# Patient Record
Sex: Female | Born: 1940 | Race: White | Hispanic: No | Marital: Married
Health system: Midwestern US, Community
[De-identification: ages and names within clinical notes are randomized; demographics above are authoritative.]

## PROBLEM LIST (undated history)

## (undated) DIAGNOSIS — Z981 Arthrodesis status: Secondary | ICD-10-CM

## (undated) DIAGNOSIS — M48061 Spinal stenosis, lumbar region without neurogenic claudication: Secondary | ICD-10-CM

## (undated) DIAGNOSIS — K219 Gastro-esophageal reflux disease without esophagitis: Secondary | ICD-10-CM

## (undated) DIAGNOSIS — K589 Irritable bowel syndrome without diarrhea: Secondary | ICD-10-CM

## (undated) DIAGNOSIS — D649 Anemia, unspecified: Secondary | ICD-10-CM

## (undated) DIAGNOSIS — M109 Gout, unspecified: Secondary | ICD-10-CM

## (undated) DIAGNOSIS — L509 Urticaria, unspecified: Secondary | ICD-10-CM

## (undated) DIAGNOSIS — H409 Unspecified glaucoma: Secondary | ICD-10-CM

## (undated) DIAGNOSIS — G4730 Sleep apnea, unspecified: Secondary | ICD-10-CM

## (undated) DIAGNOSIS — M1990 Unspecified osteoarthritis, unspecified site: Secondary | ICD-10-CM

## (undated) DIAGNOSIS — G894 Chronic pain syndrome: Secondary | ICD-10-CM

## (undated) DIAGNOSIS — G4700 Insomnia, unspecified: Secondary | ICD-10-CM

## (undated) DIAGNOSIS — I10 Essential (primary) hypertension: Secondary | ICD-10-CM

## (undated) DIAGNOSIS — R109 Unspecified abdominal pain: Secondary | ICD-10-CM

## (undated) DIAGNOSIS — C801 Malignant (primary) neoplasm, unspecified: Secondary | ICD-10-CM

## (undated) DIAGNOSIS — E569 Vitamin deficiency, unspecified: Secondary | ICD-10-CM

## (undated) DIAGNOSIS — K8020 Calculus of gallbladder without cholecystitis without obstruction: Secondary | ICD-10-CM

## (undated) DIAGNOSIS — E7800 Pure hypercholesterolemia, unspecified: Secondary | ICD-10-CM

## (undated) DIAGNOSIS — F209 Schizophrenia, unspecified: Secondary | ICD-10-CM

## (undated) DIAGNOSIS — J309 Allergic rhinitis, unspecified: Secondary | ICD-10-CM

## (undated) DIAGNOSIS — E669 Obesity, unspecified: Secondary | ICD-10-CM

## (undated) DIAGNOSIS — K5909 Other constipation: Secondary | ICD-10-CM

## (undated) DIAGNOSIS — K5900 Constipation, unspecified: Secondary | ICD-10-CM

## (undated) DIAGNOSIS — R110 Nausea: Secondary | ICD-10-CM

## (undated) DIAGNOSIS — F319 Bipolar disorder, unspecified: Secondary | ICD-10-CM

## (undated) DIAGNOSIS — H04123 Dry eye syndrome of bilateral lacrimal glands: Secondary | ICD-10-CM

## (undated) DIAGNOSIS — M4626 Osteomyelitis of vertebra, lumbar region: Secondary | ICD-10-CM

## (undated) DIAGNOSIS — R300 Dysuria: Secondary | ICD-10-CM

## (undated) HISTORY — DX: Nausea: R11.0

## (undated) HISTORY — PX: BACK SURGERY: SHX140

## (undated) HISTORY — DX: Pure hypercholesterolemia, unspecified: E78.00

## (undated) HISTORY — DX: Obesity, unspecified: E66.9

## (undated) HISTORY — DX: Constipation, unspecified: K59.00

## (undated) HISTORY — DX: Osteomyelitis of vertebra, lumbar region: M46.26

## (undated) HISTORY — PX: CHOLECYSTECTOMY: SHX55

## (undated) HISTORY — DX: Unspecified abdominal pain: R10.9

## (undated) HISTORY — DX: Irritable bowel syndrome, unspecified: K58.9

## (undated) HISTORY — PX: LEEP: SHX91

## (undated) HISTORY — DX: Unspecified glaucoma: H40.9

## (undated) HISTORY — PX: CARPAL TUNNEL RELEASE: SHX101

## (undated) HISTORY — PX: SKIN CANCER EXCISION: SHX779

## (undated) HISTORY — PX: TUBAL LIGATION: SHX77

## (undated) HISTORY — PX: ERCP: SHX60

## (undated) HISTORY — PX: BREAST SURGERY: SHX581

## (undated) HISTORY — DX: Calculus of gallbladder without cholecystitis without obstruction: K80.20

## (undated) HISTORY — PX: COLONOSCOPY W/ BIOPSIES: SHX1374

## (undated) NOTE — *Deleted (*Deleted)
[facility]  [address] [place],  [place]  [address] [phone]  Clinic Day:  01/26/2020  Referring physician: Paulina Fusi, MD   This document serves as a record of services personally performed by Gery Pray, MD. It was created on their behalf by [facility] E, a trained medical scribe. The creation of this record is based on the scribe's personal observations and the provider's statements to them.   CHIEF COMPLAINT:  CC: History of stage IA hormone receptor positive left breast cancer  Current Treatment:  Surveillance   HISTORY OF PRESENT ILLNESS:  Karen Castillo is a 25 y.o. 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  Biopsy revealed an infiltrating ductal carcinoma, tubular type.  Estrogen and progesterone receptors were positive and her 2 Neu negative.  She underwent excisional biopsy, but not a true lumpectomy in February 2013, and never had re-excision of the margins.  Pathology revealed a 5 mm, grade 1, lesion with 1 mm margin.  Re-excision and sentinel node biopsy was recommended, however she declined further surgery.  She was placed on letrozole 2.5 mg daily, however, was unable to tolerate this.  She is intolerant to many medications.  Bilateral diagnostic mammogram in December 2015 revealed postoperative scarring the left breast, as well as a new 3 mm benign appearing nodule in the right upper outer quadrant.  She had a repeat diagnostic right mammogram in July 2016, which was stable.  Bilateral diagnostic mammogram in November did not reveal any evidence of malignancy and follow-up in 1 year was recommended.  She had osteopenia on her last DEXA in in July 2016, which revealed a T-score of -1.8 in the femur, for which she is taking calcium and vitamin-D.  She had back surgery in February 2016, which did help her back pain.  She is here for routine  follow-up and states she has been doing fairly well.  She denies any changes in her breasts. She has persistent pain in her back and legs which she rates 7/10 today.  She states she also has labs done at Dr. Maurine Minister office, and we will request those.  She had her last mammogram on January 14, 2016.  She saw Dr. Charm Barges for Gastroenterology as she was having a lot of nausea and vomiting and inability to eat.  Upper endoscopy was okay and her last colonoscopy was in 2000. 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  She is doing better with that now, and did also have an ultrasound of the abdomen, which was negative.  She had told me that she had a chest mass 6-7 years ago which was evaluated at [facility] in [facility].  She later had a PET scan which was negative but has not had a repeat CT of the chest in years.  I ordered that to be done in June to follow up on this chest mass.  The CT scan confirmed a cyst along the right cardiophrenic angle measuring 3.6 cm, which is the same measurement as the previous report.  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    She is here for routine follow up and states that she is well and denies complaints.  She denies changes in either breast.  She rates her chronic back pain  as a 7/10, but denies pain at this time.  She follows up with Dr. Charm Barges for her GI issues.  Her appetite is good, but she has lost 7 pounds since her last visit.  She denies fever, or chills.  She denies nausea, vomiting, bowel issues, or abdominal pain.  She denies sore throat, cough, dyspnea, or chest pain.   INTERVAL HISTORY:  Illona is here for routine follow up ***.   Her  appetite is good, and she has gained/lost _ pounds since her last visit.  She denies fever, chills or other signs of infection.  She denies nausea, vomiting, bowel issues, or abdominal pain.   She denies sore throat, cough, dyspnea, or chest pain.   REVIEW OF SYSTEMS:  Review of Systems - Oncology   VITALS:  There were no vitals taken for this visit.  Wt Readings from Last 3 Encounters:  09/07/19 204 lb (92.5 kg)  08/23/19 204 lb 6 oz (92.7 kg)  07/21/19 204 lb (92.5 kg)    There is no height or weight on file to calculate BMI.  Performance status (ECOG): {CHL ONC [id number]  PHYSICAL EXAM:  Physical Exam  LABS:   CBC Latest Ref Rng & Units 07/21/2019 01/17/2019 01/16/2019  WBC 4.0 - 10.5 K/uL 6.6 8.1 8.2  Hemoglobin 12.0 - 15.0 g/dL 09.6 11.9(L) 11.6(L)  Hematocrit 36 - 46 % 37.1 36.2 35.9(L)  Platelets 150 - 400 K/uL 274 245 265   CMP Latest Ref Rng & Units 07/21/2019 01/17/2019 01/16/2019  Glucose 70 - 99 mg/dL 045(W) 97 098(J)  BUN 8 - 23 mg/dL 10 9 12   Creatinine 0.44 - 1.00 mg/dL 1.91 4.78 2.95  Sodium 135 - 145 mmol/L 133(L) 137 138  Potassium 3.5 - 5.1 mmol/L 3.7 3.9 3.7  Chloride 98 - 111 mmol/L 95(L) 103 105  CO2 22 - 32 mmol/L 25 24 22   Calcium 8.9 - 10.3 mg/dL 9.6 6.2(Z) 9.0  Total Protein 6.5 - 8.1 g/dL 7.0 6.5 -  Total Bilirubin 0.3 - 1.2 mg/dL 0.8 0.6 -  Alkaline Phos 38 - 126 U/L 66 82 -  AST 15 - 41 U/L 18 32 -  ALT 0 - 44 U/L 15 44 -     No results found for: CEA1 / No results found for: CEA1 No results found for: PSA1 No results found for: HYQ657 No results found for: QIO962  No results found for: TOTALPROTELP, ALBUMINELP, A1GS, A2GS, BETS, BETA2SER, GAMS, MSPIKE, SPEI No results found for: TIBC, FERRITIN, IRONPCTSAT No results found for: LDH   STUDIES:  No results found.   Allergies:  Allergies  Allergen Reactions  . Ciprofloxacin Swelling    Tongue swells  . Cyclobenzaprine Swelling    Feet swell   . Pregabalin Anaphylaxis, Shortness Of Breath, Swelling and Other (See Comments)    Dizziness also  . Fluvastatin Other (See Comments)    Leg pain   . Meloxicam Swelling    Feet became swollen  . Metaxalone Rash and Other  (See Comments)    Flushing of the skin and sore throat, too  . Pramipexole Nausea And Vomiting  . Pravastatin Other (See Comments)    Leg pain  . Rosuvastatin Other (See Comments)    Leg pain  . Sertraline Other (See Comments)    Sensitive teeth  . Statins Other (See Comments)    Leg pain     . Other     Darvocet  . Pravachol [Pravastatin Sodium]     Upper leg  pain  . Amitriptyline Other (See Comments)    Weird feeling all over  . Arthrotec [Diclofenac-Misoprostol] Diarrhea  . Augmentin [Amoxicillin-Pot Clavulanate] Diarrhea    Has patient had a PCN reaction causing immediate rash, facial/tongue/throat swelling, SOB or lightheadedness with hypotension: No Has patient had a PCN reaction causing severe rash involving mucus membranes or skin necrosis: No Has patient had a PCN reaction that required hospitalization: No Has patient had a PCN reaction occurring within the last 10 years: No If all of the above answers are "NO", then may proceed with Cephalosporin use.   . Bromfenac Diarrhea and Nausea Only  . Cephalexin Diarrhea and Nausea Only  . Conj Estrog-Medroxyprogest Ace Other (See Comments)    Unsure of reaction type Couldn't tolerate  . Diclofenac-Misoprostol Nausea Only and Diarrhea  . Erythromycin Diarrhea and Nausea Only  . Esomeprazole Diarrhea  . Esomeprazole Magnesium Nausea Only  . Levofloxacin Other (See Comments) and Nausea Only    Stomach upset  . Methadone Diarrhea  . Metoclopramide Diarrhea and Nausea Only  . Oxycodone Hives, Swelling and Rash  . Propoxyphene Diarrhea and Nausea Only  . Rofecoxib Diarrhea and Nausea Only  . Sulfur Diarrhea and Nausea Only  . Tramadol Diarrhea  . Zoloft [Sertraline Hcl] Other (See Comments)    Sensitivity with teeth    Current Medications: Current Outpatient Medications  Medication Sig Dispense Refill  . alendronate (FOSAMAX) 70 MG tablet Take 70 mg by mouth every Thursday. Take with a full glass of water on an empty  stomach.    Marland Kitchen allopurinol (ZYLOPRIM) 100 MG tablet Take 100 mg by mouth daily.    Marland Kitchen amLODipine (NORVASC) 10 MG tablet Take 10 mg by mouth daily.    Marland Kitchen ascorbic acid (VITAMIN C) 500 MG tablet Take 1,000 mg by mouth daily.    Marland Kitchen aspirin EC 81 MG tablet Take 81 mg by mouth daily.    . benzonatate (TESSALON) 100 MG capsule Take 100-200 mg by mouth every 8 (eight) hours as needed.    . Biotin 10 MG TABS Take 10 mg by mouth daily.    . calcium carbonate (OSCAL) 1500 (600 Ca) MG TABS tablet Take 600 mg of elemental calcium by mouth 3 (three) times daily with meals.    . Cholecalciferol (VITAMIN D3) 2000 units TABS Take 1 tablet by mouth daily.    . cloNIDine (CATAPRES) 0.1 MG tablet Take 0.1 mg by mouth at bedtime.    . cycloSPORINE (RESTASIS) 0.05 % ophthalmic emulsion Place 1 drop into both eyes 2 (two) times daily.     . fluconazole (DIFLUCAN) 100 MG tablet Take Fluconazole 200 mg for 1 day. Then followed by Fluconazole 100 mg daily for 10 days 12 tablet 0  . hydrochlorothiazide (HYDRODIURIL) 25 MG tablet Take 25 mg by mouth daily.    Marland Kitchen HYDROmorphone (DILAUDID) 2 MG tablet Take 2 mg by mouth in the morning, at noon, and at bedtime.     Marland Kitchen HYDROmorphone HCl (EXALGO) 8 MG TB24 Take 8 mg by mouth at bedtime.    . Lactobacillus (PROBIOTIC ACIDOPHILUS PO) Take 1 tablet by mouth daily.    . lansoprazole (PREVACID) 30 MG capsule Take 1 capsule (30 mg total) by mouth daily before breakfast. 30 minutes before breakfast 30 capsule 0  . latanoprost (XALATAN) 0.005 % ophthalmic solution Place 1 drop into both eyes at bedtime.    Marland Kitchen linaclotide (LINZESS) 290 MCG CAPS capsule Take 1 capsule (290 mcg total) by mouth daily before breakfast.  30 capsule 11  . LIVALO 2 MG TABS Take 1 tablet by mouth at bedtime.    . Magnesium (V-R MAGNESIUM) 250 MG TABS Take 250 mg by mouth daily.    . methocarbamol (ROBAXIN) 750 MG tablet Take 750 mg by mouth 3 (three) times daily.    . nadolol (CORGARD) 40 MG tablet Take 60 mg by mouth  daily.    Marland Kitchen NARCAN 4 MG/0.1ML LIQD nasal spray kit Place 4 mg into the nose. I spray in both nares PRN (Patient not taking: Reported on 08/23/2019)    . nitroGLYCERIN (NITROSTAT) 0.4 MG SL tablet Place 1 tablet (0.4 mg total) under the tongue every 5 (five) minutes as needed for chest pain. (Patient not taking: Reported on 09/07/2019) 30 tablet 0  . Omega-3 Fatty Acids (FISH OIL) 600 MG CAPS Take 600 mg by mouth 2 (two) times daily.    Marland Kitchen oxybutynin (DITROPAN) 5 MG tablet Take 5 mg by mouth 2 (two) times daily.    . polyethylene glycol (MIRALAX / GLYCOLAX) 17 g packet Take 17 g by mouth daily as needed for mild constipation.    Marland Kitchen spironolactone (ALDACTONE) 25 MG tablet Take 25 mg by mouth daily.    . sucralfate (CARAFATE) 1 GM/10ML suspension Take 10 mLs (1 g total) by mouth 4 (four) times daily -  with meals and at bedtime. 420 mL 0  . timolol (BETIMOL) 0.5 % ophthalmic solution Place 1 drop into both eyes daily. In the morning    . trazodone (DESYREL) 300 MG tablet Take 300 mg by mouth at bedtime.    Marland Kitchen trimethoprim (TRIMPEX) 100 MG tablet Take 100 mg by mouth daily.    . valsartan (DIOVAN) 80 MG tablet Take 80 mg by mouth daily.    . Wheat Dextrin (BENEFIBER) POWD Take 1 packet by mouth daily. Clear and Sugar FREE. 1 tablespoon daily     No current facility-administered medications for this visit.     ASSESSMENT & PLAN:   Assessment:   1. Stage IA left breast cancer,  over 8 years postop with no evidence of disease despite the fact that she had declined any further treatment after her excisional biopsy.  2. Osteopenia.  She will continue calcium 1200 mg daily and vitamin D3 2000 units daily.  I will recommend a repeat bone density scan in 1 year.  3. Chronic degenerative disc disease and chronic back pain.  She has had multiple(6) surgeries.  Her last 2 surgeries were May 2nd and June 20th 2019.  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  Plan: I can see her back  in 1 year with bilateral mammogram and bone density scan.  She understands and agrees with this plan of care.   I provided *** minutes (2:35 PM - 2:35 PM) of face-to-face time during this this encounter and > 50% was spent counseling as documented under my assessment and plan.    Dellia Beckwith, MD [facility] [address] [place] [address] Dept: [phone] Dept Fax: [phone]   I, Foye Deer, am acting as scribe for Dellia Beckwith, MD  I have reviewed this report as typed by the medical scribe, and it is complete and accurate.

---

## 2008-10-18 LAB — URINALYSIS W/ REFLEX CULTURE
Bacteria: NEGATIVE /HPF
Bilirubin: NEGATIVE
Blood: NEGATIVE
Glucose: NEGATIVE MG/DL
Ketone: NEGATIVE MG/DL
Leukocyte Esterase: NEGATIVE
Nitrites: NEGATIVE
Protein: NEGATIVE MG/DL
Specific gravity: 1.008 (ref 1.003–1.030)
Urobilinogen: 0.2 EU/DL (ref 0.2–1.0)
pH (UA): 6 (ref 5.0–8.0)

## 2008-10-18 LAB — METABOLIC PANEL, COMPREHENSIVE
A-G Ratio: 1.1 (ref 1.1–2.2)
ALT (SGPT): 28 U/L (ref 12–78)
AST (SGOT): 23 U/L (ref 15–37)
Albumin: 3.7 g/dL (ref 3.5–5.0)
Alk. phosphatase: 97 U/L (ref 50–136)
Anion gap: 10 mmol/L (ref 5–15)
BUN/Creatinine ratio: 10 — ABNORMAL LOW (ref 12–20)
BUN: 10 MG/DL (ref 6–20)
Bilirubin, total: 0.2 MG/DL (ref 0.2–1.0)
CO2: 30 MMOL/L (ref 21–32)
Calcium: 9.6 MG/DL (ref 8.5–10.1)
Chloride: 95 MMOL/L — ABNORMAL LOW (ref 97–108)
Creatinine: 1 MG/DL (ref 0.6–1.3)
GFR est AA: >60 mL/min/{1.73_m2} (ref >60)
GFR est non-AA: 59 mL/min/{1.73_m2} — ABNORMAL LOW (ref >60)
Globulin: 3.4 g/dL (ref 2.0–4.0)
Glucose: 110 MG/DL — ABNORMAL HIGH (ref 65–100)
Potassium: 4.4 MMOL/L (ref 3.5–5.1)
Protein, total: 7.1 g/dL (ref 6.4–8.2)
Sodium: 135 MMOL/L — ABNORMAL LOW (ref 136–145)

## 2008-10-18 LAB — CBC WITH AUTOMATED DIFF
ABS. BASOPHILS: 0 10*3/uL (ref 0.0–0.1)
ABS. EOSINOPHILS: 0.1 10*3/uL (ref 0.0–0.4)
ABS. LYMPHOCYTES: 2 10*3/uL (ref 0.8–3.5)
ABS. MONOCYTES: 0.6 10*3/uL (ref 0.0–1.0)
ABS. NEUTROPHILS: 5 10*3/uL (ref 1.8–8.0)
BASOPHILS: 0 % (ref 0–1)
EOSINOPHILS: 2 % (ref 0–7)
HCT: 38.8 % (ref 35.0–47.0)
HGB: 12.6 g/dL (ref 11.5–16.0)
LYMPHOCYTES: 26 % (ref 12–49)
MCH: 28.9 PG (ref 26.0–34.0)
MCHC: 32.5 g/dL (ref 30.0–36.5)
MCV: 89 FL (ref 80.0–99.0)
MONOCYTES: 8 % (ref 5–13)
NEUTROPHILS: 64 % (ref 32–75)
PLATELET: 350 10*3/uL (ref 150–400)
RBC: 4.36 M/uL (ref 3.80–5.20)
RDW: 13.6 % (ref 11.5–14.5)
WBC: 7.8 10*3/uL (ref 3.6–11.0)

## 2008-10-18 LAB — PTT: aPTT: 26.3 s (ref 24.0–33.0)

## 2008-10-18 LAB — PROTHROMBIN TIME + INR
INR: 1 (ref 0.9–1.1)
Prothrombin time: 10.1 SECS (ref 9.0–11.0)

## 2008-11-06 LAB — TYPE & SCREEN
ABO/Rh(D): O POS
Antibody screen: NEGATIVE

## 2008-11-06 LAB — TYPE AND SCREEN
ABO/Rh: O POS
Antibody Screen: NEGATIVE

## 2008-11-07 LAB — METABOLIC PANEL, BASIC
Anion gap: 11 mmol/L (ref 5–15)
BUN/Creatinine ratio: 8 — ABNORMAL LOW (ref 12–20)
BUN: 7 MG/DL (ref 6–20)
CO2: 27 MMOL/L (ref 21–32)
Calcium: 8.2 MG/DL — ABNORMAL LOW (ref 8.5–10.1)
Chloride: 96 MMOL/L — ABNORMAL LOW (ref 97–108)
Creatinine: 0.9 MG/DL (ref 0.6–1.3)
GFR est AA: >60 mL/min/{1.73_m2} (ref >60)
GFR est non-AA: >60 mL/min/{1.73_m2} (ref >60)
Glucose: 135 MG/DL — ABNORMAL HIGH (ref 65–100)
Potassium: 3.9 MMOL/L (ref 3.5–5.1)
Sodium: 134 MMOL/L — ABNORMAL LOW (ref 136–145)

## 2008-11-07 LAB — HGB & HCT
HCT: 30.4 % — ABNORMAL LOW (ref 35.0–47.0)
HGB: 10.1 g/dL — ABNORMAL LOW (ref 11.5–16.0)

## 2008-11-07 NOTE — Op Note (Addendum)
Name: Deanna Cortez, Deanna Cortez  MR #: [id number] Surgeon: Kem Parkinson, MD  Account #: [id number] Surgery Date: 11/06/2008  DOB: May 15, 1940  Age: 68 Location: 5W3 540 A     OPERATIVE REPORT      PREOPERATIVE DIAGNOSIS  1. Lumbar spinal fusion, L3-4.  2. Lumbar spondylosis, L4-5.  3. Lumbar stenosis at L2-3, L3-4, L4-5.    POSTOPERATIVE DIAGNOSIS  1. Lumbar spinal fusion, L3-4.  2. Lumbar spondylosis, L4-5.  3. Lumbar stenosis at L2-3, L3-4, L4-5.    OPERATIVE PROCEDURE  1. Lumbar laminectomy, L2-3, L3-4, L4-5.  2. Posterior lumbar fusion, L3-4, L4-5, posterior segmental  instrumentation, L3-4, L4-5.  3. Local autogenous bone graft, as well as bone morphogenic protein  allograft posterolaterally at L3-4 and L4-5.    SURGEON: Mindi Akerson S. Raylene Miyamoto, MD    ASSISTANT: Kerby Moors, PA-C    COMPLICATIONS: None.    DRAINS: One medium Hemovac.    ESTIMATED BLOOD LOSS: 300 mL.    SPECIMENS: None.    INDICATIONS: This is a pleasant 68 year old female with progressive back  pain, as well as neurogenic claudication. The patient has known spinal  stenosis from L2 down to L5. She has this for several years, treated  successfully with epidural steroid injections. This has become recently  less successful. Because of decreasing walking tolerance and increasing  left leg pain, she agreed to proceed with surgical intervention. The risks  and benefits of the procedure were discussed with her and her husband and  she has elected to proceed.    PROCEDURE: The patient was identified, brought to the operating suite,  underwent general anesthesia without difficulty. A Foley catheter was  placed. Ancef 2 gm were given to the patient preoperatively. Preoperative  neuromonitoring was placed, with the baselines obtained. The patient was  placed in the prone position on the Wilson frame with all bony prominences  well padded. The back was prepped and draped sterilely.    The back was entered through a midline incision at approximately the L2   level down to the S1 level. Dissection was carried down to the  thoracodorsal fascia. Hemostasis with bipolar cautery. The fascia was  released bilaterally, exposing the posterior lamina of L5, L4, L3 and  inferior portion of L2. X-ray was taken for localization of the appropriate  level. At that time, the facets were examined and posterolateral exposure  performed, with exposure of the L3, L4 and L5 transverse processes  bilaterally. Soft tissue was cleaned out of the lateral gutters in  preparation for the posterolateral fusion. Soft tissue was also removed off  the facets and these were found to be hypertrophic, as well as  significantly spondylitic and unstable, particularly at L4-5, with  significant synovial cyst fluid formation noted. At that time central  laminectomy was started, with removal of L5, L4 and L3 centrally at the  spinous processes. These were morselized, to be mixed with allograft.  Central laminectomy was started at the L5 level with #3 and #4 Kerrisons.  This was carried down through the L2-3 interval, with removal of the  intervening ligamentum flavum. Moderate central stenosis was noted. Severe  lateral recess stenosis was noted at L3-4 and L4-5 intervals bilaterally.  Careful dissection was carried out bilaterally, with undercutting of the  partial medial facets, as well as the superior articular process.  Pedicle-to-pedicle decompression was performed from the L3 all the way down  to the L5-S1 segment. This was done bilaterally. After satisfactory  decompression the wound was  irrigated with pulsed lavage with bacitracin  3000 mL.    Under direct visualization, using standard anatomical landmarks, the  enlarged pedicles of L3, L4 and L5 were all cannulated and were entered.  These were tested up to 20 mA with no breaches noted. This was also  palpated on all four walls, with no breaches noted with the pedicle probe.  X-rays were taken for localization of the pedicle, placed in the   appropriate alignment. Lateral gutters were decorticated. The mixture of  BMP, as well as the allograft/autograft mixture was placed into the lateral  gutters. Then, 6.5 x 45 mm screws were placed at L3, tested with no breach  at 20 mA and 6.5 x 40 mm screws were placed at L4 and L5, again, with no  breaches up to 12 mA. The 50-mm rods were bent to the appropriate lordosis,  attached to the pedicle screws and facet screws. Final tightening was  performed. One cross connector between the L3 and L4 level. Neuromonitoring  was stable. Final x-rays demonstrated stable fixation. Two medium Hemovacs  were placed.    Interrupted 1 Vicryl in the fascial layer, 2-0 Vicryl subcutaneous layer  and 3-0 Vicryl in the skin. The patient was extubated, was stable and  returned to PACU in stable condition.        E-Signed By  Kem Parkinson, MD 11/26/2008 20:32  Kem Parkinson, MD    cc: Kem Parkinson, MD        JV/WMX; D: 11/07/2008 7:58 A; T: 11/07/2008 10:31 A; Doc# [id number]; Job#  [id number]

## 2008-11-07 NOTE — Op Note (Signed)
Op Notes filed by Kem Parkinson, MD at 11/26/08 2034                Author: Kem Parkinson, MD  Service: --  Author Type: Physician       Filed: 11/26/08 2034  Date of Service: 11/07/08 1031  Status: Addendum          Editor: Kem Parkinson, MD (Physician)          <!--EPICS--> Name:      Deanna Cortez, Deanna Cortez<BR> MR #:      [id number]                    Surgeon:        Kem Parkinson, MD<BR> Account #: [id number]                  Surgery Date:   11/06/2008<BR> DOB:       January 30, 1941<BR> Age:       68                           Location:       5W3 540 A<BR> <BR>                              OPERATIVE REPORT<BR> <BR> <BR> PREOPERATIVE DIAGNOSIS<BR> 1. Lumbar spinal  fusion, L3-4.<BR> 2. Lumbar spondylosis, L4-5.<BR> 3. Lumbar stenosis at L2-3, L3-4, L4-5.<BR> <BR> POSTOPERATIVE DIAGNOSIS<BR> 1. Lumbar spinal fusion, L3-4.<BR> 2. Lumbar spondylosis, L4-5.<BR> 3. Lumbar stenosis at L2-3, L3-4, L4-5.<BR> <BR> OPERATIVE  PROCEDURE<BR> 1. Lumbar laminectomy, L2-3, L3-4, L4-5.<BR> 2. Posterior lumbar fusion, L3-4, L4-5, posterior segmental<BR> instrumentation, L3-4, L4-5.<BR> 3. Local autogenous bone graft, as well as bone morphogenic protein<BR> allograft posterolaterally  at L3-4 and L4-5.<BR> <BR> SURGEON:  Arthur Aydelotte S. Ashante Snelling, MD<BR> <BR> ASSISTANT: Kerby Moors, PA-C<BR> <BR> COMPLICATIONS:  None.<BR> <BR> DRAINS:  One medium Hemovac.<BR> <BR> ESTIMATED BLOOD LOSS:  300 mL.<BR> <BR> SPECIMENS:  None.<BR> <BR> INDICATIONS:   This is a pleasant 68 year old female with progressive back<BR> pain, as well as neurogenic claudication. The patient has known spinal<BR> stenosis from L2 down to L5. She has this for several years, treated<BR> successfully with epidural steroid injections.  This has become recently<BR> less successful. Because of decreasing walking tolerance and increasing<BR> left leg pain, she agreed to proceed with surgical intervention. The risks<BR> and benefits of the procedure were discussed  with her and her husband  and<BR> she has elected to proceed.<BR> <BR> PROCEDURE:  The patient was identified, brought to the operating suite,<BR> underwent general anesthesia without difficulty. A Foley catheter was<BR> placed. Ancef 2 gm were given to the patient preoperatively.  Preoperative<BR> neuromonitoring was placed, with the baselines obtained. The patient was<BR> placed in the prone position on the Wilson frame with all bony prominences<BR> well padded. The back was prepped and draped sterilely.<BR> <BR> The back was  entered through a midline incision at approximately the L2<BR> level down to the S1 level. Dissection was carried down to the<BR> thoracodorsal fascia. Hemostasis with bipolar cautery. The fascia was<BR> released bilaterally, exposing the posterior lamina  of L5, L4, L3 and<BR> inferior portion of L2. X-ray was taken for localization of the appropriate<BR> level. At that time, the facets were examined and posterolateral exposure<BR> performed, with exposure of the L3, L4 and L5 transverse processes<BR>  bilaterally. Soft tissue was cleaned out of the lateral gutters in<BR> preparation for the posterolateral fusion. Soft tissue was  also removed off<BR> the facets and these were found to be hypertrophic, as well as<BR> significantly spondylitic and unstable,  particularly at L4-5, with<BR> significant synovial cyst fluid formation noted. At that time central<BR> laminectomy was started, with removal of L5, L4 and L3 centrally at the<BR> spinous processes. These were morselized, to be mixed with allograft.<BR>  Central laminectomy was started at the L5 level with #3 and #4 Kerrisons.<BR> This was carried down through the L2-3 interval, with removal of the<BR> intervening ligamentum flavum. Moderate central stenosis was noted. Severe<BR> lateral recess stenosis  was noted at L3-4 and L4-5 intervals bilaterally.<BR> Careful dissection was carried out bilaterally, with undercutting of the<BR>  partial medial facets, as well as the superior articular process.<BR> Pedicle-to-pedicle decompression was performed from  the L3 all the way down<BR> to the L5-S1 segment. This was done bilaterally. After satisfactory<BR> decompression the wound was irrigated with pulsed lavage with bacitracin<BR> 3000 mL.<BR> <BR> Under direct visualization, using standard anatomical landmarks,  the<BR> enlarged pedicles of L3, L4 and L5 were all cannulated and were entered.<BR> These were tested up to 20 mA with no breaches noted. This was also<BR> palpated on all four walls, with no breaches noted with the pedicle probe.<BR> X-rays were taken  for localization of the pedicle, placed in the<BR> appropriate alignment. Lateral gutters were decorticated. The mixture of<BR> BMP, as well as the allograft/autograft mixture was placed into the lateral<BR> gutters. Then, 6.5 x 45 mm screws were placed  at L3, tested with no breach<BR> at 20 mA and 6.5 x 40 mm screws were placed at L4 and L5, again, with no<BR> breaches up to 12 mA. The 50-mm rods were bent to the appropriate lordosis,<BR> attached to the pedicle screws and facet screws. Final tightening  was<BR> performed. One cross connector between the L3 and L4 level. Neuromonitoring<BR> was stable. Final x-rays demonstrated stable fixation. Two medium Hemovacs<BR> were placed.<BR> <BR> Interrupted 1 Vicryl in the fascial layer, 2-0 Vicryl subcutaneous  layer<BR> and 3-0 Vicryl in the skin. The patient was extubated, was stable and<BR> returned to PACU in stable condition.<BR> <BR> <BR> <BR> E-Signed By<BR> Kem Parkinson, MD 11/26/2008 20:32<BR> Marcey Persad, MD<BR> <BR> cc:   Kem Parkinson,  MD<BR> <BR> <BR> <BR> JV/WMX; D: 11/07/2008  67:58 A; T: 11/07/2008 10:31 A; Doc# [id number]; Job#<BR> [id number]<KG> <!--EPICE-->

## 2008-11-08 LAB — METABOLIC PANEL, BASIC
Anion gap: 8 mmol/L (ref 5–15)
BUN/Creatinine ratio: 10 — ABNORMAL LOW (ref 12–20)
BUN: 6 MG/DL (ref 6–20)
CO2: 31 MMOL/L (ref 21–32)
Calcium: 8.3 MG/DL — ABNORMAL LOW (ref 8.5–10.1)
Chloride: 95 MMOL/L — ABNORMAL LOW (ref 97–108)
Creatinine: 0.6 MG/DL (ref 0.6–1.3)
GFR est AA: >60 mL/min/{1.73_m2} (ref >60)
GFR est non-AA: >60 mL/min/{1.73_m2} (ref >60)
Glucose: 112 MG/DL — ABNORMAL HIGH (ref 65–100)
Potassium: 3.5 MMOL/L (ref 3.5–5.1)
Sodium: 134 MMOL/L — ABNORMAL LOW (ref 136–145)

## 2008-11-08 LAB — HGB & HCT
HCT: 26.6 % — ABNORMAL LOW (ref 35.0–47.0)
HGB: 8.7 g/dL — ABNORMAL LOW (ref 11.5–16.0)

## 2008-11-10 NOTE — Consults (Addendum)
Name: Deanna Cortez, Deanna Cortez Admitted: 11/06/2008  MR #: [id number] DOB: September 20, 1940  Account #: [id number] Age: 68  Consultant: Felix Ahmadi, MD Location: [phone] [id number]     CONSULTATION REPORT    DATE OF CONSULTATION: 11/10/2008      ATTENDING PHYSICIAN: Jed S. Vanichkachorn, MD    REASON FOR CONSULTATION: We are being asked to render an opinion regarding  the patient's rehab needs.    CHIEF COMPLAINT: Back pain, leg cramps.    HISTORY OF PRESENT ILLNESS: The patient is a 68 year old white female with  chronic back pain for several years. She has had the gamut of treatments,  including physical therapy, oral medications, and steroid injections. She  has also received multiple steroid injections of the greater trochanter, as  this was initially thought to be the cause of her leg pains.The patient  reports that pain progressively worsened since January 2010, radiating to  both lower extremities, more on the left side. She denies any numbness or  weakness. No bowel or bladder changes. No associated trauma. Imaging  studies showed multilevel lumbosacral stenosis.  She was admitted to [facility] and on 11/06/2008, underwent L2 to  L5 laminectomy and instrumented posterior spinal fusion. Postoperative  course was significant for a severe headache, which has since resolved with  Percocet. She also had increased bilateral lower extremity cramps which are  still present, but improved this morning. She continues to have back pain  radiating to the lower extremities, but with less frequency and intensity.  Back pain is currently 4/10, described as deep and aching, and decreased  with medication. She does have decreased hemoglobin and hematocrit, but is  asymptomatic, and blood pressure has been stable.    PAST MEDICAL HISTORY: In addition to HPI, chronic low back pain,  headaches, hypertension, dyslipidemia, obstructive sleep apnea, on CPAP.  Depression, anxiety disorder, glaucoma, GERD, irritable bowel syndrome,   history of cervical cancer, history of skin cancer, carpal tunnel syndrome,  cholecystectomy.    FAMILY HISTORY: Positive for hypertension. Father had prostate cancer.    SOCIAL HISTORY: The patient lives with her husband in a 1-story home with  2 to 3 entry steps. She denies smoking, occasional intake of alcohol,  denies illicit drug use.    FUNCTIONAL HISTORY: Prior to admission, the patient independent in ADLs  and ambulated without an assistive device. As of 11/09/2008, lower body  ADLs with adaptive equipment. Minimal assistance, bed mobility, and  sit-to-stand transfers. Contact guard to minimal assistance, ambulated 35  feet x2 with a rolling walker, contact guard assistance.    ALLERGIES: MULTIPLE, INCLUDING AMITRIPTYLINE, CELEBREX, MOBIC, VIOXX,  DARVOCET, PRAVACHOL, SIMVASTATIN, LESCOL, CRESTOR, KEFLEX, AND AUGMENTIN,  ZOLOFT, ERYTHROMYCIN, METAXALONE, NEXIUM, PREMPRO.    HOME MEDICATIONS  1. Lisinopril 40 mg once daily.  2. Atenolol 100 mg once daily.  3. Triamterene/hydrochlorothiazide 37.5/25 mg once daily.  4. Omeprazole 20 mg b.i.d.  5. Ambien 10 mg daily.  6. Citalopram 40 mg daily.  7. Alprazolam 0.5 mg daily.  8. Probiotic 4 mg daily.  9. Oxycodone-APAP 5/325 mg 1 to 2 tablets daily as needed.  10. Estradiol 1 mg p.o. daily.    CURRENT MEDICATIONS  1. Bimatoprost 0.03% to both eyes at bedtime.  2. Timolol 0.5% to both eyes daily.  3. Estradiol 1 mg p.o. daily.  4. Flora-Q p.o. daily.  5. Alprazolam 0.5 mg p.o. daily.  6. Citalopram 40 mg p.o. daily.  7. Zolpidem 10 mg p.o. at bedtime.  8.  Pantoprazole 40 mg p.o. daily.  9. Triamterene/hydrochlorothiazide 37.5/25 mg p.o. daily.  10. Atenolol 100 mg p.o. at bedtime.  11. Lisinopril 40 mg p.o. daily.  12. Methocarbamol 1000 mg p.o. q.6 hours p.r.n.  13. Diazepam 5 mg p.o. q.6 hours p.r.n.  14. Enalapril 0.4 mg IV p.r.n.  15. Acetaminophen 325 to 650 mg p.o. q.4 hours p.r.n.  16. Temazepam 15 mg p.o. at bedtime p.r.n.   17. Bisacodyl suppository daily p.r.n.  18. Milk of magnesia p.o. p.r.n.  19. Metoclopramide 10 mg IV q.6 hours p.r.n.  20. Ondansetron 4 mg IV q.6 hours p.r.n.  21. Multivitamins with minerals daily p.r.n.  22. Senokot-S daily p.r.n.  23. Hydrocodone-APAP 5-10 mg p.o. q.3h. p.r.n.  24. Oxycodone 5 to 10 mg p.o. q.3 hours p.r.n.    REVIEW OF SYSTEMS: No fever or chills. No appetite the last few days, but  feels hungry this morning. Slept well for the first time last night after  zolpidem. No changes in vision. Denies difficulty swallowing. No chest pain  or shortness of breath. No abdominal pain, nausea, or vomiting, is passing  gas, and feels like having a bowel movement this morning. Denies dysuria or  hematuria. No history of bleeding recently. Depression and anxiety stable.  No skin rash. No significant weight loss, weight gain. Rest of review of  systems per HPI.    PHYSICAL EXAMINATION  GENERAL: The patient is alert, not in acute distress.  VITAL SIGNS: Blood pressure 151/69, pulse 74, respiratory 16, temperature  98 degrees Fahrenheit, on room air.  SKIN: Lumbar incision covered by dry, clean, dressing, with no drainage on  the dressing.  HEENT: Pupils equal, reactive to light. Anicteric sclerae. Oropharynx is  clear.  NECK: Supple.  LUNGS: Clear to auscultation bilaterally. No wheezing.  HEART: Regular rate and rhythm. No murmurs.  ABDOMEN: Soft, nontender, with normoactive bowel sounds.  PSYCHIATRIC: Times 3, mood and affect are appropriate. Cognition and  memory intact. Follows all verbal directives appropriately.  MUSCULOSKELETAL: No cervical or thoracic tenderness. Calves are soft. Very  mildly tender, but no rope-like mass or erythema. No lower extremity edema.  Range of motion within functional limits for all extremities.  NEUROLOGIC: Speech clear and fluent. Cranial nerves 2-12 intact. Sensory  intact to light touch in all extremities. Motor strength 5/5 bilateral,   upper and lower extremities, except hip flexors, 4+/5 with some pain  inhibition. Negative Babinski's, ankle clonus, or Hoffmann reflex. Tone is  normal.    DIAGNOSTICS: On 11/08/2008, hemoglobin 8.7, hematocrit 26.6, sodium 134,  potassium 3.5, chloride 95, carbon dioxide 31, glucose 112, BUN 6,  creatinine 0.6, calcium 8.3.    IMPRESSION  1. Nerve root dysfunction, secondary to multi-level lumbosacral stenosis,  status post L2 to L5 laminectomy, and posterior spinal fusion, 11/06/2008.  2. Acute blood-loss anemia.  3. Bilateral lower extremity cramps. May be related to postoperative  phenomenon, or ongoing, but improved, radiculitis.  4. Chronic low back pain.  5. Headaches. ? etiology.  6. Hypertension.  7. Dyslipidemia.  8. Obstructive sleep apnea, on CPAP.  9. Depression and anxiety disorder.  10. Glaucoma.  11. Gastroesophageal reflux disease.  12. Irritable bowel syndrome.    RECOMMENDATIONS  1. The patient is appropriate for acute inpatient rehabilitation to  maximize function, and manage medical comorbidities. The goal is for her to  obtain functional level of supervision to modify the independence in ADLs,  transfers, and ambulation. Expected length of stay is about 5 to 7 days.  Various rehab options and programs discussed with the patient and her  husband.  2. Discontinue temazepam as zolpidem has been effective for sleep  regulation.    Thank you for this consultation.    Discussed with case management, therapy and nursing staff .            Reviewed on 11/10/2008 1:21 PM        E-Signed By  Felix Ahmadi, MD 11/10/2008  13:41    Felix Ahmadi, MD    cc: Felix Ahmadi, MD        MCL/WMX; D: 11/10/2008 9:35 A; T: 11/10/2008 11:25 A; DOC# [id number]; Job#  [id number]

## 2008-11-11 LAB — METABOLIC PANEL, BASIC
Anion gap: 10 mmol/L (ref 5–15)
BUN/Creatinine ratio: 9 — ABNORMAL LOW (ref 12–20)
BUN: 8 MG/DL (ref 6–20)
CO2: 30 MMOL/L (ref 21–32)
Calcium: 8.5 MG/DL (ref 8.5–10.1)
Chloride: 92 MMOL/L — ABNORMAL LOW (ref 97–108)
Creatinine: 0.9 MG/DL (ref 0.6–1.3)
GFR est AA: >60 mL/min/{1.73_m2} (ref >60)
GFR est non-AA: >60 mL/min/{1.73_m2} (ref >60)
Glucose: 96 MG/DL (ref 65–100)
Potassium: 3.7 MMOL/L (ref 3.5–5.1)
Sodium: 132 MMOL/L — ABNORMAL LOW (ref 136–145)

## 2008-11-11 LAB — CBC W/O DIFF
HCT: 30.4 % — ABNORMAL LOW (ref 35.0–47.0)
HGB: 9.9 g/dL — ABNORMAL LOW (ref 11.5–16.0)
MCH: 28.4 PG (ref 26.0–34.0)
MCHC: 32.6 g/dL (ref 30.0–36.5)
MCV: 87.1 FL (ref 80.0–99.0)
PLATELET: 393 10*3/uL (ref 150–400)
RBC: 3.49 M/uL — ABNORMAL LOW (ref 3.80–5.20)
RDW: 13.4 % (ref 11.5–14.5)
WBC: 9.7 10*3/uL (ref 3.6–11.0)

## 2008-11-12 LAB — URINALYSIS W/ RFLX MICROSCOPIC
Bilirubin: NEGATIVE
Blood: NEGATIVE
Glucose: NEGATIVE MG/DL
Ketone: NEGATIVE MG/DL
Nitrites: POSITIVE — AB
Protein: NEGATIVE MG/DL
Specific gravity: 1.008 (ref 1.003–1.030)
Urobilinogen: 0.2 EU/DL (ref 0.2–1.0)
pH (UA): 6.5 (ref 5.0–8.0)

## 2008-11-13 LAB — METABOLIC PANEL, BASIC
Anion gap: 7 mmol/L (ref 5–15)
BUN/Creatinine ratio: 9 — ABNORMAL LOW (ref 12–20)
BUN: 7 MG/DL (ref 6–20)
CO2: 31 MMOL/L (ref 21–32)
Calcium: 8.2 MG/DL — ABNORMAL LOW (ref 8.5–10.1)
Chloride: 95 MMOL/L — ABNORMAL LOW (ref 97–108)
Creatinine: 0.8 MG/DL (ref 0.6–1.3)
GFR est AA: >60 mL/min/{1.73_m2} (ref >60)
GFR est non-AA: >60 mL/min/{1.73_m2} (ref >60)
Glucose: 86 MG/DL (ref 65–100)
Potassium: 4.2 MMOL/L (ref 3.5–5.1)
Sodium: 133 MMOL/L — ABNORMAL LOW (ref 136–145)

## 2008-11-14 LAB — CULTURE, URINE
Colonies Counted: >100000
Colony Count: >100000

## 2008-11-15 LAB — CULTURE, WOUND W GRAM STAIN: Culture result:: NO GROWTH

## 2008-11-17 LAB — METABOLIC PANEL, BASIC
Anion gap: 7 mmol/L (ref 5–15)
BUN/Creatinine ratio: 10 — ABNORMAL LOW (ref 12–20)
BUN: 9 MG/DL (ref 6–20)
CO2: 31 MMOL/L (ref 21–32)
Calcium: 8.8 MG/DL (ref 8.5–10.1)
Chloride: 97 MMOL/L (ref 97–108)
Creatinine: 0.9 MG/DL (ref 0.6–1.3)
GFR est AA: >60 mL/min/{1.73_m2} (ref >60)
GFR est non-AA: >60 mL/min/{1.73_m2} (ref >60)
Glucose: 92 MG/DL (ref 65–100)
Potassium: 4.5 MMOL/L (ref 3.5–5.1)
Sodium: 135 MMOL/L — ABNORMAL LOW (ref 136–145)

## 2008-11-17 LAB — MAGNESIUM: Magnesium: 2.1 MG/DL (ref 1.6–2.4)

## 2008-11-17 LAB — CBC W/O DIFF
HCT: 28.9 % — ABNORMAL LOW (ref 35.0–47.0)
HGB: 9.2 g/dL — ABNORMAL LOW (ref 11.5–16.0)
MCH: 28 PG (ref 26.0–34.0)
MCHC: 31.8 g/dL (ref 30.0–36.5)
MCV: 87.8 FL (ref 80.0–99.0)
PLATELET: 566 10*3/uL — ABNORMAL HIGH (ref 150–400)
RBC: 3.29 M/uL — ABNORMAL LOW (ref 3.80–5.20)
RDW: 14 % (ref 11.5–14.5)
WBC: 8.9 10*3/uL (ref 3.6–11.0)

## 2009-01-04 NOTE — Discharge Summary (Addendum)
Name: Deanna Cortez, Deanna Cortez Admitted: 11/06/2008  MR #: [id number] Discharged: 11/10/2008  Account #: [id number] DOB: [birth year]  Physician: Kem Parkinson, MD Age 68     DISCHARGE SUMMARY    ADMISSION DIAGNOSES  1. 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  2. Lumbar spondylosis L4-5.    DISCHARGE DIAGNOSES  1. 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  2. Lumbar spondylosis L4-5.    OPERATIVE PROCEDURES  1. Lumbar laminectomy L2-3, L3-4, L4-5.  2. Posterior lumbar fusion at L3-4, L4-5.  3. Posterior segmental instrumentation L3 to L5.  4. Local autogenous bone graft, as well as BNP.    SURGEON: Kem Parkinson, MD.    HISTORY/HOSPITAL COURSE: Patient is a pleasant 68 year old female who has  complaints of progressive lower back pain, as well as neurogenic  claudication. Having failed conservative treatment, the patient has elected  to undergo operative intervention. She underwent the above mentioned  procedures and tolerated them well. She was admitted postoperatively for  antibiotic treatment and pain control. The patient did well overnight into  the first postoperative day. She had no complaints on postop day 1. She was  afebrile and her vital signs were stable. Hemovac output was about 170 mL  and so it was kept in place. IV antibiotics were continued. The patient's  hemoglobin was stable at 10.1. Her incision was clean, dry and intact. She  was allowed to start dangling on the edge of the bed with physical therapy.  On postoperative day 2 the patient continued to be fairly well. Hemovac  output was about 50 mL. Hemoglobin had dropped to 8.7, but otherwise was  stable and the patient was asymptomatic. She had no active pain and was  doing fairly well. She was allowed to start getting out of bed with  physical therapy. On postop day 2 patient was complaining of some spasm,  but otherwise had no complaints. She continued working with the physical  therapist. Her diet was advanced as tolerated. Her morphine PCA was taken   away once she was tolerating a regular diet. On postoperative day 4 she was  deemed ready for discharge. It was decided that she would benefit from  inpatient rehab. She was accepted at [facility]. She was  discharged there with prescriptions for Percocet for pain and Phenergan for  nausea. She will wear her brace at all times when out of bed. She will  follow up in 10 to 14 days for repeat x-rays and wound check.          E-Signed By  Kem Parkinson, MD 01/04/2009 22:24    Kem Parkinson, MD    cc: Kem Parkinson, MD    JV/WMX; D: 01/03/2009 11:20 P; T: 01/04/2009 2:34 P; DOC# [id number]; Job#  [id number]

## 2010-03-26 DIAGNOSIS — Q248 Other specified congenital malformations of heart: Secondary | ICD-10-CM | POA: Insufficient documentation

## 2010-03-26 HISTORY — DX: Other specified congenital malformations of heart: Q24.8

## 2011-03-11 HISTORY — PX: BREAST BIOPSY: SHX20

## 2011-04-24 DIAGNOSIS — C50912 Malignant neoplasm of unspecified site of left female breast: Secondary | ICD-10-CM | POA: Insufficient documentation

## 2011-04-24 HISTORY — DX: Malignant neoplasm of unspecified site of left female breast: C50.912

## 2013-04-20 DIAGNOSIS — M5430 Sciatica, unspecified side: Secondary | ICD-10-CM

## 2013-04-20 LAB — CBC W/O DIFF
HCT: 37.2 % (ref 35.0–47.0)
HGB: 11.9 g/dL (ref 11.5–16.0)
MCH: 28.3 PG (ref 26.0–34.0)
MCHC: 32 g/dL (ref 30.0–36.5)
MCV: 88.4 FL (ref 80.0–99.0)
PLATELET: 299 10*3/uL (ref 150–400)
RBC: 4.21 M/uL (ref 3.80–5.20)
RDW: 14.5 % (ref 11.5–14.5)
WBC: 7.7 10*3/uL (ref 3.6–11.0)

## 2013-04-20 LAB — METABOLIC PANEL, BASIC
Anion gap: 7 mmol/L (ref 5–15)
BUN/Creatinine ratio: 29 — ABNORMAL HIGH (ref 12–20)
BUN: 17 MG/DL (ref 6–20)
CO2: 32 mmol/L (ref 21–32)
Calcium: 9 MG/DL (ref 8.5–10.1)
Chloride: 97 mmol/L (ref 97–108)
Creatinine: 0.59 MG/DL (ref 0.45–1.15)
GFR est AA: >60 mL/min/{1.73_m2} (ref >60)
GFR est non-AA: >60 mL/min/{1.73_m2} (ref >60)
Glucose: 114 mg/dL — ABNORMAL HIGH (ref 65–100)
Potassium: 3.6 mmol/L (ref 3.5–5.1)
Sodium: 136 mmol/L (ref 136–145)

## 2013-04-20 LAB — URINALYSIS W/ REFLEX CULTURE
Bilirubin: NEGATIVE
Blood: NEGATIVE
Glucose: NEGATIVE mg/dL
Ketone: NEGATIVE mg/dL
Nitrites: NEGATIVE
Protein: NEGATIVE mg/dL
Specific gravity: 1.012 (ref 1.003–1.030)
Urobilinogen: 0.2 EU/dL (ref 0.2–1.0)
pH (UA): 6.5 (ref 5.0–8.0)

## 2013-04-20 LAB — TYPE & SCREEN
ABO/Rh(D): O POS
Antibody screen: NEGATIVE

## 2013-04-20 LAB — EKG, 12 LEAD, INITIAL
Atrial Rate: 68 {beats}/min
Calculated P Axis: 6 degrees
Calculated R Axis: 54 degrees
Calculated T Axis: 17 degrees
P-R Interval: 178 ms
Q-T Interval: 434 ms
QRS Duration: 100 ms
QTC Calculation (Bezet): 461 ms
Ventricular Rate: 68 {beats}/min

## 2013-04-20 LAB — PROTHROMBIN TIME + INR
INR: 1 (ref 0.9–1.1)
Prothrombin time: 10 s (ref 9.0–11.1)

## 2013-04-20 LAB — HEMOGLOBIN A1C WITH EAG
Est. average glucose: 160 mg/dL
Hemoglobin A1c: 7.2 % — ABNORMAL HIGH (ref 4.2–6.3)

## 2013-04-20 LAB — TYPE AND SCREEN
ABO/Rh: O POS
Antibody Screen: NEGATIVE

## 2013-04-20 NOTE — Other (Signed)
PATIENT DENIES TRAVEL TO [place] OR ANY AREA WHERE EVD TRANSMISSION HAS BEEN REPORTED BY WHO AND DENIES CLOSE CONTACT WITH ANYONE TRAVELING TO THOSE AREAS IN THE PAST 21 DAYS.PATIENT GIVEN SURGICAL SITE INFECTION FAQ HANDOUT AND HAND WASHING TIP SHEET. PREOP INSTRUCTIONS REVIEWED AND PATIENT VERBALIZES UNDERSTANDING OF INSTRUCTIONS. PATIENT HAS BEEN GIVEN THE OPPORTUNITY TO ASK ADDITIONAL QUESTIONS.PREOPERATIVE INSTRUCTIONS REVIEWED WITH PATIENT. PATIENT GIVEN SIX PACK OF CHG WIPES. INSTRUCTIONS (REVIEWED IN CLASS) ON USE OF CHG WIPES. PATIENT GIVEN SSI INFECTION FAQ SHEET, INFORMATION SHEET ON DIABETIC TREATMENT CENTER AS WELL AS HAND WASHING TIPS SHEETS. MRSA/MSSA TREATMENT INSTRUCTION SHEET GIVEN WITH EXPLANATION TO PATIENT THAT THEY WILL BE NOTIFIED IF TREATMENT INSTRUCTIONS NEED TO BE INITIATED. PATIENT WAS GIVEN THE OPPORTUNITY TO ASK QUESTIONS ON THE INFORMATION PROVIDED.

## 2013-04-20 NOTE — Other (Signed)
CLASS PT; LABS DRAWN PRIOR TO CLASS.

## 2013-04-21 LAB — CULTURE, MRSA

## 2013-04-21 NOTE — Other (Signed)
LABS CALLED AND FAXED TO [facility], DR. VANICHKACHORN'S NURSE. [facility]TC REFERRAL PLACED FOR A1C 7.2.

## 2013-04-22 LAB — CULTURE, URINE
Colonies Counted: 40000
Colony Count: 40000

## 2013-04-25 NOTE — Other (Signed)
FAXED PRE-ADMISSION TESTING REPORTS (AND FAX CONFIRMATION RECEIVED TO DR. VANICHKACHORN CALLED ON 04/25/13  AT [phone]  AND LEFT MESSAGE ON [facility]'S VOICEMAIL, RE: ABNORMAL URINE, RESULT KLEBSIELLA AND MIXED FLORA

## 2013-04-27 NOTE — H&P (Addendum)
Deanna Cortez  Location: [facility]  Patient #: [id number]  DOB: Mar 05, 1941  Married / Language: English / Race: Black or African American, White  Female      ??  History of Present Illness??  ??????????????????The patient is a 73 year old female who presents for a recheck of Acute lumbar back pain. The last clinic visit was 3 week(s) ago. Symptoms include pain. Symptoms are located symmetrically. The pain radiates to the left lower leg and right lower leg. The patient describes the pain as sharp and aching. Onset was gradual 6 month(s) ago. The symptoms occur constantly. The patient describes this pain as severe and worsening. The patient was previously evaluated in this clinic 3 week(s) ago. Presenting symptoms included back pain. Past treatment has included back surgery. Note for "Acute lumbar back pain": Ms. Deanna Cortez returns today with continued complaints of back and bilateral leg pain. ??She states that this is primarily from the posterior hip around into her thighs. ??She states that it does not extend below her knee much. ??She has some difficulty walking and moving around due to this. ??He states this is been ongoing for the past several months. ??She has had a lumbar fusion for years ago and was doing fairly well before this. ??She denies any accidents or injuries. ??She has undergone MRI today is here for results. ??She has been taking I gabapentin, as well as some hydrocodone for her symptoms. ??She is requesting hydrocodone refill.      ??  ??  Allergies  Duract *ANALGESICS - ANTI-INFLAMMATORY*  Augmentin *PENICILLINS*  Cephalexin *CEPHALOSPORINS*  NexIUM *ULCER DRUGS*  Erythromycins  TraMADol HCl *ANALGESICS - OPIOID*  Arthrotec *ANALGESICS - ANTI-INFLAMMATORY*  Reglan *GASTROINTESTINAL AGENTS - MISC.*  Sulfa Drugs  Darvocet-N 100 *ANALGESICS - OPIOID*  Levaquin *Fluoroquinolones**  Vioxx *ANALGESICS - ANTI-INFLAMMATORY*  Cipro *FLUOROQUINOLONES*  Meloxicam *ANALGESICS - ANTI-INFLAMMATORY*  Lescol  *ANTIHYPERLIPIDEMICS*  Pravachol *ANTIHYPERLIPIDEMICS*  Prempro *ESTROGENS*  Crestor *ANTIHYPERLIPIDEMICS*  Simvastatin *ANTIHYPERLIPIDEMICS*  Zoloft *ANTIDEPRESSANTS*  Amitriptyline HCl *ANTIDEPRESSANTS*  Metaxalone *MUSCULOSKELETAL THERAPY AGENTS*  Flexeril *MUSCULOSKELETAL THERAPY AGENTS*  Lyrica *ANTICONVULSANTS*    ??  ??  Medication History  Medications Reconciled.    ??  ??  Review of Systems??  Musculoskeletal:??Present- Back Pain. Not Present- Joint Pain, Joint Stiffness and Joint Swelling.    ??  ??  Physical Exam  The physical exam findings are as follows:    ??  Neurologic??  Sensory:??  Light Touch:??Intact??- Globally.  Overall Assessment of Muscle Strength and Tone reveals:  Lower Extremities:??Right Iliopsoas??- 5/5. Left Iliopsoas??- 5/5. Right Tibialis Anterior??- 5/5. Left Tibialis Anterior??- 5/5. Right Gastroc-Soleus??- 5/5. Left Gastroc-Soleus??- 5/5. Right EHL??- 5/5. Left EHL??- 5/5.  General Assessment of Reflexes: Right Ankle??- Clonus is not present. Left Ankle??- Clonus is not present.  Reflexes (Dermatomes):??2/2 Normal??- Left Achilles (L5-S2), Left Knee (L2-4), Right Achilles (L5-S2) and Right Knee (L2-4).      Musculoskeletal  Global Assessment??  Examination of related systems reveals - well-developed, well-nourished, in no acute distress, alert and oriented x 3. Gait and Station??- normal gait and station and normal posture. Right Lower Extremity??- normal strength and tone, normal range of motion without pain and no instability, subluxation or laxity. Left Lower Extremity??- normal strength and tone, normal range of motion without pain and no instability, subluxation or laxity.  Spine/Ribs/Pelvis??  Cervical Spine:??Examination of the cervical spine reveals - no tenderness to palpation, no pain, no swelling, edema or erythema, normal cervical spine movements and normal sensation.  Thoracic (  Dorsal) Spine:??Examination of the thoracic spine reveals - no tenderness over thoracic vertebrae, no pain, normal sensation  and normal thoracic spine movements.  Lumbosacral Spine:??Examination of the lumbosacral spine reveals - no known fractures or deformities.  Inspection and Palpation:??Tenderness??- moderate.  Assessment of pain reveals the following findings: The pain is characterized as - moderate. Location??- pain refers to lower back bilaterally.  ROJM:??Trunk Extension??- 15 degrees. Lumbar Spine Flexion??- 35 ??.  Functional Testing??- Babinski Test negative, Prone Knee Bending Test negative, Slump Test negative and Straight Leg Raising Test negative.      ??  Assessment & Plan??  SCIATICA (724.3)  Impression: The patient will continue with conservative care. She did have about 3 weeks of improvement with the last set of injections. We will restart those. If she does not get improvement I do think she might be a good candidate for a indirect decompression with lateral interbody fusion at L2-3. She'll let us know how the injections well.  Current Plans  ????l????  Referred to Pain Mgmt.    Risks and benefits of surgery were discussed with the patient and she would like to proceed with surgery.  ??  LOW BACK PAIN (724.2)  ??  DDD (722.52)  ??  REVIEW OF SYSTEMS: Systems were reviewed by the provider. (V49.9)  ??  ??  ????Date of Surgery Update:  Deanna Cortez was seen and examined.  History and physical has been reviewed. There have been no significant clinical changes since the completion of the originally dated History and Physical.    Signed By: Lorenz Coaster, PA-C     May 04, 2013 7:48 AM

## 2013-05-04 ENCOUNTER — Inpatient Hospital Stay
Admit: 2013-05-04 | Discharge: 2013-05-05 | Disposition: A | Discharge status: Home / Self Care | Payer: MEDICARE | Attending: Specialist | Admitting: Specialist

## 2013-05-04 LAB — GLUCOSE, POC: Glucose (POC): 133 mg/dL — ABNORMAL HIGH (ref 65–105)

## 2013-05-04 MED ADMIN — rocuronium (ZEMURON) injection: INTRAVENOUS | @ 15:00:00 | NDC 10139023505

## 2013-05-04 MED ADMIN — lidocaine (PF) (XYLOCAINE) 20 mg/mL (2 %) injection: INTRAVENOUS | @ 14:00:00 | NDC 63323049507

## 2013-05-04 MED ADMIN — lactated ringers infusion: INTRAVENOUS | @ 16:00:00 | NDC 00338011704

## 2013-05-04 MED ADMIN — 0.9% sodium chloride infusion: INTRAVENOUS | @ 17:00:00 | NDC 00409798309

## 2013-05-04 MED ADMIN — morphine (PF) PCA 150 mg/30 ml: INTRAVENOUS | @ 18:00:00 | NDC 00409602804

## 2013-05-04 MED ADMIN — gabapentin (NEURONTIN) capsule 300 mg: ORAL | @ 22:00:00 | NDC 68084076211

## 2013-05-04 MED ADMIN — morphine injection: INTRAVENOUS | @ 15:00:00 | NDC 00409126130

## 2013-05-04 MED ADMIN — fentaNYL citrate (PF) injection: INTRAVENOUS | @ 14:00:00 | NDC 00409909332

## 2013-05-04 MED ADMIN — midazolam (VERSED) injection: INTRAVENOUS | @ 14:00:00 | NDC 25021065502

## 2013-05-04 MED ADMIN — bacitracin 50,000 Units in sodium chloride irrigation 0.9 % 1,000 mL Irrigation: @ 15:00:00 | NDC 00409713809

## 2013-05-04 MED ADMIN — rocuronium (ZEMURON) injection: INTRAVENOUS | @ 14:00:00 | NDC 10139023505

## 2013-05-04 MED ADMIN — acetaminophen (TYLENOL) tablet 650 mg: ORAL | @ 19:00:00 | NDC 50580050110

## 2013-05-04 MED ADMIN — propofol (DIPRIVAN) 10 mg/mL injection: INTRAVENOUS | @ 16:00:00 | NDC 63323026920

## 2013-05-04 MED ADMIN — dexamethasone (DECADRON) 4 mg/mL injection: INTRAVENOUS | @ 14:00:00 | NDC 63323016501

## 2013-05-04 MED ADMIN — succinylcholine (ANECTINE) injection: INTRAVENOUS | @ 14:00:00 | NDC 00409662902

## 2013-05-04 MED ADMIN — ondansetron (ZOFRAN) injection: INTRAVENOUS | @ 15:00:00 | NDC 23155037831

## 2013-05-04 MED ADMIN — lactated ringers infusion: INTRAVENOUS | @ 14:00:00 | NDC 00338011704

## 2013-05-04 MED ADMIN — propofol (DIPRIVAN) 10 mg/mL injection: INTRAVENOUS | @ 14:00:00 | NDC 63323026920

## 2013-05-04 MED ADMIN — morphine (PF) PCA 150 mg/30 ml: INTRAVENOUS | @ 17:00:00 | NDC 00409602804

## 2013-05-04 MED ADMIN — prazosin (MINIPRESS) capsule 2 mg: ORAL | @ 22:00:00 | NDC 51079063001

## 2013-05-04 MED ADMIN — morphine injection: INTRAVENOUS | @ 16:00:00 | NDC 00409126130

## 2013-05-04 MED ADMIN — glycopyrrolate (ROBINUL) injection: INTRAVENOUS | @ 16:00:00 | NDC 00143968201

## 2013-05-04 MED ADMIN — pantoprazole (PROTONIX) tablet 40 mg: ORAL | @ 22:00:00 | NDC 68084081311

## 2013-05-04 MED ADMIN — metoprolol (LOPRESSOR) injection: INTRAVENOUS | @ 14:00:00 | NDC 00143987310

## 2013-05-04 MED ADMIN — thrombin (THROMBOGEN) topical solution: TOPICAL | @ 15:00:00 | NDC 60793021720

## 2013-05-04 MED ADMIN — gelatin absorbable (GELFOAM) 100 sponge: TOPICAL | @ 15:00:00 | NDC 00009034201

## 2013-05-04 MED ADMIN — fentaNYL citrate (PF) injection 25 mcg: INTRAVENOUS | @ 18:00:00 | NDC 00641602701

## 2013-05-04 MED ADMIN — ceFAZolin in 0.9% NS (ANCEF) IVPB soln 2 g: INTRAVENOUS | @ 14:00:00 | NDC 09999966850

## 2013-05-04 MED ADMIN — ceFAZolin (ANCEF) 3 g in 0.9% NS 100 ml IVPB: INTRAVENOUS | @ 22:00:00 | NDC 09999051801

## 2013-05-04 MED ADMIN — neostigmine (PROSTIGMINE) injection: INTRAVENOUS | @ 16:00:00 | NDC 00517003325

## 2013-05-04 MED FILL — CEFAZOLIN 3G IN 100 ML 0.9% NS PREMIX: 3 gram/100ml | INTRAVENOUS | Qty: 100

## 2013-05-04 MED FILL — PRAZOSIN 1 MG CAP: 1 mg | ORAL | Qty: 2

## 2013-05-04 MED FILL — DIPRIVAN 10 MG/ML INTRAVENOUS EMULSION: 10 mg/mL | INTRAVENOUS | Qty: 1105

## 2013-05-04 MED FILL — ONDANSETRON (PF) 4 MG/2 ML INJECTION: 4 mg/2 mL | INTRAMUSCULAR | Qty: 2

## 2013-05-04 MED FILL — PANTOPRAZOLE 40 MG TAB, DELAYED RELEASE: 40 mg | ORAL | Qty: 1

## 2013-05-04 MED FILL — METOPROLOL TARTRATE 5 MG/5 ML IV SOLN: 5 mg/ mL | INTRAVENOUS | Qty: 5

## 2013-05-04 MED FILL — FENTANYL CITRATE (PF) 50 MCG/ML IJ SOLN: 50 mcg/mL | INTRAMUSCULAR | Qty: 5

## 2013-05-04 MED FILL — GABAPENTIN 300 MG CAP: 300 mg | ORAL | Qty: 1

## 2013-05-04 MED FILL — MIDAZOLAM 1 MG/ML IJ SOLN: 1 mg/mL | INTRAMUSCULAR | Qty: 2

## 2013-05-04 MED FILL — SODIUM CHLORIDE 0.9 % IV: INTRAVENOUS | Qty: 1000

## 2013-05-04 MED FILL — NEOSTIGMINE METHYLSULFATE 1 MG/ML INJECTION: 1 mg/mL | INTRAMUSCULAR | Qty: 2.5

## 2013-05-04 MED FILL — LACTATED RINGERS IV: INTRAVENOUS | Qty: 500

## 2013-05-04 MED FILL — LATANOPROST 0.005 % EYE DROPS: 0.005 % | OPHTHALMIC | Qty: 2.5

## 2013-05-04 MED FILL — LACTATED RINGERS IV: INTRAVENOUS | Qty: 1000

## 2013-05-04 MED FILL — QUELICIN 20 MG/ML INJECTION SOLUTION: 20 mg/mL | INTRAMUSCULAR | Qty: 13

## 2013-05-04 MED FILL — XYLOCAINE-MPF 20 MG/ML (2 %) INJECTION SOLUTION: 20 mg/mL (2 %) | INTRAMUSCULAR | Qty: 5

## 2013-05-04 MED FILL — ROCURONIUM 10 MG/ML IV: 10 mg/mL | INTRAVENOUS | Qty: 45

## 2013-05-04 MED FILL — MORPHINE (PF) 150 MG/30 ML CONCENTRATED INFUSION: 150 mg/30 mL | INTRAVENOUS | Qty: 30

## 2013-05-04 MED FILL — LACTATED RINGERS IV: INTRAVENOUS | Qty: 200

## 2013-05-04 MED FILL — ACETAMINOPHEN 325 MG TABLET: 325 mg | ORAL | Qty: 2

## 2013-05-04 MED FILL — TIMOLOL MALEATE 0.5 % EYE DROPS: 0.5 % | OPHTHALMIC | Qty: 5

## 2013-05-04 MED FILL — GLYCOPYRROLATE 0.2 MG/ML IJ SOLN: 0.2 mg/mL | INTRAMUSCULAR | Qty: 2

## 2013-05-04 MED FILL — FENTANYL CITRATE (PF) 50 MCG/ML IJ SOLN: 50 mcg/mL | INTRAMUSCULAR | Qty: 2

## 2013-05-04 MED FILL — CEFAZOLIN 2 GRAM/50 ML NS IVPB: INTRAVENOUS | Qty: 50

## 2013-05-04 MED FILL — PROPOFOL 10 MG/ML IV EMUL: 10 mg/mL | INTRAVENOUS | Qty: 50

## 2013-05-04 MED FILL — DEXAMETHASONE SODIUM PHOSPHATE 4 MG/ML IJ SOLN: 4 mg/mL | INTRAMUSCULAR | Qty: 2

## 2013-05-04 MED FILL — METOPROLOL TARTRATE 5 MG/5 ML IV SOLN: 5 mg/ mL | INTRAVENOUS | Qty: 2

## 2013-05-04 NOTE — Op Note (Signed)
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    PROCEDURES PERFORMED:  1. Lateral diskectomy L2-L3.  2. Lateral lumbar interbody fusion L2-L3.  3. PEEK interbody graft L2-L3.  4. Anterior plate and screw fixation and instrumentation L2.    SURGEON: Rozlynn Lippold S. Raylene MiyamotoVanichkachorn, MD.    ASSISTANT: Kerby MoorsAnna Dunn, PA-C.    ESTIMATED BLOOD LOSS: Minimal.    COMPLICATIONS: None.    SPECIMENS REMOVED: None.    ANESTHESIA: General.    INDICATIONS: This is a 73 year old female with a history of previous fusion  from L3 to L5 many years ago, now with a retrolisthesis and  spondylolisthesis of L2-3 with moderate to severe facet hypertrophy and  stenosis. The patient failed conservative treatment, understood the risks  and benefits and elected to proceed.    DESCRIPTION OF PROCEDURE: The patient was identified as the correct  patient. She was brought to the operative suite and underwent general  anesthesia without difficulty. Ancef 2 grams was given to the patient. The  patient had a Foley catheter placed as well as preop neuromonitoring.  Baselines obtained and these remained stable throughout the surgical  procedure. At that time, the patient was placed in the lateral position.  Prepping and taping was performed. We got good AP and lateral fluoroscopy  images of the L2-L3 disk space at which time the back was prepped and  draped sterilely. Time-out performed. At that time, a 2-incision technique  was then performed for access to the retroperitoneal space. The dilators  were then placed directly on the psoas at the  L2-L3 disk space and  carefully dissected through the musculature. This was tested with  neuromonitoring. This was followed by sequential dilators with docking on  the L2-L3 disk space, followed by annulotomy.    Complete diskectomy was performed at L2-L3 disk space using a combination  of pituitary rongeurs, curved curettes and we also released the  contralateral annulus with a Cobb. The endplates were lightly curetted.  Trial spacers were placed up to 11 mm with a lordotic graft providing good  anterior column reconstruction as well as reduction of the retrolisthesis  and disk space collapse. The endplates were rasped. Osteoamp was mixed with  the DBX putty and placed into the graft. The 11 mm PEEK interbody graft was  then impacted in place at the L2-L3 space. Anterior plate had been  placed prior  to that and screw fixation was placed into the L2 and L3 body  with 5.5 x 40 mm HA cutting screws. Neuromonitoring was stable. This was  Globus instrumentation. Wounds were thoroughly irrigated. Final locking  mechanism was engaged. Deep retractors were removed. Soft tissues were  examined. AP and lateral demonstrated stable fixation at L2-L3. The closure  was with 2-0 Vicryl and 3-0 Vicryl. The patient was returned to the PACU in  stable condition.          Tomie China, MD    cc:   Tomie China, MD        JSV/wmx; D: 05/04/2013 10:58 A; T: 05/04/2013 11:26 A; Doc# [id number]; Job#  [id number]

## 2013-05-04 NOTE — Progress Notes (Signed)
Transfer Medication Reconciliation:    Information obtained from:patient and her home medication list    Significant PMH/Disease States:   Past Medical History   Diagnosis Date   ??? Hypertension    ??? Unspecified sleep apnea      USES CPAP   ??? GERD (gastroesophageal reflux disease)    ??? Cancer      SQUAMOUS CELL CARCINOMA X 2   ??? Psychiatric disorder      DEPRESSION   ??? Chronic pain      BACK AND LEGS       Chief Complaint for this Admission:  Lumbago    Allergies:  Celebrex; Amitriptyline; Arthrotec 50; Augmentin; Cephalexin; Ciprofloxacin; Crestor; Flexeril; Lescol; Levaquin; Lyrica; Meloxicam; Metaxalone; Mirapex; Other medication; Pravachol; Reglan; Simvastatin; Sulfur; Tramadol; Vioxx; Darvocet a500; Erythromycin; Nexium; and Zoloft    Prior to Admission Medications:   Prior to Admission Medications   Prescriptions Last Dose Informant Patient Reported? Taking?   Bifidobacterium Infantis (ALIGN) 4 mg cap 05/04/2013 at 0500  Yes Yes   Sig: Take 4 mg by mouth daily.   Desvenlafaxine (PRISTIQ) 100 mg Tb24 05/03/2013 at Unknown time  Yes Yes   Sig: Take 100 mg by mouth daily.   HYDROcodone-acetaminophen (NORCO) 5-325 mg per tablet 05/03/2013 at 2130  Yes Yes   Sig: Take 1 Tab by mouth every four (4) hours as needed for Pain.   amLODIPine (NORVASC) 5 mg tablet 05/04/2013 at 0500  Yes Yes   Sig: Take 5 mg by mouth daily.   atenolol (TENORMIN) 100 mg tablet 05/02/2013  Yes No   Sig: Take 100 mg by mouth nightly.   buPROPion XL (WELLBUTRIN XL) 150 mg tablet 05/04/2013 at 0500  Yes Yes   Sig: Take 150 mg by mouth every morning.   chlorthalidone (HYGROTEN) 25 mg tablet 05/03/2013 at Unknown time  Yes Yes   Sig: Take 25 mg by mouth daily.   clonazePAM (KLONOPIN) 0.5 mg tablet 05/02/2013  Yes No   Sig: Take 0.5 mg by mouth nightly as needed.   desipramine (NORPRAMIN) 50 mg tablet 05/02/2013  Yes Yes   Sig: Take 50 mg by mouth daily.   diclofenac EC (VOLTAREN) 75 mg EC tablet 04/28/2013  Yes No   Sig: Take 75 mg by mouth two (2) times a  day.   gabapentin (NEURONTIN) 300 mg capsule 05/03/2013 at 2100  Yes Yes   Sig: Take 300 mg by mouth three (3) times daily.   lansoprazole (PREVACID) 30 mg capsule 05/03/2013 at Unknown time  Yes Yes   Sig: Take  by mouth two (2) times a day.   latanoprost (XALATAN) 0.005 % ophthalmic solution 05/03/2013 at 2100  Yes Yes   Sig: Administer 1 Drop to both eyes nightly.   prazosin (MINIPRESS) 2 mg capsule 05/04/2013 at 0500  Yes Yes   Sig: Take 2 mg by mouth three (3) times daily.   timolol maleate 0.5 % drpd ophthalmic solution 05/04/2013 at 0500  Yes Yes   Sig: Administer 1 Drop to both eyes daily.   zolpidem (AMBIEN) 10 mg tablet 05/03/2013 at Unknown time  Yes Yes   Sig: Take 10 mg by mouth nightly as needed for Sleep.      Facility-Administered Medications: None       Comments/Recommendations: removed omeprazole as patient states she takes lansoprazole 30 mg BID

## 2013-05-04 NOTE — Other (Signed)
TRANSFER - OUT REPORT:    Verbal report given to Deanna Cortez on Deanna Cortez  being transferred to 543 for routine post - op       Report consisted of patient???s Situation, Background, Assessment and   Recommendations(SBAR).     Time Pre op antibiotic [id number]  Anesthesia Stop time: 1128    Information from the following report(s) SBAR was reviewed with the receiving nurse.    Opportunity for questions and clarification was provided.     Is the patient on 02? YES       L/Min 3       Pt states her CPAP is in the car.    Is the patient on a monitor? NO    Is the nurse transporting with the patient? NO    Surgical Waiting Area notified of patient's transfer from PACU? YES      The following personal items collected during your admission accompanied patient upon transfer:   Dental Appliance: Dental Appliances: None  Vision:    Hearing Aid:    Jewelry: Jewelry: None  Clothing: Clothing:  (bag of clothing signed in)  Other Valuables: Other Valuables: None  Valuables sent to safe:

## 2013-05-04 NOTE — Brief Op Note (Signed)
BRIEF OPERATIVE NOTE    Date of Procedure: 05/04/2013   Preoperative Diagnosis: LUMBAR STENOSIS, SPONDYLOLITHESIS  Postoperative Diagnosis: LUMBAR STENOSIS, SPONDYLOLITHESIS    Procedure(s):  L2-3 LATERAL INTERBODY FUSION   Surgeon(s) and Role:     * Kem ParkinsonJed Dezra Mandella, MD - Primary  Anesthesia: General   Estimated Blood Loss: Minimal  Specimens: * No specimens in log *   Findings:Stenosis   Complications: None  Implants:   Implant Name Type Inv. Item Serial No. Manufacturer Lot No. LRB No. Used Action   GRAFT BNE GRAN 2-4MM 10CC -- OSTEOAMP - [id number]  [id number]  [id number] [employer]  N/A 1 Implanted   GRAFT BNE DBM PUTTY W/CHIP 5ML -- BIOREADY - [id number]  [id number]  [id number] [employer] [id number] N/A 1 Implanted   SCR SPNE HA COAT VA 5.5X40MM --  - [id number]  [id number]  [employer] [id number] N/A 1 Implanted

## 2013-05-04 NOTE — Anesthesia Post-Procedure Evaluation (Signed)
Post-Anesthesia Evaluation and Assessment    Patient: Deanna BostonDiana Cortez MRN: [id number]  SSN: [id number]    Date of Birth: 1941/01/11  Age: 73 y.o.  Sex: female       Cardiovascular Function/Vital Signs  Visit Vitals   Item Reading   ??? BP 157/60   ??? Pulse 81   ??? Temp 36.6 ??C (97.9 ??F)   ??? Resp 11   ??? Ht 5\' 6"  (1.676 m)   ??? Wt 121.564 kg (268 lb)   ??? BMI 43.28 kg/m2   ??? SpO2 98%       Patient is status post general anesthesia for Procedure(s):  L2-3 LATERAL INTERBODY FUSION .    Nausea/Vomiting: None    Postoperative hydration reviewed and adequate.    Pain:  Pain Scale 1: Numeric (0 - 10) (05/04/13 1124)  Pain Intensity 1: 0 (05/04/13 1124)   Managed    Neurological Status:   Neuro (WDL): Exceptions to WDL (05/04/13 1124)  Neuro  Neurologic State: Drowsy (05/04/13 1124)  LUE Motor Response: Purposeful (05/04/13 1124)  LLE Motor Response: Purposeful (05/04/13 1124)  RUE Motor Response: Purposeful (05/04/13 1124)  RLE Motor Response: Purposeful (05/04/13 1124)   At baseline    Mental Status and Level of Consciousness: Alert and oriented     Pulmonary Status:   O2 Device: CO2 nasal cannula (05/04/13 1125)   Adequate oxygenation and airway patent    Complications related to anesthesia: None    Post-anesthesia assessment completed. No concerns    Signed By: Vickki MuffAnthony P Amen Dargis, MD     May 04, 2013

## 2013-05-04 NOTE — Brief Op Note (Signed)
BRIEF OPERATIVE NOTE    Date of Procedure: 05/04/2013   Preoperative Diagnosis: LUMBAR STENOSIS, SPONDYLOLITHESIS  Postoperative Diagnosis: LUMBAR STENOSIS, SPONDYLOLITHESIS    Procedure(s):  L2-3 LATERAL INTERBODY FUSION   Surgeon(s) and Role:     * Kem ParkinsonJed Vanichkachorn, MD - Primary  Assistant: Kerby MoorsAnna Tanette Chauca, [facility]  Anesthesia: General   Estimated Blood Loss: 50cc  Specimens: * No specimens in log *   Findings: stenosis   Complications: none  Implants:   Implant Name Type Inv. Item Serial No. Manufacturer Lot No. LRB No. Used Action   GRAFT BNE GRAN 2-4MM 10CC -- OSTEOAMP - [id number]  [id number]  [id number] [employer]  N/A 1 Implanted   GRAFT BNE DBM PUTTY W/CHIP 5ML -- BIOREADY - [id number]  [id number]  [id number] [employer] [id number] N/A 1 Implanted   SCR SPNE HA COAT VA 5.5X40MM --  - [id number]  [id number]  [employer] [id number] N/A 1 Implanted   SCR SPNE HA COAT VA 5.5X40MM --  - [id number]  [id number]  [employer] [id number] N/A 1 Implanted   PLT SPNE 6 DEG 20X11MM -- INTERCONTINENTAL - [id number]  [id number]  [employer]  N/A 1 Implanted   INTERCONTINENTAL CAGE Interbody     [employer]   N/A 1 Implanted

## 2013-05-04 NOTE — Anesthesia Pre-Procedure Evaluation (Signed)
Anesthetic History   No history of anesthetic complications           Review of Systems / Medical History  Patient summary reviewed, nursing notes reviewed and pertinent labs reviewed    Pulmonary        Sleep apnea: CPAP         Neuro/Psych         Psychiatric history     Cardiovascular    Hypertension                 GI/Hepatic/Renal     GERD             Endo/Other        Morbid obesity     Other Findings            Physical Exam    Airway  Mallampati: II  TM Distance: > 6 cm  Neck ROM: normal range of motion   Mouth opening: Normal     Cardiovascular  Regular rate and rhythm,  S1 and S2 normal,  no murmur, click, rub, or gallop             Dental  No notable dental hx       Pulmonary  Breath sounds clear to auscultation               Abdominal  GI exam deferred       Other Findings            Anesthetic Plan    ASA: 3  Anesthesia type: general          Induction: Intravenous  Anesthetic plan and risks discussed with: Patient

## 2013-05-04 NOTE — Other (Signed)
SBAR GIVEN TO Sharion SettlerB. WOLF, RN IN PACU.

## 2013-05-04 NOTE — Other (Signed)
CONTACTED PATIENT'S DAUGHTER, JENNIFER, IN SURGICAL WAITING AREA VIA PHONE AT 1000 TO UPDATE SURGICAL START TIME.

## 2013-05-04 NOTE — Op Note (Signed)
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    PROCEDURES PERFORMED:  1. Lateral diskectomy L2-L3.  2. Lateral lumbar interbody fusion L2-L3.  3. PEEK interbody graft L2-L3.  4. Anterior plate and screw fixation and instrumentation L2.    SURGEON: Mitzi Lilja S. Makai Dumond, MD.    ASSISTANT: Anna Dunn, PA-C.    ESTIMATED BLOOD LOSS: Minimal.    COMPLICATIONS: None.    SPECIMENS REMOVED: None.    ANESTHESIA: General.    INDICATIONS: This is a 72-year-old female with a history of previous fusion  from L3 to L5 many years ago, now with a retrolisthesis and  spondylolisthesis of L2-3 with moderate to severe facet hypertrophy and  stenosis. The patient failed conservative treatment, understood the risks  and benefits and elected to proceed.    DESCRIPTION OF PROCEDURE: The patient was identified as the correct  patient. She was brought to the operative suite and underwent general  anesthesia without difficulty. Ancef 2 grams was given to the patient. The  patient had a Foley catheter placed as well as preop neuromonitoring.  Baselines obtained and these remained stable throughout the surgical  procedure. At that time, the patient was placed in the lateral position.  Prepping and taping was performed. We got good AP and lateral fluoroscopy  images of the L2-L3 disk space at which time the back was prepped and  draped sterilely. Time-out performed. At that time, a 2-incision technique  was then performed for access to the retroperitoneal space. The dilators  were then placed directly on the psoas at the  L2-L3 disk space and  carefully dissected through the musculature. This was tested with  neuromonitoring. This was followed by sequential dilators with docking on  the L2-L3 disk space, followed by annulotomy.    Complete diskectomy was performed at L2-L3 disk space using a combination  of pituitary rongeurs, curved curettes and we also released the  contralateral annulus with a Cobb. The endplates were lightly curetted.  Trial spacers were placed up to 11 mm with a lordotic graft providing good  anterior column reconstruction as well as reduction of the retrolisthesis  and disk space collapse. The endplates were rasped. Osteoamp was mixed with  the DBX putty and placed into the graft. The 11 mm PEEK interbody graft was  then impacted in place at the L2-L3 space. Anterior plate had been  placed prior   to that and screw fixation was placed into the L2 and L3 body  with 5.5 x 40 mm HA cutting screws. Neuromonitoring was stable. This was  Globus instrumentation. Wounds were thoroughly irrigated. Final locking  mechanism was engaged. Deep retractors were removed. Soft tissues were  examined. AP and lateral demonstrated stable fixation at L2-L3. The closure  was with 2-0 Vicryl and 3-0 Vicryl. The patient was returned to the PACU in  stable condition.          Canaan Holzer S Page Pucciarelli, MD    cc:   Marcos Ruelas S Dahiana Kulak, MD        JSV/wmx; D: 05/04/2013 10:58 A; T: 05/04/2013 11:26 A; Doc# [id number]; Job#  [id number]

## 2013-05-05 LAB — METABOLIC PANEL, BASIC
Anion gap: 7 mmol/L (ref 5–15)
BUN/Creatinine ratio: 12 (ref 12–20)
BUN: 9 MG/DL (ref 6–20)
CO2: 31 mmol/L (ref 21–32)
Calcium: 7.9 MG/DL — ABNORMAL LOW (ref 8.5–10.1)
Chloride: 100 mmol/L (ref 97–108)
Creatinine: 0.78 MG/DL (ref 0.45–1.15)
GFR est AA: >60 mL/min/{1.73_m2} (ref >60)
GFR est non-AA: >60 mL/min/{1.73_m2} (ref >60)
Glucose: 126 mg/dL — ABNORMAL HIGH (ref 65–100)
Potassium: 3.1 mmol/L — ABNORMAL LOW (ref 3.5–5.1)
Sodium: 138 mmol/L (ref 136–145)

## 2013-05-05 LAB — HEMOGLOBIN: HGB: 10.2 g/dL — ABNORMAL LOW (ref 11.5–16.0)

## 2013-05-05 MED ORDER — POTASSIUM CHLORIDE SR 10 MEQ TAB, PARTICLES/CRYSTALS
10 mEq | ORAL_TABLET | Freq: Every day | ORAL | Status: DC
Start: 2013-05-05 — End: 2013-06-16

## 2013-05-05 MED ORDER — POTASSIUM CHLORIDE 10 % ORAL LIQUID
20 mEq/15 mL | Freq: Three times a day (TID) | ORAL | Status: DC
Start: 2013-05-05 — End: 2013-05-05
  Administered 2013-05-05: 17:00:00 via ORAL

## 2013-05-05 MED ORDER — OXYCODONE 5 MG TAB
5 mg | ORAL_TABLET | ORAL | Status: DC | PRN
Start: 2013-05-05 — End: 2013-06-24

## 2013-05-05 MED ADMIN — morphine (PF) PCA 150 mg/30 ml: INTRAVENOUS | @ 10:00:00 | NDC 00409602804

## 2013-05-05 MED ADMIN — ceFAZolin (ANCEF) 3 g in 0.9% NS 100 ml IVPB: INTRAVENOUS | @ 06:00:00 | NDC 09999051801

## 2013-05-05 MED ADMIN — morphine (PF) PCA 150 mg/30 ml: INTRAVENOUS | @ 01:00:00 | NDC 00409602804

## 2013-05-05 MED ADMIN — latanoprost (XALATAN) 0.005 % ophthalmic solution 1 Drop: OPHTHALMIC | @ 03:00:00 | NDC 59762033302

## 2013-05-05 MED ADMIN — sodium chloride (NS) flush 5-10 mL: INTRAVENOUS | @ 19:00:00 | NDC 87701099893

## 2013-05-05 MED ADMIN — sodium chloride (NS) flush 5-10 mL: INTRAVENOUS | @ 12:00:00 | NDC 87701099893

## 2013-05-05 MED ADMIN — desvenlafaxine (PRISTIQ) ER 100 mg (PATIENT SUPPLIED MEDICATION) 1 tab: ORAL | @ 14:00:00 | NDC 72888014490

## 2013-05-05 MED ADMIN — oxyCODONE IR (ROXICODONE) tablet 10 mg: ORAL | @ 20:00:00 | NDC 00406055223

## 2013-05-05 MED ADMIN — oxyCODONE IR (ROXICODONE) tablet 10 mg: ORAL | @ 16:00:00 | NDC 00406055223

## 2013-05-05 MED ADMIN — amLODIPine (NORVASC) tablet 5 mg: ORAL | @ 15:00:00 | NDC 59762153002

## 2013-05-05 MED ADMIN — 0.9% sodium chloride infusion: INTRAVENOUS | @ 10:00:00 | NDC 00409798348

## 2013-05-05 MED ADMIN — acetaminophen (TYLENOL) tablet 650 mg: ORAL | @ 01:00:00 | NDC 51645070310

## 2013-05-05 MED ADMIN — acetaminophen (TYLENOL) tablet 650 mg: ORAL | @ 07:00:00 | NDC 51645070310

## 2013-05-05 MED ADMIN — prazosin (MINIPRESS) capsule 2 mg: ORAL | @ 16:00:00 | NDC 51079063001

## 2013-05-05 MED ADMIN — gabapentin (NEURONTIN) capsule 300 mg: ORAL | @ 15:00:00 | NDC 68084076211

## 2013-05-05 MED ADMIN — acetaminophen (TYLENOL) tablet 650 mg: ORAL | @ 12:00:00 | NDC 50580050110

## 2013-05-05 MED ADMIN — timolol (TIMOPTIC) 0.5 % ophthalmic solution 1 Drop: OPHTHALMIC | @ 14:00:00 | NDC 61314022705

## 2013-05-05 MED ADMIN — atenolol (TENORMIN) tablet 100 mg: ORAL | @ 03:00:00 | NDC 62584046711

## 2013-05-05 MED ADMIN — prazosin (MINIPRESS) capsule 2 mg: ORAL | @ 03:00:00 | NDC 51079063001

## 2013-05-05 MED ADMIN — chlorthalidone (HYGROTEN) tablet 25 mg: ORAL | @ 15:00:00 | NDC 51079005801

## 2013-05-05 MED ADMIN — morphine (PF) PCA 150 mg/30 ml: INTRAVENOUS | @ 05:00:00 | NDC 76329191101

## 2013-05-05 MED ADMIN — pantoprazole (PROTONIX) tablet 40 mg: ORAL | @ 12:00:00 | NDC 68084081311

## 2013-05-05 MED ADMIN — ondansetron (ZOFRAN) injection 4 mg: INTRAVENOUS | @ 16:00:00 | NDC 23155037831

## 2013-05-05 MED ADMIN — 0.9% sodium chloride infusion: INTRAVENOUS | @ 03:00:00 | NDC 00409798309

## 2013-05-05 MED ADMIN — gabapentin (NEURONTIN) capsule 300 mg: ORAL | @ 03:00:00 | NDC 53746010205

## 2013-05-05 MED ADMIN — senna-docusate (PERICOLACE) 8.6-50 mg per tablet 1 Tab: ORAL | @ 15:00:00 | NDC 00039000210

## 2013-05-05 MED ADMIN — buPROPion SR (WELLBUTRIN SR) tablet 150 mg: ORAL | @ 12:00:00 | NDC 51079039201

## 2013-05-05 MED FILL — SENNA PLUS 8.6 MG-50 MG TABLET: ORAL | Qty: 1

## 2013-05-05 MED FILL — GABAPENTIN 300 MG CAP: 300 mg | ORAL | Qty: 1

## 2013-05-05 MED FILL — BD POSIFLUSH NORMAL SALINE 0.9 % INJECTION SYRINGE: INTRAMUSCULAR | Qty: 10

## 2013-05-05 MED FILL — OXYCODONE 5 MG TAB: 5 mg | ORAL | Qty: 2

## 2013-05-05 MED FILL — ACETAMINOPHEN 325 MG TABLET: 325 mg | ORAL | Qty: 2

## 2013-05-05 MED FILL — PRAZOSIN 1 MG CAP: 1 mg | ORAL | Qty: 2

## 2013-05-05 MED FILL — AMLODIPINE 5 MG TAB: 5 mg | ORAL | Qty: 1

## 2013-05-05 MED FILL — ONDANSETRON (PF) 4 MG/2 ML INJECTION: 4 mg/2 mL | INTRAMUSCULAR | Qty: 2

## 2013-05-05 MED FILL — BUPROPION SR 150 MG TAB: 150 mg | ORAL | Qty: 1

## 2013-05-05 MED FILL — CHLORTHALIDONE 25 MG TAB: 25 mg | ORAL | Qty: 1

## 2013-05-05 MED FILL — PANTOPRAZOLE 40 MG TAB, DELAYED RELEASE: 40 mg | ORAL | Qty: 1

## 2013-05-05 MED FILL — ATENOLOL 50 MG TAB: 50 mg | ORAL | Qty: 2

## 2013-05-05 MED FILL — POTASSIUM CHLORIDE 10 % ORAL LIQUID: 20 mEq/15 mL | ORAL | Qty: 30

## 2013-05-05 NOTE — Progress Notes (Signed)
Problem: Mobility Impaired (Adult and Pediatric)  Goal: *Acute Goals and Plan of Care (Insert Text)  Physical Therapy Goals  Initiated 05/05/2013    1. Patient will move from supine to sit and sit to supine in bed with modified independence within 4 days.  2. Patient will perform sit to stand with modified independence within 4 days.  3. Patient will ambulate with modified independence for 150 feet with the least restrictive device within 4 days.  4. Patient will ascend/descend 3 stairs with 1 handrail(s) with modified independence within 4 days.  5. Patient will verbalize and demonstrate understanding of spinal precautions (No bending, lifting greater than 5 lbs, or twisting; log-roll technique; frequent repositioning as instructed) within 4 days.  PHYSICAL THERAPY EVALUATION  Patient: Deanna Cortez ([age] y.o. female)  Date: 05/05/2013  Primary Diagnosis: SCIATICA, LOW BACK PAIN, DJD  Lumbago  Procedure(s) (LRB):  L2-3 LATERAL INTERBODY FUSION  (N/A) 1 Day Post-Op   Precautions:   Back (precautions, TLSO)      ASSESSMENT :  Based on the objective data described below, the patient presents with impaired mobility, left lateral back pain at the incision site, nausea, and decreased gait ability s/p back surgery yesterday. Patient requires assistance X 2 for bed mobility and transfers. She is able to ambulate a short distance with rolling walker support. She is limited due to pain, nausea, and dizziness. Her blood pressure is stable. She will continue to benefit from skilled PT to increase functional mobility and independence prior to discharge home with family assistance.    Patient will benefit from skilled intervention to address the above impairments.  Patient???s rehabilitation potential is considered to be Good  Factors which may influence rehabilitation potential include:   [X]          None noted  [ ]          Mental ability/status  [ ]          Medical condition  [ ]          Home/family situation and support systems  [ ]           Safety awareness  [ ]          Pain tolerance/management  [ ]          Other:        PLAN :  Recommendations and Planned Interventions:  [X]            Bed Mobility Training             [ ]     Neuromuscular Re-Education  [X]            Transfer Training                   [ ]     Orthotic/Prosthetic Training  [X]            Gait Training                         [ ]     Modalities  [ ]            Therapeutic Exercises           [ ]     Edema Management/Control  [X]            Therapeutic Activities            [X]     Patient and Family Training/Education  [ ]            Other (comment):  Frequency/Duration: Patient will be followed by physical therapy 1-2 times per day/4-7 days per week to address goals.  Discharge Recommendations: Home Health and None  Further Equipment Recommendations for Discharge: none       SUBJECTIVE:   Patient stated ???I feel a little dizzy and sick to my stomach.???      OBJECTIVE DATA SUMMARY:       Past Medical History   Diagnosis Date   ??? Hypertension     ??? Unspecified sleep apnea         USES CPAP   ??? GERD (gastroesophageal reflux disease)     ??? Cancer         SQUAMOUS CELL CARCINOMA X 2   ??? Psychiatric disorder         DEPRESSION   ??? Chronic pain         BACK AND LEGS     Past Surgical History   Procedure Laterality Date   ??? Hx gyn   1977       TUBAL LIGATION   ??? Hx cholecystectomy   2004   ??? Hx orthopaedic   1994       CARPAL TUNNEL RT WRIST    ??? Hx orthopaedic   2010       DOUBLE LUMBAR FUSION   ??? Pr breast surgery procedure unlisted   2013       BREAST BIOPSY   ??? Hx colonoscopy   LAST ONE 2011   ??? Hx other surgical   1992/1994       LEEP PROCEDURE    ??? Hx other surgical   2009       ERCP   ??? Hx other surgical   2007/2009       SQUAMOUS CELL CARCINOMA REMOVED X 2     Prior Level of Function/Home Situation: Independent prior to admission. Able to drive and go to the store. May use a scooter for long distances at the store. No assistive device for gait but has used a rolling walker in the past  after back surgery.    Home Situation  Home Environment: Private residence  # Steps to Enter: 3  Rails to Enter: Yes  One/Two Story Residence: One story  Living Alone: Yes (Daughter to stay with her. Her husband recently died.)  Support Systems: Child(ren), Family member(s), Friends \\ neighbors  Patient Expects to be Discharged to:: Private residence  Current DME Used/Available at Home: Adaptive dressing aides, Paediatric nurse, Commode, bedside, Adaptive bathing aides, Environmental consultant, rolling (toilet tongs)  Tub or Shower Type: Shower  Critical Behavior:  Neurologic State: Alert  Orientation Level: Oriented X4  Cognition: Appropriate decision making, Appropriate for age attention/concentration, Appropriate safety awareness, Follows commands  Safety/Judgement: Awareness of environment, Good awareness of safety precautions (recalled 3/3 back precautions)  Skin:  S/p back surgery.  Strength:    Strength: Within functional limits  Tone & Sensation:   Tone: Normal  Sensation: Intact (except bilateral foot numbness. Had PTA. Intact to light touch.)    Range Of Motion:  AROM: Within functional limits  Coordination:  Coordination: Within functional limits    Functional Mobility:  Bed Mobility:  Rolling: Moderate assistance;Assist X2  Supine to Sit: Moderate assistance;Assist X2 - logroll technique.  Sit to Supine:  (Up in chair after gait.)  Scooting: Additional time  Transfers:  Sit to Stand: Moderate assistance;Assist X2;Verbal cues;Additional time  Stand to Sit: Additional time;Minimum assistance;Assist X2;Verbal cues  Balance:   Sitting: Intact;Without support  Standing: With support;Impaired  Standing - Static: Good  Standing - Dynamic : Fair  Ambulation/Gait Training:  Distance (ft): 30 Feet (ft)  Assistive Device: Gait belt;Brace/Splint;Walker, rolling  Ambulation - Level of Assistance: CGA (X 2)  Gait Abnormalities: Antalgic;Decreased step clearance;Trunk sway increased  Base of Support: Widened  Speed/Cadence: Slow  Step  Length: Right shortened;Left shortened   Limited due to nausea, dizziness, and back pain.    Pain:  Pain Scale 1: Numeric (0 - 10)  Pain Intensity 1: 2  Pain Location 1: Back  Pain Orientation 1: Left;Lateral  Pain Description 1: Aching  Pain Intervention(s) 1: Encouraged PCA  Activity Tolerance:   Please refer to the flowsheet for vital signs taken during this treatment.  After treatment:   [X]          Patient left in no apparent distress sitting up in chair  [ ]          Patient left in no apparent distress in bed  [X]          Call bell left within reach  [X]          Nursing notified  [X]          Caregiver present  [ ]          Bed alarm activated      COMMUNICATION/EDUCATION:   The patient???s plan of care was discussed with: Registered Nurse.  [X]          Fall prevention education was provided and the patient/caregiver indicated understanding.  [X]          Patient/family have participated as able in goal setting and plan of care.  [X]          Patient/family agree to work toward stated goals and plan of care.  [ ]          Patient understands intent and goals of therapy, but is neutral about his/her participation.  [ ]          Patient is unable to participate in goal setting and plan of care.    Thank you for this referral.  Dayton Bailiff, PT   Time Calculation: 30 mins

## 2013-05-05 NOTE — Progress Notes (Signed)
Problem: Mobility Impaired (Adult and Pediatric)  Goal: *Acute Goals and Plan of Care (Insert Text)  Physical Therapy Goals  Initiated 05/05/2013    1. Patient will move from supine to sit and sit to supine in bed with modified independence within 4 days.  2. Patient will perform sit to stand with modified independence within 4 days.  3. Patient will ambulate with modified independence for 150 feet with the least restrictive device within 4 days.  4. Patient will ascend/descend 3 stairs with 1 handrail(s) with modified independence within 4 days.  5. Patient will verbalize and demonstrate understanding of spinal precautions (No bending, lifting greater than 5 lbs, or twisting; log-roll technique; frequent repositioning as instructed) within 4 days.  PHYSICAL THERAPY TREATMENT  Patient: Deanna Cortez (73 y.o. female)  Date: 05/05/2013  Diagnosis: SCIATICA, LOW BACK PAIN, DJD  Lumbago <principal problem not specified>  Procedure(s) (LRB):  L2-3 LATERAL INTERBODY FUSION  (N/A) 1 Day Post-Op  Precautions: Back (precautions, TLSO)      ASSESSMENT:  Patient is supine in bed and hoping for discharge this PM if she passes PT. She is currently at a supervision level for bed mobility, transfers, gait, and stairs. She was able to ambulate 200 feet with the rolling walker which she plans to use at home. She ascended and descended stairs without difficulty. Patient's daughter is present and supportive. Reviewed home safety and proper don/doffing of TLSO with patient and daughter.  Patient is safe and ready to go home. No follow up PT should be needed.   Progression toward goals:  [X]       Improving appropriately and progressing toward goals  [ ]       Improving slowly and progressing toward goals  [ ]       Not making progress toward goals and plan of care will be adjusted       PLAN:  Patient continues to benefit from skilled intervention to address the above impairments.  Continue treatment per established plan of care.  Discharge  Recommendations:  None  Further Equipment Recommendations for Discharge:  none       SUBJECTIVE:   Patient stated ???I am ready to leave. I feel much better.???   The patient stated 3/3 back precautions. Reviewed all 3 with patient.      OBJECTIVE DATA SUMMARY:   Critical Behavior:  Neurologic State: Alert  Orientation Level: Oriented X4  Cognition: Appropriate decision making, Appropriate for age attention/concentration, Appropriate safety awareness, Follows commands  Safety/Judgement: Awareness of environment, Good awareness of safety precautions (recalled 3/3 back precautions)  Functional Mobility Training:  Bed Mobility:  Log Rolling: Additional time;Supervision  Supine to Sit: Additional time;Supervision (logroll )  Sit to Supine: Additional time;Supervision (logroll)  Scooting: Additional time  Brace donned with  minimal assistance/contact guard assist - daughter is present and supportive.  Transfers:  Sit to Stand: Additional time;Supervision (Cues for hand placement.)  Stand to Sit: Additional time;Supervision  Balance:  Sitting: Intact  Standing: Intact;With support  Standing - Static: Good  Standing - Dynamic : Fair  Ambulation/Gait Training:  Distance (ft): 200 Feet (ft)  Assistive Device: Brace/Splint;Gait belt;Walker, rolling  Ambulation - Level of Assistance: Supervision/Set-up  Gait Abnormalities: Antalgic  Base of Support: Widened  Speed/Cadence: Slow  Step Length: Right shortened;Left shortened              No loss of balance or dizziness.  Stairs:  Number of Stairs Trained: 3  Stairs - Level of Assistance: Air traffic controller  Use: Left     Pain:  Pain Scale 1: Numeric (0 - 10)  Pain Intensity 1: 2  Pain Location 1: Back  Pain Orientation 1: Left;Lateral  Pain Description 1: Aching  Pain Intervention(s) 1: Encouraged PCA    After treatment:   [ ]   Patient left in no apparent distress sitting up in chair  [X]   Patient left in no apparent distress in bed  [X]   Call bell left within reach  [X]   Nursing  notified  [X]   Caregiver present  [ ]   Bed alarm activated      COMMUNICATION/COLLABORATION:   The patient???s plan of care was discussed with: Registered Nurse    Dayton BailiffKirsten A Cruzita Lipa, PT   Time Calculation: 23 mins

## 2013-05-05 NOTE — Discharge Summary (Signed)
Discharge Summary      Date of Surgery: 05/04/2013     Preoperative Diagnosis:  LUMBAR STENOSIS, SPONDYLOLITHESIS    Postoperative Diagnosis: LUMBAR STENOSIS, SPONDYLOLITHESIS    Procedure: Procedure(s):  L2-3 LATERAL INTERBODY FUSION      Surgeon: Primary: Kem Parkinson, MD    History and Hospital Course:  Deanna Cortez is a pleasant 73 y.o. year old female who has a history of previous fusion from L3 to L5 many years ago, now with a retrolisthesis and spondylolisthesis of L2-3 with moderate to severe facet hypertrophy and stenosis.  Having failed conservative treatment, the patient elected to undergo operative intervention.  She tolerated the procedure well and was admitted post-operatively for antibiotics and pain control.     On the morning of post-op day 1, the patient was doing well.  She had some complaints of incisional pain but was able to transition to oral pain medication.  She was started on a clear liquid diet.  By the afternoon of post-op day 1, the patient continued to progress well.  She was started on a regular diet and was allowed to started getting out of bed independently.  She was given written discharge instructions.     The patient will be discharged after lunch today if they are deemed medically stable and have been deemed safe for discharge by physical therapy.  The patient will be discharged to their home with prescriptions for pain medications.   The patient will follow-up in 10-14 days for repeat x-rays and a wound check.    Discharge Medications:  Current Discharge Medication List      START taking these medications    Details   oxyCODONE IR (ROXICODONE) 5 mg immediate release tablet Take 1-2 Tabs by mouth every four (4) hours as needed. Max Daily Amount: 60 mg.  Qty: 80 Tab, Refills: 0      potassium chloride (K-DUR, KLOR-CON) 10 mEq tablet Take 2 Tabs by mouth daily. Follow up with PCP for low potasium levels during hospitalization.  Qty: 14 Tab, Refills: 0         CONTINUE these  medications which have NOT CHANGED    Details   lansoprazole (PREVACID) 30 mg capsule Take  by mouth two (2) times a day.      desipramine (NORPRAMIN) 50 mg tablet Take 50 mg by mouth daily.      buPROPion XL (WELLBUTRIN XL) 150 mg tablet Take 150 mg by mouth every morning.      Bifidobacterium Infantis (ALIGN) 4 mg cap Take 4 mg by mouth daily.      prazosin (MINIPRESS) 2 mg capsule Take 2 mg by mouth three (3) times daily.      amLODIPine (NORVASC) 5 mg tablet Take 5 mg by mouth daily.      zolpidem (AMBIEN) 10 mg tablet Take 10 mg by mouth nightly as needed for Sleep.      chlorthalidone (HYGROTEN) 25 mg tablet Take 25 mg by mouth daily.      Desvenlafaxine (PRISTIQ) 100 mg Tb24 Take 100 mg by mouth daily.      gabapentin (NEURONTIN) 300 mg capsule Take 300 mg by mouth three (3) times daily.      timolol maleate 0.5 % drpd ophthalmic solution Administer 1 Drop to both eyes daily.      latanoprost (XALATAN) 0.005 % ophthalmic solution Administer 1 Drop to both eyes nightly.      atenolol (TENORMIN) 100 mg tablet Take 100 mg by mouth nightly.  clonazePAM (KLONOPIN) 0.5 mg tablet Take 0.5 mg by mouth nightly as needed.         STOP taking these medications       HYDROcodone-acetaminophen (NORCO) 5-325 mg per tablet Comments:   Reason for Stopping:         diclofenac EC (VOLTAREN) 75 mg EC tablet Comments:   Reason for Stopping:                 Signed By: Barbee Shropshireraci A Lenda Baratta, NP

## 2013-05-05 NOTE — Progress Notes (Addendum)
Problem: Self Care Deficits Care Plan (Adult)  Goal: *Acute Goals and Plan of Care (Insert Text)  Occupational Therapy Goals  Initiated 05/05/2013  1. Patient will perform upper body ADLs while standing ADLs 5 mins with modified independence within 7 days.  2. Patient will don/doff back brace at supervision/set-up within 7 days.  3. Patient will verbalize/demonstrate 3/3 back precautions during ADL tasks without cues within 7 days.   4. Patient will demonstrate toilet transfer with modified independence within 7 days.   OCCUPATIONAL THERAPY EVALUATION INCLUDING A FUNCTIONAL MEASURE  Patient: Deanna Cortez (73 y.o. female)  Date: 05/05/2013  Primary Diagnosis: SCIATICA, LOW BACK PAIN, DJD  Lumbago  Procedure(s) (LRB):  L2-3 LATERAL INTERBODY FUSION  (N/A) 1 Day Post-Op   Precautions:   Back      ASSESSMENT :  Based on the objective data described below, the patient presents with setup to moderate A ADLs, bed mobility - stand max A overall, to chair with min A RW. ADLs limited by back precautions, pain, dynamic balance, activity tolerance. Patient discharging to daughter's home and will have all of her AE there.    Patient will benefit from skilled intervention to address the above impairments.  Patient???s rehabilitation potential is considered to be Good  Factors which may influence rehabilitation potential include:   [X]              None noted  [ ]              Mental ability/status  [ ]              Medical condition  [ ]              Home/family situation and support systems  [ ]              Safety awareness  [ ]              Pain tolerance/management  [ ]              Other:        PLAN :  Recommendations and Planned Interventions:  [X]                Self Care Training                  [X]         Therapeutic Activities  [X]                Functional Mobility Training    [ ]         Cognitive Retraining  [X]                Therapeutic Exercises           [X]         Endurance Activities  [X]                Balance Training                    [ ]         Neuromuscular Re-Education  [ ]                Visual/Perceptual Training     [X]    Home Safety Training  [X]                Patient Education                 [X]         Family Training/Education  [ ]   Other (comment):    Frequency/Duration: Patient will be followed by occupational therapy 5 times a week to address goals.  Discharge Recommendations: None  Further Equipment Recommendations for Discharge: none noted       SUBJECTIVE:   Patient stated ???I have all of the equipment I could possibly need at home.???      OBJECTIVE DATA SUMMARY:       Past Medical History   Diagnosis Date   ??? Hypertension     ??? Unspecified sleep apnea         USES CPAP   ??? GERD (gastroesophageal reflux disease)     ??? Cancer         SQUAMOUS CELL CARCINOMA X 2   ??? Psychiatric disorder         DEPRESSION   ??? Chronic pain         BACK AND LEGS     Past Surgical History   Procedure Laterality Date   ??? Hx gyn   1977       TUBAL LIGATION   ??? Hx cholecystectomy   2004   ??? Hx orthopaedic   1994       CARPAL TUNNEL RT WRIST    ??? Hx orthopaedic   2010       DOUBLE LUMBAR FUSION   ??? Pr breast surgery procedure unlisted   2013       BREAST BIOPSY   ??? Hx colonoscopy   LAST ONE 2011   ??? Hx other surgical   1992/1994       LEEP PROCEDURE    ??? Hx other surgical   2009       ERCP   ??? Hx other surgical   2007/2009       SQUAMOUS CELL CARCINOMA REMOVED X 2     Prior Level of Function/Home Situation: modified independent with all ADLs, using AE 2* back pain, spouse died 2 months ago; going to stay at daughter's house at discharge  Home Situation  Home Environment: Private residence  # Steps to Enter: 2  One/Two Story Residence: One story  Living Alone: No  Support Systems: Child(ren), Family member(s), Friends \\ neighbors  Patient Expects to be Discharged to:: Private residence  Current DME Used/Available at Home: Therapist, occupational, Paediatric nurse, Commode, bedside, Adaptive bathing aides, Environmental consultant, rolling (toilet tongs)   Tub or Shower Type: Shower  [X]   Right hand dominant   [ ]   Left hand dominant  Cognitive/Behavioral Status:  Neurologic State: Alert  Orientation Level: Oriented X4  Cognition: Appropriate decision making;Appropriate for age attention/concentration;Appropriate safety awareness;Follows commands  Perception: Appears intact  Perseveration: No perseveration noted  Safety/Judgement: Awareness of environment;Good awareness of safety precautions (recalled 3/3 back precautions)  Skin: intact bandage and PIV R hand  Edema: intact however patient stating slightly R hand swelling, has ice pack   Vision/Perceptual:                           Acuity: Impaired near vision;Impaired far vision    Corrective Lenses: Glasses  Coordination:  Coordination: Within functional limits  Fine Motor Skills-Upper: Left Intact;Right Intact    Gross Motor Skills-Upper: Left Intact;Right Intact  Balance:  Sitting: Intact;Without support  Standing: Impaired;Without support (one hand on sturdy surface)  Standing - Static: Fair  Standing - Dynamic : Fair  Strength:  B UEs during ADLs  Strength: Within functional limits  Tone & Sensation:    Tone: Normal  Sensation: Intact (3rd digit R hand tingling s/p IV)                    Range of Motion:  B UEs   AROM: Within functional limits                       Functional Mobility and Transfers for ADLs:  Bed Mobility:  Rolling: Moderate assistance;Assist X2  Supine to Sit: Moderate assistance;Assist X2     Scooting: Supervision (instruction to not twist)  Transfers:  Sit to Stand: Moderate assistance (EOB)     Bed to Chair: Minimum assistance (RW)                                Toilet Transfer : Moderate assistance (infer from EOB and chair)                             Shower Transfer: Total assistance(dependent) (not safe at this time)                 ADL Assessment:  Feeding: Supervision/set up (setup foil)    Oral Facial Hygiene/Grooming: Supervision/set up (setup)    Bathing: Moderate  assistance (A with back, knees-feet, states uses long handled sponge)    Upper Body Dressing: Moderate assistance (physical A TLSO brace)    Lower Body Dressing: Moderate assistance (states uses long handled AE at home)    Toileting: Moderate assistance (patient states uses wet wipes and toilet tongs baseline)              ADL Intervention:                                     Cognitive Retraining  Safety/Judgement: Awareness of environment;Good awareness of safety precautions (recalled 3/3 back precautions)    Functional Measure:  Barthel Index:      Bathing: 0  Bladder: 10  Bowels: 10  Grooming: 5  Dressing: 5  Feeding: 5  Grooming: 5  Mobility: 5  Stairs: 0  Toilet Use: 5  Transfer (Bed to Chair and Back): 5  Total: 50        Barthel and G-code impairment scale:  Percentage of impairment CH  0% CI  1-19% CJ  20-39% CK  40-59% CL  60-79% CM  80-99% CN  100%   Barthel Score 0-100 100 99-80 79-60 59-40 20-39 1-19    0   Barthel Score 0-20 20 17-19 13-16 9-12 5-8 1-4 0      The Barthel ADL Index: Guidelines  1. The index should be used as a record of what a patient does, not as a record of what a patient could do.  2. The main aim is to establish degree of independence from any help, physical or verbal, however minor and for whatever reason.  3. The need for supervision renders the patient not independent.  4. A patient's performance should be established using the best available evidence. Asking the patient, friends/relatives and nurses are the usual sources, but direct observation and common sense are also important. However direct testing is not needed.  5. Usually the patient's performance over the preceding 24-48 hours is important, but occasionally longer periods will be  relevant.  6. Middle categories imply that the patient supplies over 50 per cent of the effort.  7. Use of aids to be independent is allowed.    Clarisa KindredMahoney, F.l., Barthel, D.W. [phone](1965). Functional evaluation: the Barthel Index. Md State Med J (14)2.   Zenaida NieceVan der De BorgiaPutten, J.J.M.F, WintersHobart, Ian MalkinJ.C., Margret ChanceFreeman, J.A., [place], [place].J. (1999). Measuring the change indisability after inpatient rehabilitation; comparison of the responsiveness of the Barthel Index and Functional Independence Measure. Journal of Neurology, Neurosurgery, and Psychiatry, 66(4), [phone].  Dawson BillsVan Exel, N.J.A, Scholte op McAlmontReimer,  W.J.M, & Koopmanschap, M.A. (2004.) Assessment of post-stroke quality of life in cost-effectiveness studies: The usefulness of the Barthel Index and the EuroQoL-5D. Quality of Life Research, 3513, [phone]             Therapeutic Exercise:  Patient instructed on the benefits and demonstrated UE/back exercises with supervision, how to progress reps and sets against gravity to then working up to 5 lbs until, surgeon clears, in which can use household items for weights. How to increase independence, ability to perform ADLs, OOB tolerance.         Pain:  Pain Scale 1: Numeric (0 - 10)  Pain Intensity 1: 2  Pain Location 1: Back  Pain Orientation 1: Left;Lateral  Pain Description 1: Aching  Pain Intervention(s) 1: Encouraged PCA  Activity Tolerance:   Elevated BP 155-182 systolic  Please refer to the flowsheet for vital signs taken during this treatment.  After treatment:   [X]  Patient left in no apparent distress sitting up in chair  [ ]  Patient left in no apparent distress in bed  [X]  Call bell left within reach  [X]  Nursing notified  [X]  Caregiver present  [ ]  Bed alarm activated      COMMUNICATION/EDUCATION:   The patient???s plan of care was discussed with: Physical Therapist and Registered Nurse.  [X]  Home safety education was provided and the patient/caregiver indicated understanding.  [X]  Patient/family have participated as able in goal setting and plan of care.  [X]  Patient/family agree to work toward stated goals and plan of care.  [ ]  Patient understands intent and goals of therapy, but is neutral about his/her participation.  [ ]  Patient is unable to participate in goal setting  and plan of care.  This patient???s plan of care is appropriate for delegation to OTA.    Thank you for this referral.  Margaretha SeedsJacqueline R Cherry  Time Calculation: 34 mins

## 2013-05-05 NOTE — Other (Signed)
Chart reviewed for medical necessity, will follow for potential discharge needs.  Tamsen Reist, RN

## 2013-05-05 NOTE — Other (Signed)
[facility] Ortho Education Note    Recommendations/ Comments: Please consider:  1. Checking BGs AC and HS to be able to monitor.  2. starting Humalog High Sensitivity correction scale AC and HS for BG control.     Chart reviewed and initial evaluation complete on Deanna Cortez.      Patient is a 73 y.o. female with no history of Diabetes.       Reports having been physically active at home prior to admission. States she has been dealing with a very stressful time of her life since December 2014 and has not been eating adequate meals and snacking on cookies/crackers throughout the day instead.     Discussed how CHO, protein, and exercise effect BGs. Encouraged to consume a Glucerna/Boost Glucose control shake instead of skipping meals.     A1c:   Lab Results   Component Value Date/Time    Hemoglobin A1c 7.2 04/20/2013 10:45 AM       Assessed and instructed patient on the following:   ?? risk of wound infection/ delayed healing   ?? interpretation of lab results, blood sugar goals, exercise and nutrition.    Encouraged the following:  - increased exercise: as tolerated and as MD directs  - dietary modifications: eat 3 meals per day per Plate Method and avoid sugar sweetened beverages  - regular blood sugar monitoring: 1-2 times daily and notify MD if BGs remain elevated  - Make a f/u appt with PCP for DM management. Pt states she has an appt with PCP in April.       Provided patient with the following: [x]            Survival skills education materials               []            Insulin education materials               []            CHO counting education materials               []            BG guidelines for post-op patients                  [x]            Outpatient [facility] contact number               []            Glucometer               []            Insulin start kit- vial/syringe               []            Insulin start kit- pen                   Patient was able to give return demonstration of    [x]     Glucometer:  Accucheck Nano        Recent Glucose Results:   Lab Results   Component Value Date/Time    GLU 126* 05/05/2013  5:00 AM        Lab Results   Component Value Date/Time    Creatinine 0.78 05/05/2013  5:00 AM       Active Orders  Diet    DIET REGULAR            Current hospital DM medication: None    Will continue to follow as needed.    Thank you.     Luna Kitchens RN,BSN,CDE

## 2013-05-05 NOTE — Progress Notes (Signed)
Orthopedic Spine Progress Note  Post Op day: 1 Day Post-Op    May 05, 2013 10:28 AM   Admit Date: 05/04/2013  Procedure: Procedure(s):  L2-3 LATERAL INTERBODY FUSION     Subjective:     Deanna Cortez has complaints of back pain with movement. OK when still. Some N this morning. Short walk with PT this morning. No new issues.   Tolerating diet.  No N/V.    Pain Control:   Pain Assessment  Pain Scale 1: Numeric (0 - 10)  Pain Intensity 1: 2  Pain Onset 1: postop  Pain Location 1: Back  Pain Orientation 1: Left, Lateral  Pain Description 1: Aching  Pain Intervention(s) 1: Encouraged PCA    Objective:          Physical Exam:  General:  Alert and oriented. No acute distress.   Heart:  Respirations unlabored.   Abdomen:   Extremities: Soft, non-tender.  No evidence of cyanosis. Pulses palpable in both upper and lower extremities.       Neurologic:  Musculoskeletal:  No new motor deficits. Neurovascular exam within normal limits.  Sensation stable.  Motor: unchanged C5-T1 and L2-S1.   Homan's sign negative in bilateral lower extremities.  Calves soft, nontender upon palpation and with passive twitch.  Moves both upper and lower extremities.   Incision: clean, dry, and intact.  No significant erythema or swelling.  No active drainage noted.        Vital Signs:    Blood pressure 122/46, pulse 80, temperature 98.6 ??F (37 ??C), resp. rate 16, height 5\' 6"  (1.676 m), weight 121.564 kg (268 lb), SpO2 97 %.  Temp (24hrs), Avg:98.1 ??F (36.7 ??C), Min:97.7 ??F (36.5 ??C), Max:98.9 ??F (37.2 ??C)      LAB:    Recent Labs      05/05/13   0500   HGB  10.2*     Lab Results   Component Value Date/Time    Sodium 138 05/05/2013  5:00 AM    Potassium 3.1 05/05/2013  5:00 AM    Chloride 100 05/05/2013  5:00 AM    CO2 31 05/05/2013  5:00 AM    Glucose 126 05/05/2013  5:00 AM    BUN 9 05/05/2013  5:00 AM    Creatinine 0.78 05/05/2013  5:00 AM    Calcium 7.9 05/05/2013  5:00 AM       Intake/Output:   02/24 1900 - 02/26 0659  In: 2191.7 [I.V.:2191.7]  Out:  2540 [Urine:2490]    PT/OT:   Gait:                    Assessment:   Patient is 1 Day Post-Op s/p Procedure(s):  L2-3 LATERAL INTERBODY FUSION     Plan:     1.  Continue PT/OT  2.  Continue established methods of pain control - transition to PO pain management  3.  VTE Prophylaxes - TEDS &/or SCDs   4.  REmove Foley  5. May Hep-lock IV if drinking enough fluids  6.  Discharge pending but probably on Friday      Signed By: Harle StanfordJames M Rhylan Kagel, PA

## 2013-06-08 HISTORY — PX: OTHER SURGICAL HISTORY: SHX169

## 2013-06-09 ENCOUNTER — Encounter

## 2013-06-16 LAB — PROTHROMBIN TIME + INR
INR: 1 (ref 0.9–1.1)
Prothrombin time: 9.8 s (ref 9.0–11.1)

## 2013-06-16 LAB — METABOLIC PANEL, BASIC
Anion gap: 10 mmol/L (ref 5–15)
BUN/Creatinine ratio: 21 — ABNORMAL HIGH (ref 12–20)
BUN: 12 MG/DL (ref 6–20)
CO2: 29 mmol/L (ref 21–32)
Calcium: 8.9 MG/DL (ref 8.5–10.1)
Chloride: 97 mmol/L (ref 97–108)
Creatinine: 0.57 MG/DL (ref 0.45–1.15)
GFR est AA: >60 mL/min/{1.73_m2} (ref >60)
GFR est non-AA: >60 mL/min/{1.73_m2} (ref >60)
Glucose: 157 mg/dL — ABNORMAL HIGH (ref 65–100)
Potassium: 3.3 mmol/L — ABNORMAL LOW (ref 3.5–5.1)
Sodium: 136 mmol/L (ref 136–145)

## 2013-06-16 LAB — URINALYSIS W/ REFLEX CULTURE
Bilirubin: NEGATIVE
Blood: NEGATIVE
Glucose: NEGATIVE mg/dL
Ketone: NEGATIVE mg/dL
Nitrites: POSITIVE — AB
Protein: NEGATIVE mg/dL
Specific gravity: 1.015 (ref 1.003–1.030)
Urobilinogen: 1 EU/dL (ref 0.2–1.0)
pH (UA): 6.5 (ref 5.0–8.0)

## 2013-06-16 LAB — CBC W/O DIFF
HCT: 38.2 % (ref 35.0–47.0)
HGB: 11.5 g/dL (ref 11.5–16.0)
MCH: 26.4 PG (ref 26.0–34.0)
MCHC: 30.1 g/dL (ref 30.0–36.5)
MCV: 87.8 FL (ref 80.0–99.0)
PLATELET: 295 10*3/uL (ref 150–400)
RBC: 4.35 M/uL (ref 3.80–5.20)
RDW: 13.4 % (ref 11.5–14.5)
WBC: 6.1 10*3/uL (ref 3.6–11.0)

## 2013-06-16 LAB — HEMOGLOBIN A1C WITH EAG
Est. average glucose: 134 mg/dL
Hemoglobin A1c: 6.3 % (ref 4.2–6.3)

## 2013-06-16 LAB — TYPE & SCREEN
ABO/Rh(D): O POS
Antibody screen: NEGATIVE

## 2013-06-16 LAB — TYPE AND SCREEN
ABO/Rh: O POS
Antibody Screen: NEGATIVE

## 2013-06-16 MED ORDER — GADOBUTROL 15 MMOL/15 ML (1 MMOL/ML) IV
15 mmol/ mL (1 mmol/mL) | Freq: Once | INTRAVENOUS | Status: AC
Start: 2013-06-16 — End: 2013-06-16
  Administered 2013-06-16: 14:00:00 via INTRAVENOUS

## 2013-06-16 MED FILL — GADAVIST 15 MMOL/15 ML (1 MMOL/ML) INTRAVENOUS SOLUTION: 15 mmol/ mL (1 mmol/mL) | INTRAVENOUS | Qty: 15

## 2013-06-16 NOTE — Other (Signed)
[name] is a pleasant 73 year old patient of Dr. Kem ParkinsonJed Vanichkachorn who presents for pre-op evaluation and H&P prior to scheduled lumbar laminectomy with instrumentation on 06/21/13.  She reports onset of back problems as 3 years ago and describes her pain as constant exploding, sharp pain partially relieved with medication and application of heat,  and aggravated by any activity.  She rates the pain 9/10 on most days. She has a multitude of allergies.  METS<4.  BMI: 42.7.  Her medical history includes hypertension, sleep apnea with use of CPAP; arthritis, and GERD. Patient denies travel to [place] or any area where EVD transmission has been [facility] and denies close contact with anyone traveling to those areas in the past 21 days.  Patient also denies diarrhea, vomiting, fever, severe headache, and muscle pain.    Pre-Op instructions discussed with and given to atient.   SSI sheet given to patient and discussed. MRSA/MSSA information sheet discussed and given to patient.  Patient instructed she should take her Amlodipine, Atenolol, Buproprion, Pristiq, Minipress with a sip of water morning of surgery, use her Timolol eye drops and bring both Latanoprost and Timolol eye drops to the hospital day of surgery.  She was instructed to stop over the counter pain meds and NSAIDS such as Advil, Ibuprofen, Alleve, herbal products, Vit E, glucosamine chondroitin, and fish oil 5 days before surgery and aspirin and aspirin containing products 10 days before surgery. She was instructed to bring her CPAP morning of surgery. Patient was instructed NPO after midnight the night before surgery. Handwashing was discussed with patient and handwashing instruction sheet was given.  Patient was given CHO wipes and instruction sheet with directions for use. She verbalized understanding and was given the opportunity to ask questions.

## 2013-06-16 NOTE — H&P (Signed)
Orthopedic Generic Pre-Op History and Physical    Subjective:     [name] is a pleasant 72 year old patient of Dr. Kem Parkinson who presents for pre-op evaluation and H&P prior to scheduled lumbar laminectomy with instrumentation on 06/21/13.  She reports onset of back problems as 3 years ago and describes her pain as constant exploding, sharp pain partially relieved with medication and application of heat,  and aggravated by any activity.  She rates the pain 9/10 on most days. She has a multitude of allergies.  METS<4.  BMI: 42.7.  Her medical history includes hypertension, sleep apnea with use of CPAP; arthritis, and GERD. Patient denies travel to [place] or any area where EVD transmission has been reported by [facility] and denies close contact with anyone traveling to those areas in the past 21 days.  Patient also denies diarrhea, vomiting, fever, severe headache, and muscle pain.      Patient Active Problem List    Diagnosis Date Noted   ??? Lumbago 05/04/2013     Past Medical History   Diagnosis Date   ??? Hypertension    ??? Unspecified sleep apnea      USES CPAP   ??? GERD (gastroesophageal reflux disease)    ??? Psychiatric disorder      DEPRESSION   ??? Chronic pain      BACK AND LEGS   ??? Morbid obesity (HCC)    ??? Arthritis    ??? Cancer (HCC)      SQUAMOUS CELL CARCINOMA X 2   ??? Breast CA (HCC) 04/2011     left breast cancer      Past Surgical History   Procedure Laterality Date   ??? Hx gyn  1977     TUBAL LIGATION   ??? Hx cholecystectomy  2004   ??? Pr breast surgery procedure unlisted  2013     BREAST BIOPSY   ??? Hx colonoscopy  LAST ONE 2011   ??? Hx other surgical  1992/1994     LEEP PROCEDURE    ??? Hx other surgical  2009     ERCP   ??? Hx other surgical  2007/2009     SQUAMOUS CELL CARCINOMA REMOVED X 2   ??? Hx gi       colonoscopy   ??? Hx orthopaedic  1994     CARPAL TUNNEL RT WRIST    ??? Hx orthopaedic  2010     DOUBLE LUMBAR FUSION   ??? Hx orthopaedic  04/2013     laminectomy      Prior to Admission medications     Medication Sig Start Date End Date Taking? Authorizing Provider   carisoprodol (SOMA) 250 mg tablet Take 250 mg by mouth every eight (8) hours as needed for Muscle Spasm(s).   Yes Historical Provider   oxyCODONE IR (ROXICODONE) 5 mg immediate release tablet Take 1-2 Tabs by mouth every four (4) hours as needed. Max Daily Amount: 60 mg. 05/05/13  Yes Barbee Shropshire, NP   lansoprazole (PREVACID) 30 mg capsule Take  by mouth two (2) times a day.   Yes Historical Provider   desipramine (NORPRAMIN) 50 mg tablet Take 50 mg by mouth daily.   Yes Historical Provider   buPROPion XL (WELLBUTRIN XL) 150 mg tablet Take 150 mg by mouth every morning.   Yes Historical Provider   Bifidobacterium Infantis (ALIGN) 4 mg cap Take 4 mg by mouth daily.   Yes Historical Provider   prazosin (  MINIPRESS) 2 mg capsule Take 2 mg by mouth three (3) times daily.   Yes Historical Provider   amLODIPine (NORVASC) 5 mg tablet Take 5 mg by mouth daily.   Yes Historical Provider   atenolol (TENORMIN) 100 mg tablet Take 100 mg by mouth nightly.   Yes Historical Provider   zolpidem (AMBIEN) 10 mg tablet Take 10 mg by mouth nightly as needed for Sleep.   Yes Historical Provider   chlorthalidone (HYGROTEN) 25 mg tablet Take 25 mg by mouth daily.   Yes Historical Provider   Desvenlafaxine (PRISTIQ) 100 mg Tb24 Take 100 mg by mouth daily.   Yes Historical Provider   clonazePAM (KLONOPIN) 0.5 mg tablet Take 0.5 mg by mouth nightly as needed.   Yes Historical Provider   gabapentin (NEURONTIN) 300 mg capsule Take 300 mg by mouth three (3) times daily.   Yes Historical Provider   timolol maleate 0.5 % drpd ophthalmic solution Administer 1 Drop to both eyes daily.   Yes Historical Provider   latanoprost (XALATAN) 0.005 % ophthalmic solution Administer 1 Drop to both eyes nightly.   Yes Historical Provider     Allergies   Allergen Reactions   ??? Celebrex [Celecoxib] Other (comments)     Hypertensive episode     ??? Amitriptyline Unable to Obtain   ??? Arthrotec 50  [Diclofenac-Misoprostol] Unknown (comments)   ??? Augmentin [Amoxicillin-Pot Clavulanate] Unable to Obtain   ??? Cephalexin Unable to Obtain     Tolerates cefazolin with no problems   ??? Ciprofloxacin Swelling     SWELLING OF THE TONGUE   ??? Crestor [Rosuvastatin] Unable to Obtain   ??? Flexeril [Cyclobenzaprine] Swelling     FEET SWELLING   ??? Lescol [Fluvastatin] Other (comments)     UPPER LEG PAIN   ??? Levaquin [Levofloxacin] Unable to Obtain   ??? Lyrica [Pregabalin] Other (comments) and Vertigo     SWELLING OF FACE,THROAT,  AND TROUBLE BREATHING   ??? Meloxicam Swelling     SWELLING OF THE FEET   ??? Metaxalone Other (comments)     PERSISTENT SORE THROAT, FACE RED AND FELT "ON FIRE"   ??? Mirapex [Pramipexole] Unable to Obtain   ??? Other Medication Unable to Obtain     Prempro   ??? Pravachol [Pravastatin] Other (comments)     UPPER LEG PAIN   ??? Reglan [Metoclopramide] Unable to Obtain   ??? Simvastatin Unable to Obtain   ??? Sulfur Unable to Obtain   ??? Tramadol Unable to Obtain   ??? Vioxx [Rofecoxib] Unable to Obtain   ??? Darvocet A500 [Propoxyphene N-Acetaminophen] Nausea and Vomiting   ??? Erythromycin Nausea and Vomiting   ??? Nexium [Esomeprazole Magnesium] Nausea Only   ??? Zoloft [Sertraline] Other (comments)     Teeth sensitivity      History   Substance Use Topics   ??? Smoking status: Never Smoker    ??? Smokeless tobacco: Never Used   ??? Alcohol Use: No      Family History   Problem Relation Age of Onset   ??? Arthritis-osteo Mother    ??? Hypertension Mother    ??? Cancer Father      PROSTATE   ??? Anesth Problems Neg Hx    ??? Cataract Sister      BRAIN TUMOR   ??? Other Sister      SPINAL SURGERY         Review of Systems   Constitutional: Positive for activity change. Negative for fever, chills, diaphoresis,  appetite change, fatigue and unexpected weight change.   HENT: Negative for congestion, dental problem, drooling, ear discharge, ear pain, facial swelling, hearing loss, mouth sores, nosebleeds, postnasal drip, rhinorrhea, sinus pressure,  sneezing, sore throat, tinnitus and trouble swallowing.    Eyes: Negative for photophobia, pain, discharge, redness, itching and visual disturbance.   Respiratory: Positive for apnea. Negative for cough, choking, chest tightness, shortness of breath, wheezing and stridor.         Hx of sleep apnea with use of CPAP.   Cardiovascular: Negative.  Negative for chest pain, palpitations and leg swelling.   Gastrointestinal: Negative for nausea, vomiting, abdominal pain, diarrhea, constipation, blood in stool, abdominal distention and anal bleeding.   Endocrine: Negative for cold intolerance, heat intolerance, polydipsia, polyphagia and polyuria.   Genitourinary: Negative for dysuria, urgency, frequency, flank pain, decreased urine volume, difficulty urinating, genital sores and pelvic pain.   Musculoskeletal: Positive for back pain, arthralgias and gait problem. Negative for myalgias, joint swelling, neck pain and neck stiffness.   Skin: Negative.  Negative for color change, pallor, rash and wound.   Allergic/Immunologic: Negative for environmental allergies.        Multiple allergies   Neurological: Negative for dizziness, tremors, seizures, syncope, speech difficulty, weakness, light-headedness, numbness and headaches.   Hematological: Negative.    Psychiatric/Behavioral: Negative for suicidal ideas.        Hx of depression       Objective:     Patient Vitals for the past 8 hrs:   Temp Pulse Resp Height Weight   06/16/13 1444 98.4 ??F (36.9 ??C) 96 18 5' 5.75" (1.67 m) 118.842 kg (262 lb)       Physical Exam   Constitutional: She appears well-nourished. No distress.   BMI:  42.7.  METS<4.   HENT:   Head: Normocephalic and atraumatic.   Right Ear: External ear normal.   Left Ear: External ear normal.   Nose: Nose normal.   Mouth/Throat: Oropharynx is clear and moist. No oropharyngeal exudate.   Eyes: Conjunctivae and EOM are normal. Pupils are equal, round, and reactive to light. Right eye exhibits no discharge. Left eye  exhibits no discharge. No scleral icterus.   Neck: Normal range of motion. Neck supple. No JVD present. No tracheal deviation present. No thyromegaly present.   Cardiovascular: Regular rhythm, normal heart sounds and intact distal pulses.  Exam reveals no gallop and no friction rub.    No murmur heard.  Pulmonary/Chest: Effort normal and breath sounds normal. No stridor. No respiratory distress. She has no wheezes. She has no rales. She exhibits no tenderness.   Abdominal: Soft. Bowel sounds are normal. She exhibits no distension and no mass. There is no tenderness. There is no rebound and no guarding.   Musculoskeletal: She exhibits no edema or tenderness.   Decreased ROM to back.   Lymphadenopathy:     She has no cervical adenopathy.   Neurological: She is alert. She has normal reflexes.   Skin: Skin is warm and dry. No rash noted. She is not diaphoretic. No erythema. No pallor.   Psychiatric: She has a normal mood and affect.       Assessment:     Pre-op evaluation and H&P completed.  Chronic back pain.    Plan:     Pre-op diagnostic testing ordered and completed per PAT Orthopedic Protocol.  Results will be communicated to Dr. Raylene MiyamotoVanichkachorn.  Pre-Op instructions discussed with and given to atient.   SSI sheet given to  patient and discussed. MRSA/MSSA information sheet discussed and given to patient.  Patient instructed she should take her Amlodipine, Atenolol, Buproprion, Pristiq, Minipress with a sip of water morning of surgery, use her Timolol eye drops and bring both Latanoprost and Timolol eye drops to the hospital day of surgery.  She was instructed to stop over the counter pain meds and NSAIDS such as Advil, Ibuprofen, Alleve, herbal products, Vit E, glucosamine chondroitin, and fish oil 5 days before surgery and aspirin and aspirin containing products 10 days before surgery. She was instructed to bring her CPAP morning of surgery. Patient was instructed NPO after midnight the night before surgery.  Handwashing was discussed with patient and handwashing instruction sheet was given.  Patient was given CHO wipes and instruction sheet with directions for use. She verbalized understanding and was given the opportunity to ask questions.    Reece Leader, NP

## 2013-06-17 LAB — EKG, 12 LEAD, INITIAL
Atrial Rate: 83 {beats}/min
Calculated P Axis: 8 degrees
Calculated R Axis: 17 degrees
Calculated T Axis: 29 degrees
P-R Interval: 172 ms
Q-T Interval: 396 ms
QRS Duration: 98 ms
QTC Calculation (Bezet): 465 ms
Ventricular Rate: 83 {beats}/min

## 2013-06-17 LAB — CULTURE, MRSA

## 2013-06-17 NOTE — Other (Signed)
Reviewed and faxed lab results to Dr. Alyse LowVanichkachorn's office.  Low K+ called to [facility].  MRSA/MSSA result is pending.

## 2013-06-18 LAB — CULTURE, URINE
Colonies Counted: >100000
Colony Count: >100000

## 2013-06-20 NOTE — Other (Signed)
Urine culture and sensitivity [facility]. Alyse Low[facility]ichkachorn's office noting patient needs treatment.  Negative MRSA/MSSA culture faxed as well.

## 2013-06-21 ENCOUNTER — Inpatient Hospital Stay
Admit: 2013-06-21 | Discharge: 2013-06-24 | Disposition: A | Discharge status: Rehabilitation Facility | Payer: MEDICARE | Attending: Specialist | Admitting: Specialist

## 2013-06-21 DIAGNOSIS — M48061 Spinal stenosis, lumbar region without neurogenic claudication: Secondary | ICD-10-CM

## 2013-06-21 MED ORDER — SODIUM CHLORIDE 0.9 % IJ SYRG
INTRAMUSCULAR | Status: DC | PRN
Start: 2013-06-21 — End: 2013-06-21

## 2013-06-21 MED ORDER — SODIUM CHLORIDE 0.9 % IJ SYRG
Freq: Three times a day (TID) | INTRAMUSCULAR | Status: DC
Start: 2013-06-21 — End: 2013-06-21

## 2013-06-21 MED ORDER — LIDOCAINE (PF) 10 MG/ML (1 %) IJ SOLN
10 mg/mL (1 %) | INTRAMUSCULAR | Status: DC | PRN
Start: 2013-06-21 — End: 2013-06-21

## 2013-06-21 MED ADMIN — vancomycin (VANCOCIN) 2000 mg in NS 500 ml infusion: INTRAVENOUS | @ 21:00:00 | NDC 09999945502

## 2013-06-21 MED ADMIN — ketamine (KETALAR) 50 mg/mL injection: INTRAVENOUS | @ 21:00:00 | NDC 55390047510

## 2013-06-21 MED ADMIN — gelatin absorbable (GELFOAM) 100 sponge: TOPICAL | @ 22:00:00 | NDC 00009034201

## 2013-06-21 MED ADMIN — 0.9% sodium chloride infusion: INTRAVENOUS | @ 22:00:00 | NDC 00409798348

## 2013-06-21 MED ADMIN — tranexamic acid (CYKLOKAPRON) 1 g in 0.9% sodium chloride 110 mL IVPB: INTRAVENOUS | @ 22:00:00 | NDC 00517096010

## 2013-06-21 MED ADMIN — rocuronium (ZEMURON) injection: INTRAVENOUS | @ 21:00:00 | NDC 10139023505

## 2013-06-21 MED ADMIN — ketamine (KETALAR) 50 mg/mL injection: INTRAVENOUS | @ 23:00:00 | NDC 55390047510

## 2013-06-21 MED ADMIN — lactated ringers infusion: INTRAVENOUS | @ 23:00:00 | NDC 00338011704

## 2013-06-21 MED ADMIN — 0.9% sodium chloride infusion: INTRAVENOUS | @ 23:00:00 | NDC 00409798348

## 2013-06-21 MED ADMIN — HYDROmorphone (PF) (DILAUDID) injection: INTRAVENOUS | @ 21:00:00 | NDC 59011044225

## 2013-06-21 MED ADMIN — rocuronium (ZEMURON) injection: INTRAVENOUS | @ 22:00:00 | NDC 10139023505

## 2013-06-21 MED ADMIN — propofol (DIPRIVAN) 10 mg/mL injection: INTRAVENOUS | @ 21:00:00 | NDC 63323026920

## 2013-06-21 MED ADMIN — fentaNYL citrate (PF) injection: INTRAVENOUS | @ 21:00:00 | NDC 00409909332

## 2013-06-21 MED ADMIN — ondansetron (ZOFRAN) injection: INTRAVENOUS | NDC 23155037831

## 2013-06-21 MED ADMIN — midazolam (VERSED) injection: INTRAVENOUS | @ 21:00:00 | NDC 25021065502

## 2013-06-21 MED ADMIN — succinylcholine (ANECTINE) injection: INTRAVENOUS | @ 21:00:00 | NDC 00409662902

## 2013-06-21 MED ADMIN — lactated ringers infusion: INTRAVENOUS | @ 21:00:00 | NDC 00409795309

## 2013-06-21 MED ADMIN — bacitracin 50,000 Units in 0.9% sodium chloride 3,000 mL Irrigation: @ 22:00:00 | NDC 00409798309

## 2013-06-21 MED ADMIN — lidocaine (PF) (XYLOCAINE) 20 mg/mL (2 %) injection: INTRAVENOUS | @ 21:00:00 | NDC 63323049507

## 2013-06-21 MED ADMIN — lactated ringers infusion: INTRAVENOUS | @ 21:00:00 | NDC 00338011704

## 2013-06-21 MED ADMIN — thrombin (THROMBOGEN) topical solution: TOPICAL | @ 22:00:00 | NDC 60793021720

## 2013-06-21 MED ADMIN — HYDROmorphone (PF) (DILAUDID) injection: INTRAVENOUS | @ 23:00:00 | NDC 59011044225

## 2013-06-21 MED ADMIN — fentaNYL citrate (PF) injection: INTRAVENOUS | @ 22:00:00 | NDC 00409909332

## 2013-06-21 MED ADMIN — dexamethasone (DECADRON) 4 mg/mL injection: INTRAVENOUS | @ 23:00:00 | NDC 63323016501

## 2013-06-21 MED FILL — PROPOFOL 10 MG/ML IV EMUL: 10 mg/mL | INTRAVENOUS | Qty: 100

## 2013-06-21 MED FILL — SODIUM CHLORIDE 0.9 % IV: INTRAVENOUS | Qty: 1000

## 2013-06-21 MED FILL — DEXAMETHASONE SODIUM PHOSPHATE 4 MG/ML IJ SOLN: 4 mg/mL | INTRAMUSCULAR | Qty: 2

## 2013-06-21 MED FILL — SUCCINYLCHOLINE CHLORIDE 140 MG/7 ML (20 MG/ML) SYR: 140 mg/7 mL (20 mg/mL) | INTRAVENOUS | Qty: 7

## 2013-06-21 MED FILL — SODIUM CHLORIDE 0.9 % INJECTION: INTRAMUSCULAR | Qty: 10

## 2013-06-21 MED FILL — FENTANYL CITRATE (PF) 50 MCG/ML IJ SOLN: 50 mcg/mL | INTRAMUSCULAR | Qty: 5

## 2013-06-21 MED FILL — SENSORCAINE-MPF/EPINEPHRINE 0.5 %-1:200,000 INJECTION SOLUTION: 0.5 %-1:200,000 | INTRAMUSCULAR | Qty: 30

## 2013-06-21 MED FILL — DIPRIVAN 10 MG/ML INTRAVENOUS EMULSION: 10 mg/mL | INTRAVENOUS | Qty: 20

## 2013-06-21 MED FILL — DILAUDID (PF) 2 MG/ML INJECTION SOLUTION: 2 mg/mL | INTRAMUSCULAR | Qty: 1

## 2013-06-21 MED FILL — ROCURONIUM 10 MG/ML IV: 10 mg/mL | INTRAVENOUS | Qty: 5

## 2013-06-21 MED FILL — XYLOCAINE-MPF 20 MG/ML (2 %) INJECTION SOLUTION: 20 mg/mL (2 %) | INTRAMUSCULAR | Qty: 5

## 2013-06-21 MED FILL — THROMBIN-JMI 20,000 UNIT TOPICAL SOLUTION: 20000 unit | CUTANEOUS | Qty: 20000

## 2013-06-21 MED FILL — KETAMINE 10 MG/ML IJ SOLN: 10 mg/mL | INTRAMUSCULAR | Qty: 20

## 2013-06-21 MED FILL — PROPOFOL 10 MG/ML IV EMUL: 10 mg/mL | INTRAVENOUS | Qty: 50

## 2013-06-21 MED FILL — ONDANSETRON (PF) 4 MG/2 ML INJECTION: 4 mg/2 mL | INTRAMUSCULAR | Qty: 2

## 2013-06-21 MED FILL — GELFOAM SPONGE SIZE 100 100: 100 | CUTANEOUS | Qty: 1

## 2013-06-21 MED FILL — TRANEXAMIC ACID 1,000 MG/10 ML (100 MG/ML) IV: 1000 mg/10 mL (100 mg/mL) | INTRAVENOUS | Qty: 10

## 2013-06-21 MED FILL — MIDAZOLAM 1 MG/ML IJ SOLN: 1 mg/mL | INTRAMUSCULAR | Qty: 5

## 2013-06-21 MED FILL — ROCURONIUM 10 MG/ML IV: 10 mg/mL | INTRAVENOUS | Qty: 10

## 2013-06-21 MED FILL — VANCOMYCIN IN 0.9 % SODIUM CHLORIDE 2 GRAM/500 ML IV: 2 gram/500 mL | INTRAVENOUS | Qty: 500

## 2013-06-21 MED FILL — LACTATED RINGERS IV: INTRAVENOUS | Qty: 1000

## 2013-06-21 MED FILL — BACITRACIN 50,000 UNIT IM: 50000 unit | INTRAMUSCULAR | Qty: 50000

## 2013-06-21 NOTE — Progress Notes (Signed)
TRANSFER - OUT REPORT:    Verbal report given to Victorino DikeJennifer on Deanna BostonDiana Cortez  being transferred to [address] for routine progression of care       Report consisted of patient???s Situation, Background, Assessment and   Recommendations(SBAR).     Time Pre op antibiotic given:1714  Anesthesia Stop time: 2056    Information from the following report(s) SBAR, Kardex, OR Summary, Procedure Summary, Intake/Output, MAR, Recent Results and Med Rec Status was reviewed with the receiving nurse.    Opportunity for questions and clarification was provided.     Is the patient on 02? YES       L/Min 2       Other     Is the patient on a monitor? YES    Is the nurse transporting with the patient? YES    Surgical Waiting Area notified of patient's transfer from PACU? YES      The following personal items collected during your admission accompanied patient upon transfer:   Dental Appliance: Dental Appliances: None  Vision: Visual Aid: Glasses  Hearing Aid:    Jewelry:    Clothing: Clothing: Other (comment) (clothes sent to pacu)  Other Valuables: Other Valuables: Other (comment) (brace sent to or)  Valuables sent to safe:

## 2013-06-21 NOTE — Anesthesia Pre-Procedure Evaluation (Signed)
Anesthetic History   No history of anesthetic complications           Review of Systems / Medical History  Patient summary reviewed, nursing notes reviewed and pertinent labs reviewed    Pulmonary        Sleep apnea: CPAP         Neuro/Psych         Psychiatric history     Cardiovascular    Hypertension                 GI/Hepatic/Renal     GERD             Endo/Other        Morbid obesity     Other Findings            Physical Exam    Airway  Mallampati: II  TM Distance: > 6 cm  Neck ROM: normal range of motion   Mouth opening: Normal     Cardiovascular  Regular rate and rhythm,  S1 and S2 normal,  no murmur, click, rub, or gallop             Dental  No notable dental hx       Pulmonary  Breath sounds clear to auscultation               Abdominal  GI exam deferred       Other Findings            Anesthetic Plan    ASA: 3  Anesthesia type: general          Induction: Intravenous  Anesthetic plan and risks discussed with: Patient

## 2013-06-21 NOTE — Brief Op Note (Signed)
BRIEF OPERATIVE NOTE    Date of Procedure: 06/21/2013   Preoperative Diagnosis: STENOSIS   Postoperative Diagnosis: * No post-op diagnosis entered *    Procedure(s):  T10-S1 POSTERIOR LUMBAR FUSION  Surgeon(s) and Role:     * Kem ParkinsonJed Carrell Palmatier, MD - Primary  Assistant: Kerby MoorsAnna Dunn, [facility]  Anesthesia: General   Estimated Blood Loss: see full op note  Specimens: * No specimens in log *   Findings: stenosis   Complications: none  Implants: * No implants in log *

## 2013-06-21 NOTE — Progress Notes (Signed)
Pt sleeping soundly with stable vs but when awoken states her pain is out of control.

## 2013-06-21 NOTE — Anesthesia Post-Procedure Evaluation (Signed)
Post-Anesthesia Evaluation and Assessment    Patient: Deanna Cortez MRN: [id number]  SSN: [id number]    Date of Birth: December 25, 1940  Age: 73 y.o.  Sex: female       Cardiovascular Function/Vital Signs  Visit Vitals   Item Reading   ??? BP 157/58   ??? Pulse 70   ??? Temp 36.8 ??C (98.2 ??F)   ??? Resp 13   ??? Ht 5' 5.75" (1.67 m)   ??? Wt 118.842 kg (262 lb)   ??? BMI 42.61 kg/m2   ??? SpO2 97%       Patient is status post general anesthesia for Procedure(s):  T10-S1 POSTERIOR LUMBAR FUSION.    Nausea/Vomiting: None    Postoperative hydration reviewed and adequate.    Pain:  Pain Scale 1: Numeric (0 - 10) (06/21/13 2116)  Pain Intensity 1: 6 (06/21/13 2116)   Managed    Neurological Status:   Neuro (WDL): Within Defined Limits (06/21/13 2052)   At baseline    Mental Status and Level of Consciousness: Alert and oriented     Pulmonary Status:   O2 Device: Nasal cannula (06/21/13 2052)   Adequate oxygenation and airway patent    Complications related to anesthesia: None    Post-anesthesia assessment completed. No concerns    Signed By: Tamela OddiFrank J Alezander Dimaano, MD     June 21, 2013

## 2013-06-21 NOTE — Other (Signed)
Attempted to updated patient's family, son-in-law regarding status of procedure. Message left with volunteer.

## 2013-06-22 LAB — METABOLIC PANEL, BASIC
Anion gap: 8 mmol/L (ref 5–15)
BUN/Creatinine ratio: 18 (ref 12–20)
BUN: 10 MG/DL (ref 6–20)
CO2: 26 mmol/L (ref 21–32)
Calcium: 7.9 MG/DL — ABNORMAL LOW (ref 8.5–10.1)
Chloride: 103 mmol/L (ref 97–108)
Creatinine: 0.56 MG/DL (ref 0.45–1.15)
GFR est AA: >60 mL/min/{1.73_m2} (ref >60)
GFR est non-AA: >60 mL/min/{1.73_m2} (ref >60)
Glucose: 184 mg/dL — ABNORMAL HIGH (ref 65–100)
Potassium: 3.7 mmol/L (ref 3.5–5.1)
Sodium: 137 mmol/L (ref 136–145)

## 2013-06-22 LAB — GLUCOSE, POC
Glucose (POC): 152 mg/dL — ABNORMAL HIGH (ref 65–100)
Glucose (POC): 122 mg/dL — ABNORMAL HIGH (ref 65–100)
Glucose (POC): 117 mg/dL — ABNORMAL HIGH (ref 65–100)

## 2013-06-22 LAB — HEMOGLOBIN: HGB: 10.2 g/dL — ABNORMAL LOW (ref 11.5–16.0)

## 2013-06-22 MED ORDER — ONDANSETRON (PF) 4 MG/2 ML INJECTION
4 mg/2 mL | INTRAMUSCULAR | Status: DC | PRN
Start: 2013-06-22 — End: 2013-06-21

## 2013-06-22 MED ORDER — SODIUM CHLORIDE 0.9 % IJ SYRG
INTRAMUSCULAR | Status: DC | PRN
Start: 2013-06-22 — End: 2013-06-21

## 2013-06-22 MED ORDER — MIDAZOLAM 1 MG/ML IJ SOLN
1 mg/mL | INTRAMUSCULAR | Status: DC | PRN
Start: 2013-06-22 — End: 2013-06-21
  Administered 2013-06-22: 02:00:00 via INTRAVENOUS

## 2013-06-22 MED ORDER — HYDROMORPHONE (PF) 1 MG/ML IJ SOLN
1 mg/mL | INTRAMUSCULAR | Status: DC | PRN
Start: 2013-06-22 — End: 2013-06-21

## 2013-06-22 MED ORDER — DIPHENHYDRAMINE HCL 50 MG/ML IJ SOLN
50 mg/mL | INTRAMUSCULAR | Status: DC | PRN
Start: 2013-06-22 — End: 2013-06-21

## 2013-06-22 MED ORDER — KETOROLAC TROMETHAMINE 30 MG/ML INJECTION
30 mg/mL (1 mL) | Freq: Four times a day (QID) | INTRAMUSCULAR | Status: DC
Start: 2013-06-22 — End: 2013-06-24
  Administered 2013-06-22 – 2013-06-24 (×9): via INTRAVENOUS

## 2013-06-22 MED ORDER — DEXAMETHASONE SODIUM PHOSPHATE 4 MG/ML IJ SOLN
4 mg/mL | Freq: Once | INTRAMUSCULAR | Status: DC | PRN
Start: 2013-06-22 — End: 2013-06-21

## 2013-06-22 MED ORDER — FENTANYL CITRATE (PF) 50 MCG/ML IJ SOLN
50 mcg/mL | INTRAMUSCULAR | Status: AC | PRN
Start: 2013-06-22 — End: 2013-06-21
  Administered 2013-06-22 (×4): via INTRAVENOUS

## 2013-06-22 MED ORDER — EPHEDRINE SULFATE 50 MG/ML IJ SOLN
50 mg/mL | INTRAMUSCULAR | Status: DC | PRN
Start: 2013-06-22 — End: 2013-06-21

## 2013-06-22 MED ORDER — MORPHINE 10 MG/ML INJ SOLUTION
10 mg/ml | INTRAMUSCULAR | Status: DC | PRN
Start: 2013-06-22 — End: 2013-06-21
  Administered 2013-06-22 (×5): via INTRAVENOUS

## 2013-06-22 MED ADMIN — morphine (PF) PCA 150 mg/30 ml: INTRAVENOUS | @ 12:00:00 | NDC 00409602804

## 2013-06-22 MED ADMIN — senna-docusate (PERICOLACE) 8.6-50 mg per tablet 1 Tab: ORAL | @ 21:00:00 | NDC 00039000210

## 2013-06-22 MED ADMIN — ondansetron (ZOFRAN ODT) tablet 4 mg: ORAL | @ 18:00:00 | NDC 68001024616

## 2013-06-22 MED ADMIN — oxyCODONE IR (ROXICODONE) tablet 10 mg: ORAL | @ 13:00:00 | NDC 68084035411

## 2013-06-22 MED ADMIN — acetaminophen (TYLENOL) tablet 650 mg: ORAL | @ 10:00:00 | NDC 50580050110

## 2013-06-22 MED ADMIN — vancomycin (VANCOCIN) injection: NDC 67457034000

## 2013-06-22 MED ADMIN — 0.9% sodium chloride infusion: INTRAVENOUS | @ 01:00:00 | NDC 00409798348

## 2013-06-22 MED ADMIN — diazepam (VALIUM) tablet 5 mg: ORAL | @ 15:00:00 | NDC 63739007310

## 2013-06-22 MED ADMIN — acetaminophen (TYLENOL) tablet 650 mg: ORAL | @ 15:00:00 | NDC 50580050110

## 2013-06-22 MED ADMIN — fentaNYL citrate (PF) injection: INTRAVENOUS | NDC 00409909332

## 2013-06-22 MED ADMIN — pantoprazole (PROTONIX) tablet 40 mg: ORAL | @ 11:00:00 | NDC 00904623561

## 2013-06-22 MED ADMIN — oxyCODONE IR (ROXICODONE) tablet 10 mg: ORAL | @ 15:00:00 | NDC 68084035411

## 2013-06-22 MED ADMIN — oxyCODONE IR (ROXICODONE) tablet 10 mg: ORAL | @ 18:00:00 | NDC 68084035411

## 2013-06-22 MED ADMIN — prazosin (MINIPRESS) capsule 2 mg: ORAL | @ 21:00:00 | NDC 51079063001

## 2013-06-22 MED ADMIN — senna-docusate (PERICOLACE) 8.6-50 mg per tablet 1 Tab: ORAL | @ 13:00:00 | NDC 00039000210

## 2013-06-22 MED ADMIN — glycopyrrolate (ROBINUL) injection: INTRAVENOUS | NDC 00143968201

## 2013-06-22 MED ADMIN — lactated ringers infusion: INTRAVENOUS | @ 01:00:00 | NDC 00338011704

## 2013-06-22 MED ADMIN — diazepam (VALIUM) tablet 5 mg: ORAL | @ 21:00:00 | NDC 63739007310

## 2013-06-22 MED ADMIN — amLODIPine (NORVASC) tablet 5 mg: ORAL | @ 13:00:00 | NDC 68084025911

## 2013-06-22 MED ADMIN — acetaminophen (TYLENOL) tablet 650 mg: ORAL | @ 04:00:00 | NDC 50580050110

## 2013-06-22 MED ADMIN — morphine (PF) PCA 150 mg/30 ml: INTRAVENOUS | NDC 00409602804

## 2013-06-22 MED ADMIN — oxyCODONE IR (ROXICODONE) tablet 10 mg: ORAL | @ 10:00:00 | NDC 68084035411

## 2013-06-22 MED ADMIN — chlorthalidone (HYGROTEN) tablet 25 mg: ORAL | @ 13:00:00 | NDC 51079005801

## 2013-06-22 MED ADMIN — pantoprazole (PROTONIX) tablet 40 mg: ORAL | @ 21:00:00 | NDC 00904623561

## 2013-06-22 MED ADMIN — atenolol (TENORMIN) tablet 100 mg: ORAL | @ 04:00:00 | NDC 62584046711

## 2013-06-22 MED ADMIN — morphine (PF) PCA 150 mg/30 ml: INTRAVENOUS | @ 02:00:00 | NDC 00409602804

## 2013-06-22 MED ADMIN — buPROPion SR (WELLBUTRIN SR) tablet 150 mg: ORAL | @ 13:00:00 | NDC 51079039201

## 2013-06-22 MED ADMIN — acetaminophen (TYLENOL) tablet 650 mg: ORAL | @ 21:00:00 | NDC 50580050110

## 2013-06-22 MED ADMIN — morphine (PF) 150 mg/30 mL PCA: INTRAVENOUS | @ 01:00:00 | NDC 00409602804

## 2013-06-22 MED ADMIN — benzocaine-menthol (CEPACOL) lozenge 1 Lozenge: ORAL | @ 21:00:00 | NDC 70312046129

## 2013-06-22 MED ADMIN — prazosin (MINIPRESS) capsule 2 mg: ORAL | @ 13:00:00 | NDC 51079063020

## 2013-06-22 MED ADMIN — gabapentin (NEURONTIN) capsule 300 mg: ORAL | @ 21:00:00 | NDC 68084078311

## 2013-06-22 MED ADMIN — timolol (TIMOPTIC) 0.5 % ophthalmic solution 1 Drop: OPHTHALMIC | @ 13:00:00 | NDC 61314022705

## 2013-06-22 MED ADMIN — oxyCODONE IR (ROXICODONE) tablet 10 mg: ORAL | @ 07:00:00 | NDC 68084035411

## 2013-06-22 MED ADMIN — gabapentin (NEURONTIN) capsule 300 mg: ORAL | @ 13:00:00 | NDC 68084078311

## 2013-06-22 MED ADMIN — sodium chloride (NS) flush 5-10 mL: INTRAVENOUS | @ 10:00:00 | NDC 87701099893

## 2013-06-22 MED ADMIN — neostigmine (PROSTIGMINE) injection: INTRAVENOUS | NDC 00517003325

## 2013-06-22 MED ADMIN — venlafaxine-SR (EFFEXOR-XR) capsule 150 mg: ORAL | @ 13:00:00 | NDC 68084069811

## 2013-06-22 MED ADMIN — oxyCODONE IR (ROXICODONE) tablet 10 mg: ORAL | @ 04:00:00 | NDC 68084035411

## 2013-06-22 MED ADMIN — vancomycin (VANCOCIN) 2000 mg in NS 500 ml infusion: INTRAVENOUS | @ 07:00:00 | NDC 09999945502

## 2013-06-22 MED ADMIN — sodium chloride (NS) flush 5-10 mL: INTRAVENOUS | @ 18:00:00 | NDC 87701099893

## 2013-06-22 MED FILL — HEPARIN (PORCINE) 30,000 UNIT/1,000 ML IN 0.9 % SOD CHLORIDE IV: 30000 unit/1,000 mL | INTRAVENOUS | Qty: 1000

## 2013-06-22 MED FILL — AMLODIPINE 5 MG TAB: 5 mg | ORAL | Qty: 1

## 2013-06-22 MED FILL — PRAZOSIN 1 MG CAP: 1 mg | ORAL | Qty: 2

## 2013-06-22 MED FILL — SENNA PLUS 8.6 MG-50 MG TABLET: ORAL | Qty: 1

## 2013-06-22 MED FILL — GABAPENTIN 100 MG CAP: 100 mg | ORAL | Qty: 3

## 2013-06-22 MED FILL — CEPACOL SORE THROAT (BENZOCAINE-MENTHOL) 15 MG-3.6 MG LOZENGES: Qty: 1

## 2013-06-22 MED FILL — ACETAMINOPHEN 325 MG TABLET: 325 mg | ORAL | Qty: 2

## 2013-06-22 MED FILL — BD POSIFLUSH NORMAL SALINE 0.9 % INJECTION SYRINGE: INTRAMUSCULAR | Qty: 20

## 2013-06-22 MED FILL — MIDAZOLAM 1 MG/ML IJ SOLN: 1 mg/mL | INTRAMUSCULAR | Qty: 2

## 2013-06-22 MED FILL — LATANOPROST 0.005 % EYE DROPS: 0.005 % | OPHTHALMIC | Qty: 2.5

## 2013-06-22 MED FILL — ATENOLOL 50 MG TAB: 50 mg | ORAL | Qty: 2

## 2013-06-22 MED FILL — KETAMINE 50 MG/ML IJ SOLN: 50 mg/mL | INTRAMUSCULAR | Qty: 70

## 2013-06-22 MED FILL — SODIUM CHLORIDE 0.9 % IV: INTRAVENOUS | Qty: 1000

## 2013-06-22 MED FILL — OXYCODONE 5 MG TAB: 5 mg | ORAL | Qty: 2

## 2013-06-22 MED FILL — NEOSTIGMINE METHYLSULFATE 1 MG/ML INJECTION: 1 mg/mL | INTRAMUSCULAR | Qty: 3

## 2013-06-22 MED FILL — PANTOPRAZOLE 40 MG TAB, DELAYED RELEASE: 40 mg | ORAL | Qty: 1

## 2013-06-22 MED FILL — ONDANSETRON 4 MG TAB, RAPID DISSOLVE: 4 mg | ORAL | Qty: 1

## 2013-06-22 MED FILL — KETOROLAC TROMETHAMINE 30 MG/ML INJECTION: 30 mg/mL (1 mL) | INTRAMUSCULAR | Qty: 1

## 2013-06-22 MED FILL — BUPROPION SR 150 MG TAB: 150 mg | ORAL | Qty: 1

## 2013-06-22 MED FILL — DIAZEPAM 5 MG TAB: 5 mg | ORAL | Qty: 1

## 2013-06-22 MED FILL — VENLAFAXINE SR 37.5 MG 24 HR CAP: 37.5 mg | ORAL | Qty: 4

## 2013-06-22 MED FILL — FENTANYL CITRATE (PF) 50 MCG/ML IJ SOLN: 50 mcg/mL | INTRAMUSCULAR | Qty: 2

## 2013-06-22 MED FILL — BD POSIFLUSH NORMAL SALINE 0.9 % INJECTION SYRINGE: INTRAMUSCULAR | Qty: 10

## 2013-06-22 MED FILL — CLONAZEPAM 1 MG TAB: 1 mg | ORAL | Qty: 1

## 2013-06-22 MED FILL — TIMOLOL MALEATE 0.5 % EYE DROPS: 0.5 % | OPHTHALMIC | Qty: 5

## 2013-06-22 MED FILL — DEXAMETHASONE SODIUM PHOSPHATE 4 MG/ML IJ SOLN: 4 mg/mL | INTRAMUSCULAR | Qty: 2

## 2013-06-22 MED FILL — CHLORTHALIDONE 25 MG TAB: 25 mg | ORAL | Qty: 1

## 2013-06-22 MED FILL — QUELICIN 20 MG/ML INJECTION SOLUTION: 20 mg/mL | INTRAMUSCULAR | Qty: 8

## 2013-06-22 MED FILL — DESIPRAMINE 25 MG TAB: 25 mg | ORAL | Qty: 2

## 2013-06-22 MED FILL — VANCOMYCIN IN 0.9 % SODIUM CHLORIDE 2 GRAM/500 ML IV: 2 gram/500 mL | INTRAVENOUS | Qty: 500

## 2013-06-22 MED FILL — NEOSTIGMINE METHYLSULFATE 1 MG/ML INJECTION: 1 mg/mL | INTRAMUSCULAR | Qty: 10

## 2013-06-22 MED FILL — XYLOCAINE-MPF 20 MG/ML (2 %) INJECTION SOLUTION: 20 mg/mL (2 %) | INTRAMUSCULAR | Qty: 100

## 2013-06-22 MED FILL — MORPHINE 10 MG/ML SYRINGE: 10 mg/mL | INTRAMUSCULAR | Qty: 1

## 2013-06-22 MED FILL — MORPHINE (PF) 150 MG/30 ML CONCENTRATED INFUSION: 150 mg/30 mL | INTRAVENOUS | Qty: 30

## 2013-06-22 MED FILL — DIPRIVAN 10 MG/ML INTRAVENOUS EMULSION: 10 mg/mL | INTRAVENOUS | Qty: 2207.72

## 2013-06-22 MED FILL — TRANEXAMIC ACID 1,000 MG/10 ML (100 MG/ML) IV: 1000 mg/10 mL (100 mg/mL) | INTRAVENOUS | Qty: 10

## 2013-06-22 MED FILL — LACTATED RINGERS IV: INTRAVENOUS | Qty: 1000

## 2013-06-22 MED FILL — GLYCOPYRROLATE 0.2 MG/ML IJ SOLN: 0.2 mg/mL | INTRAMUSCULAR | Qty: 0.5

## 2013-06-22 MED FILL — ROCURONIUM 10 MG/ML IV: 10 mg/mL | INTRAVENOUS | Qty: 5

## 2013-06-22 MED FILL — GLYCOPYRROLATE 0.2 MG/ML IJ SOLN: 0.2 mg/mL | INTRAMUSCULAR | Qty: 2

## 2013-06-22 MED FILL — ONDANSETRON (PF) 4 MG/2 ML INJECTION: 4 mg/2 mL | INTRAMUSCULAR | Qty: 2

## 2013-06-22 NOTE — Progress Notes (Signed)
Bedside and Verbal shift change report given to megan, rn (Cabin crewoncoming nurse) by Prentiss Bellskatelyn, rn (offgoing nurse). Report included the following information SBAR.

## 2013-06-22 NOTE — Progress Notes (Signed)
Chart reviewed for medical necessity.  Patient admitted for scheduled surgery.  S/P T10-S1 POSTERIOR LUMBAR FUSION . Discharge planning deferred to case manager.

## 2013-06-22 NOTE — Progress Notes (Signed)
Orthopedic Spine Progress Note  Post Op day: 1 Day Post-Op    June 22, 2013 8:47 AM   Admit Date: 06/21/2013  Procedure: Procedure(s):  T10-S1 POSTERIOR LUMBAR FUSION    Subjective:     Deanna Cortez has complaints incisional pain in lumbar spine..   Tolerating diet.  No N/V.    Pain Control:   Pain Assessment  Pain Scale 1: Numeric (0 - 10)  Pain Intensity 1: 8  Pain Onset 1: Post-Op  Pain Location 1: Back  Pain Orientation 1: Lower  Pain Description 1: Aching  Pain Intervention(s) 1: Medication (see MAR)    Objective:          Physical Exam:  General:  Alert and oriented. No acute distress.   Heart:  Respirations unlabored.   Abdomen:   Extremities: Soft, non-tender.  No evidence of cyanosis. Pulses palpable in both upper and lower extremities.       Neurologic:  Musculoskeletal:  No new motor deficits. Neurovascular exam within normal limits.  Sensation stable.  Motor: unchanged C5-T1 and L2-S1.   Homan's sign negative in bilateral lower extremities.  Calves soft, nontender upon palpation and with passive twitch.  Moves both upper and lower extremities.   Incision: clean, dry, and intact.  No significant erythema or swelling.  No active drainage noted.        Vital Signs:    Blood pressure 163/53, pulse 78, temperature 98.2 ??F (36.8 ??C), resp. rate 15, height 5' 5.75" (1.67 m), weight 118.842 kg (262 lb), SpO2 98 %.  Temp (24hrs), Avg:97.8 ??F (36.6 ??C), Min:97.5 ??F (36.4 ??C), Max:98.7 ??F (37.1 ??C)      LAB:    Recent Labs      06/22/13   0250   HGB  10.2*     Lab Results   Component Value Date/Time    Sodium 137 06/22/2013  2:50 AM    Potassium 3.7 06/22/2013  2:50 AM    Chloride 103 06/22/2013  2:50 AM    CO2 26 06/22/2013  2:50 AM    Glucose 184 06/22/2013  2:50 AM    BUN 10 06/22/2013  2:50 AM    Creatinine 0.56 06/22/2013  2:50 AM    Calcium 7.9 06/22/2013  2:50 AM       Intake/Output:04/15 0700 - 04/15 1859  In: -   Out: 410 [Urine:300; Drains:110]  04/13 1900 - 04/15 0659  In: 4540 [P.O.:100; I.V.:4300]  Out: 2375  [Urine:1415; Drains:410]    PT/OT:   Gait:                    Assessment:   Patient is 1 Day Post-Op s/p Procedure(s):  T10-S1 POSTERIOR LUMBAR FUSION    Plan:     1.  Continue PT/OT.  TLSO at bedside  2.  Continue PCA for today, will add toradol and muscle relaxor.  3.  VTE Prophylaxes - TEDS &/or SCDs   4.   Will likely need inpatient rehab.  5.  6.       Signed By: Barbee Shropshireraci A Hillery Zachman, NP

## 2013-06-22 NOTE — Progress Notes (Signed)
Problem: Mobility Impaired (Adult and Pediatric)  Goal: *Acute Goals and Plan of Care (Insert Text)  Physical Therapy Goals  Initiated 06/22/2013    1. Patient will move from supine to sit and sit to supine in bed with modified independence within 4 days.  2. Patient will perform sit to stand with modified independence within 4 days.  3. Patient will ambulate with supervision/set-up for 100 feet with the least restrictive device within 4 days.  4. Patient will ascend/descend 2 stairs with 1 handrail(s) with minimal assistance/contact guard assist within 4 days.  5. Patient will verbalize and demonstrate understanding of spinal precautions (No bending, lifting greater than 5 lbs, or twisting; log-roll technique; frequent repositioning as instructed) within 4 days.  PHYSICAL THERAPY TREATMENT  Patient: Deanna Cortez ([age] y.o. female)  Date: 06/22/2013  Diagnosis: STENOSIS   Spinal stenosis, lumbar region, without neurogenic claudication  Spinal stenosis, lumbar region, without neurogenic claudication <principal problem not specified>  Procedure(s) (LRB):  T10-S1 POSTERIOR LUMBAR FUSION (N/A) 1 Day Post-Op  Precautions: Back (TLSO OOB)      ASSESSMENT:  Able to transfer OOB and ambulate a short distance to the chair and tolerated very well. Overall required minA-CGA for mobility but did need additional time and specific cues for each task.She was able to recall 3/3 back precautions but requires a few cues to adhere. Will continue to progress mobility as she can tolerate. If she continues to progress may be able to return home, but as this time will continue to recommend IP rehab.  Progression toward goals:  [X]       Improving appropriately and progressing toward goals  [ ]       Improving slowly and progressing toward goals  [ ]       Not making progress toward goals and plan of care will be adjusted       PLAN:  Patient continues to benefit from skilled intervention to address the above impairments.  Continue treatment per  established plan of care.  Discharge Recommendations:  Inpatient Rehab  Further Equipment Recommendations for Discharge:  none       SUBJECTIVE:   Patient stated ???It was easier.???   The patient stated 3/3 back precautions. Reviewed all 3 with patient.      OBJECTIVE DATA SUMMARY:   Critical Behavior:  Neurologic State: Alert, Appropriate for age  Orientation Level: Oriented X4  Cognition: Appropriate decision making, Appropriate for age attention/concentration, Appropriate safety awareness, Follows commands  Safety/Judgement: Awareness of environment, Fall prevention, Good awareness of safety precautions, Home safety, Insight into deficits  Functional Mobility Training:  Bed Mobility:  Log Rolling: Minimal  assistance;Assist X1  Supine to Sit: CGA  Sit to Supine: Minimum assistance;Assist X2  Scooting: Supervision        Brace donned with  maximal assistance   Transfers:  Sit to Stand: CGA;Verbal cues  Stand to Sit: CGA;Additional time;Verbal cues                             Balance:  Sitting: Intact  Standing: Intact;With support  Standing - Static: Good  Standing - Dynamic : Fair  Ambulation/Gait Training:  Distance (ft): 5 Feet (ft) (bed to chair)  Assistive Device: Gait belt;Walker, rolling;Brace/Splint  Ambulation - Level of Assistance: CGA        Gait Abnormalities: Antalgic;Decreased step clearance        Base of Support: Widened     Speed/Cadence: Slow  Step  Length: Right shortened;Left shortened    Pain:  Pain Scale 1: Numeric (0 - 10)  Pain Intensity 1: 8  Pain Location 1: Back  Pain Orientation 1: Lower  Pain Description 1: Aching  Pain Intervention(s) 1: Medication (see MAR)  Activity Tolerance:   Good. BP stable  Please refer to the flowsheet for vital signs taken during this treatment.  After treatment:   [X]   Patient left in no apparent distress sitting up in chair  [ ]   Patient left in no apparent distress in bed  [X]   Call bell left within reach  [X]   Nursing notified  [X]   Caregiver present  [ ]    Bed alarm activated      COMMUNICATION/COLLABORATION:   The patient???s plan of care was discussed with: Occupational Therapist and Registered Nurse    Luna GlasgowWyna M Phonseya, PT   Time Calculation: 20 mins

## 2013-06-22 NOTE — Op Note (Signed)
Name:      Deanna Cortez, Deanna Cortez                                          Surgeon:        Tomie ChinaJed S Anslee Micheletti,   MD  Account #: [id number]                 Surgery Date:   06/21/2013  DOB:       12/28/1940  Age:       [age]                           Location:                                 OPERATIVE REPORT      PREOPERATIVE DIAGNOSES  1. Status post L2 to L5 lumbar fusion.  2. L2 compression fracture.  3. Stenosis, L2-L3 and L3-L4.    POSTOPERATIVE DIAGNOSES  1. Status post L2 to L5 lumbar fusion.  2. L2 compression fracture.  3. Stenosis, L2-L3 and L3-L4.    PROCEDURES PERFORMED  1. Exploration of fusion with removal of hardware.  2. Revision laminectomy, L2-L3 and L3-L4.  3. Posterior thoracolumbar fusion, T10 to L5.  4. Posterior segmentation, T10 to L5.    SURGEON: Hadassa Cermak S. Byrd Terrero, MD    ASSISTANT: Kerby MoorsAnna Dunn, PA-C    ESTIMATED BLOOD LOSS: 300 mL, 140 mL Cell Saver given back to the patient.    COMPLICATIONS: None.    DRAINS: One medium Hemovac.    SPECIMENS REMOVED: None.    INDICATIONS FOR PROCEDURE: This is a 73 year old female who has had a  previous L3 to L5 fusion, underwent a lateral interbody fusion at L2-L3,  was initially did well, but has had a compression fracture of L2 with  collapse and subsidence of the L2-L3 implant with recurrent stenosis  centrally as well as foraminally at L2-L3. The patient for exploration of  fusion and extension and decompression.    DESCRIPTION OF PROCEDURE: The patient was identified as the correct  patient, brought to the operative suite and underwent general anesthesia  without difficulty. Foley catheter placed. Preoperative neuromonitoring was  placed, baselines obtained, and these remained stable throughout the  surgical procedure. The back was prepped and draped sterilely. Time-out  performed.    Incisions were made with the extension of her previous surgical incision  posteriorly, superiorly and inferiorly, exposing the previous fusion, with  good fusion noted from L3  to L5. The transverse processes of L2 and L1 were  identified as well as the  T10, T11 and T12 joints were identified.  Dissection was carried distally to the L5-S1 facet joints, which were also  exposed bilaterally. At that time, fluoroscopy was brought in, and using  biplanar fluoroscopy, the pedicle screw instrumentation was placed from T10  all the way down to S1. These were all tested with neuromonitoring, with no  breaches below 20 milliamps.      At that time, a revision laminectomy was started, with removal of the  remainder of the L2 spinous process. Severe lateral recess and foraminal  stenosis due to facet hypertrophy as well as ligamentum hypertrophy at  L3-L4 and L2-L3, remedied with partial medial facet resections bilaterally  as well  as undercutting superior articular processes for bilateral  decompression. Good pedicle-to-pedicle decompression from L2 down to L4 was  obtained.    The wounds were thoroughly irrigated. Gelfoam for hemostasis. The facet  joints were all decorticated. Longitudinal rods were bent in appropriate  lordosis, attached to the pedicle screws with set screws, and final  tightening performed. One cross connector placed. Final x-rays demonstrated  stable fixation.    The autograft/allograft mixture was then placed into the posterior elements  and posterior gutters and L5-S1. Medium Hemovac placed. Interrupted 0  Vicryl in the fascial layer, 2-0 Vicryl in the subcutaneous layer, and 3-0  Vicryl in the skin.          Aadi Bordner S Jesper Stirewalt, MD    cc:   Reily Treloar S Taisha Pennebaker, MD        JSV/wmx; D: 06/22/2013 09:00 A; T: 06/22/2013 10:40 A; Doc# [id number]; Job#  [id number]

## 2013-06-22 NOTE — Progress Notes (Deleted)
Cm informed that authorization has been completed for patient to go to [facility] today.  Cm spoke with patient and family in the room to advise of this, no preference given for ambulance.  Referral sent via ecin to [facility] for 2:30 pm pick up, waiting for response.  MD made aware of authorization.  YUM! BrandsCaitlin Helton BSW, [place]

## 2013-06-22 NOTE — Progress Notes (Signed)
Problem: Mobility Impaired (Adult and Pediatric)  Goal: *Acute Goals and Plan of Care (Insert Text)  Physical Therapy Goals  Initiated 06/22/2013    1. Patient will move from supine to sit and sit to supine in bed with modified independence within 4 days.  2. Patient will perform sit to stand with modified independence within 4 days.  3. Patient will ambulate with supervision/set-up for 100 feet with the least restrictive device within 4 days.  4. Patient will ascend/descend 2 stairs with 1 handrail(s) with minimal assistance/contact guard assist within 4 days.  5. Patient will verbalize and demonstrate understanding of spinal precautions (No bending, lifting greater than 5 lbs, or twisting; log-roll technique; frequent repositioning as instructed) within 4 days.  PHYSICAL THERAPY EVALUATION  Patient: Deanna Cortez ([age] y.o. female)  Date: 06/22/2013  Primary Diagnosis: STENOSIS   Spinal stenosis, lumbar region, without neurogenic claudication  Spinal stenosis, lumbar region, without neurogenic claudication  Procedure(s) (LRB):  T10-S1 POSTERIOR LUMBAR FUSION (N/A) 1 Day Post-Op   Precautions:   Back (TLSO OOB)      ASSESSMENT :  Based on the objective data described below, the patient presents with generalized weakness, decreased activity tolerance due to antalgia and overall functional mobility s/p lumbar surgery. Patient tolerated initial OOB well. Overall required minA-CGA x2 for sitting up, standing and a few side steps with the RW. Reviewed back precautions, log roll technique, and brace wear which she is familiar with from pervious spinal surgeries. Will progress to OOB to chair this pm as tolerated. Recommend IP rehab at discharge to maximize rehab potential as she previously lives alone and would have to stay with her daughter who lives 4 hours away.    Patient will benefit from skilled intervention to address the above impairments.  Patient???s rehabilitation potential is considered to be Good  Factors which may  influence rehabilitation potential include:   [ ]          None noted  [ ]          Mental ability/status  [X]          Medical condition  [ ]          Home/family situation and support systems  [ ]          Safety awareness  [ ]          Pain tolerance/management  [ ]          Other:        PLAN :  Recommendations and Planned Interventions:  [X]            Bed Mobility Training             [ ]     Neuromuscular Re-Education  [X]            Transfer Training                   [ ]     Orthotic/Prosthetic Training  [X]            Gait Training                         [ ]     Modalities  [X]            Therapeutic Exercises           [ ]     Edema Management/Control  [X]            Therapeutic Activities            [  X]    Patient and Family Training/Education  [ ]            Other (comment):    Frequency/Duration: Patient will be followed by physical therapy twice daily to address goals.  Discharge Recommendations: Inpatient Rehab  Further Equipment Recommendations for Discharge: none at this time       SUBJECTIVE:   Patient stated ???I've been wearing it for the last 6 weeks.???      OBJECTIVE DATA SUMMARY:       Past Medical History   Diagnosis Date   ??? Hypertension     ??? GERD (gastroesophageal reflux disease)     ??? Chronic pain         BACK AND LEGS   ??? Morbid obesity (HCC)     ??? Arthritis     ??? Cancer (HCC)         SQUAMOUS CELL CARCINOMA X 2   ??? Breast CA (HCC) 04/2011       left breast cancer   ??? Beta blocker therapy     ??? Depression     ??? OSA on CPAP       Past Surgical History   Procedure Laterality Date   ??? Hx gyn   1977       TUBAL LIGATION   ??? Hx cholecystectomy   2004   ??? Pr breast surgery procedure unlisted   2013       BREAST BIOPSY   ??? Hx colonoscopy   LAST ONE 2011   ??? Hx other surgical   1992/1994       LEEP PROCEDURE    ??? Hx other surgical   2009       ERCP   ??? Hx other surgical   2007/2009       SQUAMOUS CELL CARCINOMA REMOVED X 2   ??? Hx gi           colonoscopy   ??? Hx orthopaedic   1994       CARPAL TUNNEL RT WRIST    ???  Hx orthopaedic   2010       DOUBLE LUMBAR FUSION   ??? Hx orthopaedic   04/2013       laminectomy     Prior Level of Function/Home Situation: Lived alone prior to last surgery but has been at dtr's home since then. She lives in [place]. Was ambulating with a RW or SPC mod I   Home Situation  Home Environment: Private residence  # Steps to Enter: 2  One/Two Story Residence: One story  Living Alone: Yes  Support Systems: Family member(s), Child(ren)  Patient Expects to be Discharged to:: Rehabilitation facility  Current DME Used/Available at Home: Environmental consultant, rolling, Medical laboratory scientific officer, straight  Critical Behavior:  Neurologic State: Alert  Orientation Level: Oriented X4  Cognition: Follows commands, Appropriate for age attention/concentration, Appropriate safety awareness     Skin:  Incision C/D/I  Strength:    Strength: Generally decreased, functional                    Tone & Sensation:   Tone: Normal              Sensation: Intact               Range Of Motion:  AROM: Generally decreased, functional                       Coordination:  Coordination: Within functional  limits    Functional Mobility:  Bed Mobility:  Rolling: Minimal  assistance;Assist X2;Additional time;Verbal cues  Supine to Sit: Minimal assistance;Assist X2;Additional time;CGA;Verbal cues  Sit to Supine: Minimum assistance;Moderate assistance;Assist X2;Additional time;Verbal cues  Scooting: Supervision;Verbal cues  Transfers:  Sit to Stand: CGA;Assist X2;Additional time;Verbal cues  Stand to Sit: CGA                       Balance:   Sitting: Intact  Standing: Impaired  Standing - Static: Good  Standing - Dynamic : Fair  Ambulation/Gait Training:  Distance (ft): 2 Feet (ft) (side stepping)  Assistive Device: Gait belt;Walker, rolling  Ambulation - Level of Assistance: CGA        Gait Abnormalities: Antalgic;Decreased step clearance        Base of Support: Widened     Speed/Cadence: Slow  Step Length: Right shortened;Left shortened          Therapeutic Exercises:   No  bending, no lifting greater than 5 lbs, no twisting, log-roll technique, repositioning every 20-30 min except when sleeping, brace when OOB  Pain:  Pain Scale 1: Numeric (0 - 10)  Pain Intensity 1: 8  Pain Location 1: Back  Pain Orientation 1: Lower  Pain Description 1: Aching  Pain Intervention(s) 1: Medication (see MAR)  Activity Tolerance:   Good. Encouraged PCA, BP stable  Please refer to the flowsheet for vital signs taken during this treatment.  After treatment:   [ ]          Patient left in no apparent distress sitting up in chair  [X]          Patient left in no apparent distress in bed  [X]          Call bell left within reach  [X]          Nursing notified  [ ]          Caregiver present  [ ]          Bed alarm activated      COMMUNICATION/EDUCATION:   The patient???s plan of care was discussed with: Occupational Therapist and Registered Nurse.  [X]          Fall prevention education was provided and the patient/caregiver indicated understanding.  [X]          Patient/family have participated as able in goal setting and plan of care.  [X]          Patient/family agree to work toward stated goals and plan of care.  [ ]          Patient understands intent and goals of therapy, but is neutral about his/her participation.  [ ]          Patient is unable to participate in goal setting and plan of care.    Thank you for this referral.  Luna GlasgowWyna M Phonseya, PT   Time Calculation: 25 mins

## 2013-06-22 NOTE — Other (Signed)
Bedside and Verbal shift change report given to Florentina AddisonKatie, Charity fundraiserN (oncoming nurse) by Aundra MilletMegan, RN (offgoing nurse). Report included the following information SBAR, Kardex, Intake/Output, MAR, Recent Results, Med Rec Status and Cardiac Rhythm NSR.

## 2013-06-22 NOTE — Progress Notes (Signed)
Problem: Self Care Deficits Care Plan (Adult)  Goal: *Acute Goals and Plan of Care (Insert Text)  Occupational Therapy Goals  Initiated 06/22/2013    1. Patient will perform lower body dressing with supervision/set-up using AE PRN within 7 days.  2. Patient will perform bathing with minimal assistance/contact guard assist using most appropriate DME within 7 days.   3. Patient will grooming standing at sink for 3 minutes at supervision/set-up within 7 days.   4. Patient will don/doff back brace at minimal assistance/contact guard assist within 7 days.  5. Patient will verbalize/demonstrate 3/3 back precautions during ADL tasks without cues within 7 days.   OCCUPATIONAL THERAPY EVALUATION INCLUDING A FUNCTIONAL MEASURE  Patient: Deanna Cortez (73 y.o. female)  Date: 06/22/2013  Primary Diagnosis: STENOSIS   Spinal stenosis, lumbar region, without neurogenic claudication  Spinal stenosis, lumbar region, without neurogenic claudication  Procedure(s) (LRB):  T10-S1 POSTERIOR LUMBAR FUSION (N/A) 1 Day Post-Op   Precautions:   Back (TLSO OOB)      ASSESSMENT :  Based on the objective data described below, the patient presents with generalized decreased functional strength, activity tolerance, back precautions, pain along incisional site, s/p T10-S1 posterior fusion, POD 1, limiting functional independence and safety with BADL routine. Pt is overall modA UB ADLs, mod-maxA LB ADLs, primarily limited d/t pain and functional activity tolerance. Pt has all necessary AE from prior back surgeries. Pt able to recall back precautions with min verbal cues. Pt overall performed functional supine<>sit with minAx2, and sit<>stand with CGAx2. Pt would benefit from inpatient rehab at discharge to maximize functional independence and safety with self-care, as she would have to travel 4+hours to stay with daughter if discharged home.    Patient will benefit from skilled intervention to address the above impairments.  Patient???s rehabilitation  potential is considered to be Good  Factors which may influence rehabilitation potential include:   [X]             None noted  [ ]             Mental ability/status  [ ]             Medical condition  [ ]             Home/family situation and support systems  [ ]             Safety awareness  [ ]             Pain tolerance/management  [ ]             Other:        PLAN :  Recommendations and Planned Interventions:  [X]               Self Care Training                  [X]        Therapeutic Activities  [X]               Functional Mobility Training    [ ]        Cognitive Retraining  [X]               Therapeutic Exercises           [X]        Endurance Activities  [X]               Balance Training                   [ ]  Neuromuscular Re-Education  [ ]               Visual/Perceptual Training     [X]   Home Safety Training  [X]               Patient Education                 [X]        Family Training/Education  [ ]               Other (comment):    Frequency/Duration: Patient will be followed by occupational therapy 5 times a week to address goals.  Discharge Recommendations: Rehab and Inpatient Rehab  Further Equipment Recommendations for Discharge: TBD       SUBJECTIVE:   Patient stated ???My legs go numb if I lie on them for too long without moving.???      OBJECTIVE DATA SUMMARY:       Past Medical History   Diagnosis Date   ??? Hypertension     ??? GERD (gastroesophageal reflux disease)     ??? Chronic pain         BACK AND LEGS   ??? Morbid obesity ([place])     ??? Arthritis     ??? Cancer ([place])         SQUAMOUS CELL CARCINOMA X 2   ??? Breast CA ([place]) 04/2011       left breast cancer   ??? Beta blocker therapy     ??? Depression     ??? OSA on CPAP       Past Surgical History   Procedure Laterality Date   ??? Hx gyn   1977       TUBAL LIGATION   ??? Hx cholecystectomy   2004   ??? Pr breast surgery procedure unlisted   2013       BREAST BIOPSY   ??? Hx colonoscopy   LAST ONE 2011   ??? Hx other surgical   1992/1994       LEEP PROCEDURE     ??? Hx other surgical   2009       ERCP   ??? Hx other surgical   2007/2009       SQUAMOUS CELL CARCINOMA REMOVED X 2   ??? Hx gi           colonoscopy   ??? Hx orthopaedic   1994       CARPAL TUNNEL RT WRIST    ??? Hx orthopaedic   2010       DOUBLE LUMBAR FUSION   ??? Hx orthopaedic   04/2013       laminectomy     Prior Level of Function/Home Situation: Pt reports she has been staying with her daughter (in [place]) after back surgery 6 weeks ago; pt reports daughter as been assisting with TLSO brace and bathing. Pt has "hip kit" AE items from previous surgery. Progressively became weaker d/t back pain. Husband passed away in 02/22/23.  Home Situation  Home Environment: Private residence  # Steps to Enter: 2  One/Two Story Residence: One story  Living Alone: Yes  Support Systems: Family member(s), Child(ren)  Patient Expects to be Discharged to:: Private residence  Current DME Used/Available at Home: Environmental consultant, [place], Civil engineer, contracting, Adaptive bathing aides, Adaptive dressing aides, Grab bars  Tub or Shower Type: Shower  [X]  Right hand dominant   [ ]  Left hand dominant  Cognitive/Behavioral Status:  Neurologic State: Alert;Appropriate for age  Orientation Level: Oriented X4  Cognition: Appropriate decision making;Appropriate for age attention/concentration;Appropriate safety awareness;Follows commands  Perception: Appears intact  Perseveration: No perseveration noted  Safety/Judgement: Awareness of environment;Fall prevention;Good awareness of safety precautions;Home safety;Insight into deficits  Skin: appears intact  Edema: appears intact  Vision/Perceptual:    Tracking: Able to track stimulus in all quadrants w/o difficulty                           Corrective Lenses: Glasses  Coordination:  Coordination: Within functional limits          Balance:  Sitting: Intact  Standing: Impaired  Standing - Static: Good  Standing - Dynamic : Fair  Strength:    Strength: Generally decreased, functional              Tone & Sensation:     Tone: Normal  Sensation: Intact (pt states when not changing positions, LEs go numb)                    Range of Motion:    AROM: Generally decreased, functional                       Functional Mobility and Transfers for ADLs:  Bed Mobility:  Rolling: Minimal  assistance  Supine to Sit: Minimal assistance;Assist X2  Sit to Supine: Minimum assistance;Assist X2  Scooting: Supervision  Transfers:  Sit to Stand: CGA;Assist X2;Additional time;Verbal cues                                                                                      ADL Assessment:  Feeding: Independent    Oral Facial Hygiene/Grooming: Supervision/set up    Bathing: Moderate assistance;Additional time;Adaptive equipment    Upper Body Dressing: Moderate assistance;Additional time;Adaptive equipment    Lower Body Dressing: Moderate assistance;Additional time;Adaptive equipment    Toileting: Moderate assistance;Additional time;Adaptive equipment              ADL Intervention:                                     Cognitive Retraining  Safety/Judgement: Awareness of environment;Fall prevention;Good awareness of safety precautions;Home safety;Insight into deficits    Functional Measure:  Barthel Index:      Bathing: 0  Bladder: 0  Bowels: 10  Grooming: 5  Dressing: 5  Feeding: 10  Grooming: 5  Mobility: 5  Stairs: 0  Toilet Use: 5  Transfer (Bed to Chair and Back): 10  Total: 50        Barthel and G-code impairment scale:  Percentage of impairment CH  0% CI  1-19% CJ  20-39% CK  40-59% CL  60-79% CM  80-99% CN  100%   Barthel Score 0-100 100 99-80 79-60 59-40 20-39 1-19    0   Barthel Score 0-20 20 17-19 13-16 9-12 5-8 1-4 0      The Barthel ADL Index: Guidelines  1. The index should be used as  a record of what a patient does, not as a record of what a patient could do.  2. The main aim is to establish degree of independence from any help, physical or verbal, however minor and for whatever reason.  3. The need for supervision renders the patient not  independent.  4. A patient's performance should be established using the best available evidence. Asking the patient, friends/relatives and nurses are the usual sources, but direct observation and common sense are also important. However direct testing is not needed.  5. Usually the patient's performance over the preceding 24-48 hours is important, but occasionally longer periods will be relevant.  6. Middle categories imply that the patient supplies over 50 per cent of the effort.  7. Use of aids to be independent is allowed.    Daneen Schick., Barthel, D.W. [phone]). Functional evaluation: the Barthel Index. [place] (14)2.  Lucianne Lei der [place], J.J.M.F, [place], Diona Browner., Oris Drone., [place], [place] (1999). Measuring the change indisability after inpatient rehabilitation; comparison of the responsiveness of the Barthel Index and Functional Independence Measure. Journal of Neurology, Neurosurgery, and Psychiatry, 66(4), 3167259495.  Wilford Sports, N.J.A, [place],  W.J.M, & [place], M.A. (2004.) Assessment of post-stroke quality of life in cost-effectiveness studies: The usefulness of the Barthel Index and the EuroQoL-5D. Quality of Life Research, 13, 427-43       Pain:  Pain Scale 1: Numeric (0 - 10)  Pain Intensity 1: 8  Pain Location 1: Back  Pain Orientation 1: Lower  Pain Description 1: Aching  Pain Intervention(s) 1: Medication (see MAR)  Activity Tolerance:   VSS throughout session. Pain limiting factor.    Please refer to the flowsheet for vital signs taken during this treatment.  After treatment:   [ ] Patient left in no apparent distress sitting up in chair  [X] Patient left in no apparent distress in bed  [X] Call bell left within reach  [X] Nursing notified  [ ] Caregiver present  [ ] Bed alarm activated      COMMUNICATION/EDUCATION:   The patient???s plan of care was discussed with: Physical Therapist and Registered Nurse.  [X] Home safety education was provided and the patient/caregiver indicated  understanding.  [X] Patient/family have participated as able in goal setting and plan of care.  [X] Patient/family agree to work toward stated goals and plan of care.  [ ] Patient understands intent and goals of therapy, but is neutral about his/her participation.  [ ] Patient is unable to participate in goal setting and plan of care.  This patient???s plan of care is appropriate for delegation to OTA.    Thank you for this referral.  Vilma Prader, OT  Time Calculation: 25 mins

## 2013-06-22 NOTE — Progress Notes (Signed)
Care Management Interventions  PCP Visit Scheduled by CM: No  Palliative Care Consult: No  Mode of Transport: Other (see comment) (family)  Care Management Consult: Yes  MyChart Signup: No  Discharge Durable Medical Equipment: No  Physical Therapy Consult: Yes  Occupational Therapy Consult: Yes  Speech Therapy Consult: No  Adequate Support Network: Relative's Home  Confirm Follow Up Transport: Other (see comment) (ambulance)  Plan discussed with patient?: Yes  Pt Discharge Location  Living Situation: Rehab hospital/unit acute    Cm reviewed chart for transitions of care and met with patient, her daughter, and son-in-law in the room. Cm explained role and offered support.  Patient is alert and oriented x4.      Past medical history: hypertension, sleep apnea (uses a CPAP at home), arthritis, GERD.  Patient was a scheduled admission for a lumbar laminectomy.  Patient states that she lives about 2 hours away but has already made plans to stay with her daughter and son-in-law after rehab.  Patient has a rehab consult, cm discussed options with them and offered freedom of choice.  Patient chose [facility] location.  Cm sent a referral to them via ecin and placed freedom of choice form on bedside chart.  Will continue to follow.  [employer], [place]

## 2013-06-22 NOTE — Op Note (Signed)
Name:      Deanna Cortez, Deanna Cortez                                          Surgeon:        Tomie ChinaJed S Anslee Micheletti,   MD  Account #: [id number]                 Surgery Date:   06/21/2013  DOB:       12/28/1940  Age:       [age]                           Location:                                 OPERATIVE REPORT      PREOPERATIVE DIAGNOSES  1. Status post L2 to L5 lumbar fusion.  2. L2 compression fracture.  3. Stenosis, L2-L3 and L3-L4.    POSTOPERATIVE DIAGNOSES  1. Status post L2 to L5 lumbar fusion.  2. L2 compression fracture.  3. Stenosis, L2-L3 and L3-L4.    PROCEDURES PERFORMED  1. Exploration of fusion with removal of hardware.  2. Revision laminectomy, L2-L3 and L3-L4.  3. Posterior thoracolumbar fusion, T10 to L5.  4. Posterior segmentation, T10 to L5.    SURGEON: Hadassa Cermak S. Byrd Terrero, MD    ASSISTANT: Kerby MoorsAnna Dunn, PA-C    ESTIMATED BLOOD LOSS: 300 mL, 140 mL Cell Saver given back to the patient.    COMPLICATIONS: None.    DRAINS: One medium Hemovac.    SPECIMENS REMOVED: None.    INDICATIONS FOR PROCEDURE: This is a 73 year old female who has had a  previous L3 to L5 fusion, underwent a lateral interbody fusion at L2-L3,  was initially did well, but has had a compression fracture of L2 with  collapse and subsidence of the L2-L3 implant with recurrent stenosis  centrally as well as foraminally at L2-L3. The patient for exploration of  fusion and extension and decompression.    DESCRIPTION OF PROCEDURE: The patient was identified as the correct  patient, brought to the operative suite and underwent general anesthesia  without difficulty. Foley catheter placed. Preoperative neuromonitoring was  placed, baselines obtained, and these remained stable throughout the  surgical procedure. The back was prepped and draped sterilely. Time-out  performed.    Incisions were made with the extension of her previous surgical incision  posteriorly, superiorly and inferiorly, exposing the previous fusion, with  good fusion noted from L3  to L5. The transverse processes of L2 and L1 were  identified as well as the  T10, T11 and T12 joints were identified.  Dissection was carried distally to the L5-S1 facet joints, which were also  exposed bilaterally. At that time, fluoroscopy was brought in, and using  biplanar fluoroscopy, the pedicle screw instrumentation was placed from T10  all the way down to S1. These were all tested with neuromonitoring, with no  breaches below 20 milliamps.      At that time, a revision laminectomy was started, with removal of the  remainder of the L2 spinous process. Severe lateral recess and foraminal  stenosis due to facet hypertrophy as well as ligamentum hypertrophy at  L3-L4 and L2-L3, remedied with partial medial facet resections bilaterally  as well  as undercutting superior articular processes for bilateral  decompression. Good pedicle-to-pedicle decompression from L2 down to L4 was  obtained.    The wounds were thoroughly irrigated. Gelfoam for hemostasis. The facet  joints were all decorticated. Longitudinal rods were bent in appropriate  lordosis, attached to the pedicle screws with set screws, and final  tightening performed. One cross connector placed. Final x-rays demonstrated  stable fixation.    The autograft/allograft mixture was then placed into the posterior elements  and posterior gutters and L5-S1. Medium Hemovac placed. Interrupted 0  Vicryl in the fascial layer, 2-0 Vicryl in the subcutaneous layer, and 3-0  Vicryl in the skin.          Tomie ChinaJed S Panzy Bubeck, MD    cc:   Tomie ChinaJed S Thomos Domine, MD        JSV/wmx; D: 06/22/2013 09:00 A; T: 06/22/2013 10:40 A; Doc# [id number]; Job#  [id number]

## 2013-06-23 LAB — GLUCOSE, POC
Glucose (POC): 130 mg/dL — ABNORMAL HIGH (ref 65–100)
Glucose (POC): 123 mg/dL — ABNORMAL HIGH (ref 65–100)
Glucose (POC): 113 mg/dL — ABNORMAL HIGH (ref 65–100)
Glucose (POC): 154 mg/dL — ABNORMAL HIGH (ref 65–100)

## 2013-06-23 LAB — HEMOGLOBIN: HGB: 8.4 g/dL — ABNORMAL LOW (ref 11.5–16.0)

## 2013-06-23 MED ADMIN — gabapentin (NEURONTIN) capsule 300 mg: ORAL | @ 02:00:00 | NDC 68084078311

## 2013-06-23 MED ADMIN — oxyCODONE IR (ROXICODONE) tablet 15 mg: ORAL | @ 14:00:00 | NDC 68084035411

## 2013-06-23 MED ADMIN — buPROPion SR (WELLBUTRIN SR) tablet 150 mg: ORAL | @ 10:00:00 | NDC 51079039201

## 2013-06-23 MED ADMIN — gabapentin (NEURONTIN) capsule 300 mg: ORAL | @ 20:00:00 | NDC 68084078311

## 2013-06-23 MED ADMIN — prazosin (MINIPRESS) capsule 2 mg: ORAL | @ 20:00:00 | NDC 51079063001

## 2013-06-23 MED ADMIN — acetaminophen (TYLENOL) tablet 650 mg: ORAL | @ 10:00:00 | NDC 50580050110

## 2013-06-23 MED ADMIN — timolol (TIMOPTIC) 0.5 % ophthalmic solution 1 Drop: OPHTHALMIC | @ 13:00:00 | NDC 61314022705

## 2013-06-23 MED ADMIN — latanoprost (XALATAN) 0.005 % ophthalmic solution 1 Drop: OPHTHALMIC | @ 01:00:00 | NDC 59762033302

## 2013-06-23 MED ADMIN — gabapentin (NEURONTIN) capsule 300 mg: ORAL | @ 13:00:00 | NDC 68084078311

## 2013-06-23 MED ADMIN — morphine (PF) PCA 150 mg/30 ml: INTRAVENOUS | @ 12:00:00 | NDC 00409602804

## 2013-06-23 MED ADMIN — atenolol (TENORMIN) tablet 100 mg: ORAL | @ 02:00:00 | NDC 62584046711

## 2013-06-23 MED ADMIN — prazosin (MINIPRESS) capsule 2 mg: ORAL | @ 02:00:00 | NDC 51079063001

## 2013-06-23 MED ADMIN — pantoprazole (PROTONIX) tablet 40 mg: ORAL | @ 20:00:00 | NDC 00904623561

## 2013-06-23 MED ADMIN — sodium chloride (NS) flush 5-10 mL: INTRAVENOUS | @ 01:00:00 | NDC 87701099893

## 2013-06-23 MED ADMIN — senna-docusate (PERICOLACE) 8.6-50 mg per tablet 1 Tab: ORAL | @ 22:00:00 | NDC 00039000210

## 2013-06-23 MED ADMIN — acetaminophen (TYLENOL) tablet 650 mg: ORAL | @ 04:00:00 | NDC 50580050110

## 2013-06-23 MED ADMIN — sodium chloride (NS) flush 5-10 mL: INTRAVENOUS | @ 10:00:00 | NDC 87701099893

## 2013-06-23 MED ADMIN — oxyCODONE IR (ROXICODONE) tablet 10-15 mg: ORAL | NDC 68084035411

## 2013-06-23 MED ADMIN — 0.9% sodium chloride infusion: INTRAVENOUS | @ 22:00:00 | NDC 00409798309

## 2013-06-23 MED ADMIN — acetaminophen (TYLENOL) tablet 650 mg: ORAL | @ 22:00:00 | NDC 50580050110

## 2013-06-23 MED ADMIN — prazosin (MINIPRESS) capsule 2 mg: ORAL | @ 13:00:00 | NDC 51079063001

## 2013-06-23 MED ADMIN — venlafaxine-SR (EFFEXOR-XR) capsule 150 mg: ORAL | @ 13:00:00 | NDC 68084069811

## 2013-06-23 MED ADMIN — chlorthalidone (HYGROTEN) tablet 25 mg: ORAL | @ 13:00:00 | NDC 51079005801

## 2013-06-23 MED ADMIN — desipramine (NORPRAMIN) tablet 50 mg: ORAL | @ 02:00:00 | NDC 00781197201

## 2013-06-23 MED ADMIN — zolpidem (AMBIEN) tablet 5 mg: ORAL | @ 02:00:00 | NDC 68084018911

## 2013-06-23 MED ADMIN — sodium chloride (NS) flush 5-10 mL: INTRAVENOUS | @ 17:00:00 | NDC 87701099893

## 2013-06-23 MED ADMIN — acetaminophen (TYLENOL) tablet 650 mg: ORAL | @ 16:00:00 | NDC 50580050110

## 2013-06-23 MED ADMIN — pantoprazole (PROTONIX) tablet 40 mg: ORAL | @ 11:00:00 | NDC 00904623561

## 2013-06-23 MED ADMIN — amLODIPine (NORVASC) tablet 5 mg: ORAL | @ 13:00:00 | NDC 68084025911

## 2013-06-23 MED ADMIN — senna-docusate (PERICOLACE) 8.6-50 mg per tablet 1 Tab: ORAL | @ 13:00:00 | NDC 00039000210

## 2013-06-23 MED FILL — ACETAMINOPHEN 325 MG TABLET: 325 mg | ORAL | Qty: 2

## 2013-06-23 MED FILL — GABAPENTIN 100 MG CAP: 100 mg | ORAL | Qty: 3

## 2013-06-23 MED FILL — KETOROLAC TROMETHAMINE 30 MG/ML INJECTION: 30 mg/mL (1 mL) | INTRAMUSCULAR | Qty: 1

## 2013-06-23 MED FILL — ZOLPIDEM 5 MG TAB: 5 mg | ORAL | Qty: 1

## 2013-06-23 MED FILL — BD POSIFLUSH NORMAL SALINE 0.9 % INJECTION SYRINGE: INTRAMUSCULAR | Qty: 20

## 2013-06-23 MED FILL — CHLORTHALIDONE 25 MG TAB: 25 mg | ORAL | Qty: 1

## 2013-06-23 MED FILL — BUPROPION SR 150 MG TAB: 150 mg | ORAL | Qty: 1

## 2013-06-23 MED FILL — SENNA PLUS 8.6 MG-50 MG TABLET: ORAL | Qty: 1

## 2013-06-23 MED FILL — SODIUM CHLORIDE 0.9 % IV: INTRAVENOUS | Qty: 1000

## 2013-06-23 MED FILL — BD POSIFLUSH NORMAL SALINE 0.9 % INJECTION SYRINGE: INTRAMUSCULAR | Qty: 30

## 2013-06-23 MED FILL — PRAZOSIN 1 MG CAP: 1 mg | ORAL | Qty: 2

## 2013-06-23 MED FILL — DESIPRAMINE 25 MG TAB: 25 mg | ORAL | Qty: 2

## 2013-06-23 MED FILL — BD POSIFLUSH NORMAL SALINE 0.9 % INJECTION SYRINGE: INTRAMUSCULAR | Qty: 10

## 2013-06-23 MED FILL — VENLAFAXINE SR 37.5 MG 24 HR CAP: 37.5 mg | ORAL | Qty: 4

## 2013-06-23 MED FILL — PANTOPRAZOLE 40 MG TAB, DELAYED RELEASE: 40 mg | ORAL | Qty: 1

## 2013-06-23 MED FILL — OXYCODONE 5 MG TAB: 5 mg | ORAL | Qty: 2

## 2013-06-23 MED FILL — AMLODIPINE 5 MG TAB: 5 mg | ORAL | Qty: 1

## 2013-06-23 MED FILL — ATENOLOL 50 MG TAB: 50 mg | ORAL | Qty: 2

## 2013-06-23 MED FILL — MILK OF MAGNESIA 400 MG/5 ML ORAL SUSPENSION: 400 mg/5 mL | ORAL | Qty: 30

## 2013-06-23 MED FILL — OXYCODONE 5 MG TAB: 5 mg | ORAL | Qty: 3

## 2013-06-23 NOTE — Progress Notes (Signed)
[name]  Spine Progress Note          Donnetta Hailraci Deanna Cortez, [place]  [place]:  [phone]    Daily Progress Note: 06/23/2013    POD: 2 Days Post-Op  S/P: T10-S1 POSTERIOR LUMBAR FUSION    Assessment/Plan:   1.  Pain:  C/O back pain along surgical incision.  Will change PCA to po pain medication.  Doing well on toradol, tylenol and oxycodone. Has used 22 mg of IV morphine and 50 mg of po oxycodone.  Will increase prn oxycodone to 15 mg every 3 hours as needed, continuous pulse ox to ensure pt does not become over sedated.  May need long acting narcotic.  2.  Mobility:  Has been OOB, has TLSO.  PT/OT to eval.  3.  Diet:  Will advance diet as tolerated.  Denies flatus.  4.  VTE Prophylaxes: TEDS & SCDs in place.  5.  Drains:  Foley and hemovac to be removed today  6.  HTN:  Bp well controlled on home meds.  7.  GERD:  Denies symptoms at this time.  8.  Obesity:  BMI 42.45    Care Plan/Dispo: To [facility] once medically stable      Subjective:   SUBJECTIVE:      Denies numbness or tingling, chest pain, nausea, vomiting, difficulty swallowing, headache or dyspnea.    Objective:   Physical Exam:     BP 130/49   Pulse 83   Temp(Src) 99.5 ??F (37.5 ??C)   Resp 16   Ht 5' 5.75" (1.67 m)   Wt 118.389 kg (261 lb)   BMI 42.45 kg/m2   SpO2 94% O2 Flow Rate (L/min): 3 l/min O2 Device: CPAP nasal    Temp (24hrs), Avg:98.2 ??F (36.8 ??C), Min:97.7 ??F (36.5 ??C), Max:99.5 ??F (37.5 ??C)        04/14 1900 - 04/16 0659  In: 2540 [P.O.:100; I.V.:2300]  Out: 4960 [Urine:3420; Drains:1090]    General:  Alert, cooperative, no acute distress.   HEENT: NC. Atraumatic. Anicteric Sclera. Moist oral mucosa.   Lungs:   Clear to auscultation bilaterally. No crackles/wheezes.   Heart:  Regular rate and rhythm, No murmur, click, rub or gallop.   Abdomen:  GU:   Soft, non-tender. Non distended. + bowel sounds.   Foley with yellow urine.   Extremities: Extremities normal, no cyanosis or edema. +2 pulses all  extremities   Skin: Skin color, texture, turgor normal.    Neurologic:      Musculoskeletal:    Incision: PERRLA, A/O x 3 with no focal deficits. Speech clear and appropriate, positive strength/ROM bilat upper/lower ext. Sensation to light touch intact.   Homan's sign negative in bilateral lower extremities.  Calves soft, supple, non-tender upon palpation or with passive stretch.    Clean, dry and intact. No significant erythema or swelling.    Psych:    Cooperative. Not anxious or agitated.     Data Review:       24 Hour Results:  Recent Results (from the past 24 hour(s))   GLUCOSE, POC    Collection Time     06/22/13 12:01 PM       Result Value Ref Range    Glucose (POC) 122 (*) 65 - 100 mg/dL    Performed by Aleen CampiJohnson Tashena     GLUCOSE, POC    Collection Time     06/22/13  4:50 PM       Result Value Ref Range  Glucose (POC) 117 (*) 65 - 100 mg/dL    Performed by CHAVIS WHITNEY     GLUCOSE, POC    Collection Time     06/22/13  9:26 PM       Result Value Ref Range    Glucose (POC) 130 (*) 65 - 100 mg/dL    Performed by Madie Renoontento Megan     HEMOGLOBIN    Collection Time     06/23/13  2:37 AM       Result Value Ref Range    HGB 8.4 (*) 11.5 - 16.0 g/dL   GLUCOSE, POC    Collection Time     06/23/13  7:53 AM       Result Value Ref Range    Glucose (POC) 123 (*) 65 - 100 mg/dL    Performed by Sheilah MinsKILBY ANDREW         Medications reviewed as per [facility]        Barbee Shropshireraci A Kameko Hukill, NP

## 2013-06-23 NOTE — Progress Notes (Addendum)
1750  Paged on call physician. Pt has not voided since catheter removal at 1030. Pt attempted to void and voided less than 20 mL. The pt was bladder scanned which showed 163 mL retained in the bladder.     1800  Spoke with Dr. Ignacia PalmaHanzlik, orders received to start fluids. RN will monitor pt.

## 2013-06-23 NOTE — Progress Notes (Signed)
Bedside and Verbal shift change report given to Katrina, RN (oncoming nurse) by Megan, RN (offgoing nurse). Report included the following information SBAR, Kardex, Intake/Output, MAR, Recent Results, Med Rec Status and Cardiac Rhythm NSR.

## 2013-06-23 NOTE — Progress Notes (Signed)
Problem: Mobility Impaired (Adult and Pediatric)  Goal: *Acute Goals and Plan of Care (Insert Text)  Physical Therapy Goals  Initiated 06/22/2013    1. Patient will move from supine to sit and sit to supine in bed with modified independence within 4 days.  2. Patient will perform sit to stand with modified independence within 4 days.  3. Patient will ambulate with supervision/set-up for 100 feet with the least restrictive device within 4 days.  4. Patient will ascend/descend 2 stairs with 1 handrail(s) with minimal assistance/contact guard assist within 4 days.  5. Patient will verbalize and demonstrate understanding of spinal precautions (No bending, lifting greater than 5 lbs, or twisting; log-roll technique; frequent repositioning as instructed) within 4 days.  PHYSICAL THERAPY TREATMENT  Patient: Deanna BostonDiana Cortez ([age] y.o. female)  Date: 06/23/2013  Diagnosis: STENOSIS   Spinal stenosis, lumbar region, without neurogenic claudication  Spinal stenosis, lumbar region, without neurogenic claudication <principal problem not specified>  Procedure(s) (LRB):  T10-S1 POSTERIOR LUMBAR FUSION (N/A) 2 Days Post-Op  Precautions: Back (TLSO OOB)      ASSESSMENT:  Pt progressing well; continues to have some pain post-op but reports pre-op symptoms have largely resolved. Received with pt already OOB to chair; tolerated transfers, ambulation with RW, and seated therex. Reinforced back precautions, educated on PT plan of care, BLE therex pt can perform to improve strength. Pt was independent prior to admission, would benefit from continued multidisciplinary therapy, will tolerate 3 hours of therapy per day. Recommend d/c to acute rehab once medically stable.    Progression toward goals:  [X]       Improving appropriately and progressing toward goals  [ ]       Improving slowly and progressing toward goals  [ ]       Not making progress toward goals and plan of care will be adjusted       PLAN:  Patient continues to benefit from skilled  intervention to address the above impairments.  Continue treatment per established plan of care.  Discharge Recommendations:  Inpatient Rehab  Further Equipment Recommendations for Discharge:  TBD       SUBJECTIVE:   Patient stated ???I'm doing better.???   The patient stated 3/3 back precautions. Reviewed all 3 with patient.      OBJECTIVE DATA SUMMARY:   Critical Behavior:  Neurologic State: Alert, Appropriate for age  Orientation Level: Oriented X4  Cognition: Appropriate decision making, Appropriate for age attention/concentration, Appropriate safety awareness, Follows commands  Safety/Judgement: Awareness of environment, Fall prevention, Good awareness of safety precautions, Home safety, Insight into deficits  Functional Mobility Training:  Transfers:  Sit to Stand: CGA  Stand to Sit: CGA  Balance:  Sitting: Intact  Standing: Intact;With support  Standing - Static: Good  Standing - Dynamic : Fair  Ambulation/Gait Training:  Distance (ft): 35 Feet (ft)  Assistive Device: Walker, rolling  Ambulation - Level of Assistance: Minimal assistance  Gait Abnormalities: Antalgic (reports R hip pain with ambulation)  Base of Support: Widened  Speed/Cadence: Slow  Therapeutic Exercises:   BLE therex 2x10 (ankle pumps, kicks, marches). Educated pt on performing within a pain-free range.  Pain:  Reports 4/10 pain, used PCA t/o session, RN aware.  Activity Tolerance:   Good  Please refer to the flowsheet for vital signs taken during this treatment.  After treatment:   [X]   Patient left in no apparent distress sitting up in chair  [ ]   Patient left in no apparent distress in bed  [X]   Call bell left within reach  [X]   Nursing notified  [X]   Caregiver present  [ ]   Bed alarm activated      COMMUNICATION/COLLABORATION:   The patient???s plan of care was discussed with: Registered Nurse    Joslyn DevonBrenna M Randolph, PT   Time Calculation: 16 mins

## 2013-06-23 NOTE — Other (Signed)
Bedside and Verbal shift change report given to Megan, RN  (oncoming nurse) by Jhase Creppel, RN (offgoing nurse). Report included the following information SBAR, MAR and Recent Results.       I have read and agree with Tiffany Bell's, SN documentation.

## 2013-06-23 NOTE — Progress Notes (Signed)
Problem: Mobility Impaired (Adult and Pediatric)  Goal: *Acute Goals and Plan of Care (Insert Text)  Physical Therapy Goals  Initiated 06/22/2013    1. Patient will move from supine to sit and sit to supine in bed with modified independence within 4 days.  2. Patient will perform sit to stand with modified independence within 4 days.  3. Patient will ambulate with supervision/set-up for 100 feet with the least restrictive device within 4 days.  4. Patient will ascend/descend 2 stairs with 1 handrail(s) with minimal assistance/contact guard assist within 4 days.  5. Patient will verbalize and demonstrate understanding of spinal precautions (No bending, lifting greater than 5 lbs, or twisting; log-roll technique; frequent repositioning as instructed) within 4 days.  PHYSICAL THERAPY TREATMENT  Patient: Deanna Cortez ([age] y.o. female)  Date: 06/23/2013  Diagnosis: STENOSIS   Spinal stenosis, lumbar region, without neurogenic claudication  Spinal stenosis, lumbar region, without neurogenic claudication <principal problem not specified>  Procedure(s) (LRB):  T10-S1 POSTERIOR LUMBAR FUSION (N/A) 2 Days Post-Op  Precautions: Back (TLSO OOB)      ASSESSMENT:  Patient is asleep upon arrival but wakes up easily. She is agreeable to PT. She is contact guard to logroll to the side of the bed and requires cues for safe hand placement with transfers. She requests to ambulate to the bathroom s/p foley removal - no success. She is able to ambulate another 50 feet with rolling walker support. Her gait is limited due to fatigue and back pain. She is planning on discharge to Inpatient Rehab and then home with her daughter. Vitals remain stable with PT: BP 132/52, 02 sats 96% room air, heart rate 79 bpm with activity.  Progression toward goals:  [X]       Improving appropriately and progressing toward goals  [ ]       Improving slowly and progressing toward goals  [ ]       Not making progress toward goals and plan of care will be adjusted        PLAN:  Patient continues to benefit from skilled intervention to address the above impairments.  Continue treatment per established plan of care.  Discharge Recommendations:  Inpatient Rehab  Further Equipment Recommendations for Discharge:  To be addressed by rehab       SUBJECTIVE:   Patient stated ???I should try to go to the bathroom.???   The patient stated 3/3 back precautions. Reviewed all 3 with patient.      OBJECTIVE DATA SUMMARY:   Critical Behavior:  Neurologic State: Alert, Appropriate for age  Orientation Level: Oriented X4  Cognition: Appropriate decision making, Appropriate for age attention/concentration, Appropriate safety awareness, Follows commands  Safety/Judgement: Awareness of environment, Fall prevention, Good awareness of safety precautions, Home safety, Insight into deficits  Functional Mobility Training:  Bed Mobility:  Log Rolling: Additional time;Supervision;Verbal cues  Supine to Sit: Additional time;CGA;Verbal cues (cues for logroll)  Sit to Supine:  (Up in chair.)  Scooting: Additional time  Brace donned with  minimal assistance/contact guard assist   Transfers:  Sit to Stand: Additional time;CGA;Verbal cues (cues for hand placement.)  Stand to Sit: Additional time;CGA  Balance:  Sitting: Intact  Standing: Intact;With support  Standing - Static: Good  Standing - Dynamic : Fair  Ambulation/Gait Training:  Distance (ft): 50 Feet (ft) (and 15 feet to the bathroom)  Assistive Device: Brace/Splint;Gait belt;Walker, rolling  Ambulation - Level of Assistance: CGA  Gait Abnormalities: Antalgic  Base of Support: Widened  Speed/Cadence: Slow  Step  Length: Right shortened;Left shortened              Gait limited due to fatigue and back pain.  Therapeutic Exercises:   Patient is able to recall sitting exercises.  Pain:  Pain Scale 1: Numeric (0 - 10)  Pain Intensity 1: 0  Pain Location 1: Back  Pain Orientation 1: Lower;Mid  Pain Description 1: Aching  Pain Intervention(s) 1: Medication (see MAR)     After treatment:   [X]   Patient left in no apparent distress sitting up in chair  [ ]   Patient left in no apparent distress in bed  [X]   Call bell left within reach  [X]   Nursing notified and present.  [ ]   Caregiver present  [ ]   Bed alarm activated      COMMUNICATION/COLLABORATION:   The patient???s plan of care was discussed with: Registered Nurse    Dayton BailiffKirsten A Carolyn Sylvia, PT   Time Calculation: 24 mins

## 2013-06-23 NOTE — Progress Notes (Addendum)
Orthopedic Spine Progress Note  Post Op day: 2 Days Post-Op    June 23, 2013 8:12 AM   Admit Date: 06/21/2013  Procedure: Procedure(s):  T10-S1 POSTERIOR LUMBAR FUSION    Subjective:     Deanna Cortez has no complaints. Pain is well controlled. No dizziness/dyspnea. Passing gas.  Tolerating diet.  No N/V.    Pain Control:   Pain Assessment  Pain Scale 1: Numeric (0 - 10)  Pain Intensity 1: 4  Pain Onset 1: Post-Op  Pain Location 1: Back  Pain Orientation 1: Lower, Mid  Pain Description 1: Aching  Pain Intervention(s) 1: Encouraged PCA, Repositioned    Objective:          Physical Exam:  General:  Alert and oriented. No acute distress.   Heart:  Respirations unlabored.   Abdomen:   Extremities: Soft, non-tender.  No evidence of cyanosis. Pulses palpable in both upper and lower extremities.       Neurologic:  Musculoskeletal:  No new motor deficits. Neurovascular exam within normal limits.  Sensation stable.  Motor: unchanged C5-T1 and L2-S1.   Homan's sign negative in bilateral lower extremities.  Calves soft, nontender upon palpation and with passive twitch.  Moves both upper and lower extremities.   Incision: clean, dry, and intact.  No significant erythema or swelling.  No active drainage noted.        Vital Signs:    Blood pressure 130/49, pulse 83, temperature 99.5 ??F (37.5 ??C), resp. rate 16, height 5' 5.75" (1.67 m), weight 118.389 kg (261 lb), SpO2 94 %.  Temp (24hrs), Avg:98.2 ??F (36.8 ??C), Min:97.7 ??F (36.5 ??C), Max:99.5 ??F (37.5 ??C)      LAB:    Recent Labs      06/23/13   0237   HGB  8.4*     Lab Results   Component Value Date/Time    Sodium 137 06/22/2013  2:50 AM    Potassium 3.7 06/22/2013  2:50 AM    Chloride 103 06/22/2013  2:50 AM    CO2 26 06/22/2013  2:50 AM    Glucose 184 06/22/2013  2:50 AM    BUN 10 06/22/2013  2:50 AM    Creatinine 0.56 06/22/2013  2:50 AM    Calcium 7.9 06/22/2013  2:50 AM       Intake/Output:   04/14 1900 - 04/16 0659  In: 2540 [P.O.:100; I.V.:2300]  Out: 4960 [Urine:3420;  Drains:1090]    PT/OT:   Gait:  Gait  Base of Support: Widened  Speed/Cadence: Slow  Step Length: Right shortened, Left shortened  Gait Abnormalities: Antalgic, Decreased step clearance  Ambulation - Level of Assistance: CGA  Distance (ft): 5 Feet (ft) (bed to chair)  Assistive Device: Gait belt, Walker, rolling, Brace/Splint                 Assessment:   Patient is 2 Days Post-Op s/p Procedure(s):  T10-S1 POSTERIOR LUMBAR FUSION    Plan:     1.  Continue PT/OT  2.  Continue established methods of pain control  3.  VTE Prophylaxes - TEDS &/or SCDs   4. Expected acute blood loss anemia-Hgb is 8.4. Currently asymptomatic. Will monitor.  5. Advance to regular diet at lunch time  6. Discontinue foley  7. Transfer to spine floor  8. Discharge pending--rehab      Signed By: Anna GenreAllison S Kylen Ismael, PA-C

## 2013-06-23 NOTE — Progress Notes (Signed)
Problem: Self Care Deficits Care Plan (Adult)  Goal: *Acute Goals and Plan of Care (Insert Text)  Occupational Therapy Goals  Initiated 06/22/2013    1. Patient will perform lower body dressing with supervision/set-up using AE PRN within 7 days.  2. Patient will perform bathing with minimal assistance/contact guard assist using most appropriate DME within 7 days.   3. Patient will grooming standing at sink for 3 minutes at supervision/set-up within 7 days.   4. Patient will don/doff back brace at minimal assistance/contact guard assist within 7 days.  5. Patient will verbalize/demonstrate 3/3 back precautions during ADL tasks without cues within 7 days.     OCCUPATIONAL THERAPY TREATMENT  Patient: Deanna Cortez ([age] y.o. female)  Date: 06/23/2013  Diagnosis: STENOSIS   Spinal stenosis, lumbar region, without neurogenic claudication  Spinal stenosis, lumbar region, without neurogenic claudication <principal problem not specified>  Procedure(s) (LRB):  T10-S1 POSTERIOR LUMBAR FUSION (N/A) 2 Days Post-Op  Precautions: Back (TLSO OOB)      ASSESSMENT:  Pt received supine in bed with daughter bedside, agreeable to therapy session. Pt able to verbalize 3/3 back precautions. Pt continues to have pain along incision site and with movement, requiring increased time to complete functional tasks. Pt completed bed mobility with overall CGA, requiring verbal cues for correct sequencing of log roll technique. Pt required min-modA to don TLSO brace standing with CGA for support with using RW. Educated pt on various alternatives to don TLSO brace; pt and daughter verbalized understanding. Pt familiar with AE for LB dressing; pt able to complete socks with overall supervision using AE. Pt did require increased time for tasks and cues to maintain precautions with movements and functional tasks. Pt would benefit from rehab at discharge to maximize pt safety and independence with self-care prior to returning home, will tolerate 3 hours of  therapy per day.  Progression toward goals:  [X]           Improving appropriately and progressing toward goals  [ ]           Improving slowly and progressing toward goals  [ ]           Not making progress toward goals and plan of care will be adjusted       PLAN:  Patient continues to benefit from skilled intervention to address the above impairments.  Continue treatment per established plan of care.  Discharge Recommendations:  Rehab  Further Equipment Recommendations for Discharge:  TBD       SUBJECTIVE:   Patient stated ???It hurts when my incision drags across the bed.???  The patient stated 3/3 back precautions. Reviewed all 3 with patient.      OBJECTIVE DATA SUMMARY:   Cognitive/Behavioral Status:  Neurologic State: Alert, Appropriate for age  Orientation Level: Oriented X4  Cognition: Appropriate decision making, Appropriate for age attention/concentration, Appropriate safety awareness, Follows commands  Safety/Judgement: Awareness of environment, Fall prevention, Good awareness of safety precautions, Home safety, Insight into deficits  Functional Mobility and Transfers for ADLs:  Bed Mobility:     Supine to Sit: CGA;Additional time;Verbal cues  Sit to Supine: CGA     Transfers:  Sit to Stand: CGA;Verbal cues     Bed to Chair: CGA;Verbal cues;Additional time  Balance:  Sitting: Intact  Standing: Intact;With support  Standing - Static: Good  Standing - Dynamic : Fair  ADL Intervention and Instruction:                      Upper Body Dressing Assistance  Orthotics(Brace): Minimum assistance;Compensatory technique training    Lower Body Dressing Assistance  Socks: Modified independent  Leg Crossed Method Used: No  Position Performed: Seated in chair  Adaptive Equipment Used: Dressing stick;Reacher;Sock aid         Cognitive Retraining  Safety/Judgement: Awareness of environment;Fall prevention;Good awareness of safety  precautions;Home safety;Insight into deficits  Bathing: Patient instructed and indicated understanding of use of long-handled bath sponge when cleared by doctor to bathe.  Dressing brace: Patient instructed and demonstrated to don/doff velcro on brace using dominant side, keeping non-dominant side intact. Patient instructed and demonstrated in meantime of being able to stand to don/doff brace, to don/doff seated using lap and bed/chair surface to support brace while manipulating. Pt reports she has been donning brace standing against wall; educated pt she may also leave both sides of brace loose and don overhead then tighten once positioned correctly.  Home safety: Patient instructed and indicated understanding on home modifications and safety (raise height of ADL objects, appropriate height of chair surfaces, recliner safety, change of floor surfaces, clear pathways) to increase independence and fall prevention with good understanding.   Standing: Patient instructed to walk up to sink/counter top/surfaces, step into walker, square off while using objects, slide objects along surfaces, to increase adherence to back precautions and fall prevention with good understanding.  Patient instructed to increase amount of time standing in order to increase independence and tolerance with ADLs.      Pain:  Pain Scale 1: Numeric (0 - 10)  Pain Intensity 1: 4  Pain Location 1: Back  Pain Orientation 1: Lower;Mid  Pain Description 1: Aching  Pain Intervention(s) 1: Medication (see MAR)  Activity Tolerance:   VSS throughout session. Primarily limitation is pain.    Please refer to the flowsheet for vital signs taken during this treatment.  After treatment:   [X]  Patient left in no apparent distress sitting up in chair  [ ]  Patient left in no apparent distress in bed  [X]  Call bell left within reach  [X]  Nursing notified  [X]  Caregiver present  [ ]  Bed alarm activated      COMMUNICATION/COLLABORATION:   The patient???s plan of care  was discussed with: Physical Therapist and Registered Nurse    Elray McgregorAmy L Greene, OT  Time Calculation: 23 mins

## 2013-06-24 LAB — GLUCOSE, POC
Glucose (POC): 123 mg/dL — ABNORMAL HIGH (ref 65–100)
Glucose (POC): 114 mg/dL — ABNORMAL HIGH (ref 65–100)
Glucose (POC): 141 mg/dL — ABNORMAL HIGH (ref 65–100)

## 2013-06-24 MED ORDER — DESVENLAFAXINE SUCCINATE ER 50 MG 24 HR TABLET
50 mg | Freq: Every day | ORAL | Status: DC
Start: 2013-06-24 — End: 2013-06-24

## 2013-06-24 MED ORDER — DIAZEPAM 5 MG TAB
5 mg | ORAL_TABLET | Freq: Four times a day (QID) | ORAL | Status: AC | PRN
Start: 2013-06-24 — End: ?

## 2013-06-24 MED ORDER — OXYCODONE 10 MG TAB
10 mg | ORAL_TABLET | ORAL | Status: AC | PRN
Start: 2013-06-24 — End: ?

## 2013-06-24 MED ADMIN — pantoprazole (PROTONIX) tablet 40 mg: ORAL | @ 11:00:00 | NDC 00904623561

## 2013-06-24 MED ADMIN — senna-docusate (PERICOLACE) 8.6-50 mg per tablet 1 Tab: ORAL | @ 13:00:00 | NDC 00039000210

## 2013-06-24 MED ADMIN — gabapentin (NEURONTIN) capsule 300 mg: ORAL | @ 02:00:00 | NDC 68084078311

## 2013-06-24 MED ADMIN — gabapentin (NEURONTIN) capsule 300 mg: ORAL | @ 13:00:00 | NDC 68084078311

## 2013-06-24 MED ADMIN — pantoprazole (PROTONIX) tablet 40 mg: ORAL | @ 23:00:00 | NDC 00008060701

## 2013-06-24 MED ADMIN — bisacodyl (DULCOLAX) suppository 10 mg: RECTAL | @ 20:00:00 | NDC 00713010950

## 2013-06-24 MED ADMIN — acetaminophen (TYLENOL) tablet 650 mg: ORAL | @ 03:00:00 | NDC 50580050110

## 2013-06-24 MED ADMIN — oxyCODONE IR (ROXICODONE) tablet 10-15 mg: ORAL | @ 04:00:00 | NDC 00406055223

## 2013-06-24 MED ADMIN — sodium chloride 0.9 % bolus infusion 500 mL: INTRAVENOUS | @ 16:00:00 | NDC 00409798309

## 2013-06-24 MED ADMIN — latanoprost (XALATAN) 0.005 % ophthalmic solution 1 Drop: OPHTHALMIC | @ 02:00:00 | NDC 59762033302

## 2013-06-24 MED ADMIN — prazosin (MINIPRESS) capsule 2 mg: ORAL | @ 02:00:00 | NDC 51079063001

## 2013-06-24 MED ADMIN — sodium chloride (NS) flush 5-10 mL: INTRAVENOUS | @ 02:00:00 | NDC 87701099893

## 2013-06-24 MED ADMIN — acetaminophen (TYLENOL) tablet 650 mg: ORAL | @ 10:00:00 | NDC 50580050110

## 2013-06-24 MED ADMIN — oxyCODONE IR (ROXICODONE) tablet 15 mg: ORAL | @ 23:00:00 | NDC 68084035411

## 2013-06-24 MED ADMIN — sodium chloride (NS) flush 5-10 mL: INTRAVENOUS | @ 18:00:00 | NDC 87701099893

## 2013-06-24 MED ADMIN — 0.9% sodium chloride infusion: INTRAVENOUS | @ 10:00:00 | NDC 00409798309

## 2013-06-24 MED ADMIN — acetaminophen (TYLENOL) tablet 650 mg: ORAL | @ 23:00:00 | NDC 50580048790

## 2013-06-24 MED ADMIN — buPROPion SR (WELLBUTRIN SR) tablet 150 mg: ORAL | @ 10:00:00 | NDC 51079039201

## 2013-06-24 MED ADMIN — desipramine (NORPRAMIN) tablet 50 mg: ORAL | @ 02:00:00 | NDC 00781197201

## 2013-06-24 MED ADMIN — gabapentin (NEURONTIN) capsule 300 mg: ORAL | @ 23:00:00 | NDC 53746010205

## 2013-06-24 MED ADMIN — atenolol (TENORMIN) tablet 100 mg: ORAL | @ 02:00:00 | NDC 62584046711

## 2013-06-24 MED ADMIN — oxyCODONE IR (ROXICODONE) tablet 10-15 mg: ORAL | @ 20:00:00 | NDC 00406055223

## 2013-06-24 MED ADMIN — amLODIPine (NORVASC) tablet 5 mg: ORAL | @ 13:00:00 | NDC 68084025911

## 2013-06-24 MED ADMIN — oxyCODONE IR (ROXICODONE) tablet 10-15 mg: ORAL | @ 13:00:00 | NDC 00406055223

## 2013-06-24 MED ADMIN — acetaminophen (TYLENOL) tablet 650 mg: ORAL | @ 15:00:00 | NDC 50580050110

## 2013-06-24 MED ADMIN — prazosin (MINIPRESS) capsule 2 mg: ORAL | @ 13:00:00 | NDC 51079063001

## 2013-06-24 MED ADMIN — sodium chloride (NS) flush 5-10 mL: INTRAVENOUS | @ 10:00:00 | NDC 87701099893

## 2013-06-24 MED ADMIN — oxyCODONE IR (ROXICODONE) tablet 10-15 mg: ORAL | @ 16:00:00 | NDC 00406055223

## 2013-06-24 MED ADMIN — chlorthalidone (HYGROTEN) tablet 25 mg: ORAL | @ 13:00:00 | NDC 51079005801

## 2013-06-24 MED ADMIN — venlafaxine-SR (EFFEXOR-XR) capsule 150 mg: ORAL | @ 13:00:00 | NDC 68084069811

## 2013-06-24 MED ADMIN — timolol (TIMOPTIC) 0.5 % ophthalmic solution 1 Drop: OPHTHALMIC | @ 14:00:00 | NDC 61314022705

## 2013-06-24 MED ADMIN — zolpidem (AMBIEN) tablet 5 mg: ORAL | @ 02:00:00 | NDC 68084018911

## 2013-06-24 MED FILL — KETOROLAC TROMETHAMINE 30 MG/ML INJECTION: 30 mg/mL (1 mL) | INTRAMUSCULAR | Qty: 1

## 2013-06-24 MED FILL — ACETAMINOPHEN 325 MG TABLET: 325 mg | ORAL | Qty: 2

## 2013-06-24 MED FILL — PRAZOSIN 1 MG CAP: 1 mg | ORAL | Qty: 2

## 2013-06-24 MED FILL — ATENOLOL 50 MG TAB: 50 mg | ORAL | Qty: 2

## 2013-06-24 MED FILL — BUPROPION SR 150 MG TAB: 150 mg | ORAL | Qty: 1

## 2013-06-24 MED FILL — PANTOPRAZOLE 40 MG TAB, DELAYED RELEASE: 40 mg | ORAL | Qty: 1

## 2013-06-24 MED FILL — GABAPENTIN 100 MG CAP: 100 mg | ORAL | Qty: 3

## 2013-06-24 MED FILL — ZOLPIDEM 5 MG TAB: 5 mg | ORAL | Qty: 1

## 2013-06-24 MED FILL — PRISTIQ 50 MG TABLET,EXTENDED RELEASE: 50 mg | ORAL | Qty: 1

## 2013-06-24 MED FILL — BD POSIFLUSH NORMAL SALINE 0.9 % INJECTION SYRINGE: INTRAMUSCULAR | Qty: 30

## 2013-06-24 MED FILL — DESIPRAMINE 25 MG TAB: 25 mg | ORAL | Qty: 2

## 2013-06-24 MED FILL — XALATAN 0.005 % EYE DROPS: 0.005 % | OPHTHALMIC | Qty: 2.5

## 2013-06-24 MED FILL — VENLAFAXINE SR 37.5 MG 24 HR CAP: 37.5 mg | ORAL | Qty: 4

## 2013-06-24 MED FILL — SODIUM CHLORIDE 0.9 % IV: INTRAVENOUS | Qty: 500

## 2013-06-24 MED FILL — OXYCODONE 5 MG TAB: 5 mg | ORAL | Qty: 2

## 2013-06-24 MED FILL — AMLODIPINE 5 MG TAB: 5 mg | ORAL | Qty: 1

## 2013-06-24 MED FILL — TIMOLOL MALEATE 0.5 % EYE DROPS: 0.5 % | OPHTHALMIC | Qty: 5

## 2013-06-24 MED FILL — GABAPENTIN 300 MG CAP: 300 mg | ORAL | Qty: 1

## 2013-06-24 MED FILL — BISAC-EVAC 10 MG RECTAL SUPPOSITORY: 10 mg | RECTAL | Qty: 1

## 2013-06-24 MED FILL — BD POSIFLUSH NORMAL SALINE 0.9 % INJECTION SYRINGE: INTRAMUSCULAR | Qty: 10

## 2013-06-24 MED FILL — OXYCODONE 5 MG TAB: 5 mg | ORAL | Qty: 3

## 2013-06-24 MED FILL — CHLORTHALIDONE 25 MG TAB: 25 mg | ORAL | Qty: 1

## 2013-06-24 MED FILL — SENNA PLUS 8.6 MG-50 MG TABLET: ORAL | Qty: 1

## 2013-06-24 NOTE — Progress Notes (Signed)
Bedside and Verbal shift change report given to Katrina, Charity fundraiserN (Cabin crewoncoming nurse) by Aundra MilletMegan, RN (offgoing nurse). Report included the following information SBAR, Kardex, Intake/Output, MAR, Recent Results, Med Rec Status and Cardiac Rhythm NSR.

## 2013-06-24 NOTE — Discharge Summary (Signed)
[facility]   [address]  [place] [id number]    Oluwaseun Bruyere A. Dwain SarnaWakefield, NP  [facility]  [place] [phone]    DISCHARGE SUMMARY     Patient: Deanna BostonDiana Visser                             Medical Record Number: [id number]     DOB: February 16, 1941  Age: 73 y.o.    Admit Date: 06/21/2013  Discharge Date: 06/24/2013      Admission Diagnosis: STENOSIS   Spinal stenosis, lumbar region, without neurogenic claudication  Spinal stenosis, lumbar region, without neurogenic claudication  Discharge Diagnosis: STENOSIS     Procedures:  T10-S1 POSTERIOR LUMBAR FUSION    Surgeon: Kem ParkinsonJed Vanichkachorn, MD      Complications: None     History of Present Illness:  Deanna BostonDiana Nash is a 73 y.o. female who has had a previous L3 to L5 fusion, underwent a lateral interbody fusion at L2-L3, was initially did well, but has had a compression fracture of L2 with collapse and subsidence of the L2-L3 implant with recurrent stenosis centrally as well as foraminally at L2-L3.  After failing conservative therapy and a discussion of the risks, benefits, alternatives, rehab concerns, and potential complications of surgery, she consented to undergo a T10-S1 POSTERIOR LUMBAR FUSION.    Hospital Course:  Deanna BostonDiana Vanriper tolerated the procedure well.  She was transferred  to the recovery room in stable condition.  After a brief stay the patinet was then [name].  On postoperative day #1, the dressing was clean and dry, she was neurovascularly intact. The patient was afebrile and vital signs were stable.  Calves were soft and non-tender bilaterally. On postoperative day  # 2, the patient was tolerating a regular diet and making slow progress with physical therapy.  Hemoglobin prior to discharge were   Lab Results   Component Value Date/Time    HGB 8.4 06/23/2013  2:37 AM    Deanna Bostoniana Tartt made satisfactory progress with physical therapy and was discharged to Rehab in stable condition on  postoperative day 3.  She was provided with routine postoperative instructions and advised to follow up in Kem ParkinsonJed Vanichkachorn, MD's office in 2 weeks following discharge from the hospital.       [name]     Discharge Medications:  Current Discharge Medication List      START taking these medications    Details   diazepam (VALIUM) 5 mg tablet Take 1 Tab by mouth every six (6) hours as needed. Max Daily Amount: 20 mg.  Qty: 40 Tab, Refills: 0         CONTINUE these medications which have NOT CHANGED    Details   carisoprodol (SOMA) 250 mg tablet Take 250 mg by mouth every eight (8) hours as needed for Muscle Spasm(s).      lansoprazole (PREVACID) 30 mg capsule Take  by mouth two (2) times a day.      desipramine (NORPRAMIN) 50 mg tablet Take 50 mg by mouth daily.      buPROPion XL (WELLBUTRIN XL) 150 mg tablet Take 150 mg by mouth every morning.      Bifidobacterium Infantis (ALIGN) 4 mg cap Take 4 mg by mouth daily.      prazosin (MINIPRESS) 2 mg capsule Take 2 mg by mouth three (3) times daily.  amLODIPine (NORVASC) 5 mg tablet Take 5 mg by mouth daily.      atenolol (TENORMIN) 100 mg tablet Take 100 mg by mouth nightly.      zolpidem (AMBIEN) 10 mg tablet Take 10 mg by mouth nightly as needed for Sleep.      chlorthalidone (HYGROTEN) 25 mg tablet Take 25 mg by mouth daily.      Desvenlafaxine (PRISTIQ) 100 mg Tb24 Take 100 mg by mouth daily.      clonazePAM (KLONOPIN) 0.5 mg tablet Take 0.5 mg by mouth nightly as needed.      gabapentin (NEURONTIN) 300 mg capsule Take 300 mg by mouth three (3) times daily.      timolol maleate 0.5 % drpd ophthalmic solution Administer 1 Drop to both eyes daily.      latanoprost (XALATAN) 0.005 % ophthalmic solution Administer 1 Drop to both eyes nightly.         STOP taking these medications       oxyCODONE IR (ROXICODONE) 5 mg immediate release tablet Comments:   Reason for Stopping:               Signed by: Barbee Shropshireraci A Duy Lemming, NP  06/24/2013

## 2013-06-24 NOTE — Progress Notes (Signed)
Cm informed that patient is medically stable and has [facility]  No preference was given for ambulance, referral sent via ecin to [facility] for 2 pm, they responded they could pick her up at 3 pm.  EMTALA completed and report can be called to [phone].  YUM! BrandsCaitlin Helton BSW, [place]

## 2013-06-24 NOTE — Progress Notes (Signed)
Problem: Self Care Deficits Care Plan (Adult)  Goal: *Acute Goals and Plan of Care (Insert Text)  Occupational Therapy Goals  Initiated 06/22/2013    1. Patient will perform lower body dressing with supervision/set-up using AE PRN within 7 days.  2. Patient will perform bathing with minimal assistance/contact guard assist using most appropriate DME within 7 days.   3. Patient will grooming standing at sink for 3 minutes at supervision/set-up within 7 days.   4. Patient will don/doff back brace at minimal assistance/contact guard assist within 7 days.  5. Patient will verbalize/demonstrate 3/3 back precautions during ADL tasks without cues within 7 days.     OCCUPATIONAL THERAPY TREATMENT  Patient: Deanna Cortez ([age] y.o. female)  Date: 06/24/2013  Diagnosis: STENOSIS   Spinal stenosis, lumbar region, without neurogenic claudication  Spinal stenosis, lumbar region, without neurogenic claudication <principal problem not specified>  Procedure(s) (LRB):  T10-S1 POSTERIOR LUMBAR FUSION (N/A) 3 Days Post-Op  Precautions: Back (TLSO OOB)      ASSESSMENT:  Pt received resting in bed, agreeable to therapy session. Pt continues to make good progress towards goals. Good awareness of back precautions. Pt able to complete functional supine<>sit and sit<>stand transfers with overall CGA and verbal cues. Pt able to don TLSO brace standing at wall for support with overall minA, mirror for visual assistance. Pt c/o R shoulder discomfort with donning brace this date. Pt educated on compensatory strategies as pt voiced difficulty with bowel/bladder hygiene; pt already has toilet aid tongs, encourage to continue using once discharged. Pt continues to be limited d/t back precautions, pain in back and L hip, impaired standing tolerance. Pt plans to discharge today to [facility]; left pt up in chair with daughter present.  Progression toward goals:  [X]           Improving appropriately and progressing toward goals  [ ]           Improving  slowly and progressing toward goals  [ ]           Not making progress toward goals and plan of care will be adjusted       PLAN:  Patient continues to benefit from skilled intervention to address the above impairments.  Continue treatment per established plan of care.  Discharge Recommendations:  Inpatient Rehab  Further Equipment Recommendations for Discharge:  TBD       SUBJECTIVE:   Patient stated ???I have some pain in my left hip.???  The patient stated 3/3 back precautions. Reviewed all 3 with patient.      OBJECTIVE DATA SUMMARY:   Cognitive/Behavioral Status:  Neurologic State: Alert, Appropriate for age  Orientation Level: Oriented X4  Cognition: Appropriate decision making, Appropriate for age attention/concentration, Appropriate safety awareness, Follows commands  Safety/Judgement: Awareness of environment, Fall prevention, Good awareness of safety precautions, Home safety, Insight into deficits  Functional Mobility and Transfers for ADLs:  Bed Mobility:  Rolling: Supervision  Supine to Sit: CGA;Additional time;Verbal cues  Sit to Supine: Maximum assistance;Assist X1;Additional time;Verbal cues     Transfers:  Sit to Stand: CGA;Verbal cues     Bed to Chair: CGA;Verbal cues  Balance:  Sitting: Intact  Standing: Intact;With support  ADL Intervention and Instruction:                      Upper Body Dressing Assistance  Orthotics(Brace): Minimum assistance;Compensatory technique training              Cognitive Retraining  Safety/Judgement: Awareness of environment;Fall prevention;Good awareness of safety precautions;Home safety;Insight into deficits    Dressing brace: Patient instructed and demonstrated to don/doff velcro on brace using dominant side, keeping non-dominant side intact. Patient instructed and demonstrated in meantime of being able to stand with back against wall to don/doff brace, to don/doff seated using  lap and bed/chair surface to support brace while manipulating.    Pain:  Pain Scale 1: Numeric (0 - 10)  Pain Intensity 1: 3  Pain Location 1: Back  Pain Orientation 1: Lower;Mid  Pain Description 1: Aching  Pain Intervention(s) 1: Medication (see MAR)  Activity Tolerance:   VSS throughout session. Fair tolerance.    Please refer to the flowsheet for vital signs taken during this treatment.  After treatment:   [X]  Patient left in no apparent distress sitting up in chair  [ ]  Patient left in no apparent distress in bed  [X]  Call bell left within reach  [X]  Nursing notified  [X]  Caregiver present  [ ]  Bed alarm activated      COMMUNICATION/COLLABORATION:   The patient???s plan of care was discussed with: Physical Therapist and Registered Nurse    Elray McgregorAmy L Greene, OT  Time Calculation: 21 mins

## 2013-06-24 NOTE — Progress Notes (Signed)
Orthopedic Spine Progress Note  Post Op day: 3 Days Post-Op    June 24, 2013 8:52 AM   Admit Date: 06/21/2013  Procedure: Procedure(s):  T10-S1 POSTERIOR LUMBAR FUSION    Subjective:     Louie BostonDiana Willems has no complaints.  Some left buttock pain. No leg pain.  Tolerating diet.  No N/V.    Pain Control:   Pain Assessment  Pain Scale 1: Numeric (0 - 10)  Pain Intensity 1: 6  Pain Onset 1: 15 min ago  Pain Location 1: Back, Hip  Pain Orientation 1: Lower, Left  Pain Description 1: Aching  Pain Intervention(s) 1: Refused    Objective:          Physical Exam:  General:  Alert and oriented. No acute distress.   Heart:  Respirations unlabored.   Abdomen:   Extremities: Soft, non-tender.  No evidence of cyanosis. Pulses palpable in both upper and lower extremities.       Neurologic:  Musculoskeletal:  No new motor deficits. Neurovascular exam within normal limits.  Sensation stable.  Motor: unchanged C5-T1 and L2-S1.   Homan's sign negative in bilateral lower extremities.  Calves soft, nontender upon palpation and with passive twitch.  Moves both upper and lower extremities.   Incision: clean, dry, and intact.  No significant erythema or swelling.  No active drainage noted.        Vital Signs:    Blood pressure 137/69, pulse 73, temperature 98.2 ??F (36.8 ??C), resp. rate 15, height 5' 5.75" (1.67 m), weight 134 kg (295 lb 6.7 oz), SpO2 94 %.  Temp (24hrs), Avg:98 ??F (36.7 ??C), Min:97.7 ??F (36.5 ??C), Max:98.4 ??F (36.9 ??C)      LAB:    Recent Labs      06/23/13   0237   HGB  8.4*     Lab Results   Component Value Date/Time    Sodium 137 06/22/2013  2:50 AM    Potassium 3.7 06/22/2013  2:50 AM    Chloride 103 06/22/2013  2:50 AM    CO2 26 06/22/2013  2:50 AM    Glucose 184 06/22/2013  2:50 AM    BUN 10 06/22/2013  2:50 AM    Creatinine 0.56 06/22/2013  2:50 AM    Calcium 7.9 06/22/2013  2:50 AM       Intake/Output:   04/15 1900 - 04/17 0659  In: -   Out: 1930 [Urine:1350; Drains:580]    PT/OT:   Gait:  Gait  Base of Support: Widened   Speed/Cadence: Slow  Step Length: Right shortened, Left shortened  Gait Abnormalities: Antalgic  Ambulation - Level of Assistance: CGA  Distance (ft): 50 Feet (ft) (and 15 feet to the bathroom)  Assistive Device: Brace/Splint, Gait belt, Walker, rolling                 Assessment:   Patient is 3 Days Post-Op s/p Procedure(s):  T10-S1 POSTERIOR LUMBAR FUSION    Plan:     1.  Continue PT/OT  2.  Continue established methods of pain control  3.  VTE Prophylaxes - TEDS &/or SCDs   4.  Rehab placement today  5.  Regular diet  6. D/c Hemovac today      Signed By: Kem ParkinsonJed Mikhai Bienvenue, MD

## 2013-06-24 NOTE — Progress Notes (Signed)
TRANSFER - OUT REPORT:    Verbal report given to Marcelino DusterMichelle (name) on Deanna Cortez  being [facility] to [phone]) (unit) for routine progression of care       Report consisted of patient???s Situation, Background, Assessment and   Recommendations(SBAR).     Information from the following report(s) SBAR, MAR and Recent Results was reviewed with the receiving nurse.    Opportunity for questions and clarification was provided.

## 2013-06-24 NOTE — Progress Notes (Signed)
Problem: Mobility Impaired (Adult and Pediatric)  Goal: *Acute Goals and Plan of Care (Insert Text)  Physical Therapy Goals  Initiated 06/22/2013    1. Patient will move from supine to sit and sit to supine in bed with modified independence within 4 days.  2. Patient will perform sit to stand with modified independence within 4 days.  3. Patient will ambulate with supervision/set-up for 100 feet with the least restrictive device within 4 days.  4. Patient will ascend/descend 2 stairs with 1 handrail(s) with minimal assistance/contact guard assist within 4 days.  5. Patient will verbalize and demonstrate understanding of spinal precautions (No bending, lifting greater than 5 lbs, or twisting; log-roll technique; frequent repositioning as instructed) within 4 days.  PHYSICAL THERAPY TREATMENT  Patient: Deanna BostonDiana Cortez ([age] y.o. female)  Date: 06/24/2013  Diagnosis: STENOSIS   Spinal stenosis, lumbar region, without neurogenic claudication  Spinal stenosis, lumbar region, without neurogenic claudication <principal problem not specified>  Procedure(s) (LRB):  T10-S1 POSTERIOR LUMBAR FUSION (N/A) 3 Days Post-Op  Precautions: Back (TLSO OOB)      ASSESSMENT:  Patient continues to make gains toward goals.  Patient received up in chair.  Overall CGA for transfers with RW.  Patient ambulated 125 feet with RW and CGA.  Continues to ambulate with antalgic gait pattern and slow cadence.  Able to state 3/3 back precautions currently.  Returned to supine with max A for LE management using log roll technique.  Patient fatigued at end of session.  Recommend IP rehab at discharge.  Progression toward goals:  [X]       Improving appropriately and progressing toward goals  [ ]       Improving slowly and progressing toward goals  [ ]       Not making progress toward goals and plan of care will be adjusted       PLAN:  Patient continues to benefit from skilled intervention to address the above impairments.  Continue treatment per established plan  of care.  Discharge Recommendations:  Inpatient Rehab  Further Equipment Recommendations for Discharge:  TBD       SUBJECTIVE:   Patient stated ???Getting in and out of bed is the hardest part.???   The patient stated 3/3 back precautions. Reviewed all 3 with patient.      OBJECTIVE DATA SUMMARY:   Critical Behavior:  Neurologic State: Alert  Orientation Level: Oriented X4  Cognition: Appropriate decision making, Appropriate for age attention/concentration, Appropriate safety awareness, Follows commands  Safety/Judgement: Awareness of environment, Fall prevention, Good awareness of safety precautions, Home safety, Insight into deficits  Functional Mobility Training:  Bed Mobility:  Log Rolling: Supervision     Sit to Supine: Maximum assistance;Assist X1;Additional time;Verbal cues           Brace doffed with  moderate assistance    Transfers:  Sit to Stand: CGA;Verbal cues  Stand to Sit: CGA;Verbal cues        Bed to Chair: CGA;Verbal cues                    Balance:  Sitting: Intact  Standing: Intact;With support  Ambulation/Gait Training:  Distance (ft): 125 Feet (ft)  Assistive Device: Gait belt;Brace/Splint;Walker, rolling  Ambulation - Level of Assistance: CGA        Gait Abnormalities: Antalgic;Decreased step clearance        Base of Support: Widened     Speed/Cadence: Pace decreased (<100 feet/min)  Step Length: Right shortened;Left shortened  Stairs:           Therapeutic Exercises:     Pain:  Pain Scale 1: Numeric (0 - 10)  Pain Intensity 1: 6  Pain Location 1: Back;Hip  Pain Orientation 1: Lower;Left  Pain Description 1: Aching  Pain Intervention(s) 1: Refused  Activity Tolerance:   Improving  Please refer to the flowsheet for vital signs taken during this treatment.  After treatment:   [ ]   Patient left in no apparent distress sitting up in chair  [X]   Patient left in no apparent distress in bed  [X]   Call bell left within reach  [X]   Nursing notified  [X]   Caregiver present  [ ]    Bed alarm activated      COMMUNICATION/COLLABORATION:   The patient???s plan of care was discussed with: Registered Nurse    Rennis HardingAshley C Crawford, PT   Time Calculation: 13 mins

## 2013-06-25 LAB — METABOLIC PANEL, COMPREHENSIVE
A-G Ratio: 0.7 — ABNORMAL LOW (ref 1.1–2.2)
ALT (SGPT): 43 U/L (ref 12–78)
AST (SGOT): 37 U/L (ref 15–37)
Albumin: 2.3 g/dL — ABNORMAL LOW (ref 3.5–5.0)
Alk. phosphatase: 108 U/L (ref 45–117)
Anion gap: 8 mmol/L (ref 5–15)
BUN/Creatinine ratio: 14 (ref 12–20)
BUN: 7 MG/DL (ref 6–20)
Bilirubin, total: 0.3 MG/DL (ref 0.2–1.0)
CO2: 28 mmol/L (ref 21–32)
Calcium: 8.5 MG/DL (ref 8.5–10.1)
Chloride: 99 mmol/L (ref 97–108)
Creatinine: 0.5 MG/DL (ref 0.45–1.15)
GFR est AA: >60 mL/min/{1.73_m2} (ref >60)
GFR est non-AA: >60 mL/min/{1.73_m2} (ref >60)
Globulin: 3.5 g/dL (ref 2.0–4.0)
Glucose: 113 mg/dL — ABNORMAL HIGH (ref 65–100)
Potassium: 2.8 mmol/L — ABNORMAL LOW (ref 3.5–5.1)
Protein, total: 5.8 g/dL — ABNORMAL LOW (ref 6.4–8.2)
Sodium: 135 mmol/L — ABNORMAL LOW (ref 136–145)

## 2013-06-25 LAB — CBC W/O DIFF
HCT: 26.4 % — ABNORMAL LOW (ref 35.0–47.0)
HGB: 8.3 g/dL — ABNORMAL LOW (ref 11.5–16.0)
MCH: 27.2 PG (ref 26.0–34.0)
MCHC: 31.4 g/dL (ref 30.0–36.5)
MCV: 86.6 FL (ref 80.0–99.0)
PLATELET: 302 10*3/uL (ref 150–400)
RBC: 3.05 M/uL — ABNORMAL LOW (ref 3.80–5.20)
RDW: 13.4 % (ref 11.5–14.5)
WBC: 8.8 10*3/uL (ref 3.6–11.0)

## 2013-06-25 LAB — VITAMIN D, 25 HYDROXY: Vitamin D 25-Hydroxy: 25.1 ng/mL — ABNORMAL LOW (ref 30–100)

## 2013-06-25 LAB — MAGNESIUM: Magnesium: 1.7 mg/dL (ref 1.6–2.4)

## 2013-06-25 LAB — PHOSPHORUS: Phosphorus: 3 MG/DL (ref 2.5–4.9)

## 2013-06-25 MED ADMIN — Desvenlafaxine SR (PRISTIQ) tablet 100 mg: ORAL | @ 13:00:00 | NDC 63304019130

## 2013-06-25 MED ADMIN — gabapentin (NEURONTIN) capsule 300 mg: ORAL | @ 02:00:00 | NDC 53746010205

## 2013-06-25 MED ADMIN — acetaminophen (TYLENOL) tablet 650 mg: ORAL | @ 16:00:00 | NDC 50580048790

## 2013-06-25 MED ADMIN — oxyCODONE IR (ROXICODONE) tablet 15 mg: ORAL | @ 23:00:00 | NDC 68084035411

## 2013-06-25 MED ADMIN — gabapentin (NEURONTIN) capsule 300 mg: ORAL | @ 17:00:00 | NDC 53746010205

## 2013-06-25 MED ADMIN — pantoprazole (PROTONIX) tablet 40 mg: ORAL | @ 11:00:00 | NDC 82009001190

## 2013-06-25 MED ADMIN — desipramine (NORPRAMIN) tablet 50 mg: ORAL | @ 02:00:00 | NDC 00781197201

## 2013-06-25 MED ADMIN — pantoprazole (PROTONIX) tablet 40 mg: ORAL | @ 22:00:00 | NDC 00008060701

## 2013-06-25 MED ADMIN — oxyCODONE IR (ROXICODONE) tablet 15 mg: ORAL | @ 11:00:00 | NDC 68084035411

## 2013-06-25 MED ADMIN — chlorthalidone (HYGROTEN) tablet 25 mg: ORAL | @ 13:00:00 | NDC 00378022201

## 2013-06-25 MED ADMIN — timolol (TIMOPTIC) 0.5 % ophthalmic solution 1 Drop: OPHTHALMIC | @ 13:00:00 | NDC 61314022705

## 2013-06-25 MED ADMIN — oxyCODONE IR (ROXICODONE) tablet 15 mg: ORAL | @ 02:00:00 | NDC 68084035411

## 2013-06-25 MED ADMIN — pantoprazole (PROTONIX) tablet 40 mg: ORAL | @ 09:00:00 | NDC 00008060701

## 2013-06-25 MED ADMIN — prazosin (MINIPRESS) capsule 2 mg: ORAL | @ 17:00:00 | NDC 00378110101

## 2013-06-25 MED ADMIN — potassium chloride SR (KLOR-CON 10) tablet 40 mEq: ORAL | @ 16:00:00 | NDC 00074780419

## 2013-06-25 MED ADMIN — bisacodyl (DULCOLAX) tablet 15 mg: ORAL | NDC 45802071134

## 2013-06-25 MED ADMIN — ondansetron (ZOFRAN ODT) tablet 4 mg: ORAL | @ 11:00:00 | NDC 00781523806

## 2013-06-25 MED ADMIN — oxyCODONE IR (ROXICODONE) tablet 10 mg: ORAL | @ 20:00:00 | NDC 68084035411

## 2013-06-25 MED ADMIN — zolpidem (AMBIEN) tablet 5 mg: ORAL | @ 02:00:00 | NDC 00904608261

## 2013-06-25 MED ADMIN — buPROPion XL (WELLBUTRIN XL) tablet 150 mg: ORAL | @ 11:00:00 | NDC 10370010150

## 2013-06-25 MED ADMIN — prazosin (MINIPRESS) capsule 2 mg: ORAL | @ 02:00:00 | NDC 00378110101

## 2013-06-25 MED ADMIN — prazosin (MINIPRESS) capsule 2 mg: ORAL | @ 09:00:00 | NDC 00093406701

## 2013-06-25 MED ADMIN — amLODIPine (NORVASC) tablet 5 mg: ORAL | @ 13:00:00 | NDC 59762153002

## 2013-06-25 MED ADMIN — atenolol (TENORMIN) tablet 100 mg: ORAL | @ 02:00:00 | NDC 50742010201

## 2013-06-25 MED ADMIN — senna-docusate (PERICOLACE) 8.6-50 mg per tablet 1 Tab: ORAL | @ 13:00:00 | NDC 63739043201

## 2013-06-25 MED ADMIN — timolol (TIMOPTIC) 0.5 % ophthalmic solution 1 Drop: OPHTHALMIC | @ 02:00:00 | NDC 68682081305

## 2013-06-25 MED ADMIN — acetaminophen (TYLENOL) tablet 650 mg: ORAL | @ 09:00:00 | NDC 50580048790

## 2013-06-25 MED ADMIN — magnesium hydroxide (MILK OF MAGNESIA) oral suspension 30 mL: ORAL | @ 14:00:00 | NDC 96295012501

## 2013-06-25 MED ADMIN — senna-docusate (PERICOLACE) 8.6-50 mg per tablet 1 Tab: ORAL | @ 02:00:00 | NDC 63739043201

## 2013-06-25 MED ADMIN — acetaminophen (TYLENOL) tablet 650 mg: ORAL | @ 22:00:00 | NDC 50580048790

## 2013-06-25 MED ADMIN — latanoprost (XALATAN) 0.005 % ophthalmic solution 1 Drop: OPHTHALMIC | @ 02:00:00 | NDC 00013830301

## 2013-06-25 MED ADMIN — gabapentin (NEURONTIN) capsule 300 mg: ORAL | @ 09:00:00 | NDC 53746010205

## 2013-06-25 MED ADMIN — venlafaxine-SR (EFFEXOR-XR) capsule 150 mg: ORAL | @ 13:00:00 | NDC 68382003616

## 2013-06-25 MED FILL — VENLAFAXINE SR 150 MG 24 HR CAP: 150 mg | ORAL | Qty: 1

## 2013-06-25 MED FILL — ZOLPIDEM 5 MG TAB: 5 mg | ORAL | Qty: 1

## 2013-06-25 MED FILL — K-TAB 10 MEQ TABLET,EXTENDED RELEASE: 10 mEq | ORAL | Qty: 4

## 2013-06-25 MED FILL — PRAZOSIN 1 MG CAP: 1 mg | ORAL | Qty: 2

## 2013-06-25 MED FILL — BUPROPION XL 150 MG 24 HR TAB: 150 mg | ORAL | Qty: 1

## 2013-06-25 MED FILL — ONDANSETRON 4 MG TAB, RAPID DISSOLVE: 4 mg | ORAL | Qty: 1

## 2013-06-25 MED FILL — AMLODIPINE 5 MG TAB: 5 mg | ORAL | Qty: 1

## 2013-06-25 MED FILL — ACETAMINOPHEN 325 MG TABLET: 325 mg | ORAL | Qty: 2

## 2013-06-25 MED FILL — SENNA PLUS 8.6 MG-50 MG TABLET: ORAL | Qty: 1

## 2013-06-25 MED FILL — ATENOLOL 50 MG TAB: 50 mg | ORAL | Qty: 2

## 2013-06-25 MED FILL — OXYCODONE 5 MG TAB: 5 mg | ORAL | Qty: 2

## 2013-06-25 MED FILL — TIMOLOL MALEATE 0.5 % EYE DROPS: 0.5 % | OPHTHALMIC | Qty: 5

## 2013-06-25 MED FILL — GABAPENTIN 300 MG CAP: 300 mg | ORAL | Qty: 1

## 2013-06-25 MED FILL — OXYCODONE 5 MG TAB: 5 mg | ORAL | Qty: 3

## 2013-06-25 MED FILL — PANTOPRAZOLE 40 MG TAB, DELAYED RELEASE: 40 mg | ORAL | Qty: 1

## 2013-06-25 MED FILL — PRISTIQ 50 MG TABLET,EXTENDED RELEASE: 50 mg | ORAL | Qty: 2

## 2013-06-25 MED FILL — CHLORTHALIDONE 25 MG TAB: 25 mg | ORAL | Qty: 1

## 2013-06-25 MED FILL — DESIPRAMINE 25 MG TAB: 25 mg | ORAL | Qty: 2

## 2013-06-26 LAB — URINALYSIS W/ REFLEX CULTURE
Bilirubin: NEGATIVE
Blood: NEGATIVE
Glucose: NEGATIVE mg/dL
Ketone: NEGATIVE mg/dL
Nitrites: POSITIVE — AB
Protein: NEGATIVE mg/dL
Specific gravity: 1.011 (ref 1.003–1.030)
Urobilinogen: 0.2 EU/dL (ref 0.2–1.0)
pH (UA): 5.5 (ref 5.0–8.0)

## 2013-06-26 MED ADMIN — potassium chloride SR (KLOR-CON 10) tablet 40 mEq: ORAL | @ 02:00:00 | NDC 00074780419

## 2013-06-26 MED ADMIN — oxyCODONE IR (ROXICODONE) tablet 10 mg: ORAL | @ 14:00:00 | NDC 68084035411

## 2013-06-26 MED ADMIN — chlorthalidone (HYGROTEN) tablet 25 mg: ORAL | @ 14:00:00 | NDC 00378022201

## 2013-06-26 MED ADMIN — zolpidem (AMBIEN) tablet 5 mg: ORAL | @ 02:00:00 | NDC 00904608261

## 2013-06-26 MED ADMIN — prazosin (MINIPRESS) capsule 2 mg: ORAL | @ 17:00:00 | NDC 00378110101

## 2013-06-26 MED ADMIN — senna-docusate (PERICOLACE) 8.6-50 mg per tablet 1 Tab: ORAL | @ 14:00:00 | NDC 63739043201

## 2013-06-26 MED ADMIN — pantoprazole (PROTONIX) tablet 40 mg: ORAL | @ 10:00:00 | NDC 00008060701

## 2013-06-26 MED ADMIN — acetaminophen (TYLENOL) tablet 650 mg: ORAL | @ 22:00:00 | NDC 50580048790

## 2013-06-26 MED ADMIN — venlafaxine-SR (EFFEXOR-XR) capsule 150 mg: ORAL | @ 14:00:00 | NDC 68382003616

## 2013-06-26 MED ADMIN — amLODIPine (NORVASC) tablet 5 mg: ORAL | @ 14:00:00 | NDC 59762153002

## 2013-06-26 MED ADMIN — cefdinir (OMNICEF) capsule 300 mg: ORAL | @ 18:00:00 | NDC 68001015006

## 2013-06-26 MED ADMIN — latanoprost (XALATAN) 0.005 % ophthalmic solution 1 Drop: OPHTHALMIC | @ 02:00:00 | NDC 00013830301

## 2013-06-26 MED ADMIN — desipramine (NORPRAMIN) tablet 50 mg: ORAL | @ 02:00:00 | NDC 00781197201

## 2013-06-26 MED ADMIN — pantoprazole (PROTONIX) tablet 40 mg: ORAL | @ 22:00:00 | NDC 00008060701

## 2013-06-26 MED ADMIN — oxyCODONE IR (ROXICODONE) tablet 15 mg: ORAL | @ 08:00:00 | NDC 68084035411

## 2013-06-26 MED ADMIN — prazosin (MINIPRESS) capsule 2 mg: ORAL | @ 02:00:00 | NDC 00378110101

## 2013-06-26 MED ADMIN — Desvenlafaxine SR (PRISTIQ) tablet 100 mg: ORAL | @ 14:00:00 | NDC 63304019130

## 2013-06-26 MED ADMIN — gabapentin (NEURONTIN) capsule 300 mg: ORAL | @ 17:00:00 | NDC 53746010205

## 2013-06-26 MED ADMIN — acetaminophen (TYLENOL) tablet 650 mg: ORAL | @ 16:00:00 | NDC 50580048790

## 2013-06-26 MED ADMIN — buPROPion XL (WELLBUTRIN XL) tablet 150 mg: ORAL | @ 10:00:00 | NDC 10370010150

## 2013-06-26 MED ADMIN — acetaminophen (TYLENOL) tablet 650 mg: ORAL | @ 03:00:00 | NDC 50580048790

## 2013-06-26 MED ADMIN — acetaminophen (TYLENOL) tablet 650 mg: ORAL | @ 10:00:00 | NDC 50580048790

## 2013-06-26 MED ADMIN — senna-docusate (PERICOLACE) 8.6-50 mg per tablet 1 Tab: ORAL | @ 02:00:00 | NDC 63739043201

## 2013-06-26 MED ADMIN — gabapentin (NEURONTIN) capsule 300 mg: ORAL | @ 10:00:00 | NDC 53746010205

## 2013-06-26 MED ADMIN — gabapentin (NEURONTIN) capsule 300 mg: ORAL | @ 02:00:00 | NDC 53746010205

## 2013-06-26 MED ADMIN — ferrous sulfate tablet 325 mg: ORAL | @ 22:00:00

## 2013-06-26 MED ADMIN — atenolol (TENORMIN) tablet 100 mg: ORAL | @ 02:00:00 | NDC 50742010201

## 2013-06-26 MED ADMIN — potassium chloride SR (KLOR-CON 10) tablet 40 mEq: ORAL | @ 14:00:00 | NDC 00074780419

## 2013-06-26 MED ADMIN — prazosin (MINIPRESS) capsule 2 mg: ORAL | @ 10:00:00 | NDC 00378110101

## 2013-06-26 MED FILL — ACETAMINOPHEN 325 MG TABLET: 325 mg | ORAL | Qty: 2

## 2013-06-26 MED FILL — OXYCODONE 5 MG TAB: 5 mg | ORAL | Qty: 3

## 2013-06-26 MED FILL — CHLORTHALIDONE 25 MG TAB: 25 mg | ORAL | Qty: 1

## 2013-06-26 MED FILL — DESIPRAMINE 25 MG TAB: 25 mg | ORAL | Qty: 2

## 2013-06-26 MED FILL — PRAZOSIN 1 MG CAP: 1 mg | ORAL | Qty: 2

## 2013-06-26 MED FILL — OXYCODONE 5 MG TAB: 5 mg | ORAL | Qty: 2

## 2013-06-26 MED FILL — ATENOLOL 50 MG TAB: 50 mg | ORAL | Qty: 2

## 2013-06-26 MED FILL — ZOLPIDEM 5 MG TAB: 5 mg | ORAL | Qty: 1

## 2013-06-26 MED FILL — PANTOPRAZOLE 40 MG TAB, DELAYED RELEASE: 40 mg | ORAL | Qty: 1

## 2013-06-26 MED FILL — PRISTIQ 50 MG TABLET,EXTENDED RELEASE: 50 mg | ORAL | Qty: 2

## 2013-06-26 MED FILL — SENNA PLUS 8.6 MG-50 MG TABLET: ORAL | Qty: 1

## 2013-06-26 MED FILL — BUPROPION XL 150 MG 24 HR TAB: 150 mg | ORAL | Qty: 1

## 2013-06-26 MED FILL — K-TAB 10 MEQ TABLET,EXTENDED RELEASE: 10 mEq | ORAL | Qty: 4

## 2013-06-26 MED FILL — FERROUS SULFATE 325 MG (65 MG ELEMENTAL IRON) TAB: 325 mg (65 mg iron) | ORAL | Qty: 1

## 2013-06-26 MED FILL — CEFDINIR 300 MG CAP: 300 mg | ORAL | Qty: 1

## 2013-06-26 MED FILL — GABAPENTIN 300 MG CAP: 300 mg | ORAL | Qty: 1

## 2013-06-26 MED FILL — AMLODIPINE 5 MG TAB: 5 mg | ORAL | Qty: 1

## 2013-06-26 MED FILL — VENLAFAXINE SR 150 MG 24 HR CAP: 150 mg | ORAL | Qty: 1

## 2013-06-27 LAB — METABOLIC PANEL, BASIC
Anion gap: 6 mmol/L (ref 5–15)
BUN/Creatinine ratio: 12 (ref 12–20)
BUN: 6 MG/DL (ref 6–20)
CO2: 30 mmol/L (ref 21–32)
Calcium: 8.7 MG/DL (ref 8.5–10.1)
Chloride: 101 mmol/L (ref 97–108)
Creatinine: 0.51 MG/DL (ref 0.45–1.15)
GFR est AA: >60 mL/min/{1.73_m2} (ref >60)
GFR est non-AA: >60 mL/min/{1.73_m2} (ref >60)
Glucose: 102 mg/dL — ABNORMAL HIGH (ref 65–100)
Potassium: 3.7 mmol/L (ref 3.5–5.1)
Sodium: 137 mmol/L (ref 136–145)

## 2013-06-27 MED ADMIN — acetaminophen (TYLENOL) tablet 650 mg: ORAL | @ 03:00:00 | NDC 50580048790

## 2013-06-27 MED ADMIN — pantoprazole (PROTONIX) tablet 40 mg: ORAL | @ 10:00:00 | NDC 00008060701

## 2013-06-27 MED ADMIN — acetaminophen (TYLENOL) tablet 650 mg: ORAL | @ 16:00:00 | NDC 50580048790

## 2013-06-27 MED ADMIN — oxyCODONE IR (ROXICODONE) tablet 15 mg: ORAL | @ 22:00:00 | NDC 68084035411

## 2013-06-27 MED ADMIN — oxyCODONE IR (ROXICODONE) tablet 15 mg: ORAL | @ 12:00:00 | NDC 68084035411

## 2013-06-27 MED ADMIN — gabapentin (NEURONTIN) capsule 300 mg: ORAL | @ 18:00:00 | NDC 53746010205

## 2013-06-27 MED ADMIN — gabapentin (NEURONTIN) capsule 300 mg: ORAL | @ 10:00:00 | NDC 53746010205

## 2013-06-27 MED ADMIN — ondansetron (ZOFRAN ODT) tablet 4 mg: ORAL | @ 01:00:00 | NDC 68001024616

## 2013-06-27 MED ADMIN — atenolol (TENORMIN) tablet 100 mg: ORAL | @ 01:00:00 | NDC 50742010201

## 2013-06-27 MED ADMIN — latanoprost (XALATAN) 0.005 % ophthalmic solution 1 Drop: OPHTHALMIC | @ 01:00:00 | NDC 00013830301

## 2013-06-27 MED ADMIN — zolpidem (AMBIEN) tablet 5 mg: ORAL | @ 01:00:00 | NDC 00904608261

## 2013-06-27 MED ADMIN — amLODIPine (NORVASC) tablet 5 mg: ORAL | @ 12:00:00 | NDC 59762153002

## 2013-06-27 MED ADMIN — senna-docusate (PERICOLACE) 8.6-50 mg per tablet 1 Tab: ORAL | @ 01:00:00 | NDC 63739043201

## 2013-06-27 MED ADMIN — desipramine (NORPRAMIN) tablet 50 mg: ORAL | @ 01:00:00 | NDC 00781197201

## 2013-06-27 MED ADMIN — prazosin (MINIPRESS) capsule 2 mg: ORAL | @ 18:00:00 | NDC 00378110101

## 2013-06-27 MED ADMIN — ferrous sulfate tablet 325 mg: ORAL | @ 22:00:00

## 2013-06-27 MED ADMIN — potassium chloride SR (KLOR-CON 10) tablet 40 mEq: ORAL | @ 01:00:00 | NDC 00074780419

## 2013-06-27 MED ADMIN — acetaminophen (TYLENOL) tablet 650 mg: ORAL | @ 22:00:00 | NDC 50580048790

## 2013-06-27 MED ADMIN — oxyCODONE IR (ROXICODONE) tablet 15 mg: ORAL | NDC 68084035411

## 2013-06-27 MED ADMIN — cefdinir (OMNICEF) capsule 300 mg: ORAL | @ 14:00:00 | NDC 68001015006

## 2013-06-27 MED ADMIN — Desvenlafaxine SR (PRISTIQ) tablet 100 mg: ORAL | @ 12:00:00 | NDC 63304019130

## 2013-06-27 MED ADMIN — multivitamin with iron tablet 1 Tab: ORAL | @ 12:00:00 | NDC 54838000980

## 2013-06-27 MED ADMIN — lactobac ac& pc-s.therm-b.anim (FLORA Q): ORAL | @ 01:00:00 | NDC 31257056030

## 2013-06-27 MED ADMIN — aluminum-magnesium hydroxide (MAALOX) oral suspension 30 mL: ORAL | @ 01:00:00 | NDC 00121176030

## 2013-06-27 MED ADMIN — acetaminophen (TYLENOL) tablet 650 mg: ORAL | @ 10:00:00 | NDC 50580048790

## 2013-06-27 MED ADMIN — senna-docusate (PERICOLACE) 8.6-50 mg per tablet 1 Tab: ORAL | @ 12:00:00 | NDC 63739043201

## 2013-06-27 MED ADMIN — oxyCODONE IR (ROXICODONE) tablet 15 mg: ORAL | @ 08:00:00 | NDC 68084035411

## 2013-06-27 MED ADMIN — chlorthalidone (HYGROTEN) tablet 25 mg: ORAL | @ 12:00:00 | NDC 00378022201

## 2013-06-27 MED ADMIN — cefdinir (OMNICEF) capsule 300 mg: ORAL | @ 01:00:00 | NDC 68001015006

## 2013-06-27 MED ADMIN — venlafaxine-SR (EFFEXOR-XR) capsule 150 mg: ORAL | @ 12:00:00 | NDC 68382003616

## 2013-06-27 MED ADMIN — pantoprazole (PROTONIX) tablet 40 mg: ORAL | @ 20:00:00 | NDC 00008060701

## 2013-06-27 MED ADMIN — gabapentin (NEURONTIN) capsule 300 mg: ORAL | @ 01:00:00 | NDC 53746010205

## 2013-06-27 MED ADMIN — prazosin (MINIPRESS) capsule 2 mg: ORAL | @ 01:00:00 | NDC 00093406701

## 2013-06-27 MED ADMIN — buPROPion XL (WELLBUTRIN XL) tablet 150 mg: ORAL | @ 10:00:00 | NDC 10370010150

## 2013-06-27 MED ADMIN — ferrous sulfate tablet 325 mg: ORAL | @ 12:00:00

## 2013-06-27 MED ADMIN — prazosin (MINIPRESS) capsule 2 mg: ORAL | @ 10:00:00 | NDC 00378110101

## 2013-06-27 MED ADMIN — oxyCODONE IR (ROXICODONE) tablet 15 mg: ORAL | @ 16:00:00 | NDC 68084035411

## 2013-06-27 MED ADMIN — potassium chloride SR (KLOR-CON 10) tablet 20 mEq: ORAL | @ 12:00:00 | NDC 00074780419

## 2013-06-27 MED ADMIN — ondansetron (ZOFRAN ODT) tablet 4 mg: ORAL | @ 14:00:00 | NDC 68001024616

## 2013-06-27 MED FILL — PRISTIQ 50 MG TABLET,EXTENDED RELEASE: 50 mg | ORAL | Qty: 2

## 2013-06-27 MED FILL — OXYCODONE 5 MG TAB: 5 mg | ORAL | Qty: 3

## 2013-06-27 MED FILL — GABAPENTIN 300 MG CAP: 300 mg | ORAL | Qty: 1

## 2013-06-27 MED FILL — K-TAB 10 MEQ TABLET,EXTENDED RELEASE: 10 mEq | ORAL | Qty: 2

## 2013-06-27 MED FILL — PRAZOSIN 1 MG CAP: 1 mg | ORAL | Qty: 2

## 2013-06-27 MED FILL — ACETAMINOPHEN 325 MG TABLET: 325 mg | ORAL | Qty: 2

## 2013-06-27 MED FILL — ZOLPIDEM 5 MG TAB: 5 mg | ORAL | Qty: 1

## 2013-06-27 MED FILL — MAG-AL 200 MG-200 MG/5 ML ORAL SUSPENSION: 200-200 mg/5 mL | ORAL | Qty: 30

## 2013-06-27 MED FILL — SENNA PLUS 8.6 MG-50 MG TABLET: ORAL | Qty: 1

## 2013-06-27 MED FILL — PANTOPRAZOLE 40 MG TAB, DELAYED RELEASE: 40 mg | ORAL | Qty: 1

## 2013-06-27 MED FILL — CEFDINIR 300 MG CAP: 300 mg | ORAL | Qty: 1

## 2013-06-27 MED FILL — BUPROPION XL 150 MG 24 HR TAB: 150 mg | ORAL | Qty: 1

## 2013-06-27 MED FILL — DESIPRAMINE 25 MG TAB: 25 mg | ORAL | Qty: 2

## 2013-06-27 MED FILL — ONE DAILY MULTIVITAMINS WITH MINERALS 4.5 MG IRON TABLET: 4.5 mg iron | ORAL | Qty: 1

## 2013-06-27 MED FILL — FLORA-Q 8 BILLION CELL CAP: 8 billion cell | ORAL | Qty: 1

## 2013-06-27 MED FILL — VENLAFAXINE SR 150 MG 24 HR CAP: 150 mg | ORAL | Qty: 1

## 2013-06-27 MED FILL — ATENOLOL 50 MG TAB: 50 mg | ORAL | Qty: 2

## 2013-06-27 MED FILL — K-TAB 10 MEQ TABLET,EXTENDED RELEASE: 10 mEq | ORAL | Qty: 4

## 2013-06-27 MED FILL — FERROUS SULFATE 325 MG (65 MG ELEMENTAL IRON) TAB: 325 mg (65 mg iron) | ORAL | Qty: 1

## 2013-06-27 MED FILL — AMLODIPINE 5 MG TAB: 5 mg | ORAL | Qty: 1

## 2013-06-27 MED FILL — ONDANSETRON 4 MG TAB, RAPID DISSOLVE: 4 mg | ORAL | Qty: 1

## 2013-06-27 MED FILL — CHLORTHALIDONE 25 MG TAB: 25 mg | ORAL | Qty: 1

## 2013-06-28 LAB — CULTURE, URINE
Colonies Counted: >100000
Colony Count: >100000

## 2013-06-28 MED ADMIN — oxyCODONE IR (ROXICODONE) tablet 15 mg: ORAL | @ 12:00:00 | NDC 68084035411

## 2013-06-28 MED ADMIN — prazosin (MINIPRESS) capsule 2 mg: ORAL | @ 10:00:00 | NDC 00378110101

## 2013-06-28 MED ADMIN — ondansetron (ZOFRAN ODT) tablet 4 mg: ORAL | @ 02:00:00 | NDC 68001024616

## 2013-06-28 MED ADMIN — gabapentin (NEURONTIN) capsule 300 mg: ORAL | @ 01:00:00 | NDC 53746010205

## 2013-06-28 MED ADMIN — oxyCODONE IR (ROXICODONE) tablet 15 mg: ORAL | @ 22:00:00 | NDC 68084035411

## 2013-06-28 MED ADMIN — oxyCODONE IR (ROXICODONE) tablet 15 mg: ORAL | @ 19:00:00 | NDC 68084035411

## 2013-06-28 MED ADMIN — venlafaxine-SR (EFFEXOR-XR) capsule 150 mg: ORAL | @ 12:00:00 | NDC 68382003616

## 2013-06-28 MED ADMIN — oxyCODONE IR (ROXICODONE) tablet 15 mg: ORAL | @ 01:00:00 | NDC 68084035411

## 2013-06-28 MED ADMIN — gabapentin (NEURONTIN) capsule 300 mg: ORAL | @ 17:00:00 | NDC 53746010205

## 2013-06-28 MED ADMIN — buPROPion XL (WELLBUTRIN XL) tablet 150 mg: ORAL | @ 10:00:00 | NDC 00115681110

## 2013-06-28 MED ADMIN — acetaminophen (TYLENOL) tablet 650 mg: ORAL | @ 22:00:00 | NDC 50580048790

## 2013-06-28 MED ADMIN — prazosin (MINIPRESS) capsule 2 mg: ORAL | @ 01:00:00 | NDC 00378110101

## 2013-06-28 MED ADMIN — acetaminophen (TYLENOL) tablet 650 mg: ORAL | @ 10:00:00 | NDC 50580048790

## 2013-06-28 MED ADMIN — aluminum-magnesium hydroxide (MAALOX) oral suspension 30 mL: ORAL | @ 02:00:00 | NDC 00121176030

## 2013-06-28 MED ADMIN — cefdinir (OMNICEF) capsule 300 mg: ORAL | @ 01:00:00 | NDC 68001015006

## 2013-06-28 MED ADMIN — latanoprost (XALATAN) 0.005 % ophthalmic solution 1 Drop: OPHTHALMIC | @ 01:00:00 | NDC 00013830301

## 2013-06-28 MED ADMIN — ferrous sulfate tablet 325 mg: ORAL | @ 22:00:00

## 2013-06-28 MED ADMIN — pantoprazole (PROTONIX) tablet 40 mg: ORAL | @ 22:00:00 | NDC 00008060701

## 2013-06-28 MED ADMIN — multivitamin with iron tablet 1 Tab: ORAL | @ 12:00:00 | NDC 54838000980

## 2013-06-28 MED ADMIN — ferrous sulfate tablet 325 mg: ORAL | @ 12:00:00

## 2013-06-28 MED ADMIN — acetaminophen (TYLENOL) tablet 650 mg: ORAL | @ 03:00:00 | NDC 50580048790

## 2013-06-28 MED ADMIN — oxyCODONE IR (ROXICODONE) tablet 15 mg: ORAL | @ 15:00:00 | NDC 68084035411

## 2013-06-28 MED ADMIN — atenolol (TENORMIN) tablet 100 mg: ORAL | @ 01:00:00 | NDC 50742010201

## 2013-06-28 MED ADMIN — senna-docusate (PERICOLACE) 8.6-50 mg per tablet 1 Tab: ORAL | @ 12:00:00 | NDC 63739043201

## 2013-06-28 MED ADMIN — timolol (TIMOPTIC) 0.5 % ophthalmic solution 1 Drop: OPHTHALMIC | @ 12:00:00 | NDC 61314022705

## 2013-06-28 MED ADMIN — senna-docusate (PERICOLACE) 8.6-50 mg per tablet 1 Tab: ORAL | @ 01:00:00 | NDC 63739043201

## 2013-06-28 MED ADMIN — lactobac ac& pc-s.therm-b.anim (FLORA Q): ORAL | @ 01:00:00 | NDC 31257056030

## 2013-06-28 MED ADMIN — pantoprazole (PROTONIX) tablet 40 mg: ORAL | @ 10:00:00 | NDC 00008060701

## 2013-06-28 MED ADMIN — prazosin (MINIPRESS) capsule 2 mg: ORAL | @ 17:00:00 | NDC 00378110101

## 2013-06-28 MED ADMIN — Desvenlafaxine SR (PRISTIQ) tablet 100 mg: ORAL | @ 12:00:00 | NDC 63304019130

## 2013-06-28 MED ADMIN — magic mouthwash cpd (with sucralfate): ORAL | @ 23:00:00 | NDC 99999334310

## 2013-06-28 MED ADMIN — potassium chloride SR (KLOR-CON 10) tablet 20 mEq: ORAL | @ 12:00:00 | NDC 00074780419

## 2013-06-28 MED ADMIN — desipramine (NORPRAMIN) tablet 50 mg: ORAL | @ 01:00:00 | NDC 00781197201

## 2013-06-28 MED ADMIN — zolpidem (AMBIEN) tablet 5 mg: ORAL | @ 01:00:00 | NDC 68084018911

## 2013-06-28 MED ADMIN — acetaminophen (TYLENOL) tablet 650 mg: ORAL | @ 15:00:00 | NDC 50580048790

## 2013-06-28 MED ADMIN — gabapentin (NEURONTIN) capsule 300 mg: ORAL | @ 10:00:00 | NDC 53746010205

## 2013-06-28 MED ADMIN — chlorthalidone (HYGROTEN) tablet 25 mg: ORAL | @ 12:00:00 | NDC 00378022201

## 2013-06-28 MED ADMIN — amLODIPine (NORVASC) tablet 5 mg: ORAL | @ 12:00:00 | NDC 59762153002

## 2013-06-28 MED ADMIN — cefdinir (OMNICEF) capsule 300 mg: ORAL | @ 12:00:00 | NDC 68001015006

## 2013-06-28 MED FILL — FERROUS SULFATE 325 MG (65 MG ELEMENTAL IRON) TAB: 325 mg (65 mg iron) | ORAL | Qty: 1

## 2013-06-28 MED FILL — ACETAMINOPHEN 325 MG TABLET: 325 mg | ORAL | Qty: 2

## 2013-06-28 MED FILL — CHLORTHALIDONE 25 MG TAB: 25 mg | ORAL | Qty: 1

## 2013-06-28 MED FILL — FLORA-Q 8 BILLION CELL CAP: 8 billion cell | ORAL | Qty: 1

## 2013-06-28 MED FILL — GABAPENTIN 300 MG CAP: 300 mg | ORAL | Qty: 1

## 2013-06-28 MED FILL — AMLODIPINE 5 MG TAB: 5 mg | ORAL | Qty: 1

## 2013-06-28 MED FILL — PANTOPRAZOLE 40 MG TAB, DELAYED RELEASE: 40 mg | ORAL | Qty: 1

## 2013-06-28 MED FILL — DESIPRAMINE 25 MG TAB: 25 mg | ORAL | Qty: 2

## 2013-06-28 MED FILL — OXYCODONE 5 MG TAB: 5 mg | ORAL | Qty: 3

## 2013-06-28 MED FILL — ONE DAILY MULTIVITAMINS WITH MINERALS 4.5 MG IRON TABLET: 4.5 mg iron | ORAL | Qty: 1

## 2013-06-28 MED FILL — ZOLPIDEM 5 MG TAB: 5 mg | ORAL | Qty: 1

## 2013-06-28 MED FILL — MAG-AL 200 MG-200 MG/5 ML ORAL SUSPENSION: 200-200 mg/5 mL | ORAL | Qty: 30

## 2013-06-28 MED FILL — CEFDINIR 300 MG CAP: 300 mg | ORAL | Qty: 1

## 2013-06-28 MED FILL — SENNA PLUS 8.6 MG-50 MG TABLET: ORAL | Qty: 1

## 2013-06-28 MED FILL — VENLAFAXINE SR 150 MG 24 HR CAP: 150 mg | ORAL | Qty: 1

## 2013-06-28 MED FILL — PRISTIQ 50 MG TABLET,EXTENDED RELEASE: 50 mg | ORAL | Qty: 2

## 2013-06-28 MED FILL — ONDANSETRON 4 MG TAB, RAPID DISSOLVE: 4 mg | ORAL | Qty: 1

## 2013-06-28 MED FILL — K-TAB 10 MEQ TABLET,EXTENDED RELEASE: 10 mEq | ORAL | Qty: 2

## 2013-06-28 MED FILL — PRAZOSIN 1 MG CAP: 1 mg | ORAL | Qty: 2

## 2013-06-28 MED FILL — ATENOLOL 50 MG TAB: 50 mg | ORAL | Qty: 2

## 2013-06-28 MED FILL — TIMOLOL MALEATE 0.5 % EYE DROPS: 0.5 % | OPHTHALMIC | Qty: 5

## 2013-06-28 MED FILL — BUPROPION XL 150 MG 24 HR TAB: 150 mg | ORAL | Qty: 1

## 2013-06-29 MED ADMIN — prazosin (MINIPRESS) capsule 2 mg: ORAL | @ 17:00:00 | NDC 00378110101

## 2013-06-29 MED ADMIN — acetaminophen (TYLENOL) tablet 650 mg: ORAL | @ 21:00:00 | NDC 50580048790

## 2013-06-29 MED ADMIN — potassium chloride SR (KLOR-CON 10) tablet 20 mEq: ORAL | @ 13:00:00 | NDC 00074780419

## 2013-06-29 MED ADMIN — oxyCODONE IR (ROXICODONE) tablet 10 mg: ORAL | @ 10:00:00 | NDC 68084035411

## 2013-06-29 MED ADMIN — oxyCODONE IR (ROXICODONE) tablet 10 mg: ORAL | @ 21:00:00 | NDC 68084035411

## 2013-06-29 MED ADMIN — senna-docusate (PERICOLACE) 8.6-50 mg per tablet 1 Tab: ORAL | @ 13:00:00 | NDC 63739043201

## 2013-06-29 MED ADMIN — oxyCODONE IR (ROXICODONE) tablet 15 mg: ORAL | @ 14:00:00 | NDC 68084035411

## 2013-06-29 MED ADMIN — atenolol (TENORMIN) tablet 100 mg: ORAL | @ 02:00:00 | NDC 50742010201

## 2013-06-29 MED ADMIN — cefdinir (OMNICEF) capsule 300 mg: ORAL | @ 13:00:00 | NDC 68001015006

## 2013-06-29 MED ADMIN — magic mouthwash cpd (with sucralfate): ORAL | @ 21:00:00 | NDC 99999334310

## 2013-06-29 MED ADMIN — oxyCODONE IR (ROXICODONE) tablet 10 mg: ORAL | @ 02:00:00 | NDC 68084035411

## 2013-06-29 MED ADMIN — oxyCODONE IR (ROXICODONE) tablet 15 mg: ORAL | @ 17:00:00 | NDC 68084035411

## 2013-06-29 MED ADMIN — bisacodyl (DULCOLAX) suppository 10 mg: RECTAL | @ 22:00:00 | NDC 00713010906

## 2013-06-29 MED ADMIN — gabapentin (NEURONTIN) capsule 300 mg: ORAL | @ 10:00:00 | NDC 53746010205

## 2013-06-29 MED ADMIN — Desvenlafaxine SR (PRISTIQ) tablet 100 mg: ORAL | @ 13:00:00 | NDC 63304019130

## 2013-06-29 MED ADMIN — senna-docusate (PERICOLACE) 8.6-50 mg per tablet 1 Tab: ORAL | @ 02:00:00 | NDC 63739043201

## 2013-06-29 MED ADMIN — magic mouthwash cpd (with sucralfate): ORAL | @ 16:00:00 | NDC 99999334310

## 2013-06-29 MED ADMIN — timolol (TIMOPTIC) 0.5 % ophthalmic solution 1 Drop: OPHTHALMIC | @ 13:00:00 | NDC 61314022705

## 2013-06-29 MED ADMIN — ferrous sulfate tablet 325 mg: ORAL | @ 13:00:00

## 2013-06-29 MED ADMIN — acetaminophen (TYLENOL) tablet 650 mg: ORAL | @ 10:00:00 | NDC 50580048790

## 2013-06-29 MED ADMIN — zolpidem (AMBIEN) tablet 5 mg: ORAL | @ 02:00:00 | NDC 00904608261

## 2013-06-29 MED ADMIN — prazosin (MINIPRESS) capsule 2 mg: ORAL | @ 10:00:00 | NDC 00378110101

## 2013-06-29 MED ADMIN — pantoprazole (PROTONIX) tablet 40 mg: ORAL | @ 21:00:00 | NDC 00008060701

## 2013-06-29 MED ADMIN — prazosin (MINIPRESS) capsule 2 mg: ORAL | @ 02:00:00 | NDC 00378110101

## 2013-06-29 MED ADMIN — cefdinir (OMNICEF) capsule 300 mg: ORAL | @ 02:00:00 | NDC 68001015006

## 2013-06-29 MED ADMIN — latanoprost (XALATAN) 0.005 % ophthalmic solution 1 Drop: OPHTHALMIC | @ 02:00:00 | NDC 00013830301

## 2013-06-29 MED ADMIN — chlorthalidone (HYGROTEN) tablet 25 mg: ORAL | @ 13:00:00 | NDC 00378022201

## 2013-06-29 MED ADMIN — gabapentin (NEURONTIN) capsule 300 mg: ORAL | @ 02:00:00 | NDC 53746010205

## 2013-06-29 MED ADMIN — venlafaxine-SR (EFFEXOR-XR) capsule 150 mg: ORAL | @ 13:00:00 | NDC 68382003616

## 2013-06-29 MED ADMIN — lactobac ac& pc-s.therm-b.anim (FLORA Q): ORAL | @ 02:00:00 | NDC 31257056030

## 2013-06-29 MED ADMIN — ferrous sulfate tablet 325 mg: ORAL | @ 21:00:00

## 2013-06-29 MED ADMIN — desipramine (NORPRAMIN) tablet 50 mg: ORAL | @ 02:00:00 | NDC 00781197201

## 2013-06-29 MED ADMIN — acetaminophen (TYLENOL) tablet 650 mg: ORAL | @ 16:00:00 | NDC 50580048790

## 2013-06-29 MED ADMIN — multivitamin with iron tablet 1 Tab: ORAL | @ 13:00:00 | NDC 54838000980

## 2013-06-29 MED ADMIN — sorbitol 70 % solution 30 mL: ORAL | @ 10:00:00 | NDC 62991277803

## 2013-06-29 MED ADMIN — pantoprazole (PROTONIX) tablet 40 mg: ORAL | @ 10:00:00 | NDC 00008060701

## 2013-06-29 MED ADMIN — gabapentin (NEURONTIN) capsule 300 mg: ORAL | @ 17:00:00 | NDC 53746010205

## 2013-06-29 MED ADMIN — magic mouthwash cpd (with sucralfate): ORAL | @ 13:00:00 | NDC 99999334310

## 2013-06-29 MED ADMIN — acetaminophen (TYLENOL) tablet 650 mg: ORAL | @ 03:00:00 | NDC 50580048790

## 2013-06-29 MED ADMIN — buPROPion XL (WELLBUTRIN XL) tablet 150 mg: ORAL | @ 10:00:00 | NDC 00115681110

## 2013-06-29 MED ADMIN — amLODIPine (NORVASC) tablet 5 mg: ORAL | @ 13:00:00 | NDC 59762153002

## 2013-06-29 MED FILL — SENNA PLUS 8.6 MG-50 MG TABLET: ORAL | Qty: 1

## 2013-06-29 MED FILL — OXYCODONE 5 MG TAB: 5 mg | ORAL | Qty: 2

## 2013-06-29 MED FILL — ACETAMINOPHEN 325 MG TABLET: 325 mg | ORAL | Qty: 2

## 2013-06-29 MED FILL — ATENOLOL 50 MG TAB: 50 mg | ORAL | Qty: 2

## 2013-06-29 MED FILL — K-TAB 10 MEQ TABLET,EXTENDED RELEASE: 10 mEq | ORAL | Qty: 2

## 2013-06-29 MED FILL — CHLORTHALIDONE 25 MG TAB: 25 mg | ORAL | Qty: 1

## 2013-06-29 MED FILL — PRAZOSIN 1 MG CAP: 1 mg | ORAL | Qty: 2

## 2013-06-29 MED FILL — SORBITOL 70 % SOLN: 70 % | Qty: 30

## 2013-06-29 MED FILL — ZOLPIDEM 5 MG TAB: 5 mg | ORAL | Qty: 1

## 2013-06-29 MED FILL — VENLAFAXINE SR 150 MG 24 HR CAP: 150 mg | ORAL | Qty: 1

## 2013-06-29 MED FILL — OXYCODONE 5 MG TAB: 5 mg | ORAL | Qty: 3

## 2013-06-29 MED FILL — PANTOPRAZOLE 40 MG TAB, DELAYED RELEASE: 40 mg | ORAL | Qty: 1

## 2013-06-29 MED FILL — CEFDINIR 300 MG CAP: 300 mg | ORAL | Qty: 1

## 2013-06-29 MED FILL — ONE DAILY MULTIVITAMINS WITH MINERALS 4.5 MG IRON TABLET: 4.5 mg iron | ORAL | Qty: 1

## 2013-06-29 MED FILL — DESIPRAMINE 25 MG TAB: 25 mg | ORAL | Qty: 2

## 2013-06-29 MED FILL — FERROUS SULFATE 325 MG (65 MG ELEMENTAL IRON) TAB: 325 mg (65 mg iron) | ORAL | Qty: 1

## 2013-06-29 MED FILL — AMLODIPINE 5 MG TAB: 5 mg | ORAL | Qty: 1

## 2013-06-29 MED FILL — GABAPENTIN 300 MG CAP: 300 mg | ORAL | Qty: 1

## 2013-06-29 MED FILL — BUPROPION XL 150 MG 24 HR TAB: 150 mg | ORAL | Qty: 1

## 2013-06-29 MED FILL — FLORA-Q 8 BILLION CELL CAP: 8 billion cell | ORAL | Qty: 1

## 2013-06-29 MED FILL — PRISTIQ 50 MG TABLET,EXTENDED RELEASE: 50 mg | ORAL | Qty: 2

## 2013-06-29 MED FILL — BISAC-EVAC 10 MG RECTAL SUPPOSITORY: 10 mg | RECTAL | Qty: 1

## 2013-06-30 LAB — POTASSIUM: Potassium: 3.5 mmol/L (ref 3.5–5.1)

## 2013-06-30 LAB — MAGNESIUM: Magnesium: 1.8 mg/dL (ref 1.6–2.4)

## 2013-06-30 MED ADMIN — buPROPion XL (WELLBUTRIN XL) tablet 150 mg: ORAL | @ 13:00:00 | NDC 00115681110

## 2013-06-30 MED ADMIN — pantoprazole (PROTONIX) tablet 40 mg: ORAL | @ 21:00:00 | NDC 00008060701

## 2013-06-30 MED ADMIN — ferrous sulfate tablet 325 mg: ORAL | @ 13:00:00

## 2013-06-30 MED ADMIN — prazosin (MINIPRESS) capsule 2 mg: ORAL | @ 02:00:00 | NDC 00378110101

## 2013-06-30 MED ADMIN — cefdinir (OMNICEF) capsule 300 mg: ORAL | @ 13:00:00 | NDC 68001015006

## 2013-06-30 MED ADMIN — gabapentin (NEURONTIN) capsule 300 mg: ORAL | @ 16:00:00 | NDC 53746010205

## 2013-06-30 MED ADMIN — clindamycin (CLEOCIN) capsule 450 mg: ORAL | @ 16:00:00 | NDC 00591570801

## 2013-06-30 MED ADMIN — senna-docusate (PERICOLACE) 8.6-50 mg per tablet 1 Tab: ORAL | @ 13:00:00 | NDC 63739043201

## 2013-06-30 MED ADMIN — magic mouthwash cpd (with sucralfate): ORAL | @ 16:00:00 | NDC 99999334310

## 2013-06-30 MED ADMIN — acetaminophen (TYLENOL) tablet 650 mg: ORAL | @ 04:00:00 | NDC 50580048790

## 2013-06-30 MED ADMIN — ferrous sulfate tablet 325 mg: ORAL | @ 21:00:00

## 2013-06-30 MED ADMIN — desipramine (NORPRAMIN) tablet 50 mg: ORAL | @ 02:00:00 | NDC 00781197201

## 2013-06-30 MED ADMIN — prazosin (MINIPRESS) capsule 2 mg: ORAL | @ 19:00:00 | NDC 00378110101

## 2013-06-30 MED ADMIN — potassium chloride SR (KLOR-CON 10) tablet 10 mEq: ORAL | @ 16:00:00 | NDC 00074780419

## 2013-06-30 MED ADMIN — timolol (TIMOPTIC) 0.5 % ophthalmic solution 1 Drop: OPHTHALMIC | @ 13:00:00 | NDC 61314022705

## 2013-06-30 MED ADMIN — gabapentin (NEURONTIN) capsule 600 mg: ORAL | @ 02:00:00 | NDC 53746010205

## 2013-06-30 MED ADMIN — gabapentin (NEURONTIN) capsule 300 mg: ORAL | @ 13:00:00 | NDC 53746010205

## 2013-06-30 MED ADMIN — acetaminophen (TYLENOL) tablet 650 mg: ORAL | @ 16:00:00 | NDC 50580048790

## 2013-06-30 MED ADMIN — chlorthalidone (HYGROTEN) tablet 25 mg: ORAL | @ 13:00:00 | NDC 00378022201

## 2013-06-30 MED ADMIN — clindamycin (CLEOCIN) capsule 450 mg: ORAL | @ 21:00:00 | NDC 00591570801

## 2013-06-30 MED ADMIN — oxyCODONE IR (ROXICODONE) tablet 15 mg: ORAL | @ 16:00:00 | NDC 68084035411

## 2013-06-30 MED ADMIN — latanoprost (XALATAN) 0.005 % ophthalmic solution 1 Drop: OPHTHALMIC | @ 02:00:00 | NDC 00013830301

## 2013-06-30 MED ADMIN — amLODIPine (NORVASC) tablet 5 mg: ORAL | @ 13:00:00 | NDC 59762153002

## 2013-06-30 MED ADMIN — prazosin (MINIPRESS) capsule 2 mg: ORAL | @ 09:00:00 | NDC 00378110101

## 2013-06-30 MED ADMIN — acetaminophen (TYLENOL) tablet 650 mg: ORAL | @ 09:00:00 | NDC 50580048790

## 2013-06-30 MED ADMIN — oxyCODONE IR (ROXICODONE) tablet 15 mg: ORAL | @ 21:00:00 | NDC 68084035411

## 2013-06-30 MED ADMIN — potassium chloride SR (KLOR-CON 10) tablet 20 mEq: ORAL | @ 13:00:00 | NDC 00074780419

## 2013-06-30 MED ADMIN — magic mouthwash cpd (with sucralfate): ORAL | @ 21:00:00 | NDC 99999334310

## 2013-06-30 MED ADMIN — potassium chloride SR (KLOR-CON 10) tablet 10 mEq: ORAL | @ 21:00:00 | NDC 00074780419

## 2013-06-30 MED ADMIN — oxyCODONE IR (ROXICODONE) tablet 15 mg: ORAL | @ 13:00:00 | NDC 68084035411

## 2013-06-30 MED ADMIN — oxyCODONE IR (ROXICODONE) tablet 15 mg: ORAL | @ 09:00:00 | NDC 68084035411

## 2013-06-30 MED ADMIN — senna-docusate (PERICOLACE) 8.6-50 mg per tablet 1 Tab: ORAL | @ 02:00:00 | NDC 63739043201

## 2013-06-30 MED ADMIN — acetaminophen (TYLENOL) tablet 650 mg: ORAL | @ 21:00:00 | NDC 50580048790

## 2013-06-30 MED ADMIN — cefdinir (OMNICEF) capsule 300 mg: ORAL | @ 02:00:00 | NDC 68001015006

## 2013-06-30 MED ADMIN — lactobac ac& pc-s.therm-b.anim (FLORA Q): ORAL | @ 16:00:00 | NDC 31257056030

## 2013-06-30 MED ADMIN — atenolol (TENORMIN) tablet 100 mg: ORAL | @ 02:00:00 | NDC 50742010201

## 2013-06-30 MED ADMIN — oxyCODONE IR (ROXICODONE) tablet 10 mg: ORAL | @ 02:00:00 | NDC 68084035411

## 2013-06-30 MED ADMIN — venlafaxine-SR (EFFEXOR-XR) capsule 150 mg: ORAL | @ 13:00:00 | NDC 68382003616

## 2013-06-30 MED ADMIN — zolpidem (AMBIEN) tablet 5 mg: ORAL | @ 02:00:00 | NDC 00904608261

## 2013-06-30 MED ADMIN — lactobac ac& pc-s.therm-b.anim (FLORA Q): ORAL | @ 02:00:00 | NDC 31257056030

## 2013-06-30 MED ADMIN — pantoprazole (PROTONIX) tablet 40 mg: ORAL | @ 09:00:00 | NDC 00008060701

## 2013-06-30 MED ADMIN — Desvenlafaxine SR (PRISTIQ) tablet 100 mg: ORAL | @ 13:00:00 | NDC 63304019130

## 2013-06-30 MED ADMIN — multivitamin with iron tablet 1 Tab: ORAL | @ 13:00:00 | NDC 54838000980

## 2013-06-30 MED FILL — K-TAB 10 MEQ TABLET,EXTENDED RELEASE: 10 mEq | ORAL | Qty: 1

## 2013-06-30 MED FILL — OXYCODONE 5 MG TAB: 5 mg | ORAL | Qty: 3

## 2013-06-30 MED FILL — CLINDAMYCIN 150 MG CAP: 150 mg | ORAL | Qty: 3

## 2013-06-30 MED FILL — PANTOPRAZOLE 40 MG TAB, DELAYED RELEASE: 40 mg | ORAL | Qty: 1

## 2013-06-30 MED FILL — PRAZOSIN 1 MG CAP: 1 mg | ORAL | Qty: 2

## 2013-06-30 MED FILL — FERROUS SULFATE 325 MG (65 MG ELEMENTAL IRON) TAB: 325 mg (65 mg iron) | ORAL | Qty: 1

## 2013-06-30 MED FILL — SENNA PLUS 8.6 MG-50 MG TABLET: ORAL | Qty: 1

## 2013-06-30 MED FILL — ZOLPIDEM 5 MG TAB: 5 mg | ORAL | Qty: 1

## 2013-06-30 MED FILL — ACETAMINOPHEN 325 MG TABLET: 325 mg | ORAL | Qty: 2

## 2013-06-30 MED FILL — FLORA-Q 8 BILLION CELL CAP: 8 billion cell | ORAL | Qty: 1

## 2013-06-30 MED FILL — PRISTIQ 50 MG TABLET,EXTENDED RELEASE: 50 mg | ORAL | Qty: 2

## 2013-06-30 MED FILL — GABAPENTIN 300 MG CAP: 300 mg | ORAL | Qty: 2

## 2013-06-30 MED FILL — CEFDINIR 300 MG CAP: 300 mg | ORAL | Qty: 1

## 2013-06-30 MED FILL — GABAPENTIN 300 MG CAP: 300 mg | ORAL | Qty: 1

## 2013-06-30 MED FILL — K-TAB 10 MEQ TABLET,EXTENDED RELEASE: 10 mEq | ORAL | Qty: 2

## 2013-06-30 MED FILL — ATENOLOL 50 MG TAB: 50 mg | ORAL | Qty: 2

## 2013-06-30 MED FILL — AMLODIPINE 5 MG TAB: 5 mg | ORAL | Qty: 1

## 2013-06-30 MED FILL — BUPROPION XL 150 MG 24 HR TAB: 150 mg | ORAL | Qty: 1

## 2013-06-30 MED FILL — VENLAFAXINE SR 150 MG 24 HR CAP: 150 mg | ORAL | Qty: 1

## 2013-06-30 MED FILL — CHLORTHALIDONE 25 MG TAB: 25 mg | ORAL | Qty: 1

## 2013-06-30 MED FILL — ONE DAILY MULTIVITAMINS WITH MINERALS 4.5 MG IRON TABLET: 4.5 mg iron | ORAL | Qty: 1

## 2013-06-30 MED FILL — DESIPRAMINE 25 MG TAB: 25 mg | ORAL | Qty: 2

## 2013-06-30 MED FILL — OXYCODONE 5 MG TAB: 5 mg | ORAL | Qty: 2

## 2013-07-01 MED ADMIN — clindamycin (CLEOCIN) capsule 450 mg: ORAL | @ 03:00:00 | NDC 00591570801

## 2013-07-01 MED ADMIN — prazosin (MINIPRESS) capsule 2 mg: ORAL | @ 09:00:00 | NDC 00378110101

## 2013-07-01 MED ADMIN — Desvenlafaxine SR (PRISTIQ) tablet 100 mg: ORAL | @ 12:00:00 | NDC 63304019130

## 2013-07-01 MED ADMIN — latanoprost (XALATAN) 0.005 % ophthalmic solution 1 Drop: OPHTHALMIC | @ 02:00:00 | NDC 00013830301

## 2013-07-01 MED ADMIN — potassium chloride SR (KLOR-CON 10) tablet 10 mEq: ORAL | @ 12:00:00 | NDC 00074780419

## 2013-07-01 MED ADMIN — buPROPion XL (WELLBUTRIN XL) tablet 150 mg: ORAL | @ 12:00:00 | NDC 00115681110

## 2013-07-01 MED ADMIN — lactobac ac& pc-s.therm-b.anim (FLORA Q): ORAL | @ 12:00:00 | NDC 31257056030

## 2013-07-01 MED ADMIN — pantoprazole (PROTONIX) tablet 40 mg: ORAL | @ 09:00:00 | NDC 00008060701

## 2013-07-01 MED ADMIN — zolpidem (AMBIEN) tablet 5 mg: ORAL | @ 02:00:00 | NDC 00904608261

## 2013-07-01 MED ADMIN — gabapentin (NEURONTIN) capsule 600 mg: ORAL | @ 02:00:00 | NDC 53746010205

## 2013-07-01 MED ADMIN — magic mouthwash cpd (with sucralfate): ORAL | @ 16:00:00 | NDC 99999334310

## 2013-07-01 MED ADMIN — clindamycin (CLEOCIN) capsule 450 mg: ORAL | @ 21:00:00 | NDC 00591570801

## 2013-07-01 MED ADMIN — pantoprazole (PROTONIX) tablet 40 mg: ORAL | @ 20:00:00 | NDC 00008060701

## 2013-07-01 MED ADMIN — acetaminophen (TYLENOL) tablet 650 mg: ORAL | @ 21:00:00 | NDC 50580048790

## 2013-07-01 MED ADMIN — acetaminophen (TYLENOL) tablet 650 mg: ORAL | @ 16:00:00 | NDC 50580048790

## 2013-07-01 MED ADMIN — cefdinir (OMNICEF) capsule 300 mg: ORAL | @ 12:00:00 | NDC 68001015006

## 2013-07-01 MED ADMIN — prazosin (MINIPRESS) capsule 2 mg: ORAL | @ 18:00:00 | NDC 00378110101

## 2013-07-01 MED ADMIN — ferrous sulfate tablet 325 mg: ORAL | @ 12:00:00

## 2013-07-01 MED ADMIN — magic mouthwash cpd (with sucralfate): ORAL | @ 20:00:00 | NDC 99999334310

## 2013-07-01 MED ADMIN — oxyCODONE IR (ROXICODONE) tablet 10 mg: ORAL | @ 20:00:00 | NDC 68084035411

## 2013-07-01 MED ADMIN — lactobac ac& pc-s.therm-b.anim (FLORA Q): ORAL | @ 02:00:00 | NDC 31257056030

## 2013-07-01 MED ADMIN — atenolol (TENORMIN) tablet 100 mg: ORAL | @ 02:00:00 | NDC 50742010201

## 2013-07-01 MED ADMIN — potassium chloride SR (KLOR-CON 10) tablet 10 mEq: ORAL | @ 21:00:00 | NDC 00074780419

## 2013-07-01 MED ADMIN — prazosin (MINIPRESS) capsule 2 mg: ORAL | @ 02:00:00 | NDC 00378110101

## 2013-07-01 MED ADMIN — acetaminophen (TYLENOL) tablet 650 mg: ORAL | @ 09:00:00 | NDC 50580048790

## 2013-07-01 MED ADMIN — acetaminophen (TYLENOL) tablet 650 mg: ORAL | @ 03:00:00 | NDC 50580048790

## 2013-07-01 MED ADMIN — venlafaxine-SR (EFFEXOR-XR) capsule 150 mg: ORAL | @ 12:00:00 | NDC 68382003616

## 2013-07-01 MED ADMIN — ondansetron (ZOFRAN ODT) tablet 4 mg: ORAL | @ 18:00:00 | NDC 68001024616

## 2013-07-01 MED ADMIN — oxyCODONE IR (ROXICODONE) tablet 15 mg: ORAL | @ 09:00:00 | NDC 68084035411

## 2013-07-01 MED ADMIN — potassium chloride SR (KLOR-CON 10) tablet 10 mEq: ORAL | @ 16:00:00 | NDC 00074780419

## 2013-07-01 MED ADMIN — cefdinir (OMNICEF) capsule 300 mg: ORAL | @ 02:00:00 | NDC 68001015006

## 2013-07-01 MED ADMIN — timolol (TIMOPTIC) 0.5 % ophthalmic solution 1 Drop: OPHTHALMIC | @ 12:00:00 | NDC 61314022705

## 2013-07-01 MED ADMIN — amLODIPine (NORVASC) tablet 5 mg: ORAL | @ 12:00:00 | NDC 59762153002

## 2013-07-01 MED ADMIN — gabapentin (NEURONTIN) capsule 300 mg: ORAL | @ 16:00:00 | NDC 53746010205

## 2013-07-01 MED ADMIN — oxyCODONE IR (ROXICODONE) tablet 15 mg: ORAL | @ 02:00:00 | NDC 68084035411

## 2013-07-01 MED ADMIN — senna-docusate (PERICOLACE) 8.6-50 mg per tablet 1 Tab: ORAL | @ 02:00:00 | NDC 63739043201

## 2013-07-01 MED ADMIN — magic mouthwash cpd (with sucralfate): ORAL | @ 12:00:00 | NDC 99999334310

## 2013-07-01 MED ADMIN — oxyCODONE IR (ROXICODONE) tablet 10 mg: ORAL | @ 16:00:00 | NDC 68084035411

## 2013-07-01 MED ADMIN — multivitamin with iron tablet 1 Tab: ORAL | @ 12:00:00 | NDC 54838000980

## 2013-07-01 MED ADMIN — clindamycin (CLEOCIN) capsule 450 mg: ORAL | @ 16:00:00 | NDC 00591570801

## 2013-07-01 MED ADMIN — desipramine (NORPRAMIN) tablet 50 mg: ORAL | @ 02:00:00 | NDC 00781197201

## 2013-07-01 MED ADMIN — senna-docusate (PERICOLACE) 8.6-50 mg per tablet 1 Tab: ORAL | @ 12:00:00 | NDC 63739043201

## 2013-07-01 MED ADMIN — gabapentin (NEURONTIN) capsule 300 mg: ORAL | @ 12:00:00 | NDC 53746010205

## 2013-07-01 MED ADMIN — ferrous sulfate tablet 325 mg: ORAL | @ 21:00:00

## 2013-07-01 MED ADMIN — clindamycin (CLEOCIN) capsule 450 mg: ORAL | @ 09:00:00 | NDC 00591570801

## 2013-07-01 MED ADMIN — chlorthalidone (HYGROTEN) tablet 25 mg: ORAL | @ 12:00:00 | NDC 00378022201

## 2013-07-01 MED ADMIN — oxyCODONE IR (ROXICODONE) tablet 10 mg: ORAL | @ 12:00:00 | NDC 68084035411

## 2013-07-01 MED FILL — FLORA-Q 8 BILLION CELL CAP: 8 billion cell | ORAL | Qty: 1

## 2013-07-01 MED FILL — OXYCODONE 5 MG TAB: 5 mg | ORAL | Qty: 2

## 2013-07-01 MED FILL — ACETAMINOPHEN 325 MG TABLET: 325 mg | ORAL | Qty: 2

## 2013-07-01 MED FILL — SENNA PLUS 8.6 MG-50 MG TABLET: ORAL | Qty: 1

## 2013-07-01 MED FILL — CLINDAMYCIN 150 MG CAP: 150 mg | ORAL | Qty: 3

## 2013-07-01 MED FILL — MILK OF MAGNESIA 400 MG/5 ML ORAL SUSPENSION: 400 mg/5 mL | ORAL | Qty: 30

## 2013-07-01 MED FILL — PANTOPRAZOLE 40 MG TAB, DELAYED RELEASE: 40 mg | ORAL | Qty: 1

## 2013-07-01 MED FILL — OXYCODONE 5 MG TAB: 5 mg | ORAL | Qty: 3

## 2013-07-01 MED FILL — FERROUS SULFATE 325 MG (65 MG ELEMENTAL IRON) TAB: 325 mg (65 mg iron) | ORAL | Qty: 1

## 2013-07-01 MED FILL — GABAPENTIN 300 MG CAP: 300 mg | ORAL | Qty: 1

## 2013-07-01 MED FILL — CHLORTHALIDONE 25 MG TAB: 25 mg | ORAL | Qty: 1

## 2013-07-01 MED FILL — PRISTIQ 50 MG TABLET,EXTENDED RELEASE: 50 mg | ORAL | Qty: 2

## 2013-07-01 MED FILL — ONDANSETRON 4 MG TAB, RAPID DISSOLVE: 4 mg | ORAL | Qty: 1

## 2013-07-01 MED FILL — VENLAFAXINE SR 150 MG 24 HR CAP: 150 mg | ORAL | Qty: 1

## 2013-07-01 MED FILL — K-TAB 10 MEQ TABLET,EXTENDED RELEASE: 10 mEq | ORAL | Qty: 1

## 2013-07-01 MED FILL — BUPROPION XL 150 MG 24 HR TAB: 150 mg | ORAL | Qty: 1

## 2013-07-01 MED FILL — AMLODIPINE 5 MG TAB: 5 mg | ORAL | Qty: 1

## 2013-07-01 MED FILL — PRAZOSIN 1 MG CAP: 1 mg | ORAL | Qty: 2

## 2013-07-01 MED FILL — ONE DAILY MULTIVITAMINS WITH MINERALS 4.5 MG IRON TABLET: 4.5 mg iron | ORAL | Qty: 1

## 2013-07-01 MED FILL — ZOLPIDEM 5 MG TAB: 5 mg | ORAL | Qty: 1

## 2013-07-01 MED FILL — DESIPRAMINE 25 MG TAB: 25 mg | ORAL | Qty: 2

## 2013-07-01 MED FILL — CEFDINIR 300 MG CAP: 300 mg | ORAL | Qty: 1

## 2013-07-01 MED FILL — ATENOLOL 50 MG TAB: 50 mg | ORAL | Qty: 2

## 2013-07-01 MED FILL — GABAPENTIN 300 MG CAP: 300 mg | ORAL | Qty: 2

## 2013-07-02 MED ADMIN — senna-docusate (PERICOLACE) 8.6-50 mg per tablet 1 Tab: ORAL | @ 13:00:00 | NDC 63739043201

## 2013-07-02 MED ADMIN — cefdinir (OMNICEF) capsule 300 mg: ORAL | @ 13:00:00 | NDC 68001015006

## 2013-07-02 MED ADMIN — ondansetron (ZOFRAN ODT) tablet 4 mg: ORAL | @ 10:00:00 | NDC 68001024616

## 2013-07-02 MED ADMIN — latanoprost (XALATAN) 0.005 % ophthalmic solution 1 Drop: OPHTHALMIC | @ 02:00:00 | NDC 00013830301

## 2013-07-02 MED ADMIN — acetaminophen (TYLENOL) tablet 650 mg: ORAL | @ 15:00:00 | NDC 50580048790

## 2013-07-02 MED ADMIN — oxyCODONE IR (ROXICODONE) tablet 15 mg: ORAL | @ 11:00:00 | NDC 68084035411

## 2013-07-02 MED ADMIN — chlorthalidone (HYGROTEN) tablet 25 mg: ORAL | @ 13:00:00 | NDC 00378022201

## 2013-07-02 MED ADMIN — aluminum-magnesium hydroxide (MAALOX) oral suspension 30 mL: ORAL | @ 02:00:00 | NDC 00121176030

## 2013-07-02 MED ADMIN — multivitamin with iron tablet 1 Tab: ORAL | @ 13:00:00 | NDC 54838000980

## 2013-07-02 MED ADMIN — gabapentin (NEURONTIN) capsule 300 mg: ORAL | @ 13:00:00 | NDC 53746010205

## 2013-07-02 MED ADMIN — desipramine (NORPRAMIN) tablet 50 mg: ORAL | @ 02:00:00 | NDC 00781197201

## 2013-07-02 MED ADMIN — venlafaxine-SR (EFFEXOR-XR) capsule 150 mg: ORAL | @ 13:00:00 | NDC 68382003616

## 2013-07-02 MED ADMIN — acetaminophen (TYLENOL) tablet 650 mg: ORAL | @ 10:00:00 | NDC 50580048790

## 2013-07-02 MED ADMIN — magic mouthwash cpd (with sucralfate): ORAL | @ 15:00:00 | NDC 99999334310

## 2013-07-02 MED ADMIN — aluminum-magnesium hydroxide (MAALOX) oral suspension 30 mL: ORAL | @ 10:00:00 | NDC 00121176030

## 2013-07-02 MED ADMIN — oxyCODONE IR (ROXICODONE) tablet 15 mg: ORAL | @ 15:00:00 | NDC 68084035411

## 2013-07-02 MED ADMIN — pantoprazole (PROTONIX) tablet 40 mg: ORAL | @ 10:00:00 | NDC 00008060701

## 2013-07-02 MED ADMIN — potassium chloride SR (KLOR-CON 10) tablet 10 mEq: ORAL | @ 13:00:00 | NDC 00074780419

## 2013-07-02 MED ADMIN — clindamycin (CLEOCIN) capsule 450 mg: ORAL | @ 03:00:00 | NDC 00591570801

## 2013-07-02 MED ADMIN — zolpidem (AMBIEN) tablet 5 mg: ORAL | @ 02:00:00 | NDC 00904608261

## 2013-07-02 MED ADMIN — potassium chloride SR (KLOR-CON 10) tablet 10 mEq: ORAL | @ 15:00:00 | NDC 00074780419

## 2013-07-02 MED ADMIN — senna-docusate (PERICOLACE) 8.6-50 mg per tablet 1 Tab: ORAL | @ 02:00:00 | NDC 63739043201

## 2013-07-02 MED ADMIN — timolol (TIMOPTIC) 0.5 % ophthalmic solution 1 Drop: OPHTHALMIC | @ 13:00:00 | NDC 61314022705

## 2013-07-02 MED ADMIN — magic mouthwash cpd (with sucralfate): ORAL | @ 13:00:00 | NDC 99999334310

## 2013-07-02 MED ADMIN — cefdinir (OMNICEF) capsule 300 mg: ORAL | @ 02:00:00 | NDC 68001015006

## 2013-07-02 MED ADMIN — amLODIPine (NORVASC) tablet 5 mg: ORAL | @ 13:00:00 | NDC 59762153002

## 2013-07-02 MED ADMIN — oxyCODONE IR (ROXICODONE) tablet 15 mg: ORAL | @ 02:00:00 | NDC 68084035411

## 2013-07-02 MED ADMIN — ondansetron (ZOFRAN ODT) tablet 4 mg: ORAL | @ 02:00:00 | NDC 68001024616

## 2013-07-02 MED ADMIN — acetaminophen (TYLENOL) tablet 650 mg: ORAL | @ 03:00:00 | NDC 50580048790

## 2013-07-02 MED ADMIN — lactobac ac& pc-s.therm-b.anim (FLORA Q): ORAL | @ 02:00:00 | NDC 31257056030

## 2013-07-02 MED ADMIN — Desvenlafaxine SR (PRISTIQ) tablet 100 mg: ORAL | @ 13:00:00 | NDC 63304019130

## 2013-07-02 MED ADMIN — lactobac ac& pc-s.therm-b.anim (FLORA Q): ORAL | @ 13:00:00 | NDC 31257056030

## 2013-07-02 MED ADMIN — clindamycin (CLEOCIN) capsule 450 mg: ORAL | @ 15:00:00 | NDC 00591570801

## 2013-07-02 MED ADMIN — atenolol (TENORMIN) tablet 100 mg: ORAL | @ 02:00:00 | NDC 50742010201

## 2013-07-02 MED ADMIN — ferrous sulfate tablet 325 mg: ORAL | @ 13:00:00

## 2013-07-02 MED ADMIN — gabapentin (NEURONTIN) capsule 600 mg: ORAL | @ 02:00:00 | NDC 53746010205

## 2013-07-02 MED ADMIN — buPROPion XL (WELLBUTRIN XL) tablet 150 mg: ORAL | @ 13:00:00 | NDC 10370010150

## 2013-07-02 MED ADMIN — clindamycin (CLEOCIN) capsule 450 mg: ORAL | @ 10:00:00 | NDC 00591570801

## 2013-07-02 MED ADMIN — prazosin (MINIPRESS) capsule 2 mg: ORAL | @ 02:00:00 | NDC 00378110101

## 2013-07-02 MED ADMIN — prazosin (MINIPRESS) capsule 2 mg: ORAL | @ 10:00:00 | NDC 00378110101

## 2013-07-02 MED FILL — MAG-AL 200 MG-200 MG/5 ML ORAL SUSPENSION: 200-200 mg/5 mL | ORAL | Qty: 30

## 2013-07-02 MED FILL — CLINDAMYCIN 150 MG CAP: 150 mg | ORAL | Qty: 3

## 2013-07-02 MED FILL — OXYCODONE 5 MG TAB: 5 mg | ORAL | Qty: 3

## 2013-07-02 MED FILL — SENNA PLUS 8.6 MG-50 MG TABLET: ORAL | Qty: 1

## 2013-07-02 MED FILL — K-TAB 10 MEQ TABLET,EXTENDED RELEASE: 10 mEq | ORAL | Qty: 1

## 2013-07-02 MED FILL — ONDANSETRON 4 MG TAB, RAPID DISSOLVE: 4 mg | ORAL | Qty: 1

## 2013-07-02 MED FILL — ACETAMINOPHEN 325 MG TABLET: 325 mg | ORAL | Qty: 2

## 2013-07-02 MED FILL — AMLODIPINE 5 MG TAB: 5 mg | ORAL | Qty: 1

## 2013-07-02 MED FILL — ONE DAILY MULTIVITAMINS WITH MINERALS 4.5 MG IRON TABLET: 4.5 mg iron | ORAL | Qty: 1

## 2013-07-02 MED FILL — GABAPENTIN 300 MG CAP: 300 mg | ORAL | Qty: 1

## 2013-07-02 MED FILL — DESIPRAMINE 25 MG TAB: 25 mg | ORAL | Qty: 2

## 2013-07-02 MED FILL — VENLAFAXINE SR 150 MG 24 HR CAP: 150 mg | ORAL | Qty: 1

## 2013-07-02 MED FILL — ZOLPIDEM 5 MG TAB: 5 mg | ORAL | Qty: 1

## 2013-07-02 MED FILL — PRISTIQ 50 MG TABLET,EXTENDED RELEASE: 50 mg | ORAL | Qty: 2

## 2013-07-02 MED FILL — BUPROPION XL 150 MG 24 HR TAB: 150 mg | ORAL | Qty: 1

## 2013-07-02 MED FILL — PANTOPRAZOLE 40 MG TAB, DELAYED RELEASE: 40 mg | ORAL | Qty: 1

## 2013-07-02 MED FILL — FLORA-Q 8 BILLION CELL CAP: 8 billion cell | ORAL | Qty: 1

## 2013-07-02 MED FILL — PRAZOSIN 1 MG CAP: 1 mg | ORAL | Qty: 2

## 2013-07-02 MED FILL — FERROUS SULFATE 325 MG (65 MG ELEMENTAL IRON) TAB: 325 mg (65 mg iron) | ORAL | Qty: 1

## 2013-07-02 MED FILL — CEFDINIR 300 MG CAP: 300 mg | ORAL | Qty: 1

## 2013-07-02 MED FILL — ATENOLOL 50 MG TAB: 50 mg | ORAL | Qty: 2

## 2013-07-02 MED FILL — CHLORTHALIDONE 25 MG TAB: 25 mg | ORAL | Qty: 1

## 2013-07-02 MED FILL — GABAPENTIN 300 MG CAP: 300 mg | ORAL | Qty: 2

## 2013-07-04 LAB — CULTURE, WOUND W GRAM STAIN
Culture result:: NO GROWTH
GRAM STAIN: NONE SEEN
GRAM STAIN: NONE SEEN

## 2013-07-06 NOTE — H&P (Signed)
Deanna Cortez  Location: [facility]  Patient #: [id number]  DOB: Jun 23, 1940  Married / Language: English / Race: Black or African American, White  Female      ??  History of Present Illness??  ??????????????????The patient is a 73 year old female who presents for a recheck of Post-Operative. Patient is 6 weeks postop procedure. Patient's symptoms are slightly improved compared to preoperative. Post operative pain has been severe. Pain medications include: Percocet . ??Post operative treatment includes bracing (TLSO) and use of cane. Weight-bearing status: weight-bearing as tolerated. Patient has been compliant with post operative instructions. Note for "Post-Operative":    Ms. Deanna Cortez turns today for a postoperative followup visit. ??She is approximately 5-6 weeks out from her recent lumbar fusion. ??Unfortunately, she states that she has not done well since her last office visit. ??She states that she has worsened bilateral anterior thigh pain over the past 4 weeks despite medication changes at her last office visit. ??She states that her pain has become so debilitating that her mobility is quite limited, and she is having have assistance with her activities of daily living. ??She denies any incisional difficulties. ??She does report weakness in her legs as well. ??She denies any difficulty with her TLSO brace. ??She has been using gabapentin, and a muscle relaxant, and oxycodone as well.      ??  ??  Allergies??  Duract *ANALGESICS - ANTI-INFLAMMATORY*  Augmentin *PENICILLINS*  Cephalexin *CEPHALOSPORINS*  NexIUM *ULCER DRUGS*  Erythromycins  TraMADol HCl *ANALGESICS - OPIOID*  Arthrotec *ANALGESICS - ANTI-INFLAMMATORY*  Reglan *GASTROINTESTINAL AGENTS - MISC.*  Sulfa Drugs  Darvocet-N 100 *ANALGESICS - OPIOID*  Levaquin *Fluoroquinolones**  Vioxx *ANALGESICS - ANTI-INFLAMMATORY*  Cipro *FLUOROQUINOLONES*  Meloxicam *ANALGESICS - ANTI-INFLAMMATORY*  Lescol *ANTIHYPERLIPIDEMICS*  Pravachol *ANTIHYPERLIPIDEMICS*  Prempro  *ESTROGENS*  Crestor *ANTIHYPERLIPIDEMICS*  Simvastatin *ANTIHYPERLIPIDEMICS*  Zoloft *ANTIDEPRESSANTS*  Amitriptyline HCl *ANTIDEPRESSANTS*  Metaxalone *MUSCULOSKELETAL THERAPY AGENTS*  Flexeril *MUSCULOSKELETAL THERAPY AGENTS*  Lyrica *ANTICONVULSANTS*    ??  ??  Medication History??  Medications Reconciled.    ??  ??  Diagnostic Studies History??  Review of the MRI shows edema within the vertebral body above the lateral implant.?? She's having subsidence with the current of the stenosis posteriorly.    ??  ??  Physical Exam??  The physical exam findings are as follows:    ??  Integumentary??  Problem #1:??Assessment of Surgical Incision??- consistent with normal anticipated wound healing.      Neurologic??  Sensory:??  Light Touch:??Intact??- Globally.  Overall Assessment of Muscle Strength and Tone reveals:  Lower Extremities:??Right Iliopsoas??- 4-/5. Left Iliopsoas??- 4-/5. Right Tibialis Anterior??- 5/5. Left Tibialis Anterior??- 5/5. Right Gastroc-Soleus??- 5/5. Left Gastroc-Soleus??- 5/5. Right EHL??- 5/5. Left EHL??- 5/5.  General Assessment of Reflexes: Right Ankle??- Clonus is not present. Left Ankle??- Clonus is not present.  Reflexes (Dermatomes):??2/2 Normal??- Left Achilles (L5-S2), Left Knee (L2-4), Right Achilles (L5-S2) and Right Knee (L2-4).      Musculoskeletal  Global Assessment??  Examination of related systems reveals - well-developed, well-nourished, in no acute distress, alert and oriented x 3. Gait and Station??- normal posture. Note: Patient ambulates with a cane.?? Right Lower Extremity??- normal strength and tone, normal range of motion without pain and no instability, subluxation or laxity. Left Lower Extremity??- normal strength and tone, normal range of motion without pain and no instability, subluxation or laxity.  Spine/Ribs/Pelvis??  Cervical Spine:??Examination of the cervical spine reveals - no tenderness to palpation, no pain, no swelling, edema or erythema, normal  cervical spine movements and normal sensation.   Thoracic (Dorsal) Spine:??Examination of the thoracic spine reveals - no tenderness over thoracic vertebrae, no pain, normal sensation and normal thoracic spine movements.  Lumbosacral Spine:??Examination of the lumbosacral spine reveals - no known fractures or deformities.  Inspection and Palpation:??Tenderness??- moderate.  Assessment of pain reveals the following findings: The pain is characterized as - severe. Location??- pain refers to lower back bilaterally.  Functional Testing??- Babinski Test negative, Prone Knee Bending Test negative, Slump Test negative and Straight Leg Raising Test negative.      ??  Assessment & Plan??  Fracture lumbar vertebra-closed (805.4   S32.009A)  Current Plans  ????l????  Continued OxyCODONE HCl 5MG , 1-3 Tablet every 4-6 hours, as needed, #100, 06/16/2013, No Refill.  ????l????  Started OxyCONTIN 10MG , 1 (one) Tab 12HR Deter two times daily, #40, 06/16/2013, No Refill.  ??  Status post lumbar spinal fusion (V45.4   Z98.1)  Impression: The patient's radiologic findings have been reviewed with her in detail today. Unfortunately she is involved the compression deformity above her lateral interbody fusion. She is also having subsidence around the graft. This is recreating the stenosis that she had prior to the indirect compression. Unfortunately I think she is going to need a revision laminectomy above her fusion with also extension of her fusion to the thoracolumbar junction for stabilization. She possibly will need extension to the pelvis of her fusion although we will try to avoid that. Risk and benefits were discussed with her and her daughter. The risks and benefits were discussed at length with the patient and the patient has elected to proceed. Indications for surgery include failed conservative treatment. Alternative treatments, risks and the perioperative course were discussed with the patient. All questions were answered. The risks and benefits of the procedure were explained. Benefits  include definitive diagnosis, relief of pain, elimination of deformity and improved function. Risks of surgery including bleeding, infection, weakness, numbness, CSF leak, failure to improve symptoms, exacerbation of medical co-morbidities and even death were discussed with the patient.  ??  STENOSIS (724.02)  ??  ??  ????  ??????Signed electronically by Kem ParkinsonJed Jaicey Sweaney, MD

## 2013-12-26 ENCOUNTER — Encounter

## 2013-12-26 ENCOUNTER — Inpatient Hospital Stay: Admit: 2013-12-26 | Payer: MEDICARE | Attending: Specialist | Primary: Internal Medicine

## 2013-12-26 DIAGNOSIS — M5137 Other intervertebral disc degeneration, lumbosacral region: Secondary | ICD-10-CM

## 2014-04-05 ENCOUNTER — Other Ambulatory Visit: Payer: Self-pay | Admitting: Orthopedic Surgery

## 2014-04-14 ENCOUNTER — Telehealth: Payer: Self-pay | Admitting: Surgery

## 2014-04-14 ENCOUNTER — Other Ambulatory Visit: Payer: Self-pay

## 2014-04-14 NOTE — Telephone Encounter (Signed)
-----   Message from Sherrye Payor, RN sent at 04/14/2014  9:46 AM EST ----- Regarding: needs new pt. consult / VWB Please schedule for a new pt. Consult prior to scheduled ALIF 05/04/14 (VWB is assisting Dr. Lynann Bologna)  Please remind the pt. To bring a copy of her LS spine film with her to the appt.  Note- per MD requests it would be better to schedule these spine exposure consults 2 weeks ahead of their scheduled surgery, if at all possible. (I know sometimes you don't get the request that far in advance)

## 2014-04-14 NOTE — Telephone Encounter (Signed)
I spoke with Karen Castillo and she is aware. She will obtain her LS Spine films as well. dpm

## 2014-04-25 ENCOUNTER — Encounter (HOSPITAL_COMMUNITY)
Admission: RE | Admit: 2014-04-25 | Discharge: 2014-04-25 | Disposition: A | Discharge status: Home / Self Care | Payer: PPO | Source: Ambulatory Visit | Attending: Orthopedic Surgery | Admitting: Orthopedic Surgery

## 2014-04-25 ENCOUNTER — Encounter (HOSPITAL_COMMUNITY): Payer: Self-pay

## 2014-04-25 DIAGNOSIS — Z01818 Encounter for other preprocedural examination: Secondary | ICD-10-CM

## 2014-04-25 DIAGNOSIS — M9689 Other intraoperative and postprocedural complications and disorders of the musculoskeletal system: Secondary | ICD-10-CM | POA: Diagnosis not present

## 2014-04-25 DIAGNOSIS — M545 Low back pain: Secondary | ICD-10-CM | POA: Diagnosis not present

## 2014-04-25 HISTORY — DX: Bipolar disorder, unspecified: F31.9

## 2014-04-25 HISTORY — DX: Sleep apnea, unspecified: G47.30

## 2014-04-25 HISTORY — DX: Essential (primary) hypertension: I10

## 2014-04-25 HISTORY — DX: Unspecified osteoarthritis, unspecified site: M19.90

## 2014-04-25 HISTORY — DX: Gastro-esophageal reflux disease without esophagitis: K21.9

## 2014-04-25 HISTORY — DX: Malignant (primary) neoplasm, unspecified: C80.1

## 2014-04-25 LAB — COMPREHENSIVE METABOLIC PANEL
ALT: 25 U/L (ref 0–35)
ANION GAP: 12 (ref 5–15)
AST: 29 U/L (ref 0–37)
Albumin: 3.7 g/dL (ref 3.5–5.2)
Alkaline Phosphatase: 96 U/L (ref 39–117)
BUN: 7 mg/dL (ref 6–23)
CALCIUM: 9.4 mg/dL (ref 8.4–10.5)
CO2: 25 mmol/L (ref 19–32)
Chloride: 102 mmol/L (ref 96–112)
Creatinine, Ser: 0.83 mg/dL (ref 0.50–1.10)
GFR, EST AFRICAN AMERICAN: 80 mL/min — AB (ref >90)
GFR, EST NON AFRICAN AMERICAN: 69 mL/min — AB (ref >90)
GLUCOSE: 155 mg/dL — AB (ref 70–99)
Potassium: 3.1 mmol/L — ABNORMAL LOW (ref 3.5–5.1)
SODIUM: 139 mmol/L (ref 135–145)
TOTAL PROTEIN: 7.3 g/dL (ref 6.0–8.3)
Total Bilirubin: 0.7 mg/dL (ref 0.3–1.2)

## 2014-04-25 LAB — CBC WITH DIFFERENTIAL/PLATELET
Basophils Absolute: 0.1 10*3/uL (ref 0.0–0.1)
Basophils Relative: 1 % (ref 0–1)
Eosinophils Absolute: 0.1 10*3/uL (ref 0.0–0.7)
Eosinophils Relative: 2 % (ref 0–5)
HCT: 38.2 % (ref 36.0–46.0)
Hemoglobin: 12.6 g/dL (ref 12.0–15.0)
LYMPHS PCT: 34 % (ref 12–46)
Lymphs Abs: 2.1 10*3/uL (ref 0.7–4.0)
MCH: 28.8 pg (ref 26.0–34.0)
MCHC: 33 g/dL (ref 30.0–36.0)
MCV: 87.2 fL (ref 78.0–100.0)
Monocytes Absolute: 0.4 10*3/uL (ref 0.1–1.0)
Monocytes Relative: 6 % (ref 3–12)
NEUTROS ABS: 3.6 10*3/uL (ref 1.7–7.7)
Neutrophils Relative %: 57 % (ref 43–77)
PLATELETS: 248 10*3/uL (ref 150–400)
RBC: 4.38 MIL/uL (ref 3.87–5.11)
RDW: 13.8 % (ref 11.5–15.5)
WBC: 6.3 10*3/uL (ref 4.0–10.5)

## 2014-04-25 LAB — PROTIME-INR
INR: 1 (ref 0.00–1.49)
Prothrombin Time: 13.3 seconds (ref 11.6–15.2)

## 2014-04-25 LAB — URINALYSIS, ROUTINE W REFLEX MICROSCOPIC
BILIRUBIN URINE: NEGATIVE
GLUCOSE, UA: NEGATIVE mg/dL
Hgb urine dipstick: NEGATIVE
Ketones, ur: NEGATIVE mg/dL
Leukocytes, UA: NEGATIVE
NITRITE: NEGATIVE
PH: 6.5 (ref 5.0–8.0)
Protein, ur: NEGATIVE mg/dL
SPECIFIC GRAVITY, URINE: 1.015 (ref 1.005–1.030)
Urobilinogen, UA: 0.2 mg/dL (ref 0.0–1.0)

## 2014-04-25 LAB — SURGICAL PCR SCREEN
MRSA, PCR: NEGATIVE
STAPHYLOCOCCUS AUREUS: NEGATIVE

## 2014-04-25 LAB — ABO/RH: ABO/RH(D): O POS

## 2014-04-25 LAB — TYPE AND SCREEN
ABO/RH(D): O POS
Antibody Screen: NEGATIVE

## 2014-04-25 LAB — APTT: APTT: 28 s (ref 24–37)

## 2014-04-25 NOTE — Pre-Procedure Instructions (Signed)
Shikira Folino  04/25/2014   Your procedure is scheduled on:  05/04/14  Report to [facility] Admitting at 530 AM.  Call this number if you have problems the morning of surgery: [phone]   Remember:   Do not eat food or drink liquids after midnight.   Take these medicines the morning of surgery with A SIP OF WATER: norvasc,atenolol,hygroton,neurontin,minipress   Do not wear jewelry, make-up or nail polish.  Do not wear lotions, powders, or perfumes. You may wear deodorant.  Do not shave 48 hours prior to surgery. Men may shave face and neck.  Do not bring valuables to the hospital.  [facility] is not responsible                  for any belongings or valuables.               Contacts, dentures or bridgework may not be worn into surgery.  Leave suitcase in the car. After surgery it may be brought to your room.  For patients admitted to the hospital, discharge time is determined by your                treatment team.               Patients discharged the day of surgery will not be allowed to drive  home.  Name and phone number of your driver: family  Special Instructions: Shower using CHG 2 nights before surgery and the night before surgery.  If you shower the day of surgery use CHG.  Use special wash - you have one bottle of CHG for all showers.  You should use approximately 1/3 of the bottle for each shower.   Please read over the following fact sheets that you were given: Pain Booklet, Coughing and Deep Breathing, Blood Transfusion Information, MRSA Information and Surgical Site Infection Prevention

## 2014-04-25 NOTE — Progress Notes (Signed)
REQUESTED SLEEP STUDY FROM [place] , [place].

## 2014-04-27 ENCOUNTER — Encounter (HOSPITAL_COMMUNITY): Payer: Self-pay

## 2014-04-27 NOTE — Progress Notes (Addendum)
Anesthesia Chart Review:  Patient is a 74 year old female scheduled for L5-S1 ALIF on 04/24/14 by Dr. Lynann Bologna with anterior exposure by Dr. Trula Slade.  History includes non-smoker, HTN, GERD, OSA with CPAP, Bipolar disorder, skin and left breast cancer, lumbar and thoracic fusion '15. BMI is consistent with morbid obesity. PCP is listed as Dr. Charletta Cousin.   04/25/14 EKG: NSR, cannot rule out anterior infarct (age undetermined). Currently, no comparison EKG is available. Records requested, but are pending from Dr. Marcello Moores' office.  04/25/14 CXR: IMPRESSION: There is mild prominence of the interstitial markings which may reflect a smoking history if any or reflect other chronic fibrotic process. There is no evidence of pneumonia, CHF, or other acute cardiopulmonary abnormality.  Preoperative labs noted.   I can review any additional records received from Dr. Marcello Moores.  Epic records indicate that she underwent two back fusion last year, unless there are new CV symptoms then I would anticipate that she could proceed as planned. She will be further evaluated by her assigned anesthesiologist on the day of surgery.  Chart will be left for follow-up PCP records.  George Hugh [facility] Short Stay Center/Anesthesiology Phone [phone] 04/27/2014 10:02 AM  Addendum: PCP records received.  Patient was last seen for CPE on 04/19/14 by Dr. Marcello Moores.  He was aware of plans for upcoming surgery. No prior EKG or other cardiac studies at his office.  His note mentions Patient underwent prior lumbar fusions in 2010, 04/2013, and 06/2013. No change in above mentioned plan.  George Hugh [facility] Short Stay Center/Anesthesiology Phone [phone] 05/02/2014 2:15 PM

## 2014-04-28 ENCOUNTER — Encounter: Payer: Self-pay | Admitting: Surgery

## 2014-05-01 ENCOUNTER — Encounter: Payer: Self-pay | Admitting: Surgery

## 2014-05-01 ENCOUNTER — Ambulatory Visit (INDEPENDENT_AMBULATORY_CARE_PROVIDER_SITE_OTHER): Payer: PPO | Admitting: Surgery

## 2014-05-01 VITALS — BP 146/63 | HR 74 | Ht 65.5 in | Wt 259.0 lb

## 2014-05-01 DIAGNOSIS — M479 Spondylosis, unspecified: Secondary | ICD-10-CM

## 2014-05-01 NOTE — Progress Notes (Signed)
REREQUESTED RECORDS FROM DR. THOMAS'S OFFICE (EKG, STRESS TEST / ECHO IF DONE, LAST OV).  [phone]

## 2014-05-01 NOTE — Progress Notes (Signed)
Patient name: Karen Castillo MRN: [id number] DOB: May 28, 1940 Sex: female   Referred by: Dr. Lynann Bologna  Reason for referral:  Chief Complaint  Patient presents with  . New Evaluation    consult prior to ALIF    HISTORY OF PRESENT ILLNESS: The patient is here today for preoperative evaluation.  She is scheduled to undergo an anterior approach for L5-S1 instrumentation.  She has undergone multiple back surgeries from a posterior approach and continues to have persistent pain.  Patient is medically managed for hypertension.  She denies a history of smoking.  Her only abdominal surgeries have been a bilateral tubal ligation many years ago and a laparoscopic cholecystectomy.  Past Medical History  Diagnosis Date  . Hypertension   . Sleep apnea     USES CPAP   . Bipolar disorder   . GERD (gastroesophageal reflux disease)   . Arthritis   . Cancer     SKIN    ,    LEFT BREAST     Past Surgical History  Procedure Laterality Date  . Breast surgery      LEFT BRREAST BX  . Tubal ligation    . Carpal tunnel release      1994 RT  . Leep      [id number]  . Cholecystectomy      2004  . Ercp      2009  . Colonoscopy w/ biopsies    . Skin cancer excision      X 4   . Back surgery      2010 ,2015LUMB FUSION, 06/2013 [facility] FUSION    History   Social History  . Marital Status: Widowed    Spouse Name: N/A  . Number of Children: N/A  . Years of Education: N/A   Occupational History  . Not on file.   Social History Main Topics  . Smoking status: Never Smoker   . Smokeless tobacco: Not on file  . Alcohol Use: No  . Drug Use: No  . Sexual Activity: Not on file   Other Topics Concern  . Not on file   Social History Narrative    No family history on file.  Allergies as of 05/01/2014 - Review Complete 05/01/2014  Allergen Reaction Noted  . Lyrica [pregabalin] Anaphylaxis 04/20/2014  . Amitriptyline  04/20/2014  . Arthrotec [diclofenac-misoprostol] Diarrhea  04/20/2014  . Augmentin [amoxicillin-pot clavulanate] Diarrhea 04/20/2014  . Cephalexin Diarrhea 04/20/2014  . Ciprofloxacin Swelling 04/20/2014  . Crestor [rosuvastatin]  04/20/2014  . Darvon [propoxyphene] Diarrhea 04/20/2014  . Duract [bromfenac] Diarrhea 04/20/2014  . Erythromycin Diarrhea 04/20/2014  . Flexeril [cyclobenzaprine] Swelling 04/20/2014  . Lescol [fluvastatin]  04/20/2014  . Levaquin [levofloxacin]  04/20/2014  . Meloxicam Swelling 04/20/2014  . Mirapex [pramipexole] Nausea And Vomiting 04/20/2014  . Nexium [esomeprazole] Diarrhea 04/20/2014  . Pravastatin  04/20/2014  . Prempro [conj estrog-medroxyprogest ace]  04/20/2014  . Reglan [metoclopramide] Diarrhea 04/20/2014  . Statins  04/20/2014  . Sulfur Diarrhea 04/20/2014  . Tramadol Diarrhea 04/20/2014  . Vioxx [rofecoxib] Diarrhea 04/20/2014  . Zoloft [sertraline hcl]  04/20/2014  . Metaxalone Rash 04/20/2014    Current Outpatient Prescriptions on File Prior to Visit  Medication Sig Dispense Refill  . acetaminophen (TYLENOL) 500 MG tablet Take 1,000 mg by mouth daily as needed for mild pain or moderate pain.    Marland Kitchen amLODipine (NORVASC) 5 MG tablet Take 5 mg by mouth daily.    Marland Kitchen atenolol (TENORMIN) 100 MG tablet Take 100 mg  by mouth daily.    . B Complex-C (SUPER B COMPLEX PO) Take 1 tablet by mouth daily.    . Biotin 10 MG TABS Take 1 tablet by mouth daily.    Marland Kitchen buPROPion (WELLBUTRIN XL) 150 MG 24 hr tablet Take 150 mg by mouth daily.    . Calcium Carb-Cholecalciferol 600-800 MG-UNIT TABS Take 1 tablet by mouth daily.    . calcium carbonate (OS-CAL) 1250 (500 CA) MG chewable tablet Chew 2 tablets by mouth daily.    . chlorthalidone (HYGROTON) 25 MG tablet Take 25 mg by mouth daily.    . Cholecalciferol (VITAMIN D) 2000 UNITS CAPS Take 1 capsule by mouth daily.    . Coenzyme Q10 200 MG capsule Take 200 mg by mouth daily.    Marland Kitchen desvenlafaxine (PRISTIQ) 100 MG 24 hr tablet Take 100 mg by mouth daily.    Marland Kitchen gabapentin  (NEURONTIN) 300 MG capsule Take 300 mg by mouth 3 (three) times daily.    Marland Kitchen glucosamine-chondroitin 500-400 MG tablet Take 2 tablets by mouth daily.    Marland Kitchen HYDROcodone-acetaminophen (NORCO) 7.5-325 MG per tablet Take 1-2 tablets by mouth every 4 (four) hours as needed for moderate pain.    Marland Kitchen lansoprazole (PREVACID) 30 MG capsule Take 30 mg by mouth 2 (two) times daily before a meal.    . latanoprost (XALATAN) 0.005 % ophthalmic solution Place 1 drop into both eyes at bedtime.    . Magnesium 250 MG TABS Take 1 tablet by mouth daily.    . methocarbamol (ROBAXIN) 750 MG tablet Take 750 mg by mouth 4 (four) times daily.    . Multiple Vitamins-Minerals (COMPLETE MULTIVITAMIN/MINERAL PO) Take 1 tablet by mouth daily.    . Omega-3 Fatty Acids (FISH OIL) 1000 MG CAPS Take 1 capsule by mouth 3 (three) times daily.    . prazosin (MINIPRESS) 2 MG capsule Take 2 mg by mouth 3 (three) times daily.    . Probiotic Product (ALIGN PO) Take 1 capsule by mouth daily.    . timolol (BETIMOL) 0.5 % ophthalmic solution Place 1 drop into both eyes every morning.    . vitamin C (ASCORBIC ACID) 500 MG tablet Take 500 mg by mouth 2 (two) times daily.    . vitamin E 400 UNIT capsule Take 400 Units by mouth daily.    . Zinc 50 MG TABS Take 1 tablet by mouth daily.    Marland Kitchen zolpidem (AMBIEN) 10 MG tablet Take 10 mg by mouth at bedtime.    . clonazePAM (KLONOPIN) 0.5 MG tablet Take 0.25 mg by mouth daily.     No current facility-administered medications on file prior to visit.     REVIEW OF SYSTEMS: Cardiovascular: No chest pain, chest pressure, palpitations, orthopnea, or dyspnea on exertion. No claudication or rest pain,  No history of DVT or phlebitis. Pulmonary: No productive cough, asthma or wheezing. Neurologic: No weakness, paresthesias, aphasia, or amaurosis. No dizziness. Hematologic: No bleeding problems or clotting disorders. Musculoskeletal: Chronic back pain. Gastrointestinal: No blood in stool or  hematemesis Genitourinary: No dysuria or hematuria. Psychiatric:: No history of major depression. Integumentary: No rashes or ulcers. Constitutional: No fever or chills.  PHYSICAL EXAMINATION: General: The patient appears their stated age.  Vital signs are BP 146/63 mmHg  Pulse 74  Ht 5' 5.5" (1.664 m)  Wt 259 lb (117.482 kg)  BMI 42.43 kg/m2  SpO2 95% HEENT:  No gross abnormalities Pulmonary: Respirations are non-labored Abdomen: Soft and non-tender, obese  Musculoskeletal: There are no  major deformities.   Neurologic: No focal weakness or paresthesias are detected, Skin: There are no ulcer or rashes noted. Psychiatric: The patient has normal affect. Cardiovascular: Palpable bilateral dorsalis pedis and posterior tibial arteries.  Palpable left femoral pulse.  Diagnostic Studies: I have reviewed her MRI and x-rays.  No significant arterial calcification is noted via x-ray.   Assessment:  Degenerative back disease Plan: I feel it is reasonable to proceed with anterior approach for L5-S1 instrumentation.  I discussed the risks and benefits of the anterior approach including injury to the artery vein and ureter.  We also discussed the possibility of wound complications and delayed bowel function.  Her operation is been scheduled for this Thursday.     Eldridge Abrahams, M.D. [facility] Office: [phone] Pager:  [phone]

## 2014-05-03 MED ORDER — CHLORHEXIDINE GLUCONATE 4 % EX LIQD
60.0000 mL | Freq: Once | CUTANEOUS | Status: DC
  Filled 2014-05-03: qty 60

## 2014-05-03 MED ORDER — POVIDONE-IODINE 7.5 % EX SOLN
Freq: Once | CUTANEOUS | Status: DC
  Filled 2014-05-03: qty 118

## 2014-05-03 MED ORDER — CEFAZOLIN SODIUM-DEXTROSE 2-3 GM-% IV SOLR
2.0000 g | INTRAVENOUS | Status: DC
  Filled 2014-05-03: qty 50

## 2014-05-03 NOTE — H&P (Signed)
PREOPERATIVE H&P  Chief Complaint: low back pain, left leg pain  HPI: Karen Castillo is a 74 y.o. 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 Hypertension   . Sleep apnea     USES CPAP   . Bipolar disorder   . GERD (gastroesophageal reflux disease)   . Arthritis   . Cancer     SKIN    ,    LEFT BREAST    Past Surgical History  Procedure Laterality Date  . Breast surgery      LEFT BRREAST BX  . Tubal ligation    . Carpal tunnel release      1994 RT  . Leep      [id number]  . Cholecystectomy      2004  . Ercp      2009  . Colonoscopy w/ biopsies    . Skin cancer excision      X 4   . Back surgery      2010 ,2015LUMB FUSION, 06/2013 [facility] FUSION   History   Social History  . Marital Status: Widowed    Spouse Name: N/A  . Number of Children: N/A  . Years of Education: N/A   Social History Main Topics  . Smoking status: Never Smoker   . Smokeless tobacco: Not on file  . Alcohol Use: No  . Drug Use: No  . Sexual Activity: Not on file   Other Topics Concern  . Not on file   Social History Narrative   No family history on file. Allergies  Allergen Reactions  . Lyrica [Pregabalin] Anaphylaxis  . Amitriptyline     Weird feeling  . Arthrotec [Diclofenac-Misoprostol] Diarrhea  . Augmentin [Amoxicillin-Pot Clavulanate] Diarrhea  . Cephalexin Diarrhea  . Ciprofloxacin Swelling    Tounge  . Crestor [Rosuvastatin]     Leg pain   . Darvon [Propoxyphene] Diarrhea  . Duract [Bromfenac] Diarrhea  . Erythromycin Diarrhea  . Flexeril [Cyclobenzaprine] Swelling    Feet   . Lescol [Fluvastatin]     Leg pain  .  Levaquin [Levofloxacin]   . Meloxicam Swelling    Feet   . Mirapex [Pramipexole] Nausea And Vomiting  . Nexium [Esomeprazole] Diarrhea  . Pravastatin     Leg pain  . Prempro [Conj Estrog-Medroxyprogest Ace]     Could not take  . Reglan [Metoclopramide] Diarrhea  . Statins     Leg pain   . Sulfur Diarrhea  . Tramadol Diarrhea  . Vioxx [Rofecoxib] Diarrhea  . Zoloft [Sertraline Hcl]     Sensitivity with teeth  . Metaxalone Rash    Flushing of the skin   Prior to Admission medications   Medication Sig Start Date End Date Taking? Authorizing Provider  acetaminophen (TYLENOL) 500 MG tablet Take 1,000 mg by mouth daily as needed for mild pain or moderate pain.   Yes Historical Provider, MD  amLODipine (NORVASC) 5 MG tablet Take 5 mg by mouth daily.   Yes Historical Provider, MD  atenolol (TENORMIN) 100 MG tablet Take 100 mg by mouth daily.   Yes Historical Provider, MD  B Complex-C (SUPER B COMPLEX PO)  Take 1 tablet by mouth daily.   Yes Historical Provider, MD  Biotin 10 MG TABS Take 1 tablet by mouth daily.   Yes Historical Provider, MD  buPROPion (WELLBUTRIN XL) 150 MG 24 hr tablet Take 150 mg by mouth daily.   Yes Historical Provider, MD  Calcium Carb-Cholecalciferol 600-800 MG-UNIT TABS Take 1 tablet by mouth daily.   Yes Historical Provider, MD  calcium carbonate (OS-CAL) 1250 (500 CA) MG chewable tablet Chew 2 tablets by mouth daily.   Yes Historical Provider, MD  chlorthalidone (HYGROTON) 25 MG tablet Take 25 mg by mouth daily.   Yes Historical Provider, MD  Cholecalciferol (VITAMIN D) 2000 UNITS CAPS Take 1 capsule by mouth daily.   Yes Historical Provider, MD  clonazePAM (KLONOPIN) 0.5 MG tablet Take 0.25 mg by mouth daily.   Yes Historical Provider, MD  Coenzyme Q10 200 MG capsule Take 200 mg by mouth daily.   Yes Historical Provider, MD  desvenlafaxine (PRISTIQ) 100 MG 24 hr tablet Take 100 mg by mouth daily.   Yes Historical Provider, MD  gabapentin (NEURONTIN) 300 MG  capsule Take 300 mg by mouth 3 (three) times daily.   Yes Historical Provider, MD  glucosamine-chondroitin 500-400 MG tablet Take 2 tablets by mouth daily.   Yes Historical Provider, MD  HYDROcodone-acetaminophen (NORCO) 7.5-325 MG per tablet Take 1-2 tablets by mouth every 4 (four) hours as needed for moderate pain.   Yes Historical Provider, MD  lansoprazole (PREVACID) 30 MG capsule Take 30 mg by mouth 2 (two) times daily before a meal.   Yes Historical Provider, MD  latanoprost (XALATAN) 0.005 % ophthalmic solution Place 1 drop into both eyes at bedtime.   Yes Historical Provider, MD  Magnesium 250 MG TABS Take 1 tablet by mouth daily.   Yes Historical Provider, MD  methocarbamol (ROBAXIN) 750 MG tablet Take 750 mg by mouth 4 (four) times daily.   Yes Historical Provider, MD  Multiple Vitamins-Minerals (COMPLETE MULTIVITAMIN/MINERAL PO) Take 1 tablet by mouth daily.   Yes Historical Provider, MD  Omega-3 Fatty Acids (FISH OIL) 1000 MG CAPS Take 1 capsule by mouth 3 (three) times daily.   Yes Historical Provider, MD  prazosin (MINIPRESS) 2 MG capsule Take 2 mg by mouth 3 (three) times daily.   Yes Historical Provider, MD  Probiotic Product (ALIGN PO) Take 1 capsule by mouth daily.   Yes Historical Provider, MD  timolol (BETIMOL) 0.5 % ophthalmic solution Place 1 drop into both eyes every morning.   Yes Historical Provider, MD  vitamin C (ASCORBIC ACID) 500 MG tablet Take 500 mg by mouth 2 (two) times daily.   Yes Historical Provider, MD  vitamin E 400 UNIT capsule Take 400 Units by mouth daily.   Yes Historical Provider, MD  Zinc 50 MG TABS Take 1 tablet by mouth daily.   Yes Historical Provider, MD  zolpidem (AMBIEN) 10 MG tablet Take 10 mg by mouth at bedtime.   Yes Historical Provider, MD     All other systems have been reviewed and were otherwise negative with the exception of those mentioned in the HPI and as above.  Physical Exam: There were no vitals filed for this visit.  General:  Alert, no acute distress Cardiovascular: No pedal edema Respiratory: No cyanosis, no use of accessory musculature Skin: No lesions in the area of chief complaint Neurologic: Sensation intact distally Psychiatric: Patient is competent for consent with normal mood and affect Lymphatic: No axillary or cervical lymphadenopathy   Assessment/Plan: Left  leg pain, low back pain, nonunion L5/S1 Plan for Procedure(s): ANTERIOR LUMBAR FUSION 1 LEVEL, L5/S1   Sinclair Ship, MD 05/03/2014 3:07 PM

## 2014-05-04 ENCOUNTER — Encounter (HOSPITAL_COMMUNITY): Payer: Self-pay | Admitting: *Deleted

## 2014-05-04 ENCOUNTER — Inpatient Hospital Stay (HOSPITAL_COMMUNITY)
Admission: RE | Admit: 2014-05-04 | Discharge: 2014-05-06 | DRG: 908 | Disposition: A | Discharge status: Home / Self Care | Payer: PPO | Source: Ambulatory Visit | Attending: Orthopedic Surgery | Admitting: Orthopedic Surgery

## 2014-05-04 ENCOUNTER — Encounter (HOSPITAL_COMMUNITY)
Admission: RE | Disposition: A | Discharge status: Home / Self Care | Payer: PPO | Source: Ambulatory Visit | Attending: Orthopedic Surgery

## 2014-05-04 ENCOUNTER — Other Ambulatory Visit: Payer: Self-pay

## 2014-05-04 ENCOUNTER — Inpatient Hospital Stay (HOSPITAL_COMMUNITY): Payer: PPO

## 2014-05-04 ENCOUNTER — Inpatient Hospital Stay (HOSPITAL_COMMUNITY): Payer: PPO | Admitting: Vascular Surgery

## 2014-05-04 ENCOUNTER — Inpatient Hospital Stay (HOSPITAL_COMMUNITY): Payer: PPO | Admitting: Certified Registered Nurse Anesthetist

## 2014-05-04 DIAGNOSIS — G4733 Obstructive sleep apnea (adult) (pediatric): Secondary | ICD-10-CM | POA: Diagnosis present

## 2014-05-04 DIAGNOSIS — Z853 Personal history of malignant neoplasm of breast: Secondary | ICD-10-CM

## 2014-05-04 DIAGNOSIS — Z6841 Body Mass Index (BMI) 40.0 and over, adult: Secondary | ICD-10-CM | POA: Diagnosis not present

## 2014-05-04 DIAGNOSIS — Z419 Encounter for procedure for purposes other than remedying health state, unspecified: Secondary | ICD-10-CM

## 2014-05-04 DIAGNOSIS — Z79899 Other long term (current) drug therapy: Secondary | ICD-10-CM

## 2014-05-04 DIAGNOSIS — K219 Gastro-esophageal reflux disease without esophagitis: Secondary | ICD-10-CM | POA: Diagnosis present

## 2014-05-04 DIAGNOSIS — G4730 Sleep apnea, unspecified: Secondary | ICD-10-CM | POA: Diagnosis present

## 2014-05-04 DIAGNOSIS — M9689 Other intraoperative and postprocedural complications and disorders of the musculoskeletal system: Secondary | ICD-10-CM | POA: Diagnosis present

## 2014-05-04 DIAGNOSIS — M5410 Radiculopathy, site unspecified: Secondary | ICD-10-CM | POA: Diagnosis present

## 2014-05-04 DIAGNOSIS — Z85828 Personal history of other malignant neoplasm of skin: Secondary | ICD-10-CM

## 2014-05-04 DIAGNOSIS — I10 Essential (primary) hypertension: Secondary | ICD-10-CM | POA: Diagnosis present

## 2014-05-04 DIAGNOSIS — R52 Pain, unspecified: Secondary | ICD-10-CM

## 2014-05-04 DIAGNOSIS — M5136 Other intervertebral disc degeneration, lumbar region: Secondary | ICD-10-CM

## 2014-05-04 DIAGNOSIS — M545 Low back pain: Secondary | ICD-10-CM | POA: Diagnosis present

## 2014-05-04 DIAGNOSIS — F319 Bipolar disorder, unspecified: Secondary | ICD-10-CM | POA: Diagnosis present

## 2014-05-04 DIAGNOSIS — Y832 Surgical operation with anastomosis, bypass or graft as the cause of abnormal reaction of the patient, or of later complication, without mention of misadventure at the time of the procedure: Secondary | ICD-10-CM | POA: Diagnosis present

## 2014-05-04 HISTORY — DX: Radiculopathy, site unspecified: M54.10

## 2014-05-04 HISTORY — PX: ANTERIOR LUMBAR FUSION: SHX1170

## 2014-05-04 HISTORY — PX: ABDOMINAL EXPOSURE: SHX5708

## 2014-05-04 SURGERY — ANTERIOR LUMBAR FUSION 1 LEVEL
Anesthesia: General

## 2014-05-04 MED ORDER — DOCUSATE SODIUM 100 MG PO CAPS
100.0000 mg | ORAL_CAPSULE | Freq: Two times a day (BID) | ORAL | Status: DC
  Administered 2014-05-05 (×2): 100 mg via ORAL
  Filled 2014-05-04 (×2): qty 1

## 2014-05-04 MED ORDER — CALCIUM CARB-CHOLECALCIFEROL 600-800 MG-UNIT PO TABS: 1.0000 | ORAL_TABLET | Freq: Every day | ORAL | Status: DC

## 2014-05-04 MED ORDER — VENLAFAXINE HCL ER 150 MG PO CP24
150.0000 mg | ORAL_CAPSULE | Freq: Every day | ORAL | Status: DC
  Administered 2014-05-05: 150 mg via ORAL
  Filled 2014-05-04 (×3): qty 1

## 2014-05-04 MED ORDER — DEXAMETHASONE SODIUM PHOSPHATE 10 MG/ML IJ SOLN
INTRAMUSCULAR | Status: AC
  Filled 2014-05-04: qty 1

## 2014-05-04 MED ORDER — DIAZEPAM 5 MG PO TABS
5.0000 mg | ORAL_TABLET | Freq: Four times a day (QID) | ORAL | Status: DC | PRN
  Administered 2014-05-04 – 2014-05-06 (×4): 5 mg via ORAL
  Filled 2014-05-04 (×3): qty 1

## 2014-05-04 MED ORDER — EPHEDRINE SULFATE 50 MG/ML IJ SOLN
INTRAMUSCULAR | Status: DC | PRN
  Administered 2014-05-04 (×2): 10 mg via INTRAVENOUS
  Administered 2014-05-04 (×3): 5 mg via INTRAVENOUS

## 2014-05-04 MED ORDER — MORPHINE SULFATE 2 MG/ML IJ SOLN: 1.0000 mg | INTRAMUSCULAR | Status: DC | PRN

## 2014-05-04 MED ORDER — ATENOLOL 100 MG PO TABS
100.0000 mg | ORAL_TABLET | Freq: Every day | ORAL | Status: DC
  Administered 2014-05-05: 100 mg via ORAL
  Filled 2014-05-04 (×3): qty 1

## 2014-05-04 MED ORDER — MIDAZOLAM HCL 2 MG/2ML IJ SOLN
INTRAMUSCULAR | Status: AC
  Filled 2014-05-04: qty 2

## 2014-05-04 MED ORDER — BUPIVACAINE-EPINEPHRINE (PF) 0.25% -1:200000 IJ SOLN
INTRAMUSCULAR | Status: AC
  Filled 2014-05-04: qty 30

## 2014-05-04 MED ORDER — LIDOCAINE HCL (CARDIAC) 20 MG/ML IV SOLN
INTRAVENOUS | Status: DC | PRN
  Administered 2014-05-04: 70 mg via INTRAVENOUS

## 2014-05-04 MED ORDER — TIMOLOL HEMIHYDRATE 0.5 % OP SOLN
1.0000 [drp] | OPHTHALMIC | Status: DC
  Filled 2014-05-04: qty 5

## 2014-05-04 MED ORDER — CLONAZEPAM 0.5 MG PO TABS: 0.2500 mg | ORAL_TABLET | Freq: Every day | ORAL | Status: DC

## 2014-05-04 MED ORDER — SENNOSIDES-DOCUSATE SODIUM 8.6-50 MG PO TABS
1.0000 | ORAL_TABLET | Freq: Every evening | ORAL | Status: DC | PRN
  Filled 2014-05-04: qty 1

## 2014-05-04 MED ORDER — FLEET ENEMA 7-19 GM/118ML RE ENEM: 1.0000 | ENEMA | Freq: Once | RECTAL | Status: AC | PRN

## 2014-05-04 MED ORDER — SUCCINYLCHOLINE CHLORIDE 20 MG/ML IJ SOLN
INTRAMUSCULAR | Status: AC
  Filled 2014-05-04: qty 1

## 2014-05-04 MED ORDER — PRAZOSIN HCL 2 MG PO CAPS
2.0000 mg | ORAL_CAPSULE | Freq: Three times a day (TID) | ORAL | Status: DC
  Administered 2014-05-04 – 2014-05-05 (×4): 2 mg via ORAL
  Filled 2014-05-04 (×8): qty 1

## 2014-05-04 MED ORDER — LIDOCAINE HCL (CARDIAC) 20 MG/ML IV SOLN
INTRAVENOUS | Status: AC
  Filled 2014-05-04: qty 5

## 2014-05-04 MED ORDER — ALUM & MAG HYDROXIDE-SIMETH 200-200-20 MG/5ML PO SUSP: 30.0000 mL | Freq: Four times a day (QID) | ORAL | Status: DC | PRN

## 2014-05-04 MED ORDER — ONDANSETRON HCL 4 MG/2ML IJ SOLN
INTRAMUSCULAR | Status: DC | PRN
  Administered 2014-05-04: 4 mg via INTRAVENOUS

## 2014-05-04 MED ORDER — ONDANSETRON HCL 4 MG/2ML IJ SOLN: 4.0000 mg | INTRAMUSCULAR | Status: DC | PRN

## 2014-05-04 MED ORDER — SODIUM CHLORIDE 0.9 % IV SOLN: INTRAVENOUS | Status: DC

## 2014-05-04 MED ORDER — THROMBIN 20000 UNITS EX KIT
PACK | CUTANEOUS | Status: DC | PRN
  Administered 2014-05-04: 20 mL via TOPICAL

## 2014-05-04 MED ORDER — MIDAZOLAM HCL 5 MG/5ML IJ SOLN
INTRAMUSCULAR | Status: DC | PRN
  Administered 2014-05-04: 2 mg via INTRAVENOUS

## 2014-05-04 MED ORDER — HYDROMORPHONE HCL 1 MG/ML IJ SOLN
INTRAMUSCULAR | Status: AC
  Filled 2014-05-04: qty 1

## 2014-05-04 MED ORDER — CALCIUM CARBONATE 1250 (500 CA) MG PO CHEW
2500.0000 mg | CHEWABLE_TABLET | Freq: Every day | ORAL | Status: DC
  Filled 2014-05-04: qty 2

## 2014-05-04 MED ORDER — SODIUM CHLORIDE 0.9 % IJ SOLN
3.0000 mL | Freq: Two times a day (BID) | INTRAMUSCULAR | Status: DC
  Administered 2014-05-04 – 2014-05-05 (×4): 3 mL via INTRAVENOUS

## 2014-05-04 MED ORDER — CEFAZOLIN SODIUM-DEXTROSE 2-3 GM-% IV SOLR
INTRAVENOUS | Status: AC
  Administered 2014-05-04: 2 g via INTRAVENOUS
  Filled 2014-05-04: qty 50

## 2014-05-04 MED ORDER — PHENYLEPHRINE HCL 10 MG/ML IJ SOLN
INTRAMUSCULAR | Status: DC | PRN
  Administered 2014-05-04 (×2): 80 ug via INTRAVENOUS

## 2014-05-04 MED ORDER — EPHEDRINE SULFATE 50 MG/ML IJ SOLN
INTRAMUSCULAR | Status: AC
  Filled 2014-05-04: qty 1

## 2014-05-04 MED ORDER — ONDANSETRON HCL 4 MG/2ML IJ SOLN
INTRAMUSCULAR | Status: AC
  Filled 2014-05-04: qty 2

## 2014-05-04 MED ORDER — CHLORTHALIDONE 25 MG PO TABS
25.0000 mg | ORAL_TABLET | Freq: Every day | ORAL | Status: DC
  Administered 2014-05-04 – 2014-05-05 (×2): 25 mg via ORAL
  Filled 2014-05-04 (×3): qty 1

## 2014-05-04 MED ORDER — ACETAMINOPHEN 325 MG PO TABS: 325.0000 mg | ORAL_TABLET | ORAL | Status: DC | PRN

## 2014-05-04 MED ORDER — OXYCODONE HCL 5 MG PO TABS
5.0000 mg | ORAL_TABLET | Freq: Once | ORAL | Status: AC | PRN
  Administered 2014-05-04: 5 mg via ORAL

## 2014-05-04 MED ORDER — ACETAMINOPHEN 325 MG PO TABS: 650.0000 mg | ORAL_TABLET | ORAL | Status: DC | PRN

## 2014-05-04 MED ORDER — 0.9 % SODIUM CHLORIDE (POUR BTL) OPTIME
TOPICAL | Status: DC | PRN
  Administered 2014-05-04: 1000 mL

## 2014-05-04 MED ORDER — FENTANYL CITRATE 0.05 MG/ML IJ SOLN
INTRAMUSCULAR | Status: AC
  Filled 2014-05-04: qty 5

## 2014-05-04 MED ORDER — AMLODIPINE BESYLATE 5 MG PO TABS
5.0000 mg | ORAL_TABLET | Freq: Every day | ORAL | Status: DC
  Administered 2014-05-04 – 2014-05-05 (×2): 5 mg via ORAL
  Filled 2014-05-04 (×3): qty 1

## 2014-05-04 MED ORDER — ZOLPIDEM TARTRATE 5 MG PO TABS: 5.0000 mg | ORAL_TABLET | Freq: Every evening | ORAL | Status: DC | PRN

## 2014-05-04 MED ORDER — VANCOMYCIN HCL 10 G IV SOLR
1500.0000 mg | Freq: Once | INTRAVENOUS | Status: AC
  Administered 2014-05-04: 1500 mg via INTRAVENOUS
  Filled 2014-05-04 (×2): qty 1500

## 2014-05-04 MED ORDER — GLYCOPYRROLATE 0.2 MG/ML IJ SOLN
INTRAMUSCULAR | Status: DC | PRN
  Administered 2014-05-04: .8 mg via INTRAVENOUS

## 2014-05-04 MED ORDER — SODIUM CHLORIDE 0.9 % IJ SOLN: 3.0000 mL | INTRAMUSCULAR | Status: DC | PRN

## 2014-05-04 MED ORDER — OXYCODONE-ACETAMINOPHEN 5-325 MG PO TABS
1.0000 | ORAL_TABLET | ORAL | Status: DC | PRN
  Administered 2014-05-04 – 2014-05-06 (×5): 2 via ORAL
  Filled 2014-05-04 (×5): qty 2

## 2014-05-04 MED ORDER — PROPOFOL 10 MG/ML IV BOLUS
INTRAVENOUS | Status: AC
  Filled 2014-05-04: qty 20

## 2014-05-04 MED ORDER — ZOLPIDEM TARTRATE 5 MG PO TABS
10.0000 mg | ORAL_TABLET | Freq: Every day | ORAL | Status: DC
  Administered 2014-05-04 – 2014-05-05 (×2): 10 mg via ORAL
  Filled 2014-05-04 (×2): qty 2

## 2014-05-04 MED ORDER — THROMBIN 20000 UNITS EX SOLR
CUTANEOUS | Status: AC
  Filled 2014-05-04: qty 20000

## 2014-05-04 MED ORDER — BISACODYL 5 MG PO TBEC
5.0000 mg | DELAYED_RELEASE_TABLET | Freq: Every day | ORAL | Status: DC | PRN
  Filled 2014-05-04: qty 1

## 2014-05-04 MED ORDER — PHENOL 1.4 % MT LIQD
1.0000 | OROMUCOSAL | Status: DC | PRN
Start: 2014-05-04 — End: 2014-05-06

## 2014-05-04 MED ORDER — LATANOPROST 0.005 % OP SOLN
1.0000 [drp] | Freq: Every day | OPHTHALMIC | Status: DC
  Filled 2014-05-04: qty 2.5

## 2014-05-04 MED ORDER — ACETAMINOPHEN 650 MG RE SUPP: 650.0000 mg | RECTAL | Status: DC | PRN

## 2014-05-04 MED ORDER — MENTHOL 3 MG MT LOZG: 1.0000 | LOZENGE | OROMUCOSAL | Status: DC | PRN

## 2014-05-04 MED ORDER — GABAPENTIN 300 MG PO CAPS
300.0000 mg | ORAL_CAPSULE | Freq: Three times a day (TID) | ORAL | Status: DC
  Administered 2014-05-04 – 2014-05-05 (×5): 300 mg via ORAL
  Filled 2014-05-04 (×8): qty 1

## 2014-05-04 MED ORDER — DEXAMETHASONE SODIUM PHOSPHATE 10 MG/ML IJ SOLN
INTRAMUSCULAR | Status: DC | PRN
  Administered 2014-05-04: 10 mg via INTRAVENOUS

## 2014-05-04 MED ORDER — LABETALOL HCL 5 MG/ML IV SOLN
INTRAVENOUS | Status: DC | PRN
  Administered 2014-05-04: 7.5 mg via INTRAVENOUS

## 2014-05-04 MED ORDER — CALCIUM CARBONATE 1250 (500 CA) MG PO TABS
2.0000 | ORAL_TABLET | Freq: Every day | ORAL | Status: DC
  Administered 2014-05-05: 1000 mg via ORAL
  Filled 2014-05-04 (×3): qty 2

## 2014-05-04 MED ORDER — VANCOMYCIN HCL IN DEXTROSE 1-5 GM/200ML-% IV SOLN
1000.0000 mg | Freq: Once | INTRAVENOUS | Status: DC
  Filled 2014-05-04: qty 200

## 2014-05-04 MED ORDER — PROPOFOL 10 MG/ML IV BOLUS
INTRAVENOUS | Status: DC | PRN
  Administered 2014-05-04: 140 mg via INTRAVENOUS
  Administered 2014-05-04: 40 mg via INTRAVENOUS

## 2014-05-04 MED ORDER — OXYCODONE HCL 5 MG PO TABS
ORAL_TABLET | ORAL | Status: AC
  Filled 2014-05-04: qty 1

## 2014-05-04 MED ORDER — BUPROPION HCL ER (XL) 150 MG PO TB24
150.0000 mg | ORAL_TABLET | Freq: Every day | ORAL | Status: DC
  Administered 2014-05-04 – 2014-05-05 (×2): 150 mg via ORAL
  Filled 2014-05-04 (×3): qty 1

## 2014-05-04 MED ORDER — HYDROMORPHONE HCL 1 MG/ML IJ SOLN: 0.5000 mg | INTRAMUSCULAR | Status: DC | PRN

## 2014-05-04 MED ORDER — ROCURONIUM BROMIDE 50 MG/5ML IV SOLN
INTRAVENOUS | Status: AC
  Filled 2014-05-04: qty 1

## 2014-05-04 MED ORDER — ROCURONIUM BROMIDE 100 MG/10ML IV SOLN
INTRAVENOUS | Status: DC | PRN
  Administered 2014-05-04: 10 mg via INTRAVENOUS
  Administered 2014-05-04 (×2): 20 mg via INTRAVENOUS
  Administered 2014-05-04: 40 mg via INTRAVENOUS
  Administered 2014-05-04 (×2): 20 mg via INTRAVENOUS

## 2014-05-04 MED ORDER — DIAZEPAM 5 MG PO TABS
ORAL_TABLET | ORAL | Status: AC
  Filled 2014-05-04: qty 1

## 2014-05-04 MED ORDER — FENTANYL CITRATE 0.05 MG/ML IJ SOLN
INTRAMUSCULAR | Status: DC | PRN
  Administered 2014-05-04: 100 ug via INTRAVENOUS
  Administered 2014-05-04 (×3): 50 ug via INTRAVENOUS

## 2014-05-04 MED ORDER — VITAMIN D 50 MCG (2000 UT) PO CAPS
1.0000 | ORAL_CAPSULE | Freq: Every day | ORAL | Status: DC
Start: 2014-05-04 — End: 2014-05-06
  Administered 2014-05-04 – 2014-05-05 (×2): 2000 [IU] via ORAL
  Filled 2014-05-04 (×3): qty 1

## 2014-05-04 MED ORDER — ACETAMINOPHEN 160 MG/5ML PO SOLN
325.0000 mg | ORAL | Status: DC | PRN
Start: 2014-05-04 — End: 2014-05-04
  Filled 2014-05-04: qty 20.3

## 2014-05-04 MED ORDER — OXYCODONE HCL 5 MG/5ML PO SOLN
5.0000 mg | Freq: Once | ORAL | Status: AC | PRN
Start: 2014-05-04 — End: 2014-05-04

## 2014-05-04 MED ORDER — LACTATED RINGERS IV SOLN
INTRAVENOUS | Status: DC | PRN
  Administered 2014-05-04 (×4): via INTRAVENOUS

## 2014-05-04 MED ORDER — SODIUM CHLORIDE 0.9 % IJ SOLN
INTRAMUSCULAR | Status: AC
  Filled 2014-05-04: qty 10

## 2014-05-04 MED ORDER — HYDROMORPHONE HCL 1 MG/ML IJ SOLN
0.2500 mg | INTRAMUSCULAR | Status: DC | PRN
  Administered 2014-05-04 (×7): 0.5 mg via INTRAVENOUS

## 2014-05-04 MED ORDER — NEOSTIGMINE METHYLSULFATE 10 MG/10ML IV SOLN
INTRAVENOUS | Status: DC | PRN
Start: 2014-05-04 — End: 2014-05-04
  Administered 2014-05-04: 5 mg via INTRAVENOUS

## 2014-05-04 MED ORDER — GLUCOSAMINE-CHONDROITIN 500-400 MG PO TABS: 2.0000 | ORAL_TABLET | Freq: Every day | ORAL | Status: DC

## 2014-05-04 MED ORDER — HYDROMORPHONE HCL 1 MG/ML IJ SOLN
INTRAMUSCULAR | Status: AC
  Filled 2014-05-04: qty 2

## 2014-05-04 MED ORDER — PANTOPRAZOLE SODIUM 40 MG PO TBEC
40.0000 mg | DELAYED_RELEASE_TABLET | Freq: Every day | ORAL | Status: DC
  Administered 2014-05-04 – 2014-05-05 (×2): 40 mg via ORAL
  Filled 2014-05-04: qty 1

## 2014-05-04 SURGICAL SUPPLY — 99 items
APPLIER CLIP 11 MED OPEN (CLIP) ×3
APPLIER CLIP 9.375 SM OPEN (CLIP) ×3
BENZOIN TINCTURE PRP APPL 2/3 (GAUZE/BANDAGES/DRESSINGS) ×3 IMPLANT
BLADE SURG 10 STRL SS (BLADE) ×6 IMPLANT
BLADE SURG ROTATE 9660 (MISCELLANEOUS) IMPLANT
CAGE COUGAR LG 14 10 (Cage) ×2 IMPLANT
CAGE COUGAR LG 14MM 10 (Cage) ×1 IMPLANT
CLIP APPLIE 11 MED OPEN (CLIP) ×1 IMPLANT
CLIP APPLIE 9.375 SM OPEN (CLIP) ×1 IMPLANT
CLIP TI MEDIUM 24 (CLIP) IMPLANT
CLIP TI WIDE RED SMALL 24 (CLIP) IMPLANT
CLOSURE STERI-STRIP 1/2X4 (GAUZE/BANDAGES/DRESSINGS) ×1
CLOSURE WOUND 1/2 X4 (GAUZE/BANDAGES/DRESSINGS) ×1
CLSR STERI-STRIP ANTIMIC 1/2X4 (GAUZE/BANDAGES/DRESSINGS) ×2 IMPLANT
CORDS BIPOLAR (ELECTRODE) IMPLANT
COVER SURGICAL LIGHT HANDLE (MISCELLANEOUS) ×3 IMPLANT
COVER TABLE BACK 60X90 (DRAPES) ×3 IMPLANT
DRAPE C-ARM 42X72 X-RAY (DRAPES) ×3 IMPLANT
DRAPE INCISE IOBAN 66X45 STRL (DRAPES) IMPLANT
DRAPE POUCH INSTRU U-SHP 10X18 (DRAPES) ×3 IMPLANT
DRAPE SURG 17X23 STRL (DRAPES) ×12 IMPLANT
DRSG MEPILEX BORDER 4X12 (GAUZE/BANDAGES/DRESSINGS) IMPLANT
DURAPREP 26ML APPLICATOR (WOUND CARE) ×3 IMPLANT
ELECT BLADE 4.0 EZ CLEAN MEGAD (MISCELLANEOUS) ×3
ELECT BLADE 6.5 EXT (BLADE) ×3 IMPLANT
ELECT CAUTERY BLADE 6.4 (BLADE) ×3 IMPLANT
ELECT REM PT RETURN 9FT ADLT (ELECTROSURGICAL) ×3
ELECTRODE BLDE 4.0 EZ CLN MEGD (MISCELLANEOUS) ×1 IMPLANT
ELECTRODE REM PT RTRN 9FT ADLT (ELECTROSURGICAL) ×1 IMPLANT
GAUZE SPONGE 4X4 16PLY XRAY LF (GAUZE/BANDAGES/DRESSINGS) IMPLANT
GLOVE BIO SURGEON STRL SZ7 (GLOVE) ×3 IMPLANT
GLOVE BIO SURGEON STRL SZ8 (GLOVE) ×3 IMPLANT
GLOVE BIOGEL PI IND STRL 7.0 (GLOVE) ×1 IMPLANT
GLOVE BIOGEL PI IND STRL 7.5 (GLOVE) ×1 IMPLANT
GLOVE BIOGEL PI IND STRL 8 (GLOVE) ×2 IMPLANT
GLOVE BIOGEL PI INDICATOR 7.0 (GLOVE) ×2
GLOVE BIOGEL PI INDICATOR 7.5 (GLOVE) ×2
GLOVE BIOGEL PI INDICATOR 8 (GLOVE) ×4
GLOVE SURG SS PI 7.5 STRL IVOR (GLOVE) ×3 IMPLANT
GOWN STRL REUS W/ TWL LRG LVL3 (GOWN DISPOSABLE) ×2 IMPLANT
GOWN STRL REUS W/ TWL XL LVL3 (GOWN DISPOSABLE) ×2 IMPLANT
GOWN STRL REUS W/TWL LRG LVL3 (GOWN DISPOSABLE) ×4
GOWN STRL REUS W/TWL XL LVL3 (GOWN DISPOSABLE) ×4
HEMOSTAT SURGICEL 2X14 (HEMOSTASIS) IMPLANT
INSERT FOGARTY 61MM (MISCELLANEOUS) IMPLANT
INSERT FOGARTY SM (MISCELLANEOUS) IMPLANT
KIT BASIN OR (CUSTOM PROCEDURE TRAY) ×3 IMPLANT
KIT ROOM TURNOVER OR (KITS) ×6 IMPLANT
LIQUID BAND (GAUZE/BANDAGES/DRESSINGS) ×3 IMPLANT
LOOP VESSEL MAXI BLUE (MISCELLANEOUS) IMPLANT
LOOP VESSEL MINI RED (MISCELLANEOUS) IMPLANT
MIX DBX 10CC 35% BONE (Bone Implant) ×3 IMPLANT
NEEDLE HYPO 25GX1X1/2 BEV (NEEDLE) ×3 IMPLANT
NEEDLE SPNL 18GX3.5 QUINCKE PK (NEEDLE) ×3 IMPLANT
NS IRRIG 1000ML POUR BTL (IV SOLUTION) ×3 IMPLANT
PACK LAMINECTOMY ORTHO (CUSTOM PROCEDURE TRAY) ×3 IMPLANT
PACK UNIVERSAL I (CUSTOM PROCEDURE TRAY) ×3 IMPLANT
PAD ARMBOARD 7.5X6 YLW CONV (MISCELLANEOUS) ×6 IMPLANT
PIN FIXATION AEGIS THREADED (PIN) ×6 IMPLANT
PLATE AEGIS ANT LUMBAR 23MM (Plate) ×3 IMPLANT
SCREW AEGIS 24MM (Screw) ×12 IMPLANT
SPONGE GAUZE 4X4 12PLY STER LF (GAUZE/BANDAGES/DRESSINGS) ×3 IMPLANT
SPONGE INTESTINAL PEANUT (DISPOSABLE) ×3 IMPLANT
SPONGE LAP 18X18 X RAY DECT (DISPOSABLE) IMPLANT
SPONGE LAP 4X18 X RAY DECT (DISPOSABLE) IMPLANT
SPONGE SURGIFOAM ABS GEL 100 (HEMOSTASIS) ×3 IMPLANT
STRIP CLOSURE SKIN 1/2X4 (GAUZE/BANDAGES/DRESSINGS) ×2 IMPLANT
SURGIFLO TRUKIT (HEMOSTASIS) IMPLANT
SUT MNCRL AB 4-0 PS2 18 (SUTURE) ×3 IMPLANT
SUT PDS AB 1 CTX 36 (SUTURE) ×3 IMPLANT
SUT PROLENE 4 0 RB 1 (SUTURE)
SUT PROLENE 4-0 RB1 .5 CRCL 36 (SUTURE) IMPLANT
SUT PROLENE 5 0 C 1 24 (SUTURE) IMPLANT
SUT PROLENE 5 0 CC1 (SUTURE) IMPLANT
SUT PROLENE 6 0 C 1 30 (SUTURE) IMPLANT
SUT PROLENE 6 0 CC (SUTURE) IMPLANT
SUT SILK 0 TIES 10X30 (SUTURE) IMPLANT
SUT SILK 2 0 TIES 10X30 (SUTURE) ×3 IMPLANT
SUT SILK 2 0SH CR/8 30 (SUTURE) IMPLANT
SUT SILK 3 0 TIES 10X30 (SUTURE) ×3 IMPLANT
SUT SILK 3 0SH CR/8 30 (SUTURE) IMPLANT
SUT VIC AB 0 CT1 27 (SUTURE)
SUT VIC AB 0 CT1 27XBRD ANBCTR (SUTURE) IMPLANT
SUT VIC AB 1 CT1 27 (SUTURE)
SUT VIC AB 1 CT1 27XBRD ANBCTR (SUTURE) IMPLANT
SUT VIC AB 1 CTX 36 (SUTURE)
SUT VIC AB 1 CTX36XBRD ANBCTR (SUTURE) IMPLANT
SUT VIC AB 2-0 CT1 27 (SUTURE)
SUT VIC AB 2-0 CT1 TAPERPNT 27 (SUTURE) IMPLANT
SUT VIC AB 2-0 CT2 18 VCP726D (SUTURE) ×3 IMPLANT
SUT VIC AB 3-0 SH 27 (SUTURE)
SUT VIC AB 3-0 SH 27X BRD (SUTURE) IMPLANT
SYR BULB IRRIGATION 50ML (SYRINGE) ×3 IMPLANT
TAPE CLOTH SURG 6X10 WHT LF (GAUZE/BANDAGES/DRESSINGS) ×3 IMPLANT
TOWEL OR 17X24 6PK STRL BLUE (TOWEL DISPOSABLE) ×3 IMPLANT
TOWEL OR 17X26 10 PK STRL BLUE (TOWEL DISPOSABLE) ×3 IMPLANT
TRAY FOLEY CATH 16FR SILVER (SET/KITS/TRAYS/PACK) ×3 IMPLANT
WATER STERILE IRR 1000ML POUR (IV SOLUTION) IMPLANT
YANKAUER SUCT BULB TIP NO VENT (SUCTIONS) ×3 IMPLANT

## 2014-05-04 NOTE — Progress Notes (Signed)
ANTIBIOTIC CONSULT NOTE - INITIAL  Pharmacy Consult for vancomycin Indication: s/p spinal surgery  Allergies  Allergen Reactions  . Lyrica [Pregabalin] Anaphylaxis  . Amitriptyline     Weird feeling  . Arthrotec [Diclofenac-Misoprostol] Diarrhea  . Augmentin [Amoxicillin-Pot Clavulanate] Diarrhea  . Cephalexin Diarrhea  . Ciprofloxacin Swelling    Tounge  . Crestor [Rosuvastatin]     Leg pain   . Darvon [Propoxyphene] Diarrhea  . Duract [Bromfenac] Diarrhea  . Erythromycin Diarrhea  . Flexeril [Cyclobenzaprine] Swelling    Feet   . Lescol [Fluvastatin]     Leg pain  . Levaquin [Levofloxacin]   . Meloxicam Swelling    Feet   . Mirapex [Pramipexole] Nausea And Vomiting  . Nexium [Esomeprazole] Diarrhea  . Pravastatin     Leg pain  . Prempro [Conj Estrog-Medroxyprogest Ace]     Could not take  . Reglan [Metoclopramide] Diarrhea  . Statins     Leg pain   . Sulfur Diarrhea  . Tramadol Diarrhea  . Vioxx [Rofecoxib] Diarrhea  . Zoloft [Sertraline Hcl]     Sensitivity with teeth  . Metaxalone Rash    Flushing of the skin    Patient Measurements: Height: 5' 5.5" (166.4 cm) Weight: 259 lb (117.482 kg) IBW/kg (Calculated) : 58.15   Vital Signs: Temp: 98.6 F (37 C) (02/25 1359) Temp Source: Oral (02/25 0552) BP: 173/54 mmHg (02/25 1359) Pulse Rate: 79 (02/25 1359) Intake/Output from previous day:   Intake/Output from this shift: Total I/O In: 3620 [P.O.:120; I.V.:3500] Out: 1400 [Urine:1300; Blood:100]  Labs: No results for input(s): WBC, HGB, PLT, LABCREA, CREATININE in the last 72 hours. Estimated Creatinine Clearance: 79.2 mL/min (by C-G formula based on Cr of 0.83). No results for input(s): VANCOTROUGH, VANCOPEAK, VANCORANDOM, GENTTROUGH, GENTPEAK, GENTRANDOM, TOBRATROUGH, TOBRAPEAK, TOBRARND, AMIKACINPEAK, AMIKACINTROU, AMIKACIN in the last 72 hours.   Microbiology: Recent Results (from the past 720 hour(s))  Surgical pcr screen     Status: None   Collection Time: 04/25/14 12:05 PM  Result Value Ref Range Status   MRSA, PCR NEGATIVE NEGATIVE Final   Staphylococcus aureus NEGATIVE NEGATIVE Final    Comment:        The Xpert SA Assay (FDA approved for NASAL specimens in patients over 48 years of age), is one component of a comprehensive surveillance program.  Test performance has been validated by [facility] for patients greater than or equal to 51 year old. It is not intended to diagnose infection nor to guide or monitor treatment.     Medical History: Past Medical History  Diagnosis Date  . Hypertension   . Sleep apnea     USES CPAP   . Bipolar disorder   . GERD (gastroesophageal reflux disease)   . Arthritis   . Cancer     SKIN    ,    LEFT BREAST     Medications:  See med rec  Assessment: Patient is a 74 y.o F s/p lumbar fusion today.  She received ancef 2gm IV x1 pre-op.  To start vancomycin for post-op prophylaxis.  RN reported that patient does not have a drain.   Goal of Therapy:  Vancomycin trough level 15-20 mcg/ml  Plan:  - vancomycin 1500mg  IV x1 - pharmacy will sign off.  Re-consult [place] if need further assistance.  Savayah Waltrip P 05/04/2014,2:46 PM

## 2014-05-04 NOTE — Op Note (Signed)
    Patient name: Karen Castillo MRN: [id number] DOB: 09-12-1940 Sex: female  05/04/2014 Pre-operative Diagnosis: Degenerative back disease Post-operative diagnosis:  Same Surgeon:  Eldridge Abrahams Co-surgeon:  Phylliss Bob Procedure:   Anterior exposure, L5-S1 Anesthesia:  Gen. Blood Loss:  See anesthesia record Specimens:  None  Findings:  Normal anatomy.  Iliac vessels were not calcified  Indications:  The patient has been seen and evaluated and felt to be a good candidate for anterior exposure to treat her back pain and nonunion.  I saw her preoperatively and discussed the risks and benefits.  She is here today for her procedure.  Procedure:  The patient was identified in the holding area and taken to [place]  The patient was then placed supine on the table. general anesthesia was administered.  The patient was prepped and draped in the usual sterile fashion.  A time out was called and antibiotics were administered.  Fluoroscopy was used to confirm the L5-S1 disc space and the appropriate level of the skin incision.  A transverse left lower quadrant incision was made from midline out lateral for approximately 8 cm.  Cautery was used to divide subcutaneous tissue down to the fascia which was skeletonized.  The fascia was then divided with cautery.  Subfascial flaps were created.  I then entered the retroperitoneal space, lateral to the rectus muscle.  I identified the iliac artery .  This was essentially disease free.  The ureter was identified and then mobilized lateral to the artery.  I skeletonized the artery.  The vein was then identified.  2 iliolumbar veins were divided between silk ties.  Next, the [facility] retractor was prepared.  I tried to go medial to the rectus muscle however an adequate plane could not be identified and therefore I felt exposure through the lateral side of the rectus was acceptable.  A 200 reversed lip blade was placed on the right side of the spine and a  150 reversed lip blade was placed on the left side.  Malleable retractors were placed proximally and distally.  Fluoroscopy confirmed the appropriate level was exposed.  At this point, Dr. Lynann Bologna came in and performed his portion of the procedure.  Please see his detailed operative note.  He also closed the incision.   Disposition:  To PACU in stable condition.   Theotis Burrow, M.D. [facility] [place] Office: [phone] Pager:  [phone]

## 2014-05-04 NOTE — Anesthesia Postprocedure Evaluation (Signed)
  Anesthesia Post-op Note  Patient: Karen Castillo  Procedure(s) Performed: Procedure(s) with comments: ANTERIOR LUMBAR FUSION 1 LEVEL (N/A) - Lumbar 5-sacrum 1 anterior lumbar interbody fusion with instrumentation and allograft ABDOMINAL EXPOSURE (N/A)  Patient Location: PACU  Anesthesia Type:General  Level of Consciousness: awake  Airway and Oxygen Therapy: Patient Spontanous Breathing  Post-op Pain: moderate  Post-op Assessment: Post-op Vital signs reviewed, Patient's Cardiovascular Status Stable, Respiratory Function Stable, Patent Airway, No signs of Nausea or vomiting and Pain level controlled  Post-op Vital Signs: Reviewed and stable  Last Vitals:  Filed Vitals:   05/04/14 1330  BP: 186/64  Pulse: 71  Temp:   Resp: 12    Complications: No apparent anesthesia complications

## 2014-05-04 NOTE — Progress Notes (Signed)
RT set up CPAP for patient on 8 cmH20. Pt has home nasal prongs. Pt to call when ready to be placed on.

## 2014-05-04 NOTE — Progress Notes (Signed)
Pain 7/10 after 2mg  IV Dilaudid, po Valium & Oxy IR. Dr Ermalene Postin updated-new order for up to 2mg  more additional Dilaudid if needed for cont pai. Will cont to monitor closely.

## 2014-05-04 NOTE — H&P (View-Only) (Signed)
Patient name: Karen Castillo MRN: [id number] DOB: 1940-12-18 Sex: female   Referred by: Dr. Lynann Bologna  Reason for referral:  Chief Complaint  Patient presents with  . New Evaluation    consult prior to ALIF    HISTORY OF PRESENT ILLNESS: The patient is here today for preoperative evaluation.  She is scheduled to undergo an anterior approach for L5-S1 instrumentation.  She has undergone multiple back surgeries from a posterior approach and continues to have persistent pain.  Patient is medically managed for hypertension.  She denies a history of smoking.  Her only abdominal surgeries have been a bilateral tubal ligation many years ago and a laparoscopic cholecystectomy.  Past Medical History  Diagnosis Date  . Hypertension   . Sleep apnea     USES CPAP   . Bipolar disorder   . GERD (gastroesophageal reflux disease)   . Arthritis   . Cancer     SKIN    ,    LEFT BREAST     Past Surgical History  Procedure Laterality Date  . Breast surgery      LEFT BRREAST BX  . Tubal ligation    . Carpal tunnel release      1994 RT  . Leep      [id number]  . Cholecystectomy      2004  . Ercp      2009  . Colonoscopy w/ biopsies    . Skin cancer excision      X 4   . Back surgery      2010 ,2015LUMB FUSION, 06/2013 [facility] FUSION    History   Social History  . Marital Status: Widowed    Spouse Name: N/A  . Number of Children: N/A  . Years of Education: N/A   Occupational History  . Not on file.   Social History Main Topics  . Smoking status: Never Smoker   . Smokeless tobacco: Not on file  . Alcohol Use: No  . Drug Use: No  . Sexual Activity: Not on file   Other Topics Concern  . Not on file   Social History Narrative    No family history on file.  Allergies as of 05/01/2014 - Review Complete 05/01/2014  Allergen Reaction Noted  . Lyrica [pregabalin] Anaphylaxis 04/20/2014  . Amitriptyline  04/20/2014  . Arthrotec [diclofenac-misoprostol] Diarrhea  04/20/2014  . Augmentin [amoxicillin-pot clavulanate] Diarrhea 04/20/2014  . Cephalexin Diarrhea 04/20/2014  . Ciprofloxacin Swelling 04/20/2014  . Crestor [rosuvastatin]  04/20/2014  . Darvon [propoxyphene] Diarrhea 04/20/2014  . Duract [bromfenac] Diarrhea 04/20/2014  . Erythromycin Diarrhea 04/20/2014  . Flexeril [cyclobenzaprine] Swelling 04/20/2014  . Lescol [fluvastatin]  04/20/2014  . Levaquin [levofloxacin]  04/20/2014  . Meloxicam Swelling 04/20/2014  . Mirapex [pramipexole] Nausea And Vomiting 04/20/2014  . Nexium [esomeprazole] Diarrhea 04/20/2014  . Pravastatin  04/20/2014  . Prempro [conj estrog-medroxyprogest ace]  04/20/2014  . Reglan [metoclopramide] Diarrhea 04/20/2014  . Statins  04/20/2014  . Sulfur Diarrhea 04/20/2014  . Tramadol Diarrhea 04/20/2014  . Vioxx [rofecoxib] Diarrhea 04/20/2014  . Zoloft [sertraline hcl]  04/20/2014  . Metaxalone Rash 04/20/2014    Current Outpatient Prescriptions on File Prior to Visit  Medication Sig Dispense Refill  . acetaminophen (TYLENOL) 500 MG tablet Take 1,000 mg by mouth daily as needed for mild pain or moderate pain.    Marland Kitchen amLODipine (NORVASC) 5 MG tablet Take 5 mg by mouth daily.    Marland Kitchen atenolol (TENORMIN) 100 MG tablet Take 100 mg  by mouth daily.    . B Complex-C (SUPER B COMPLEX PO) Take 1 tablet by mouth daily.    . Biotin 10 MG TABS Take 1 tablet by mouth daily.    Marland Kitchen buPROPion (WELLBUTRIN XL) 150 MG 24 hr tablet Take 150 mg by mouth daily.    . Calcium Carb-Cholecalciferol 600-800 MG-UNIT TABS Take 1 tablet by mouth daily.    . calcium carbonate (OS-CAL) 1250 (500 CA) MG chewable tablet Chew 2 tablets by mouth daily.    . chlorthalidone (HYGROTON) 25 MG tablet Take 25 mg by mouth daily.    . Cholecalciferol (VITAMIN D) 2000 UNITS CAPS Take 1 capsule by mouth daily.    . Coenzyme Q10 200 MG capsule Take 200 mg by mouth daily.    Marland Kitchen desvenlafaxine (PRISTIQ) 100 MG 24 hr tablet Take 100 mg by mouth daily.    Marland Kitchen gabapentin  (NEURONTIN) 300 MG capsule Take 300 mg by mouth 3 (three) times daily.    Marland Kitchen glucosamine-chondroitin 500-400 MG tablet Take 2 tablets by mouth daily.    Marland Kitchen HYDROcodone-acetaminophen (NORCO) 7.5-325 MG per tablet Take 1-2 tablets by mouth every 4 (four) hours as needed for moderate pain.    Marland Kitchen lansoprazole (PREVACID) 30 MG capsule Take 30 mg by mouth 2 (two) times daily before a meal.    . latanoprost (XALATAN) 0.005 % ophthalmic solution Place 1 drop into both eyes at bedtime.    . Magnesium 250 MG TABS Take 1 tablet by mouth daily.    . methocarbamol (ROBAXIN) 750 MG tablet Take 750 mg by mouth 4 (four) times daily.    . Multiple Vitamins-Minerals (COMPLETE MULTIVITAMIN/MINERAL PO) Take 1 tablet by mouth daily.    . Omega-3 Fatty Acids (FISH OIL) 1000 MG CAPS Take 1 capsule by mouth 3 (three) times daily.    . prazosin (MINIPRESS) 2 MG capsule Take 2 mg by mouth 3 (three) times daily.    . Probiotic Product (ALIGN PO) Take 1 capsule by mouth daily.    . timolol (BETIMOL) 0.5 % ophthalmic solution Place 1 drop into both eyes every morning.    . vitamin C (ASCORBIC ACID) 500 MG tablet Take 500 mg by mouth 2 (two) times daily.    . vitamin E 400 UNIT capsule Take 400 Units by mouth daily.    . Zinc 50 MG TABS Take 1 tablet by mouth daily.    Marland Kitchen zolpidem (AMBIEN) 10 MG tablet Take 10 mg by mouth at bedtime.    . clonazePAM (KLONOPIN) 0.5 MG tablet Take 0.25 mg by mouth daily.     No current facility-administered medications on file prior to visit.     REVIEW OF SYSTEMS: Cardiovascular: No chest pain, chest pressure, palpitations, orthopnea, or dyspnea on exertion. No claudication or rest pain,  No history of DVT or phlebitis. Pulmonary: No productive cough, asthma or wheezing. Neurologic: No weakness, paresthesias, aphasia, or amaurosis. No dizziness. Hematologic: No bleeding problems or clotting disorders. Musculoskeletal: Chronic back pain. Gastrointestinal: No blood in stool or  hematemesis Genitourinary: No dysuria or hematuria. Psychiatric:: No history of major depression. Integumentary: No rashes or ulcers. Constitutional: No fever or chills.  PHYSICAL EXAMINATION: General: The patient appears their stated age.  Vital signs are BP 146/63 mmHg  Pulse 74  Ht 5' 5.5" (1.664 m)  Wt 259 lb (117.482 kg)  BMI 42.43 kg/m2  SpO2 95% HEENT:  No gross abnormalities Pulmonary: Respirations are non-labored Abdomen: Soft and non-tender, obese  Musculoskeletal: There are no  major deformities.   Neurologic: No focal weakness or paresthesias are detected, Skin: There are no ulcer or rashes noted. Psychiatric: The patient has normal affect. Cardiovascular: Palpable bilateral dorsalis pedis and posterior tibial arteries.  Palpable left femoral pulse.  Diagnostic Studies: I have reviewed her MRI and x-rays.  No significant arterial calcification is noted via x-ray.   Assessment:  Degenerative back disease Plan: I feel it is reasonable to proceed with anterior approach for L5-S1 instrumentation.  I discussed the risks and benefits of the anterior approach including injury to the artery vein and ureter.  We also discussed the possibility of wound complications and delayed bowel function.  Her operation is been scheduled for this Thursday.     Eldridge Abrahams, M.D. [facility] [place] Office: [phone] Pager:  [phone]

## 2014-05-04 NOTE — OR Nursing (Signed)
1155  Received results of local abdomen xray.  Xray negative per Radiologist.

## 2014-05-04 NOTE — Interval H&P Note (Signed)
History and Physical Interval Note:  05/04/2014 7:24 AM  Karen Castillo  has presented today for surgery, with the diagnosis of Left leg pain  The various methods of treatment have been discussed with the patient and family. After consideration of risks, benefits and other options for treatment, the patient has consented to  Procedure(s) with comments: ANTERIOR LUMBAR FUSION 1 LEVEL (N/A) - Lumbar 5-sacrum 1 anterior lumbar interbody fusion with instrumentation and allograft ABDOMINAL EXPOSURE (N/A) as a surgical intervention .  The patient's history has been reviewed, patient examined, no change in status, stable for surgery.  I have reviewed the patient's chart and labs.  Questions were answered to the patient's satisfaction.     Jaleesa Cervi IV, V. WELLS

## 2014-05-04 NOTE — Anesthesia Preprocedure Evaluation (Addendum)
Anesthesia Evaluation  Patient identified by MRN, date of birth, ID band Patient awake    Reviewed: Allergy & Precautions, NPO status , Patient's Chart, lab work & pertinent test results  History of Anesthesia Complications Negative for: history of anesthetic complications  Airway Mallampati: III  TM Distance: >3 FB Neck ROM: Full    Dental  (+) Teeth Intact   Pulmonary sleep apnea ,  breath sounds clear to auscultation        Cardiovascular hypertension, Pt. on medications and Pt. on home beta blockers - angina- Past MI and - CHF Rhythm:Regular     Neuro/Psych PSYCHIATRIC DISORDERS Bipolar Disorder  Neuromuscular disease    GI/Hepatic Neg liver ROS, GERD-  ,  Endo/Other    Renal/GU negative Renal ROS     Musculoskeletal  (+) Arthritis -,   Abdominal   Peds  Hematology negative hematology ROS (+)   Anesthesia Other Findings   Reproductive/Obstetrics                            Anesthesia Physical Anesthesia Plan  ASA: III  Anesthesia Plan: General   Post-op Pain Management:    Induction: Intravenous  Airway Management Planned: Oral ETT  Additional Equipment: None  Intra-op Plan:   Post-operative Plan: Extubation in OR  Informed Consent: I have reviewed the patients History and Physical, chart, labs and discussed the procedure including the risks, benefits and alternatives for the proposed anesthesia with the patient or authorized representative who has indicated his/her understanding and acceptance.   Dental advisory given  Plan Discussed with: CRNA and Surgeon  Anesthesia Plan Comments:        Anesthesia Quick Evaluation

## 2014-05-04 NOTE — Transfer of Care (Signed)
Immediate Anesthesia Transfer of Care Note  Patient: Karen Castillo  Procedure(s) Performed: Procedure(s) with comments: ANTERIOR LUMBAR FUSION 1 LEVEL (N/A) - Lumbar 5-sacrum 1 anterior lumbar interbody fusion with instrumentation and allograft ABDOMINAL EXPOSURE (N/A)  Patient Location: PACU  Anesthesia Type:General  Level of Consciousness: awake and alert   Airway & Oxygen Therapy: Patient Spontanous Breathing and Patient connected to nasal cannula oxygen  Post-op Assessment: Report given to RN and Post -op Vital signs reviewed and stable  Post vital signs: Reviewed and stable  Last Vitals:  Filed Vitals:   05/04/14 0552  BP: 194/54  Pulse: 70  Temp: 36.8 C  Resp: 20    Complications: No apparent anesthesia complications

## 2014-05-04 NOTE — Plan of Care (Signed)
Problem: Consults Goal: Diagnosis - Spinal Surgery Outcome: Completed/Met Date Met:  05/04/14 Thoraco/Lumbar Spine Fusion

## 2014-05-05 MED FILL — Thrombin For Soln 20000 Unit: CUTANEOUS | Qty: 1 | Status: AC

## 2014-05-05 NOTE — Evaluation (Signed)
Occupational Therapy Evaluation and Discharge Patient Details Name: Karen Castillo MRN: [id number] DOB: 21-Mar-1940 Today's Date: 05/05/2014    History of Present Illness Anterior lumbar interbody fusion, L5-S1. PMhx: s/p multiple back surgeries   Clinical Impression   This 74 yo female admitted and underwent above presents to acute OT with all education complete, we will sign off.    Follow Up Recommendations  No OT follow up    Equipment Recommendations  None recommended by OT       Precautions / Restrictions Precautions Precautions: Back Precaution Comments: Pt aware of back precautions from prior surgeries Required Braces or Orthoses: Spinal Brace Spinal Brace: Thoracolumbosacral orthotic;Applied in sitting position;Applied in standing position (can have off to go to bathroom) Restrictions Weight Bearing Restrictions: No      Mobility Bed Mobility Overal bed mobility: Needs Assistance Bed Mobility: Rolling;Sidelying to Sit Rolling: Modified independent (Device/Increase time) (use of rail) Sidelying to sit: Supervision          Transfers Overall transfer level: Needs assistance Equipment used: Rolling walker (2 wheeled) Transfers: Sit to/from Stand Sit to Stand: Supervision         General transfer comment: Ambulated down hallway to [employer] and back with RW         ADL Overall ADL's : Needs assistance/impaired Eating/Feeding: Independent;Sitting   Grooming: Set up;Supervision/safety;Sitting;Standing   Upper Body Bathing: Set up;Supervision/ safety;Sitting   Lower Body Bathing: Supervison/ safety;Set up;With adaptive equipment;Adhering to back precautions;Sit to/from stand   Upper Body Dressing : Set up;Supervision/safety;Sitting Upper Body Dressing Details (indicate cue type and reason): Min A to don TLSO brace at EOB Lower Body Dressing: Set up;Supervision/safety;With adaptive equipment;Cueing for back precautions;Sit to/from stand   Toilet  Transfer: Supervision/safety;Ambulation;RW (Bed>out and down hallway>back to room to sit in recliner)   Toileting- Clothing Manipulation and Hygiene: Supervision/safety;Set up;With adaptive equipment;Adhering to back precautions;Sit to/from stand         General ADL Comments: Pt aware to use wet wipes for toileting hygiene and uses a toilet aid. I padded her left lower abdominal wound with abd pads due to brace rubbing on it with sit<>stand with ambulation and we also found that when she was ambulating the brace would ride down so it worked better to put the left under arm strap over her shoulder instead. She was able to get brace on by herself except for top trunk strap (not the one under her arm). We did talk about maybe putting it over her head like it is a shirt with two strunk straps already strapped loosely to then see if she could do it all by herself.               Pertinent Vitals/Pain Pain Assessment: 0-10 Pain Score: 3  Pain Location: lower left abdomen where incision is Pain Intervention(s): Monitored during session;Repositioned (padded incision site due to brace rubbing)     Hand Dominance Right   Extremity/Trunk Assessment Upper Extremity Assessment Upper Extremity Assessment: Overall WFL for tasks assessed   Lower Extremity Assessment Lower Extremity Assessment: Defer to PT evaluation       Communication Communication Communication: No difficulties   Cognition Arousal/Alertness: Awake/alert Behavior During Therapy: WFL for tasks assessed/performed Overall Cognitive Status: Within Functional Limits for tasks assessed                                Home Living Family/patient expects to be discharged to::  Private residence Living Arrangements: Children Available Help at Discharge: Family;Available PRN/intermittently (but mainly by herself after one week) Type of Home: House Home Access: Stairs to enter [employer] of Steps:  1-1 Entrance Stairs-Rails: None Home Layout: One level     Bathroom Shower/Tub: Walk-in shower;Curtain         Home Equipment: Environmental consultant - 2 wheels;Bedside commode;Shower seat;Hand held shower head;Adaptive equipment Adaptive Equipment: Reacher;Sock aid;Long-handled shoe horn;Long-handled sponge (toilet aid)        Prior Functioning/Environment Level of Independence: Independent with assistive device(s)        Comments: Used a SPC occassionally    OT Diagnosis: Generalized weakness;Acute pain         OT Goals(Current goals can be found in the care plan section) Acute Rehab OT Goals Patient Stated Goal: home tomorrow  OT Frequency:                End of Session Equipment Utilized During Treatment: Rolling walker;Back brace Nurse Communication:  (NT: not sit for more than 44mins-1 hour at at time; left underarm strap over shoulder when ambulating)  Activity Tolerance: Patient tolerated treatment well Patient left: in chair;with call bell/phone within reach;with family/visitor present   Time: 0383-3383 OT Time Calculation (min): 40 min Charges:  OT General Charges $OT Visit: 1 Procedure OT Evaluation $Initial OT Evaluation Tier I: 1 Procedure OT Treatments $Self Care/Home Management : 23-37 mins  Almon Register [phone] 05/05/2014, 11:18 AM

## 2014-05-05 NOTE — Progress Notes (Signed)
    Subjective  - POD #1  The patient's pain has been well controlled overnight.  She has passed flatus.   Physical Exam:  Abdominal dressing is dry.  Her abdomen is soft. She has palpable left pedal pulses       Assessment/Plan:  POD #1  From an exposure standpoint, the patient is doing very well.  She has had return of bowel function.  From my perspective she is stable.  We'll defer to Dr. Lynann Bologna for disposition.  Please contact me should further assistance be required  Romuald Mccaslin IV, V. WELLS 05/05/2014 9:20 AM --  Filed Vitals:   05/05/14 0805  BP: 133/37  Pulse: 66  Temp: 98.2 F (36.8 C)  Resp: 18    Intake/Output Summary (Last 24 hours) at 05/05/14 0920 Last data filed at 05/05/14 0820  Gross per 24 hour  Intake   2340 ml  Output   2600 ml  Net   -260 ml     Laboratory CBC    Component Value Date/Time   WBC 6.3 04/25/2014 1234   HGB 12.6 04/25/2014 1234   HCT 38.2 04/25/2014 1234   PLT 248 04/25/2014 1234    BMET    Component Value Date/Time   NA 139 04/25/2014 1234   K 3.1* 04/25/2014 1234   CL 102 04/25/2014 1234   CO2 25 04/25/2014 1234   GLUCOSE 155* 04/25/2014 1234   BUN 7 04/25/2014 1234   CREATININE 0.83 04/25/2014 1234   CALCIUM 9.4 04/25/2014 1234   GFRNONAA 69* 04/25/2014 1234   GFRAA 80* 04/25/2014 1234    COAG Lab Results  Component Value Date   INR 1.00 04/25/2014   No results found for: PTT  Antibiotics Anti-infectives    Start     Dose/Rate Route Frequency Ordered Stop   05/04/14 2000  vancomycin (VANCOCIN) IVPB 1000 mg/200 mL premix  Status:  Discontinued     1,000 mg 200 mL/hr over 60 Minutes Intravenous  Once 05/04/14 1453 05/04/14 1454   05/04/14 1500  vancomycin (VANCOCIN) 1,500 mg in sodium chloride 0.9 % 500 mL IVPB     1,500 mg 250 mL/hr over 120 Minutes Intravenous  Once 05/04/14 1454 05/04/14 2127   05/04/14 0611  ceFAZolin (ANCEF) 2-3 GM-% IVPB SOLR    Comments:  Scronce, Trina   : cabinet override      05/04/14 0611 05/04/14 0736   05/04/14 0600  ceFAZolin (ANCEF) IVPB 2 g/50 mL premix  Status:  Discontinued     2 g 100 mL/hr over 30 Minutes Intravenous On call to O.R. 05/03/14 1358 05/04/14 1424       V. Leia Alf, M.D. [facility] Office: [phone] Pager:  [phone]

## 2014-05-05 NOTE — Progress Notes (Signed)
    Patient doing fantastic, minimal low back pain, expected stiffness has not yet been up with PT/OT. Good appetite    Physical Exam: BP 133/37 mmHg  Pulse 66  Temp(Src) 98.2 F (36.8 C) (Oral)  Resp 18  Ht 5' 5.5" (1.664 m)  Wt 117.482 kg (259 lb)  BMI 42.43 kg/m2  SpO2 100%   Dressing in place, pt laying comfortably in bed, SCD's in place NVI  POD #1 s/p L5-S1 Anterior Lumbar Fusion for Non-union   - up with PT/OT, encourage ambulation  -TLSO brace at all times when OOB  - Back Precautions - Percocet for pain, Valium for muscle spasms - likely d/c home todat vs tomorrow pending progress

## 2014-05-05 NOTE — Op Note (Signed)
NAMEJOSEFA, Karen Castillo NO.:  [id number]  MEDICAL RECORD NO.:  [id number]  LOCATION:  [id number]                        FACILITY:  [facility]  PHYSICIAN:  Karen Bob, MD      DATE OF BIRTH:  [birth year]  DATE OF PROCEDURE:  05/04/2014                              OPERATIVE REPORT   PREOPERATIVE DIAGNOSIS:  L5-S1 nonunion following a thoracolumbar fusion from approximately 1 year ago.  POSTOPERATIVE DIAGNOSIS:  L5-S1 nonunion following a thoracolumbar fusion from approximately 1 year ago.  PROCEDURE: 1. First assistant to Dr. Annamarie Castillo for anterior retroperitoneal     exposure to the L5-S1 level. 2. Anterior lumbar interbody fusion, L5-S1. 3. Insertion of interbody device x1 (14 mm large, 10 degrees of     lordosis, intervertebral spacer). 4. Placement of anterior instrumentation, L5, S1. 5. Use of morselized allograft - DBX mix. 6. Intraoperative use of fluoroscopy.  SURGEON:  Karen Bob, MD.  ASSISTANTPricilla Holm, PA-C  ANESTHESIA:  General endotracheal anesthesia.  COMPLICATIONS:  None.  DISPOSITION:  Stable.  ESTIMATED BLOOD LOSS:  Minimal.  INDICATIONS FOR SURGERY:  Briefly, Karen Castillo is a pleasant 74 year old female, who did present to me with ongoing and significant pain in her back.  A CAT scan did reveal nonunion at the L5-S1 level.  Of note, the patient is status post an extensive thoracolumbar fusion.  Imaging did reveal nonunion at the L5-S1 level.  We did discuss proceeding with the procedure noted above.  The patient did fully understand the risks and limitations of the procedure.  Of particular note, she did understand that surgery, even a successful fusion, will not guarantee resolution of her pain.  OPERATIVE DETAILS:  On May 04, 2014, the patient was brought to surgery and general endotracheal anesthesia was administered.  The patient was placed supine on the hospital bed.  Antibiotics were given and a time-out procedure  was performed.  An anterior retroperitoneal exposure was then performed by Dr. Trula Castillo.  I did function as first assistant for the exposure.  Once the L5-S1 intervertebral space was identified, I did perform a standard diskectomy.  I did meticulously remove the entire intervertebral disk from front to back to the level of the posterior longitudinal ligament.  The endplates were appropriately prepared.  I did place a series of interbody spacer trials, and I did feel that a 14 mm large spacer with 10 degrees of lordosis would be the most appropriate fit.  The appropriate size spacer was then packed with DBX mix and tamped into position.  I was very pleased with the AP and lateral fluoroscopic images.  I then chose the appropriate-sized anterior lumbar plate, which was placed over the anterior lumbar spine. I then secured the plate to the bone using a total of 4 vertebral body screws, 2 in L5, and 2 in S1.  I was very pleased with the press fit of each of the screws.  The screws were then locked to the plate using the CAM locking mechanism.  Again, I was very pleased with the AP and lateral fluoroscopic images.  The wound was then copiously irrigated. The fascia was then closed using #1 PDS.  The wound was then closed using #1 Vicryl followed by 2-0 Vicryl followed by 3-0 Monocryl. Benzoin and Steri-Strips were applied followed by sterile dressing.  All instrument counts were correct at the termination of the procedure.  Of note, Karen Castillo did function as my first assistant throughout the entire procedure, and did aid in retraction, suctioning, and closure.     Karen Bob, MD     MD/MEDQ  D:  05/04/2014  T:  05/05/2014  Job:  [id number]

## 2014-05-05 NOTE — Evaluation (Signed)
Physical Therapy Evaluation and D/C Patient Details Name: Karen Castillo MRN: [id number] DOB: 1940/08/24 Today's Date: 05/05/2014   History of Present Illness  Anterior lumbar interbody fusion, L5-S1. PMhx: s/p multiple back surgeries  Clinical Impression  Pt admitted with above diagnosis. Pt currently without functional limitations and is ambulating modified independently.  Pt will not need further skilled PT as education complete and all questions answered.    Follow Up Recommendations No PT follow up    Equipment Recommendations  None recommended by PT    Recommendations for Other Services       Precautions / Restrictions Precautions Precautions: Back Precaution Booklet Issued: Yes (comment) Precaution Comments: Pt aware of back precautions from prior surgeries Required Braces or Orthoses: Spinal Brace Spinal Brace: Thoracolumbosacral orthotic;Applied in sitting position;Applied in standing position (can have off to go to bathroom) Restrictions Weight Bearing Restrictions: No      Mobility  Bed Mobility Overal bed mobility: Needs Assistance;+2 for physical assistance Bed Mobility: Rolling;Sidelying to Sit Rolling: Independent Sidelying to sit: Independent       General bed mobility comments: Pt able to take brace off herself.  Pt also did well demonstrating log roll.     Transfers Overall transfer level: Needs assistance Equipment used: Rolling walker (2 wheeled) Transfers: Sit to/from Stand Sit to Stand: Independent         General transfer comment: No difficulties.  Able to unlock and lock brace independently.    Ambulation/Gait Ambulation/Gait assistance: Modified independent (Device/Increase time) Ambulation Distance (Feet): 300 Feet Assistive device: Rolling walker (2 wheeled) Gait Pattern/deviations: Step-through pattern;Decreased stride length;Antalgic;Wide base of support   Gait velocity interpretation: Below normal speed for age/gender General  Gait Details: Pt able to ambulate without RW for some distance with fairly good safety.  Pt feels more comfortable with RW at present time.  Safe with RW with good safety.    Stairs Stairs: Yes Stairs assistance: Supervision Stair Management: No rails;Step to pattern;Backwards;With walker Number of Stairs: 3 General stair comments: Educated pt and daughter regarding up and down steps with RW.  Pt and daughter feel comfortable with up and down steps and demonstrate it appropriately.  Gave handout.    Wheelchair Mobility    Modified Rankin (Stroke Patients Only)       Balance Overall balance assessment: Needs assistance;History of Falls         Standing balance support: Bilateral upper extremity supported;During functional activity Standing balance-Leahy Scale: Fair Standing balance comment: can stand statically without UE  support.                               Pertinent Vitals/Pain Pain Assessment: 0-10 Pain Score: 3  Pain Location: lower left abdomen Pain Descriptors / Indicators: Aching Pain Intervention(s): Monitored during session;Repositioned;Premedicated before session;Limited activity within patient's tolerance  VSS    Home Living Family/patient expects to be discharged to:: Private residence Living Arrangements: Children Available Help at Discharge: Family;Available PRN/intermittently (but mainly by herself after one week) Type of Home: House Home Access: Stairs to enter Entrance Stairs-Rails: None Entrance Stairs-Number of Steps: 1-1 Home Layout: One level Home Equipment: Walker - 2 wheels;Bedside commode;Shower seat;Hand held shower head;Adaptive equipment      Prior Function Level of Independence: Independent with assistive device(s)         Comments: Used a SPC occassionally     Hand Dominance   Dominant Hand: Right    Extremity/Trunk Assessment  Upper Extremity Assessment: Defer to OT evaluation           Lower Extremity  Assessment: Generalized weakness         Communication   Communication: No difficulties  Cognition Arousal/Alertness: Awake/alert Behavior During Therapy: WFL for tasks assessed/performed Overall Cognitive Status: Within Functional Limits for tasks assessed                      General Comments      Exercises        Assessment/Plan    PT Assessment Patent does not need any further PT services  PT Diagnosis Generalized weakness;Acute pain   PT Problem List    PT Treatment Interventions     PT Goals (Current goals can be found in the Care Plan section) Acute Rehab PT Goals Patient Stated Goal: home tomorrow PT Goal Formulation: All assessment and education complete, DC therapy    Frequency     Barriers to discharge        Co-evaluation               End of Session Equipment Utilized During Treatment: Gait belt;Back brace Activity Tolerance: Patient tolerated treatment well Patient left: in bed;with call bell/phone within reach;with family/visitor present Nurse Communication: Mobility status         Time: 1031-1055 PT Time Calculation (min) (ACUTE ONLY): 24 min   Charges:   PT Evaluation $Initial PT Evaluation Tier I: 1 Procedure PT Treatments $Gait Training: 8-22 mins   PT G CodesIrwin Brakeman F 2014/05/25, 1:17 PM [employer] Acute Rehabilitation [phone] (pager)

## 2014-05-06 NOTE — Progress Notes (Signed)
Patient alert and oriented, mae's well, voiding adequate amount of urine, swallowing without difficulty, no c/o pain. Patient discharged home with family. Script and discharged instructions given to patient. Patient and family stated understanding of d/c instructions given and has an appointment with MD. Su Grand RN

## 2014-05-06 NOTE — Discharge Instructions (Signed)
See printed instructions in chart.

## 2014-05-06 NOTE — Progress Notes (Signed)
    Patient doing well, eating breakfast, minimal low back pain, planning on d/c today   Physical Exam: BP 155/44 mmHg  Pulse 83  Temp(Src) 98.9 F (37.2 C) (Oral)  Resp 20  Ht 5' 5.5" (1.664 m)  Wt 117.482 kg (259 lb)  BMI 42.43 kg/m2  SpO2 97%   Dressing in place, pt laying comfortably in bed, SCD's in place NVI, calves soft  POD #2 s/p L5-S1 Anterior Lumbar Fusion for Non-union   - up with PT/OT, encourage ambulation  -TLSO brace at all times when OOB  - Back Precautions - Percocet for pain, Valium for muscle spasms - d/c home today

## 2014-05-08 ENCOUNTER — Encounter (HOSPITAL_COMMUNITY): Payer: Self-pay | Admitting: Orthopedic Surgery

## 2014-05-08 MED FILL — Cholecalciferol Tab 25 MCG (1000 Unit): ORAL | Qty: 2 | Status: AC

## 2014-05-08 MED FILL — Heparin Sodium (Porcine) Inj 1000 Unit/ML: INTRAMUSCULAR | Qty: 30 | Status: AC

## 2014-05-08 MED FILL — Sodium Chloride IV Soln 0.9%: INTRAVENOUS | Qty: 1000 | Status: AC

## 2014-05-10 NOTE — Discharge Summary (Signed)
Patient ID: Karen Castillo MRN: [id number] DOB/AGE: June 29, 1940 74 y.o.  Admit date: 05/04/2014 Discharge date: 05/06/2014  Admission Diagnoses:  Active Problems:   Radiculopathy   Discharge Diagnoses:  Same  Past Medical History  Diagnosis Date  . Hypertension   . Sleep apnea     USES CPAP   . Bipolar disorder   . GERD (gastroesophageal reflux disease)   . Arthritis   . Cancer     SKIN    ,    LEFT BREAST     Surgeries: Procedure(s): ANTERIOR LUMBAR FUSION 1 LEVEL L5-S1 ABDOMINAL EXPOSURE on 05/04/2014   Consultants: Treatment Team:  Serafina Mitchell, MD  Discharged Condition: Improved  Hospital Course: Karen Castillo is an 74 y.o. female who was admitted 05/04/2014 for operative treatment of nonunion. Patient has severe unremitting pain that affects sleep, daily activities, and work/hobbies. After pre-op clearance the patient was taken to the operating room on 05/04/2014 and underwent  Procedure(s): ANTERIOR LUMBAR FUSION 1 LEVEL L5-S1 ABDOMINAL EXPOSURE.    Patient was given perioperative antibiotics:  Anti-infectives    Start     Dose/Rate Route Frequency Ordered Stop   05/04/14 2000  vancomycin (VANCOCIN) IVPB 1000 mg/200 mL premix  Status:  Discontinued     1,000 mg 200 mL/hr over 60 Minutes Intravenous  Once 05/04/14 1453 05/04/14 1454   05/04/14 1500  vancomycin (VANCOCIN) 1,500 mg in sodium chloride 0.9 % 500 mL IVPB     1,500 mg 250 mL/hr over 120 Minutes Intravenous  Once 05/04/14 1454 05/04/14 2127   05/04/14 0611  ceFAZolin (ANCEF) 2-3 GM-% IVPB SOLR    Comments:  Scronce, Trina   : cabinet override      05/04/14 0611 05/04/14 0736   05/04/14 0600  ceFAZolin (ANCEF) IVPB 2 g/50 mL premix  Status:  Discontinued     2 g 100 mL/hr over 30 Minutes Intravenous On call to O.R. 05/03/14 1358 05/04/14 1424       Patient was given sequential compression devices, early ambulation to prevent DVT.  Patient benefited maximally from hospital stay and there were  no complications.    Recent vital signs: BP 155/44 mmHg  Pulse 83  Temp(Src) 98.9 F (37.2 C) (Oral)  Resp 20  Ht 5' 5.5" (1.664 m)  Wt 117.482 kg (259 lb)  BMI 42.43 kg/m2  SpO2 97%   Discharge Medications:     Medication List    STOP taking these medications        methocarbamol 750 MG tablet  Commonly known as:  ROBAXIN      TAKE these medications        ALIGN PO  Take 1 capsule by mouth daily.     amLODipine 5 MG tablet  Commonly known as:  NORVASC  Take 5 mg by mouth daily.     atenolol 100 MG tablet  Commonly known as:  TENORMIN  Take 100 mg by mouth daily.     Biotin 10 MG Tabs  Take 1 tablet by mouth daily.     buPROPion 150 MG 24 hr tablet  Commonly known as:  WELLBUTRIN XL  Take 150 mg by mouth daily.     Calcium Carb-Cholecalciferol 600-800 MG-UNIT Tabs  Take 1 tablet by mouth daily.     calcium carbonate 1250 (500 CA) MG chewable tablet  Commonly known as:  OS-CAL  Chew 2 tablets by mouth daily.     chlorthalidone 25 MG tablet  Commonly known as:  HYGROTON  Take 25 mg by mouth daily.     clonazePAM 0.5 MG tablet  Commonly known as:  KLONOPIN  Take 0.25 mg by mouth daily.     Coenzyme Q10 200 MG capsule  Take 200 mg by mouth daily.     COMPLETE MULTIVITAMIN/MINERAL PO  Take 1 tablet by mouth daily.     desvenlafaxine 100 MG 24 hr tablet  Commonly known as:  PRISTIQ  Take 100 mg by mouth daily.     gabapentin 300 MG capsule  Commonly known as:  NEURONTIN  Take 300 mg by mouth 3 (three) times daily.     glucosamine-chondroitin 500-400 MG tablet  Take 2 tablets by mouth daily.     lansoprazole 30 MG capsule  Commonly known as:  PREVACID  Take 30 mg by mouth 2 (two) times daily before a meal.     latanoprost 0.005 % ophthalmic solution  Commonly known as:  XALATAN  Place 1 drop into both eyes at bedtime.     Magnesium 250 MG Tabs  Take 1 tablet by mouth daily.     prazosin 2 MG capsule  Commonly known as:  MINIPRESS  Take 2  mg by mouth 3 (three) times daily.     SUPER B COMPLEX PO  Take 1 tablet by mouth daily.     timolol 0.5 % ophthalmic solution  Commonly known as:  BETIMOL  Place 1 drop into both eyes every morning.     vitamin C 500 MG tablet  Commonly known as:  ASCORBIC ACID  Take 500 mg by mouth 2 (two) times daily.     Vitamin D 2000 UNITS Caps  Take 1 capsule by mouth daily.     vitamin E 400 UNIT capsule  Take 400 Units by mouth daily.     Zinc 50 MG Tabs  Take 1 tablet by mouth daily.     zolpidem 10 MG tablet  Commonly known as:  AMBIEN  Take 10 mg by mouth at bedtime.        Diagnostic Studies: Dg Chest 2 View  04/25/2014   CLINICAL DATA:  Preoperative examination  EXAM: CHEST  2 VIEW  COMPARISON:  None.  FINDINGS: The lungs are adequately inflated. The interstitial markings are somewhat coarse. The heart is normal in size. The pulmonary vascularity is not engorged. There is no pleural effusion. The trachea is midline. The mediastinum is normal in width. The patient has undergone lower thoracic and upper lumbar posterior fusion.  IMPRESSION: There is mild prominence of the interstitial markings which may reflect a smoking history if any or reflect other chronic fibrotic process. There is no evidence of pneumonia, CHF, or other acute cardiopulmonary abnormality.   Electronically Signed   By: David  Martinique   On: 04/25/2014 14:09   Dg Lumbar Spine 2-3 Views  05/04/2014   CLINICAL DATA:  L5-S1 anterior lumbar interbody fusion  EXAM: LUMBAR SPINE - 2-3 VIEW; DG C-ARM 61-120 MIN  COMPARISON:  02/24/2014  FINDINGS: Two fluoroscopic spot images demonstrate anterior plate and screw fixator at L5-S1 along with posterolateral rods and pedicle screws. The pedicle screws are seen at L4, L5, and S1 bilaterally. No specific complicating feature.  IMPRESSION: 1. No complicating feature regarding the anterior plate and screw fixation at L5-S1.   Electronically Signed   By: Van Clines M.D.   On:  05/04/2014 13:36   Dg C-arm 1-60 Min  05/04/2014   CLINICAL DATA:  L5-S1 anterior lumbar interbody fusion  EXAM: LUMBAR SPINE - 2-3 VIEW; DG C-ARM 61-120 MIN  COMPARISON:  02/24/2014  FINDINGS: Two fluoroscopic spot images demonstrate anterior plate and screw fixator at L5-S1 along with posterolateral rods and pedicle screws. The pedicle screws are seen at L4, L5, and S1 bilaterally. No specific complicating feature.  IMPRESSION: 1. No complicating feature regarding the anterior plate and screw fixation at L5-S1.   Electronically Signed   By: Van Clines M.D.   On: 05/04/2014 13:36   Ct Outside Films Spine  05/04/2014   CLINICAL DATA:    This exam is stored here for comparison purposes only and was performed at  an outside facility.   Please contact the originating institution for any  associated interpretation or report.    Dg Or Local Abdomen  05/04/2014   CLINICAL DATA:  74 year old female status post L5-S1 anterior lumbar interbody fusion and allograft. Instrument count.  EXAM: OR LOCAL ABDOMEN  COMPARISON:  10/19/2013.  FINDINGS: New plate and screw fixation device at the L5-S1 level. Previously noted rod and screw fixation device throughout the lower lumbar spine redemonstrated, with lucency around the fixation screws in the S1 level. These findings are unchanged. Multiple surgical staples are seen projecting over the upper and mid abdomen, as well as the inguinal regions bilaterally. No other unexpected radiopaque medical device identified. Calcifications projecting over the right renal contour, favored to represent nephrolithiasis, more pronounced in the prior study.  IMPRESSION: 1. No unexpected retained radiopaque medical instrument noted. 2. Postoperative findings, as above. 3. Increasing conspicuity of calcifications projecting over the right upper quadrant of the abdomen, presumably nephrolithiasis. This could be confirmed with followup non contrast CT of the abdomen and pelvis if  clinically appropriate.   Electronically Signed   By: Vinnie Langton M.D.   On: 05/04/2014 11:53    Disposition: 01-Home or Self Care   POD #2 s/p L5-S1 Anterior Lumbar Fusion for Non-union   - up with PT/OT, encourage ambulation -TLSO brace at all times when OOB - Back Precautions - Percocet for pain, Valium for muscle spasms -Written scripts for pain signed and in chart -D/C instructions sheet printed and in chart -D/C today  -F/U in office 2 weeks   Signed: Justice Britain 05/10/2014, 2:58 PM

## 2015-03-11 HISTORY — PX: CATARACT EXTRACTION, BILATERAL: SHX1313

## 2015-03-23 DIAGNOSIS — M7062 Trochanteric bursitis, left hip: Secondary | ICD-10-CM | POA: Diagnosis not present

## 2015-03-23 DIAGNOSIS — M545 Low back pain: Secondary | ICD-10-CM | POA: Diagnosis not present

## 2015-03-23 DIAGNOSIS — M7061 Trochanteric bursitis, right hip: Secondary | ICD-10-CM | POA: Diagnosis not present

## 2015-03-27 DIAGNOSIS — H18412 Arcus senilis, left eye: Secondary | ICD-10-CM | POA: Diagnosis not present

## 2015-03-27 DIAGNOSIS — H18411 Arcus senilis, right eye: Secondary | ICD-10-CM | POA: Diagnosis not present

## 2015-03-27 DIAGNOSIS — H02839 Dermatochalasis of unspecified eye, unspecified eyelid: Secondary | ICD-10-CM | POA: Diagnosis not present

## 2015-03-27 DIAGNOSIS — H2511 Age-related nuclear cataract, right eye: Secondary | ICD-10-CM | POA: Diagnosis not present

## 2015-03-30 DIAGNOSIS — M545 Low back pain: Secondary | ICD-10-CM | POA: Diagnosis not present

## 2015-03-30 DIAGNOSIS — M6281 Muscle weakness (generalized): Secondary | ICD-10-CM | POA: Diagnosis not present

## 2015-04-02 DIAGNOSIS — M545 Low back pain: Secondary | ICD-10-CM | POA: Diagnosis not present

## 2015-04-02 DIAGNOSIS — M6281 Muscle weakness (generalized): Secondary | ICD-10-CM | POA: Diagnosis not present

## 2015-04-03 DIAGNOSIS — G4733 Obstructive sleep apnea (adult) (pediatric): Secondary | ICD-10-CM | POA: Insufficient documentation

## 2015-04-03 DIAGNOSIS — G4721 Circadian rhythm sleep disorder, delayed sleep phase type: Secondary | ICD-10-CM | POA: Diagnosis not present

## 2015-04-03 DIAGNOSIS — F5105 Insomnia due to other mental disorder: Secondary | ICD-10-CM | POA: Diagnosis not present

## 2015-04-03 DIAGNOSIS — F419 Anxiety disorder, unspecified: Secondary | ICD-10-CM | POA: Diagnosis not present

## 2015-04-03 DIAGNOSIS — F5104 Psychophysiologic insomnia: Secondary | ICD-10-CM

## 2015-04-03 HISTORY — DX: Circadian rhythm sleep disorder, delayed sleep phase type: G47.21

## 2015-04-03 HISTORY — DX: Psychophysiologic insomnia: F51.04

## 2015-04-03 HISTORY — DX: Obstructive sleep apnea (adult) (pediatric): G47.33

## 2015-04-04 DIAGNOSIS — M545 Low back pain: Secondary | ICD-10-CM | POA: Diagnosis not present

## 2015-04-04 DIAGNOSIS — M6281 Muscle weakness (generalized): Secondary | ICD-10-CM | POA: Diagnosis not present

## 2015-04-10 DIAGNOSIS — M545 Low back pain: Secondary | ICD-10-CM | POA: Diagnosis not present

## 2015-04-10 DIAGNOSIS — M6281 Muscle weakness (generalized): Secondary | ICD-10-CM | POA: Diagnosis not present

## 2015-04-17 DIAGNOSIS — M545 Low back pain: Secondary | ICD-10-CM | POA: Diagnosis not present

## 2015-04-17 DIAGNOSIS — M6281 Muscle weakness (generalized): Secondary | ICD-10-CM | POA: Diagnosis not present

## 2015-04-20 DIAGNOSIS — M545 Low back pain: Secondary | ICD-10-CM | POA: Diagnosis not present

## 2015-04-20 DIAGNOSIS — M6281 Muscle weakness (generalized): Secondary | ICD-10-CM | POA: Diagnosis not present

## 2015-04-24 DIAGNOSIS — M6281 Muscle weakness (generalized): Secondary | ICD-10-CM | POA: Diagnosis not present

## 2015-04-24 DIAGNOSIS — M545 Low back pain: Secondary | ICD-10-CM | POA: Diagnosis not present

## 2015-04-27 DIAGNOSIS — M545 Low back pain: Secondary | ICD-10-CM | POA: Diagnosis not present

## 2015-04-27 DIAGNOSIS — M6281 Muscle weakness (generalized): Secondary | ICD-10-CM | POA: Diagnosis not present

## 2015-04-30 DIAGNOSIS — M6281 Muscle weakness (generalized): Secondary | ICD-10-CM | POA: Diagnosis not present

## 2015-04-30 DIAGNOSIS — M545 Low back pain: Secondary | ICD-10-CM | POA: Diagnosis not present

## 2015-05-01 DIAGNOSIS — G4733 Obstructive sleep apnea (adult) (pediatric): Secondary | ICD-10-CM | POA: Diagnosis not present

## 2015-05-01 DIAGNOSIS — G4721 Circadian rhythm sleep disorder, delayed sleep phase type: Secondary | ICD-10-CM | POA: Diagnosis not present

## 2015-05-01 DIAGNOSIS — F5104 Psychophysiologic insomnia: Secondary | ICD-10-CM | POA: Diagnosis not present

## 2015-05-04 DIAGNOSIS — R209 Unspecified disturbances of skin sensation: Secondary | ICD-10-CM | POA: Diagnosis not present

## 2015-05-11 DIAGNOSIS — M545 Low back pain: Secondary | ICD-10-CM | POA: Diagnosis not present

## 2015-05-11 DIAGNOSIS — M6281 Muscle weakness (generalized): Secondary | ICD-10-CM | POA: Diagnosis not present

## 2015-05-15 DIAGNOSIS — M6281 Muscle weakness (generalized): Secondary | ICD-10-CM | POA: Diagnosis not present

## 2015-05-15 DIAGNOSIS — M545 Low back pain: Secondary | ICD-10-CM | POA: Diagnosis not present

## 2015-05-18 DIAGNOSIS — M545 Low back pain: Secondary | ICD-10-CM | POA: Diagnosis not present

## 2015-05-18 DIAGNOSIS — M6281 Muscle weakness (generalized): Secondary | ICD-10-CM | POA: Diagnosis not present

## 2015-05-22 DIAGNOSIS — M545 Low back pain: Secondary | ICD-10-CM | POA: Diagnosis not present

## 2015-05-22 DIAGNOSIS — Z1211 Encounter for screening for malignant neoplasm of colon: Secondary | ICD-10-CM | POA: Diagnosis not present

## 2015-05-22 DIAGNOSIS — M6281 Muscle weakness (generalized): Secondary | ICD-10-CM | POA: Diagnosis not present

## 2015-05-22 DIAGNOSIS — Z0000 Encounter for general adult medical examination without abnormal findings: Secondary | ICD-10-CM | POA: Diagnosis not present

## 2015-05-22 DIAGNOSIS — Z1239 Encounter for other screening for malignant neoplasm of breast: Secondary | ICD-10-CM | POA: Diagnosis not present

## 2015-06-01 DIAGNOSIS — C50912 Malignant neoplasm of unspecified site of left female breast: Secondary | ICD-10-CM | POA: Diagnosis not present

## 2015-06-01 DIAGNOSIS — Z0000 Encounter for general adult medical examination without abnormal findings: Secondary | ICD-10-CM | POA: Diagnosis not present

## 2015-06-01 DIAGNOSIS — K219 Gastro-esophageal reflux disease without esophagitis: Secondary | ICD-10-CM | POA: Diagnosis not present

## 2015-06-01 DIAGNOSIS — F3342 Major depressive disorder, recurrent, in full remission: Secondary | ICD-10-CM | POA: Diagnosis not present

## 2015-06-01 DIAGNOSIS — E669 Obesity, unspecified: Secondary | ICD-10-CM | POA: Diagnosis not present

## 2015-06-01 DIAGNOSIS — Z1389 Encounter for screening for other disorder: Secondary | ICD-10-CM | POA: Diagnosis not present

## 2015-06-01 DIAGNOSIS — M109 Gout, unspecified: Secondary | ICD-10-CM | POA: Diagnosis not present

## 2015-06-01 DIAGNOSIS — Z1211 Encounter for screening for malignant neoplasm of colon: Secondary | ICD-10-CM | POA: Diagnosis not present

## 2015-06-01 DIAGNOSIS — Z136 Encounter for screening for cardiovascular disorders: Secondary | ICD-10-CM | POA: Diagnosis not present

## 2015-06-01 DIAGNOSIS — R8290 Unspecified abnormal findings in urine: Secondary | ICD-10-CM | POA: Diagnosis not present

## 2015-06-01 DIAGNOSIS — Z9181 History of falling: Secondary | ICD-10-CM | POA: Diagnosis not present

## 2015-06-01 DIAGNOSIS — E785 Hyperlipidemia, unspecified: Secondary | ICD-10-CM | POA: Diagnosis not present

## 2015-06-01 DIAGNOSIS — I10 Essential (primary) hypertension: Secondary | ICD-10-CM | POA: Diagnosis not present

## 2015-06-11 DIAGNOSIS — F5105 Insomnia due to other mental disorder: Secondary | ICD-10-CM | POA: Diagnosis not present

## 2015-06-11 DIAGNOSIS — Z9181 History of falling: Secondary | ICD-10-CM | POA: Insufficient documentation

## 2015-06-11 DIAGNOSIS — G609 Hereditary and idiopathic neuropathy, unspecified: Secondary | ICD-10-CM

## 2015-06-11 DIAGNOSIS — G4721 Circadian rhythm sleep disorder, delayed sleep phase type: Secondary | ICD-10-CM | POA: Diagnosis not present

## 2015-06-11 DIAGNOSIS — F419 Anxiety disorder, unspecified: Secondary | ICD-10-CM | POA: Diagnosis not present

## 2015-06-11 DIAGNOSIS — F5104 Psychophysiologic insomnia: Secondary | ICD-10-CM | POA: Diagnosis not present

## 2015-06-11 DIAGNOSIS — G4733 Obstructive sleep apnea (adult) (pediatric): Secondary | ICD-10-CM | POA: Diagnosis not present

## 2015-06-11 HISTORY — DX: Hereditary and idiopathic neuropathy, unspecified: G60.9

## 2015-06-11 HISTORY — DX: History of falling: Z91.81

## 2015-06-14 DIAGNOSIS — M5416 Radiculopathy, lumbar region: Secondary | ICD-10-CM | POA: Diagnosis not present

## 2015-06-18 DIAGNOSIS — H2511 Age-related nuclear cataract, right eye: Secondary | ICD-10-CM | POA: Diagnosis not present

## 2015-06-18 DIAGNOSIS — H25811 Combined forms of age-related cataract, right eye: Secondary | ICD-10-CM | POA: Diagnosis not present

## 2015-06-18 DIAGNOSIS — H2513 Age-related nuclear cataract, bilateral: Secondary | ICD-10-CM | POA: Diagnosis not present

## 2015-06-19 DIAGNOSIS — H2512 Age-related nuclear cataract, left eye: Secondary | ICD-10-CM | POA: Diagnosis not present

## 2015-06-29 DIAGNOSIS — R8290 Unspecified abnormal findings in urine: Secondary | ICD-10-CM | POA: Diagnosis not present

## 2015-07-02 DIAGNOSIS — H25812 Combined forms of age-related cataract, left eye: Secondary | ICD-10-CM | POA: Diagnosis not present

## 2015-07-02 DIAGNOSIS — H2512 Age-related nuclear cataract, left eye: Secondary | ICD-10-CM | POA: Diagnosis not present

## 2015-07-03 DIAGNOSIS — H2513 Age-related nuclear cataract, bilateral: Secondary | ICD-10-CM | POA: Diagnosis not present

## 2015-07-04 DIAGNOSIS — R8290 Unspecified abnormal findings in urine: Secondary | ICD-10-CM | POA: Diagnosis not present

## 2015-07-17 DIAGNOSIS — Z853 Personal history of malignant neoplasm of breast: Secondary | ICD-10-CM | POA: Diagnosis not present

## 2015-07-18 DIAGNOSIS — M545 Low back pain: Secondary | ICD-10-CM | POA: Diagnosis not present

## 2015-07-18 DIAGNOSIS — I10 Essential (primary) hypertension: Secondary | ICD-10-CM | POA: Diagnosis not present

## 2015-08-02 DIAGNOSIS — D225 Melanocytic nevi of trunk: Secondary | ICD-10-CM | POA: Diagnosis not present

## 2015-08-02 DIAGNOSIS — D1801 Hemangioma of skin and subcutaneous tissue: Secondary | ICD-10-CM | POA: Diagnosis not present

## 2015-08-02 DIAGNOSIS — L820 Inflamed seborrheic keratosis: Secondary | ICD-10-CM | POA: Diagnosis not present

## 2015-08-02 DIAGNOSIS — L821 Other seborrheic keratosis: Secondary | ICD-10-CM | POA: Diagnosis not present

## 2015-08-02 DIAGNOSIS — L570 Actinic keratosis: Secondary | ICD-10-CM | POA: Diagnosis not present

## 2015-08-13 DIAGNOSIS — G4733 Obstructive sleep apnea (adult) (pediatric): Secondary | ICD-10-CM | POA: Diagnosis not present

## 2015-08-13 DIAGNOSIS — F5105 Insomnia due to other mental disorder: Secondary | ICD-10-CM | POA: Diagnosis not present

## 2015-08-13 DIAGNOSIS — G609 Hereditary and idiopathic neuropathy, unspecified: Secondary | ICD-10-CM | POA: Diagnosis not present

## 2015-08-13 DIAGNOSIS — F419 Anxiety disorder, unspecified: Secondary | ICD-10-CM | POA: Diagnosis not present

## 2015-08-13 DIAGNOSIS — G4721 Circadian rhythm sleep disorder, delayed sleep phase type: Secondary | ICD-10-CM | POA: Diagnosis not present

## 2015-08-13 DIAGNOSIS — G894 Chronic pain syndrome: Secondary | ICD-10-CM | POA: Diagnosis not present

## 2015-08-13 DIAGNOSIS — F5104 Psychophysiologic insomnia: Secondary | ICD-10-CM | POA: Diagnosis not present

## 2015-08-13 DIAGNOSIS — Z9181 History of falling: Secondary | ICD-10-CM | POA: Diagnosis not present

## 2015-08-13 DIAGNOSIS — M961 Postlaminectomy syndrome, not elsewhere classified: Secondary | ICD-10-CM | POA: Diagnosis not present

## 2015-08-15 DIAGNOSIS — I10 Essential (primary) hypertension: Secondary | ICD-10-CM | POA: Diagnosis not present

## 2015-08-15 DIAGNOSIS — M545 Low back pain: Secondary | ICD-10-CM | POA: Diagnosis not present

## 2015-08-15 DIAGNOSIS — R209 Unspecified disturbances of skin sensation: Secondary | ICD-10-CM | POA: Diagnosis not present

## 2015-08-15 DIAGNOSIS — R252 Cramp and spasm: Secondary | ICD-10-CM | POA: Diagnosis not present

## 2015-08-24 DIAGNOSIS — G4733 Obstructive sleep apnea (adult) (pediatric): Secondary | ICD-10-CM | POA: Diagnosis not present

## 2015-08-24 DIAGNOSIS — R5383 Other fatigue: Secondary | ICD-10-CM | POA: Diagnosis not present

## 2015-08-28 DIAGNOSIS — M542 Cervicalgia: Secondary | ICD-10-CM | POA: Diagnosis not present

## 2015-08-28 DIAGNOSIS — M545 Low back pain: Secondary | ICD-10-CM | POA: Diagnosis not present

## 2015-08-29 DIAGNOSIS — E559 Vitamin D deficiency, unspecified: Secondary | ICD-10-CM | POA: Diagnosis not present

## 2015-09-06 DIAGNOSIS — M542 Cervicalgia: Secondary | ICD-10-CM | POA: Diagnosis not present

## 2015-09-10 DIAGNOSIS — M542 Cervicalgia: Secondary | ICD-10-CM | POA: Diagnosis not present

## 2015-09-14 DIAGNOSIS — M5416 Radiculopathy, lumbar region: Secondary | ICD-10-CM | POA: Diagnosis not present

## 2015-09-14 DIAGNOSIS — G894 Chronic pain syndrome: Secondary | ICD-10-CM | POA: Diagnosis not present

## 2015-09-14 DIAGNOSIS — G4733 Obstructive sleep apnea (adult) (pediatric): Secondary | ICD-10-CM | POA: Diagnosis not present

## 2015-09-14 DIAGNOSIS — M961 Postlaminectomy syndrome, not elsewhere classified: Secondary | ICD-10-CM | POA: Diagnosis not present

## 2015-10-03 DIAGNOSIS — G5601 Carpal tunnel syndrome, right upper limb: Secondary | ICD-10-CM | POA: Diagnosis not present

## 2015-10-04 DIAGNOSIS — G4733 Obstructive sleep apnea (adult) (pediatric): Secondary | ICD-10-CM | POA: Diagnosis not present

## 2015-10-11 DIAGNOSIS — G4733 Obstructive sleep apnea (adult) (pediatric): Secondary | ICD-10-CM | POA: Diagnosis not present

## 2015-10-11 DIAGNOSIS — R5383 Other fatigue: Secondary | ICD-10-CM | POA: Diagnosis not present

## 2015-10-11 DIAGNOSIS — G4700 Insomnia, unspecified: Secondary | ICD-10-CM | POA: Diagnosis not present

## 2015-10-17 DIAGNOSIS — G894 Chronic pain syndrome: Secondary | ICD-10-CM | POA: Diagnosis not present

## 2015-10-17 DIAGNOSIS — G4733 Obstructive sleep apnea (adult) (pediatric): Secondary | ICD-10-CM | POA: Diagnosis not present

## 2015-10-17 DIAGNOSIS — M961 Postlaminectomy syndrome, not elsewhere classified: Secondary | ICD-10-CM | POA: Diagnosis not present

## 2015-10-17 DIAGNOSIS — M5416 Radiculopathy, lumbar region: Secondary | ICD-10-CM | POA: Diagnosis not present

## 2015-10-17 DIAGNOSIS — F1120 Opioid dependence, uncomplicated: Secondary | ICD-10-CM | POA: Diagnosis not present

## 2015-10-18 DIAGNOSIS — G5601 Carpal tunnel syndrome, right upper limb: Secondary | ICD-10-CM | POA: Diagnosis not present

## 2015-10-24 DIAGNOSIS — G4733 Obstructive sleep apnea (adult) (pediatric): Secondary | ICD-10-CM | POA: Diagnosis not present

## 2015-10-24 DIAGNOSIS — H04123 Dry eye syndrome of bilateral lacrimal glands: Secondary | ICD-10-CM | POA: Diagnosis not present

## 2015-10-24 DIAGNOSIS — H401121 Primary open-angle glaucoma, left eye, mild stage: Secondary | ICD-10-CM | POA: Diagnosis not present

## 2015-10-26 DIAGNOSIS — G5602 Carpal tunnel syndrome, left upper limb: Secondary | ICD-10-CM | POA: Diagnosis not present

## 2015-10-31 DIAGNOSIS — M5416 Radiculopathy, lumbar region: Secondary | ICD-10-CM | POA: Diagnosis not present

## 2015-10-31 DIAGNOSIS — Z79899 Other long term (current) drug therapy: Secondary | ICD-10-CM | POA: Diagnosis not present

## 2015-10-31 DIAGNOSIS — Z5181 Encounter for therapeutic drug level monitoring: Secondary | ICD-10-CM | POA: Diagnosis not present

## 2015-10-31 DIAGNOSIS — M961 Postlaminectomy syndrome, not elsewhere classified: Secondary | ICD-10-CM | POA: Diagnosis not present

## 2015-11-08 DIAGNOSIS — G5601 Carpal tunnel syndrome, right upper limb: Secondary | ICD-10-CM | POA: Diagnosis not present

## 2015-11-15 DIAGNOSIS — F5105 Insomnia due to other mental disorder: Secondary | ICD-10-CM | POA: Diagnosis not present

## 2015-11-15 DIAGNOSIS — E785 Hyperlipidemia, unspecified: Secondary | ICD-10-CM | POA: Diagnosis not present

## 2015-11-15 DIAGNOSIS — M109 Gout, unspecified: Secondary | ICD-10-CM | POA: Diagnosis not present

## 2015-11-15 DIAGNOSIS — I10 Essential (primary) hypertension: Secondary | ICD-10-CM | POA: Diagnosis not present

## 2015-11-15 DIAGNOSIS — K219 Gastro-esophageal reflux disease without esophagitis: Secondary | ICD-10-CM | POA: Diagnosis not present

## 2015-11-15 DIAGNOSIS — F419 Anxiety disorder, unspecified: Secondary | ICD-10-CM | POA: Diagnosis not present

## 2015-11-15 DIAGNOSIS — Z87898 Personal history of other specified conditions: Secondary | ICD-10-CM | POA: Diagnosis not present

## 2015-11-16 DIAGNOSIS — G4733 Obstructive sleep apnea (adult) (pediatric): Secondary | ICD-10-CM | POA: Diagnosis not present

## 2015-11-19 DIAGNOSIS — G4700 Insomnia, unspecified: Secondary | ICD-10-CM | POA: Diagnosis not present

## 2015-11-19 DIAGNOSIS — R5383 Other fatigue: Secondary | ICD-10-CM | POA: Diagnosis not present

## 2015-11-19 DIAGNOSIS — G4733 Obstructive sleep apnea (adult) (pediatric): Secondary | ICD-10-CM | POA: Diagnosis not present

## 2015-11-23 DIAGNOSIS — F4542 Pain disorder with related psychological factors: Secondary | ICD-10-CM | POA: Diagnosis not present

## 2015-11-23 DIAGNOSIS — M961 Postlaminectomy syndrome, not elsewhere classified: Secondary | ICD-10-CM | POA: Diagnosis not present

## 2015-11-23 DIAGNOSIS — M5416 Radiculopathy, lumbar region: Secondary | ICD-10-CM | POA: Diagnosis not present

## 2015-11-24 DIAGNOSIS — G4733 Obstructive sleep apnea (adult) (pediatric): Secondary | ICD-10-CM | POA: Diagnosis not present

## 2015-11-26 DIAGNOSIS — H04123 Dry eye syndrome of bilateral lacrimal glands: Secondary | ICD-10-CM | POA: Diagnosis not present

## 2015-11-26 DIAGNOSIS — H401113 Primary open-angle glaucoma, right eye, severe stage: Secondary | ICD-10-CM | POA: Diagnosis not present

## 2015-11-29 DIAGNOSIS — M961 Postlaminectomy syndrome, not elsewhere classified: Secondary | ICD-10-CM

## 2015-11-29 DIAGNOSIS — G894 Chronic pain syndrome: Secondary | ICD-10-CM | POA: Diagnosis not present

## 2015-11-29 DIAGNOSIS — M5416 Radiculopathy, lumbar region: Secondary | ICD-10-CM | POA: Diagnosis not present

## 2015-11-29 HISTORY — DX: Chronic pain syndrome: G89.4

## 2015-11-29 HISTORY — DX: Postlaminectomy syndrome, not elsewhere classified: M96.1

## 2015-11-29 HISTORY — DX: Radiculopathy, lumbar region: M54.16

## 2015-11-30 DIAGNOSIS — G5601 Carpal tunnel syndrome, right upper limb: Secondary | ICD-10-CM | POA: Diagnosis not present

## 2015-12-13 DIAGNOSIS — G5601 Carpal tunnel syndrome, right upper limb: Secondary | ICD-10-CM | POA: Diagnosis not present

## 2015-12-24 DIAGNOSIS — G4733 Obstructive sleep apnea (adult) (pediatric): Secondary | ICD-10-CM | POA: Diagnosis not present

## 2015-12-26 DIAGNOSIS — Z79899 Other long term (current) drug therapy: Secondary | ICD-10-CM | POA: Diagnosis not present

## 2015-12-26 DIAGNOSIS — Z5181 Encounter for therapeutic drug level monitoring: Secondary | ICD-10-CM | POA: Diagnosis not present

## 2015-12-26 DIAGNOSIS — M5416 Radiculopathy, lumbar region: Secondary | ICD-10-CM | POA: Diagnosis not present

## 2015-12-26 DIAGNOSIS — M961 Postlaminectomy syndrome, not elsewhere classified: Secondary | ICD-10-CM | POA: Diagnosis not present

## 2015-12-26 DIAGNOSIS — G894 Chronic pain syndrome: Secondary | ICD-10-CM | POA: Diagnosis not present

## 2016-01-01 DIAGNOSIS — M961 Postlaminectomy syndrome, not elsewhere classified: Secondary | ICD-10-CM | POA: Diagnosis not present

## 2016-01-14 DIAGNOSIS — C50912 Malignant neoplasm of unspecified site of left female breast: Secondary | ICD-10-CM | POA: Diagnosis not present

## 2016-01-14 DIAGNOSIS — R928 Other abnormal and inconclusive findings on diagnostic imaging of breast: Secondary | ICD-10-CM | POA: Diagnosis not present

## 2016-01-17 DIAGNOSIS — Z23 Encounter for immunization: Secondary | ICD-10-CM | POA: Diagnosis not present

## 2016-01-17 DIAGNOSIS — Z853 Personal history of malignant neoplasm of breast: Secondary | ICD-10-CM | POA: Diagnosis not present

## 2016-01-18 DIAGNOSIS — R0600 Dyspnea, unspecified: Secondary | ICD-10-CM | POA: Diagnosis not present

## 2016-01-18 DIAGNOSIS — G2581 Restless legs syndrome: Secondary | ICD-10-CM | POA: Diagnosis not present

## 2016-01-18 DIAGNOSIS — G4733 Obstructive sleep apnea (adult) (pediatric): Secondary | ICD-10-CM | POA: Diagnosis not present

## 2016-01-18 DIAGNOSIS — J301 Allergic rhinitis due to pollen: Secondary | ICD-10-CM | POA: Diagnosis not present

## 2016-01-23 DIAGNOSIS — G4733 Obstructive sleep apnea (adult) (pediatric): Secondary | ICD-10-CM | POA: Diagnosis not present

## 2016-01-24 DIAGNOSIS — G4733 Obstructive sleep apnea (adult) (pediatric): Secondary | ICD-10-CM | POA: Diagnosis not present

## 2016-01-24 DIAGNOSIS — G894 Chronic pain syndrome: Secondary | ICD-10-CM | POA: Diagnosis not present

## 2016-01-24 DIAGNOSIS — M961 Postlaminectomy syndrome, not elsewhere classified: Secondary | ICD-10-CM | POA: Diagnosis not present

## 2016-01-24 DIAGNOSIS — M5416 Radiculopathy, lumbar region: Secondary | ICD-10-CM | POA: Diagnosis not present

## 2016-01-25 DIAGNOSIS — R05 Cough: Secondary | ICD-10-CM | POA: Diagnosis not present

## 2016-01-25 DIAGNOSIS — R509 Fever, unspecified: Secondary | ICD-10-CM | POA: Diagnosis not present

## 2016-01-25 DIAGNOSIS — J029 Acute pharyngitis, unspecified: Secondary | ICD-10-CM | POA: Diagnosis not present

## 2016-01-25 DIAGNOSIS — J209 Acute bronchitis, unspecified: Secondary | ICD-10-CM | POA: Diagnosis not present

## 2016-02-04 DIAGNOSIS — H401113 Primary open-angle glaucoma, right eye, severe stage: Secondary | ICD-10-CM | POA: Diagnosis not present

## 2016-02-04 DIAGNOSIS — H04123 Dry eye syndrome of bilateral lacrimal glands: Secondary | ICD-10-CM | POA: Diagnosis not present

## 2016-02-15 DIAGNOSIS — Z87898 Personal history of other specified conditions: Secondary | ICD-10-CM | POA: Diagnosis not present

## 2016-02-15 DIAGNOSIS — F419 Anxiety disorder, unspecified: Secondary | ICD-10-CM | POA: Diagnosis not present

## 2016-02-15 DIAGNOSIS — M109 Gout, unspecified: Secondary | ICD-10-CM | POA: Diagnosis not present

## 2016-02-15 DIAGNOSIS — I1310 Hypertensive heart and chronic kidney disease without heart failure, with stage 1 through stage 4 chronic kidney disease, or unspecified chronic kidney disease: Secondary | ICD-10-CM | POA: Diagnosis not present

## 2016-02-15 DIAGNOSIS — R05 Cough: Secondary | ICD-10-CM | POA: Diagnosis not present

## 2016-02-15 DIAGNOSIS — J029 Acute pharyngitis, unspecified: Secondary | ICD-10-CM | POA: Diagnosis not present

## 2016-02-15 DIAGNOSIS — J0140 Acute pansinusitis, unspecified: Secondary | ICD-10-CM | POA: Diagnosis not present

## 2016-02-15 DIAGNOSIS — E785 Hyperlipidemia, unspecified: Secondary | ICD-10-CM | POA: Diagnosis not present

## 2016-02-15 DIAGNOSIS — J209 Acute bronchitis, unspecified: Secondary | ICD-10-CM | POA: Diagnosis not present

## 2016-02-15 DIAGNOSIS — F5105 Insomnia due to other mental disorder: Secondary | ICD-10-CM | POA: Diagnosis not present

## 2016-02-15 DIAGNOSIS — K219 Gastro-esophageal reflux disease without esophagitis: Secondary | ICD-10-CM | POA: Diagnosis not present

## 2016-02-15 DIAGNOSIS — I10 Essential (primary) hypertension: Secondary | ICD-10-CM | POA: Diagnosis not present

## 2016-02-21 DIAGNOSIS — M5416 Radiculopathy, lumbar region: Secondary | ICD-10-CM | POA: Diagnosis not present

## 2016-02-21 DIAGNOSIS — M961 Postlaminectomy syndrome, not elsewhere classified: Secondary | ICD-10-CM | POA: Diagnosis not present

## 2016-02-21 DIAGNOSIS — G894 Chronic pain syndrome: Secondary | ICD-10-CM | POA: Diagnosis not present

## 2016-02-23 DIAGNOSIS — G4733 Obstructive sleep apnea (adult) (pediatric): Secondary | ICD-10-CM | POA: Diagnosis not present

## 2016-03-07 DIAGNOSIS — N182 Chronic kidney disease, stage 2 (mild): Secondary | ICD-10-CM | POA: Diagnosis not present

## 2016-03-07 DIAGNOSIS — M7989 Other specified soft tissue disorders: Secondary | ICD-10-CM | POA: Diagnosis not present

## 2016-03-07 DIAGNOSIS — R350 Frequency of micturition: Secondary | ICD-10-CM | POA: Diagnosis not present

## 2016-03-07 DIAGNOSIS — I1310 Hypertensive heart and chronic kidney disease without heart failure, with stage 1 through stage 4 chronic kidney disease, or unspecified chronic kidney disease: Secondary | ICD-10-CM | POA: Diagnosis not present

## 2016-03-07 DIAGNOSIS — J209 Acute bronchitis, unspecified: Secondary | ICD-10-CM | POA: Diagnosis not present

## 2016-03-17 DIAGNOSIS — G4733 Obstructive sleep apnea (adult) (pediatric): Secondary | ICD-10-CM | POA: Diagnosis not present

## 2016-03-20 DIAGNOSIS — M25551 Pain in right hip: Secondary | ICD-10-CM | POA: Diagnosis not present

## 2016-03-20 DIAGNOSIS — M25552 Pain in left hip: Secondary | ICD-10-CM | POA: Diagnosis not present

## 2016-03-21 DIAGNOSIS — R05 Cough: Secondary | ICD-10-CM | POA: Diagnosis not present

## 2016-03-21 DIAGNOSIS — R5383 Other fatigue: Secondary | ICD-10-CM | POA: Diagnosis not present

## 2016-03-21 DIAGNOSIS — R5381 Other malaise: Secondary | ICD-10-CM | POA: Diagnosis not present

## 2016-03-21 DIAGNOSIS — J3489 Other specified disorders of nose and nasal sinuses: Secondary | ICD-10-CM | POA: Diagnosis not present

## 2016-03-21 DIAGNOSIS — R42 Dizziness and giddiness: Secondary | ICD-10-CM | POA: Diagnosis not present

## 2016-03-25 DIAGNOSIS — Z5181 Encounter for therapeutic drug level monitoring: Secondary | ICD-10-CM | POA: Diagnosis not present

## 2016-03-25 DIAGNOSIS — M961 Postlaminectomy syndrome, not elsewhere classified: Secondary | ICD-10-CM | POA: Diagnosis not present

## 2016-03-25 DIAGNOSIS — G4733 Obstructive sleep apnea (adult) (pediatric): Secondary | ICD-10-CM | POA: Diagnosis not present

## 2016-03-25 DIAGNOSIS — G894 Chronic pain syndrome: Secondary | ICD-10-CM | POA: Diagnosis not present

## 2016-03-25 DIAGNOSIS — M5416 Radiculopathy, lumbar region: Secondary | ICD-10-CM | POA: Diagnosis not present

## 2016-03-25 DIAGNOSIS — Z79899 Other long term (current) drug therapy: Secondary | ICD-10-CM | POA: Diagnosis not present

## 2016-03-29 DIAGNOSIS — M25559 Pain in unspecified hip: Secondary | ICD-10-CM | POA: Diagnosis not present

## 2016-04-01 DIAGNOSIS — M65312 Trigger thumb, left thumb: Secondary | ICD-10-CM | POA: Diagnosis not present

## 2016-04-01 DIAGNOSIS — G5602 Carpal tunnel syndrome, left upper limb: Secondary | ICD-10-CM | POA: Diagnosis not present

## 2016-04-01 DIAGNOSIS — M65311 Trigger thumb, right thumb: Secondary | ICD-10-CM | POA: Diagnosis not present

## 2016-04-01 DIAGNOSIS — G5601 Carpal tunnel syndrome, right upper limb: Secondary | ICD-10-CM | POA: Diagnosis not present

## 2016-04-02 DIAGNOSIS — M5441 Lumbago with sciatica, right side: Secondary | ICD-10-CM | POA: Diagnosis not present

## 2016-04-02 DIAGNOSIS — M5442 Lumbago with sciatica, left side: Secondary | ICD-10-CM | POA: Diagnosis not present

## 2016-04-16 DIAGNOSIS — R110 Nausea: Secondary | ICD-10-CM | POA: Diagnosis not present

## 2016-04-16 DIAGNOSIS — R630 Anorexia: Secondary | ICD-10-CM | POA: Diagnosis not present

## 2016-04-16 DIAGNOSIS — R1084 Generalized abdominal pain: Secondary | ICD-10-CM | POA: Diagnosis not present

## 2016-04-16 DIAGNOSIS — K5900 Constipation, unspecified: Secondary | ICD-10-CM | POA: Diagnosis not present

## 2016-04-21 DIAGNOSIS — M5416 Radiculopathy, lumbar region: Secondary | ICD-10-CM | POA: Diagnosis not present

## 2016-04-21 DIAGNOSIS — M961 Postlaminectomy syndrome, not elsewhere classified: Secondary | ICD-10-CM | POA: Diagnosis not present

## 2016-04-21 DIAGNOSIS — G894 Chronic pain syndrome: Secondary | ICD-10-CM | POA: Diagnosis not present

## 2016-04-25 DIAGNOSIS — G4733 Obstructive sleep apnea (adult) (pediatric): Secondary | ICD-10-CM | POA: Diagnosis not present

## 2016-05-01 DIAGNOSIS — M25531 Pain in right wrist: Secondary | ICD-10-CM | POA: Diagnosis not present

## 2016-05-01 DIAGNOSIS — M25532 Pain in left wrist: Secondary | ICD-10-CM | POA: Diagnosis not present

## 2016-05-05 DIAGNOSIS — N182 Chronic kidney disease, stage 2 (mild): Secondary | ICD-10-CM | POA: Diagnosis not present

## 2016-05-05 DIAGNOSIS — I1310 Hypertensive heart and chronic kidney disease without heart failure, with stage 1 through stage 4 chronic kidney disease, or unspecified chronic kidney disease: Secondary | ICD-10-CM | POA: Diagnosis not present

## 2016-05-20 DIAGNOSIS — R112 Nausea with vomiting, unspecified: Secondary | ICD-10-CM | POA: Diagnosis not present

## 2016-05-20 DIAGNOSIS — R1084 Generalized abdominal pain: Secondary | ICD-10-CM | POA: Diagnosis not present

## 2016-05-20 DIAGNOSIS — K5903 Drug induced constipation: Secondary | ICD-10-CM | POA: Diagnosis not present

## 2016-05-20 DIAGNOSIS — R634 Abnormal weight loss: Secondary | ICD-10-CM | POA: Diagnosis not present

## 2016-05-20 DIAGNOSIS — R630 Anorexia: Secondary | ICD-10-CM | POA: Diagnosis not present

## 2016-05-23 DIAGNOSIS — G4733 Obstructive sleep apnea (adult) (pediatric): Secondary | ICD-10-CM | POA: Diagnosis not present

## 2016-05-23 DIAGNOSIS — R5383 Other fatigue: Secondary | ICD-10-CM | POA: Diagnosis not present

## 2016-05-24 DIAGNOSIS — R109 Unspecified abdominal pain: Secondary | ICD-10-CM | POA: Diagnosis not present

## 2016-05-24 DIAGNOSIS — R1084 Generalized abdominal pain: Secondary | ICD-10-CM | POA: Diagnosis not present

## 2016-05-24 DIAGNOSIS — Z9049 Acquired absence of other specified parts of digestive tract: Secondary | ICD-10-CM | POA: Diagnosis not present

## 2016-05-26 DIAGNOSIS — M109 Gout, unspecified: Secondary | ICD-10-CM | POA: Diagnosis not present

## 2016-05-26 DIAGNOSIS — I1310 Hypertensive heart and chronic kidney disease without heart failure, with stage 1 through stage 4 chronic kidney disease, or unspecified chronic kidney disease: Secondary | ICD-10-CM | POA: Diagnosis not present

## 2016-05-26 DIAGNOSIS — E785 Hyperlipidemia, unspecified: Secondary | ICD-10-CM | POA: Diagnosis not present

## 2016-05-26 DIAGNOSIS — Z1389 Encounter for screening for other disorder: Secondary | ICD-10-CM | POA: Diagnosis not present

## 2016-05-26 DIAGNOSIS — Z0000 Encounter for general adult medical examination without abnormal findings: Secondary | ICD-10-CM | POA: Diagnosis not present

## 2016-05-26 DIAGNOSIS — Z79899 Other long term (current) drug therapy: Secondary | ICD-10-CM | POA: Diagnosis not present

## 2016-05-26 DIAGNOSIS — E669 Obesity, unspecified: Secondary | ICD-10-CM | POA: Diagnosis not present

## 2016-05-26 DIAGNOSIS — N182 Chronic kidney disease, stage 2 (mild): Secondary | ICD-10-CM | POA: Diagnosis not present

## 2016-05-26 DIAGNOSIS — Z1211 Encounter for screening for malignant neoplasm of colon: Secondary | ICD-10-CM | POA: Diagnosis not present

## 2016-05-26 DIAGNOSIS — Z9181 History of falling: Secondary | ICD-10-CM | POA: Diagnosis not present

## 2016-05-28 DIAGNOSIS — R112 Nausea with vomiting, unspecified: Secondary | ICD-10-CM | POA: Diagnosis not present

## 2016-05-28 DIAGNOSIS — R1013 Epigastric pain: Secondary | ICD-10-CM | POA: Diagnosis not present

## 2016-05-28 DIAGNOSIS — Z791 Long term (current) use of non-steroidal anti-inflammatories (NSAID): Secondary | ICD-10-CM | POA: Diagnosis not present

## 2016-05-28 DIAGNOSIS — R1084 Generalized abdominal pain: Secondary | ICD-10-CM | POA: Diagnosis not present

## 2016-05-28 DIAGNOSIS — K317 Polyp of stomach and duodenum: Secondary | ICD-10-CM | POA: Diagnosis not present

## 2016-05-28 DIAGNOSIS — R634 Abnormal weight loss: Secondary | ICD-10-CM | POA: Diagnosis not present

## 2016-05-29 DIAGNOSIS — M65311 Trigger thumb, right thumb: Secondary | ICD-10-CM | POA: Diagnosis not present

## 2016-05-29 DIAGNOSIS — M65312 Trigger thumb, left thumb: Secondary | ICD-10-CM | POA: Diagnosis not present

## 2016-06-02 DIAGNOSIS — H401131 Primary open-angle glaucoma, bilateral, mild stage: Secondary | ICD-10-CM | POA: Diagnosis not present

## 2016-06-09 DIAGNOSIS — Z79899 Other long term (current) drug therapy: Secondary | ICD-10-CM | POA: Diagnosis not present

## 2016-06-09 DIAGNOSIS — N182 Chronic kidney disease, stage 2 (mild): Secondary | ICD-10-CM | POA: Diagnosis not present

## 2016-06-09 DIAGNOSIS — I1310 Hypertensive heart and chronic kidney disease without heart failure, with stage 1 through stage 4 chronic kidney disease, or unspecified chronic kidney disease: Secondary | ICD-10-CM | POA: Diagnosis not present

## 2016-06-16 DIAGNOSIS — M961 Postlaminectomy syndrome, not elsewhere classified: Secondary | ICD-10-CM | POA: Diagnosis not present

## 2016-06-16 DIAGNOSIS — G894 Chronic pain syndrome: Secondary | ICD-10-CM | POA: Diagnosis not present

## 2016-06-16 DIAGNOSIS — M5416 Radiculopathy, lumbar region: Secondary | ICD-10-CM | POA: Diagnosis not present

## 2016-06-16 DIAGNOSIS — Z79899 Other long term (current) drug therapy: Secondary | ICD-10-CM | POA: Diagnosis not present

## 2016-06-16 DIAGNOSIS — Z5181 Encounter for therapeutic drug level monitoring: Secondary | ICD-10-CM | POA: Diagnosis not present

## 2016-06-23 DIAGNOSIS — G4733 Obstructive sleep apnea (adult) (pediatric): Secondary | ICD-10-CM | POA: Diagnosis not present

## 2016-07-01 DIAGNOSIS — R1084 Generalized abdominal pain: Secondary | ICD-10-CM | POA: Diagnosis not present

## 2016-07-01 DIAGNOSIS — K5903 Drug induced constipation: Secondary | ICD-10-CM | POA: Diagnosis not present

## 2016-07-16 DIAGNOSIS — R222 Localized swelling, mass and lump, trunk: Secondary | ICD-10-CM | POA: Diagnosis not present

## 2016-07-16 DIAGNOSIS — R112 Nausea with vomiting, unspecified: Secondary | ICD-10-CM | POA: Diagnosis not present

## 2016-07-16 DIAGNOSIS — M549 Dorsalgia, unspecified: Secondary | ICD-10-CM | POA: Diagnosis not present

## 2016-07-16 DIAGNOSIS — Z853 Personal history of malignant neoplasm of breast: Secondary | ICD-10-CM | POA: Diagnosis not present

## 2016-07-16 DIAGNOSIS — R630 Anorexia: Secondary | ICD-10-CM | POA: Diagnosis not present

## 2016-07-16 DIAGNOSIS — G8929 Other chronic pain: Secondary | ICD-10-CM | POA: Diagnosis not present

## 2016-07-16 DIAGNOSIS — Z79891 Long term (current) use of opiate analgesic: Secondary | ICD-10-CM | POA: Diagnosis not present

## 2016-07-23 DIAGNOSIS — G4733 Obstructive sleep apnea (adult) (pediatric): Secondary | ICD-10-CM | POA: Diagnosis not present

## 2016-08-01 DIAGNOSIS — R300 Dysuria: Secondary | ICD-10-CM | POA: Diagnosis not present

## 2016-08-06 DIAGNOSIS — R1084 Generalized abdominal pain: Secondary | ICD-10-CM | POA: Diagnosis not present

## 2016-08-06 DIAGNOSIS — R159 Full incontinence of feces: Secondary | ICD-10-CM | POA: Diagnosis not present

## 2016-08-06 DIAGNOSIS — K5903 Drug induced constipation: Secondary | ICD-10-CM | POA: Diagnosis not present

## 2016-08-07 DIAGNOSIS — G894 Chronic pain syndrome: Secondary | ICD-10-CM | POA: Diagnosis not present

## 2016-08-07 DIAGNOSIS — M961 Postlaminectomy syndrome, not elsewhere classified: Secondary | ICD-10-CM | POA: Diagnosis not present

## 2016-08-07 DIAGNOSIS — M5416 Radiculopathy, lumbar region: Secondary | ICD-10-CM | POA: Diagnosis not present

## 2016-08-08 DIAGNOSIS — R911 Solitary pulmonary nodule: Secondary | ICD-10-CM | POA: Diagnosis not present

## 2016-08-08 DIAGNOSIS — C50912 Malignant neoplasm of unspecified site of left female breast: Secondary | ICD-10-CM | POA: Diagnosis not present

## 2016-08-08 DIAGNOSIS — R222 Localized swelling, mass and lump, trunk: Secondary | ICD-10-CM | POA: Diagnosis not present

## 2016-08-08 DIAGNOSIS — I700 Atherosclerosis of aorta: Secondary | ICD-10-CM | POA: Diagnosis not present

## 2016-08-08 DIAGNOSIS — R918 Other nonspecific abnormal finding of lung field: Secondary | ICD-10-CM | POA: Diagnosis not present

## 2016-08-13 DIAGNOSIS — M5416 Radiculopathy, lumbar region: Secondary | ICD-10-CM | POA: Diagnosis not present

## 2016-08-13 DIAGNOSIS — M961 Postlaminectomy syndrome, not elsewhere classified: Secondary | ICD-10-CM | POA: Diagnosis not present

## 2016-08-13 DIAGNOSIS — G894 Chronic pain syndrome: Secondary | ICD-10-CM | POA: Diagnosis not present

## 2016-08-13 DIAGNOSIS — M545 Low back pain: Secondary | ICD-10-CM | POA: Diagnosis not present

## 2016-08-15 DIAGNOSIS — D225 Melanocytic nevi of trunk: Secondary | ICD-10-CM | POA: Diagnosis not present

## 2016-08-15 DIAGNOSIS — L814 Other melanin hyperpigmentation: Secondary | ICD-10-CM | POA: Diagnosis not present

## 2016-08-15 DIAGNOSIS — L821 Other seborrheic keratosis: Secondary | ICD-10-CM | POA: Diagnosis not present

## 2016-08-23 DIAGNOSIS — G4733 Obstructive sleep apnea (adult) (pediatric): Secondary | ICD-10-CM | POA: Diagnosis not present

## 2016-09-02 DIAGNOSIS — N390 Urinary tract infection, site not specified: Secondary | ICD-10-CM | POA: Diagnosis not present

## 2016-09-02 DIAGNOSIS — R8290 Unspecified abnormal findings in urine: Secondary | ICD-10-CM | POA: Diagnosis not present

## 2016-09-11 DIAGNOSIS — Z0000 Encounter for general adult medical examination without abnormal findings: Secondary | ICD-10-CM | POA: Diagnosis not present

## 2016-09-11 DIAGNOSIS — Z6835 Body mass index (BMI) 35.0-35.9, adult: Secondary | ICD-10-CM | POA: Diagnosis not present

## 2016-09-11 DIAGNOSIS — E785 Hyperlipidemia, unspecified: Secondary | ICD-10-CM | POA: Diagnosis not present

## 2016-09-11 DIAGNOSIS — E669 Obesity, unspecified: Secondary | ICD-10-CM | POA: Diagnosis not present

## 2016-09-15 DIAGNOSIS — N390 Urinary tract infection, site not specified: Secondary | ICD-10-CM | POA: Diagnosis not present

## 2016-09-15 DIAGNOSIS — M65311 Trigger thumb, right thumb: Secondary | ICD-10-CM | POA: Diagnosis not present

## 2016-09-15 DIAGNOSIS — M65312 Trigger thumb, left thumb: Secondary | ICD-10-CM | POA: Diagnosis not present

## 2016-09-16 DIAGNOSIS — M545 Low back pain, unspecified: Secondary | ICD-10-CM

## 2016-09-16 DIAGNOSIS — G894 Chronic pain syndrome: Secondary | ICD-10-CM | POA: Diagnosis not present

## 2016-09-16 DIAGNOSIS — G8929 Other chronic pain: Secondary | ICD-10-CM

## 2016-09-16 DIAGNOSIS — M961 Postlaminectomy syndrome, not elsewhere classified: Secondary | ICD-10-CM | POA: Diagnosis not present

## 2016-09-16 DIAGNOSIS — M5416 Radiculopathy, lumbar region: Secondary | ICD-10-CM | POA: Diagnosis not present

## 2016-09-16 HISTORY — DX: Other chronic pain: G89.29

## 2016-09-16 HISTORY — DX: Low back pain, unspecified: M54.50

## 2016-10-01 DIAGNOSIS — R8290 Unspecified abnormal findings in urine: Secondary | ICD-10-CM | POA: Diagnosis not present

## 2016-10-08 DIAGNOSIS — Z5181 Encounter for therapeutic drug level monitoring: Secondary | ICD-10-CM | POA: Diagnosis not present

## 2016-10-08 DIAGNOSIS — M961 Postlaminectomy syndrome, not elsewhere classified: Secondary | ICD-10-CM | POA: Diagnosis not present

## 2016-10-08 DIAGNOSIS — M545 Low back pain: Secondary | ICD-10-CM | POA: Diagnosis not present

## 2016-10-08 DIAGNOSIS — G894 Chronic pain syndrome: Secondary | ICD-10-CM | POA: Diagnosis not present

## 2016-10-08 DIAGNOSIS — Z79899 Other long term (current) drug therapy: Secondary | ICD-10-CM | POA: Diagnosis not present

## 2016-10-08 DIAGNOSIS — M5416 Radiculopathy, lumbar region: Secondary | ICD-10-CM | POA: Diagnosis not present

## 2016-10-15 DIAGNOSIS — R5383 Other fatigue: Secondary | ICD-10-CM | POA: Diagnosis not present

## 2016-10-15 DIAGNOSIS — T24301A Burn of third degree of unspecified site of right lower limb, except ankle and foot, initial encounter: Secondary | ICD-10-CM | POA: Diagnosis not present

## 2016-10-15 DIAGNOSIS — R110 Nausea: Secondary | ICD-10-CM | POA: Diagnosis not present

## 2016-10-15 DIAGNOSIS — R0602 Shortness of breath: Secondary | ICD-10-CM | POA: Diagnosis not present

## 2016-10-15 DIAGNOSIS — R739 Hyperglycemia, unspecified: Secondary | ICD-10-CM | POA: Diagnosis not present

## 2016-10-15 DIAGNOSIS — T2115XA Burn of first degree of buttock, initial encounter: Secondary | ICD-10-CM | POA: Diagnosis not present

## 2016-10-16 DIAGNOSIS — M65311 Trigger thumb, right thumb: Secondary | ICD-10-CM | POA: Diagnosis not present

## 2016-10-22 DIAGNOSIS — G5622 Lesion of ulnar nerve, left upper limb: Secondary | ICD-10-CM | POA: Diagnosis not present

## 2016-10-22 DIAGNOSIS — G5621 Lesion of ulnar nerve, right upper limb: Secondary | ICD-10-CM | POA: Diagnosis not present

## 2016-10-30 DIAGNOSIS — M7061 Trochanteric bursitis, right hip: Secondary | ICD-10-CM | POA: Diagnosis not present

## 2016-10-30 DIAGNOSIS — M7062 Trochanteric bursitis, left hip: Secondary | ICD-10-CM | POA: Diagnosis not present

## 2016-11-12 DIAGNOSIS — M545 Low back pain: Secondary | ICD-10-CM | POA: Diagnosis not present

## 2016-11-12 DIAGNOSIS — G894 Chronic pain syndrome: Secondary | ICD-10-CM | POA: Diagnosis not present

## 2016-11-12 DIAGNOSIS — M7061 Trochanteric bursitis, right hip: Secondary | ICD-10-CM | POA: Diagnosis not present

## 2016-11-12 DIAGNOSIS — M961 Postlaminectomy syndrome, not elsewhere classified: Secondary | ICD-10-CM | POA: Diagnosis not present

## 2016-11-13 DIAGNOSIS — M961 Postlaminectomy syndrome, not elsewhere classified: Secondary | ICD-10-CM | POA: Diagnosis not present

## 2016-11-13 DIAGNOSIS — M5416 Radiculopathy, lumbar region: Secondary | ICD-10-CM | POA: Diagnosis not present

## 2016-11-26 DIAGNOSIS — G4733 Obstructive sleep apnea (adult) (pediatric): Secondary | ICD-10-CM | POA: Diagnosis not present

## 2016-12-10 DIAGNOSIS — M5416 Radiculopathy, lumbar region: Secondary | ICD-10-CM | POA: Diagnosis not present

## 2016-12-10 DIAGNOSIS — G894 Chronic pain syndrome: Secondary | ICD-10-CM | POA: Diagnosis not present

## 2016-12-10 DIAGNOSIS — M545 Low back pain: Secondary | ICD-10-CM | POA: Diagnosis not present

## 2016-12-10 DIAGNOSIS — M961 Postlaminectomy syndrome, not elsewhere classified: Secondary | ICD-10-CM | POA: Diagnosis not present

## 2016-12-17 DIAGNOSIS — I1310 Hypertensive heart and chronic kidney disease without heart failure, with stage 1 through stage 4 chronic kidney disease, or unspecified chronic kidney disease: Secondary | ICD-10-CM | POA: Diagnosis not present

## 2016-12-17 DIAGNOSIS — N182 Chronic kidney disease, stage 2 (mild): Secondary | ICD-10-CM | POA: Diagnosis not present

## 2016-12-17 DIAGNOSIS — Z23 Encounter for immunization: Secondary | ICD-10-CM | POA: Diagnosis not present

## 2016-12-17 DIAGNOSIS — E785 Hyperlipidemia, unspecified: Secondary | ICD-10-CM | POA: Diagnosis not present

## 2016-12-17 DIAGNOSIS — R739 Hyperglycemia, unspecified: Secondary | ICD-10-CM | POA: Diagnosis not present

## 2017-01-14 DIAGNOSIS — Z9889 Other specified postprocedural states: Secondary | ICD-10-CM | POA: Diagnosis not present

## 2017-01-14 DIAGNOSIS — M961 Postlaminectomy syndrome, not elsewhere classified: Secondary | ICD-10-CM | POA: Diagnosis not present

## 2017-01-14 DIAGNOSIS — G894 Chronic pain syndrome: Secondary | ICD-10-CM | POA: Diagnosis not present

## 2017-01-14 DIAGNOSIS — M5416 Radiculopathy, lumbar region: Secondary | ICD-10-CM | POA: Diagnosis not present

## 2017-01-14 DIAGNOSIS — Z853 Personal history of malignant neoplasm of breast: Secondary | ICD-10-CM | POA: Diagnosis not present

## 2017-01-14 DIAGNOSIS — R928 Other abnormal and inconclusive findings on diagnostic imaging of breast: Secondary | ICD-10-CM | POA: Diagnosis not present

## 2017-01-20 DIAGNOSIS — Z853 Personal history of malignant neoplasm of breast: Secondary | ICD-10-CM | POA: Diagnosis not present

## 2017-02-04 DIAGNOSIS — H26493 Other secondary cataract, bilateral: Secondary | ICD-10-CM | POA: Diagnosis not present

## 2017-02-04 DIAGNOSIS — H5202 Hypermetropia, left eye: Secondary | ICD-10-CM | POA: Diagnosis not present

## 2017-02-04 DIAGNOSIS — H04123 Dry eye syndrome of bilateral lacrimal glands: Secondary | ICD-10-CM | POA: Diagnosis not present

## 2017-02-04 DIAGNOSIS — H401113 Primary open-angle glaucoma, right eye, severe stage: Secondary | ICD-10-CM | POA: Diagnosis not present

## 2017-02-05 DIAGNOSIS — M5416 Radiculopathy, lumbar region: Secondary | ICD-10-CM | POA: Diagnosis not present

## 2017-02-05 DIAGNOSIS — M961 Postlaminectomy syndrome, not elsewhere classified: Secondary | ICD-10-CM | POA: Diagnosis not present

## 2017-02-05 DIAGNOSIS — M545 Low back pain: Secondary | ICD-10-CM | POA: Diagnosis not present

## 2017-02-05 DIAGNOSIS — Z79899 Other long term (current) drug therapy: Secondary | ICD-10-CM | POA: Diagnosis not present

## 2017-02-05 DIAGNOSIS — G894 Chronic pain syndrome: Secondary | ICD-10-CM | POA: Diagnosis not present

## 2017-02-05 DIAGNOSIS — Z5181 Encounter for therapeutic drug level monitoring: Secondary | ICD-10-CM | POA: Diagnosis not present

## 2017-02-18 DIAGNOSIS — H401113 Primary open-angle glaucoma, right eye, severe stage: Secondary | ICD-10-CM | POA: Diagnosis not present

## 2017-02-18 DIAGNOSIS — H04123 Dry eye syndrome of bilateral lacrimal glands: Secondary | ICD-10-CM | POA: Diagnosis not present

## 2017-03-12 DIAGNOSIS — G894 Chronic pain syndrome: Secondary | ICD-10-CM | POA: Diagnosis not present

## 2017-03-12 DIAGNOSIS — M961 Postlaminectomy syndrome, not elsewhere classified: Secondary | ICD-10-CM | POA: Diagnosis not present

## 2017-03-12 DIAGNOSIS — M5416 Radiculopathy, lumbar region: Secondary | ICD-10-CM | POA: Diagnosis not present

## 2017-03-12 DIAGNOSIS — M545 Low back pain: Secondary | ICD-10-CM | POA: Diagnosis not present

## 2017-03-13 DIAGNOSIS — M961 Postlaminectomy syndrome, not elsewhere classified: Secondary | ICD-10-CM | POA: Diagnosis not present

## 2017-03-13 DIAGNOSIS — M5416 Radiculopathy, lumbar region: Secondary | ICD-10-CM | POA: Diagnosis not present

## 2017-03-13 DIAGNOSIS — G894 Chronic pain syndrome: Secondary | ICD-10-CM | POA: Diagnosis not present

## 2017-03-27 DIAGNOSIS — G894 Chronic pain syndrome: Secondary | ICD-10-CM | POA: Diagnosis not present

## 2017-03-27 DIAGNOSIS — M5416 Radiculopathy, lumbar region: Secondary | ICD-10-CM | POA: Diagnosis not present

## 2017-03-27 DIAGNOSIS — M5137 Other intervertebral disc degeneration, lumbosacral region: Secondary | ICD-10-CM | POA: Diagnosis not present

## 2017-03-27 DIAGNOSIS — Z981 Arthrodesis status: Secondary | ICD-10-CM | POA: Diagnosis not present

## 2017-03-27 DIAGNOSIS — M48061 Spinal stenosis, lumbar region without neurogenic claudication: Secondary | ICD-10-CM | POA: Diagnosis not present

## 2017-03-27 DIAGNOSIS — M961 Postlaminectomy syndrome, not elsewhere classified: Secondary | ICD-10-CM | POA: Diagnosis not present

## 2017-04-03 DIAGNOSIS — J111 Influenza due to unidentified influenza virus with other respiratory manifestations: Secondary | ICD-10-CM | POA: Diagnosis not present

## 2017-04-10 DIAGNOSIS — R300 Dysuria: Secondary | ICD-10-CM | POA: Diagnosis not present

## 2017-04-10 DIAGNOSIS — R05 Cough: Secondary | ICD-10-CM | POA: Diagnosis not present

## 2017-04-15 DIAGNOSIS — M545 Low back pain: Secondary | ICD-10-CM | POA: Diagnosis not present

## 2017-04-15 DIAGNOSIS — G894 Chronic pain syndrome: Secondary | ICD-10-CM | POA: Diagnosis not present

## 2017-04-15 DIAGNOSIS — M961 Postlaminectomy syndrome, not elsewhere classified: Secondary | ICD-10-CM | POA: Diagnosis not present

## 2017-04-15 DIAGNOSIS — M5416 Radiculopathy, lumbar region: Secondary | ICD-10-CM | POA: Diagnosis not present

## 2017-04-28 DIAGNOSIS — A499 Bacterial infection, unspecified: Secondary | ICD-10-CM | POA: Diagnosis not present

## 2017-04-28 DIAGNOSIS — R5383 Other fatigue: Secondary | ICD-10-CM | POA: Diagnosis not present

## 2017-04-28 DIAGNOSIS — M791 Myalgia, unspecified site: Secondary | ICD-10-CM | POA: Diagnosis not present

## 2017-04-28 DIAGNOSIS — L600 Ingrowing nail: Secondary | ICD-10-CM | POA: Diagnosis not present

## 2017-04-28 DIAGNOSIS — E559 Vitamin D deficiency, unspecified: Secondary | ICD-10-CM | POA: Diagnosis not present

## 2017-04-28 DIAGNOSIS — N390 Urinary tract infection, site not specified: Secondary | ICD-10-CM | POA: Diagnosis not present

## 2017-04-28 DIAGNOSIS — Z79899 Other long term (current) drug therapy: Secondary | ICD-10-CM | POA: Diagnosis not present

## 2017-04-30 DIAGNOSIS — G894 Chronic pain syndrome: Secondary | ICD-10-CM | POA: Diagnosis not present

## 2017-04-30 DIAGNOSIS — M5416 Radiculopathy, lumbar region: Secondary | ICD-10-CM | POA: Diagnosis not present

## 2017-04-30 DIAGNOSIS — Z79899 Other long term (current) drug therapy: Secondary | ICD-10-CM | POA: Diagnosis not present

## 2017-04-30 DIAGNOSIS — M545 Low back pain: Secondary | ICD-10-CM | POA: Diagnosis not present

## 2017-04-30 DIAGNOSIS — Z5181 Encounter for therapeutic drug level monitoring: Secondary | ICD-10-CM | POA: Diagnosis not present

## 2017-04-30 DIAGNOSIS — M961 Postlaminectomy syndrome, not elsewhere classified: Secondary | ICD-10-CM | POA: Diagnosis not present

## 2017-05-04 DIAGNOSIS — L600 Ingrowing nail: Secondary | ICD-10-CM | POA: Diagnosis not present

## 2017-05-04 DIAGNOSIS — L03031 Cellulitis of right toe: Secondary | ICD-10-CM

## 2017-05-04 DIAGNOSIS — M545 Low back pain: Secondary | ICD-10-CM | POA: Diagnosis not present

## 2017-05-04 HISTORY — DX: Cellulitis of right toe: L03.031

## 2017-05-04 HISTORY — DX: Ingrowing nail: L60.0

## 2017-05-06 DIAGNOSIS — I1310 Hypertensive heart and chronic kidney disease without heart failure, with stage 1 through stage 4 chronic kidney disease, or unspecified chronic kidney disease: Secondary | ICD-10-CM | POA: Diagnosis not present

## 2017-05-06 DIAGNOSIS — N182 Chronic kidney disease, stage 2 (mild): Secondary | ICD-10-CM | POA: Diagnosis not present

## 2017-05-13 ENCOUNTER — Other Ambulatory Visit: Payer: Self-pay | Admitting: Orthopedic Surgery

## 2017-05-13 DIAGNOSIS — M545 Low back pain: Principal | ICD-10-CM

## 2017-05-13 DIAGNOSIS — G8929 Other chronic pain: Secondary | ICD-10-CM

## 2017-05-14 DIAGNOSIS — N182 Chronic kidney disease, stage 2 (mild): Secondary | ICD-10-CM | POA: Diagnosis not present

## 2017-05-14 DIAGNOSIS — I1310 Hypertensive heart and chronic kidney disease without heart failure, with stage 1 through stage 4 chronic kidney disease, or unspecified chronic kidney disease: Secondary | ICD-10-CM | POA: Diagnosis not present

## 2017-05-26 ENCOUNTER — Ambulatory Visit
Admission: RE | Admit: 2017-05-26 | Discharge: 2017-05-26 | Disposition: A | Discharge status: Home / Self Care | Payer: PPO | Source: Ambulatory Visit | Attending: Orthopedic Surgery | Admitting: Orthopedic Surgery

## 2017-05-26 DIAGNOSIS — G8929 Other chronic pain: Secondary | ICD-10-CM

## 2017-05-26 DIAGNOSIS — M4807 Spinal stenosis, lumbosacral region: Secondary | ICD-10-CM | POA: Diagnosis not present

## 2017-05-26 DIAGNOSIS — M545 Low back pain: Principal | ICD-10-CM

## 2017-05-26 MED ORDER — DIAZEPAM 5 MG PO TABS: 5.0000 mg | ORAL_TABLET | Freq: Once | ORAL | Status: DC

## 2017-05-26 MED ORDER — DIAZEPAM 5 MG PO TABS
10.0000 mg | ORAL_TABLET | Freq: Once | ORAL | Status: AC
  Administered 2017-05-26: 10 mg via ORAL

## 2017-05-26 MED ORDER — MEPERIDINE HCL 50 MG/ML IJ SOLN
50.0000 mg | Freq: Once | INTRAMUSCULAR | Status: AC
  Administered 2017-05-26: 50 mg via INTRAMUSCULAR

## 2017-05-26 MED ORDER — IOPAMIDOL (ISOVUE-M 200) INJECTION 41%
15.0000 mL | Freq: Once | INTRAMUSCULAR | Status: AC
  Administered 2017-05-26: 15 mL via INTRATHECAL

## 2017-05-26 MED ORDER — ONDANSETRON HCL 4 MG/2ML IJ SOLN
4.0000 mg | Freq: Once | INTRAMUSCULAR | Status: AC
  Administered 2017-05-26: 4 mg via INTRAMUSCULAR

## 2017-05-26 NOTE — Discharge Instructions (Signed)

## 2017-05-29 DIAGNOSIS — M5416 Radiculopathy, lumbar region: Secondary | ICD-10-CM | POA: Diagnosis not present

## 2017-06-01 DIAGNOSIS — H01009 Unspecified blepharitis unspecified eye, unspecified eyelid: Secondary | ICD-10-CM | POA: Diagnosis not present

## 2017-06-01 DIAGNOSIS — H16223 Keratoconjunctivitis sicca, not specified as Sjogren's, bilateral: Secondary | ICD-10-CM | POA: Diagnosis not present

## 2017-06-01 DIAGNOSIS — I10 Essential (primary) hypertension: Secondary | ICD-10-CM | POA: Diagnosis not present

## 2017-06-29 ENCOUNTER — Other Ambulatory Visit: Payer: Self-pay | Admitting: Orthopedic Surgery

## 2017-06-30 DIAGNOSIS — M5416 Radiculopathy, lumbar region: Secondary | ICD-10-CM | POA: Diagnosis not present

## 2017-07-01 ENCOUNTER — Inpatient Hospital Stay (HOSPITAL_COMMUNITY): Admission: RE | Admit: 2017-07-01 | Payer: PPO | Source: Ambulatory Visit

## 2017-07-01 DIAGNOSIS — I10 Essential (primary) hypertension: Secondary | ICD-10-CM | POA: Diagnosis not present

## 2017-07-01 DIAGNOSIS — N3000 Acute cystitis without hematuria: Secondary | ICD-10-CM | POA: Diagnosis not present

## 2017-07-01 DIAGNOSIS — H16223 Keratoconjunctivitis sicca, not specified as Sjogren's, bilateral: Secondary | ICD-10-CM | POA: Diagnosis not present

## 2017-07-01 DIAGNOSIS — H01009 Unspecified blepharitis unspecified eye, unspecified eyelid: Secondary | ICD-10-CM | POA: Diagnosis not present

## 2017-07-01 NOTE — Pre-Procedure Instructions (Signed)
FALESHA SCHOMMER  07/01/2017      [id number] - [place], [place] - [address] [facility] [place] [address] Phone: [phone] Fax: [phone]    Your procedure is scheduled on May 2.  Report to [place] at [phone].  Call this number if you have problems the morning of surgery:  [phone]   Remember:  Do not eat food or drink liquids after midnight.  Take these medicines the morning of surgery with A SIP OF WATER  Allopurinol (Zyloprim) Amlodipine (norvasc) Restasis eye drops Lansoprazole (Prevacid) Morphine (Ms Contin) Timolol (Betimol) eye dropsA    Stop taking aspirin as directed by your Dr Stop taking BC's, Goody's, Herbal medications, Fish Oil, Aleve, Ibuprofen, Advil, Motrin, Vitamins    Do not wear jewelry, make-up or nail polish.  Do not wear lotions, powders, or perfumes, or deodorant.  Do not shave 48 hours prior to surgery.  Men may shave face and neck.  Do not bring valuables to the hospital.  [facility] is not responsible for any belongings or valuables.  Contacts, dentures or bridgework may not be worn into surgery.  Leave your suitcase in the car.  After surgery it may be brought to your room.  For patients admitted to the hospital, discharge time will be determined by your treatment team.  Patients discharged the day of surgery will not be allowed to drive home.    Special instructions:  [place]- Preparing For Surgery  Before surgery, you can play an important role. Because skin is not sterile, your skin needs to be as free of germs as possible. You can reduce the number of germs on your skin by washing with CHG (chlorahexidine gluconate) Soap before surgery.  CHG is an antiseptic cleaner which kills germs and bonds with the skin to continue killing germs even after washing.  Please do not use if you have an allergy to CHG or antibacterial soaps. If your skin becomes reddened/irritated stop using  the CHG.  Do not shave (including legs and underarms) for at least 48 hours prior to first CHG shower. It is OK to shave your face.  Please follow these instructions carefully.   1. Shower the NIGHT BEFORE SURGERY and the MORNING OF SURGERY with CHG.   2. If you chose to wash your hair, wash your hair first as usual with your normal shampoo.  3. After you shampoo, rinse your hair and body thoroughly to remove the shampoo.  4. Use CHG as you would any other liquid soap. You can apply CHG directly to the skin and wash gently with a scrungie or a clean washcloth.   5. Apply the CHG Soap to your body ONLY FROM THE NECK DOWN.  Do not use on open wounds or open sores. Avoid contact with your eyes, ears, mouth and genitals (private parts). Wash Face and genitals (private parts)  with your normal soap.  6. Wash thoroughly, paying special attention to the area where your surgery will be performed.  7. Thoroughly rinse your body with warm water from the neck down.  8. DO NOT shower/wash with your normal soap after using and rinsing off the CHG Soap.  9. Pat yourself dry with a CLEAN TOWEL.  10. Wear CLEAN PAJAMAS to bed the night before surgery, wear comfortable clothes the morning of surgery  11. Place CLEAN SHEETS on your bed the night of your first shower and DO NOT SLEEP WITH PETS.  Day of Surgery: Do not apply any deodorants/lotions. Please wear clean clothes to the hospital/surgery center.      Please read over the following fact sheets that you were given. Pain Booklet, Coughing and Deep Breathing, MRSA Information and Surgical Site Infection Prevention

## 2017-07-02 ENCOUNTER — Encounter (HOSPITAL_COMMUNITY)
Admission: RE | Admit: 2017-07-02 | Discharge: 2017-07-02 | Disposition: A | Discharge status: Home / Self Care | Payer: PPO | Source: Ambulatory Visit | Attending: Orthopedic Surgery | Admitting: Orthopedic Surgery

## 2017-07-02 ENCOUNTER — Encounter (HOSPITAL_COMMUNITY): Payer: Self-pay

## 2017-07-02 ENCOUNTER — Other Ambulatory Visit: Payer: Self-pay

## 2017-07-02 DIAGNOSIS — Z01812 Encounter for preprocedural laboratory examination: Secondary | ICD-10-CM | POA: Diagnosis not present

## 2017-07-02 DIAGNOSIS — I10 Essential (primary) hypertension: Secondary | ICD-10-CM | POA: Diagnosis not present

## 2017-07-02 DIAGNOSIS — Z01810 Encounter for preprocedural cardiovascular examination: Secondary | ICD-10-CM | POA: Diagnosis not present

## 2017-07-02 LAB — CBC
HCT: 39.8 % (ref 36.0–46.0)
Hemoglobin: 13.1 g/dL (ref 12.0–15.0)
MCH: 29.3 pg (ref 26.0–34.0)
MCHC: 32.9 g/dL (ref 30.0–36.0)
MCV: 89 fL (ref 78.0–100.0)
PLATELETS: 304 10*3/uL (ref 150–400)
RBC: 4.47 MIL/uL (ref 3.87–5.11)
RDW: 12.6 % (ref 11.5–15.5)
WBC: 6.8 10*3/uL (ref 4.0–10.5)

## 2017-07-02 LAB — BASIC METABOLIC PANEL
Anion gap: 11 (ref 5–15)
BUN: 8 mg/dL (ref 6–20)
CALCIUM: 9.7 mg/dL (ref 8.9–10.3)
CO2: 24 mmol/L (ref 22–32)
Chloride: 95 mmol/L — ABNORMAL LOW (ref 101–111)
Creatinine, Ser: 0.82 mg/dL (ref 0.44–1.00)
GFR calc non Af Amer: >60 mL/min (ref >60)
Glucose, Bld: 111 mg/dL — ABNORMAL HIGH (ref 65–99)
Potassium: 4.2 mmol/L (ref 3.5–5.1)
SODIUM: 130 mmol/L — AB (ref 135–145)

## 2017-07-02 LAB — TYPE AND SCREEN
ABO/RH(D): O POS
ANTIBODY SCREEN: NEGATIVE

## 2017-07-02 LAB — SURGICAL PCR SCREEN
MRSA, PCR: NEGATIVE
Staphylococcus aureus: NEGATIVE

## 2017-07-02 NOTE — Pre-Procedure Instructions (Signed)
Karen Castillo  07/02/2017      [id number] - [place], [place] - [address] [facility] [place] [address] Phone: [phone] Fax: [phone]    Your procedure is scheduled on May 2.  Report to [place] at [phone].M.  Call this number if you have problems the morning of surgery:  [phone]   Remember:  Do not eat food or drink liquids after midnight.  Take these medicines the morning of surgery with A SIP OF WATER  Allopurinol (Zyloprim) Amlodipine (norvasc) Restasis eye drops Lansoprazole (Prevacid) Morphine (Ms Contin) Timolol (Betimol) eye dropsA    7 days prior to surgery STOP taking any Aspirin(unless otherwise instructed by your surgeon), Aleve, Naproxen, Ibuprofen, Motrin, Advil, Goody's, BC's, all herbal medications, fish oil, and all vitamins  Follow your doctors instructions regarding your Aspirin.  If no instructions were given by your doctor, then you will need to call the prescribing office office to get instructions.     Do not wear jewelry, make-up or nail polish.  Do not wear lotions, powders, or perfumes, or deodorant.  Do not shave 48 hours prior to surgery.    Do not bring valuables to the hospital.  [facility] is not responsible for any belongings or valuables.  Contacts, dentures or bridgework may not be worn into surgery.  Leave your suitcase in the car.  After surgery it may be brought to your room.  For patients admitted to the hospital, discharge time will be determined by your treatment team.  Patients discharged the day of surgery will not be allowed to drive home.    Special instructions:  [place]- Preparing For Surgery  Before surgery, you can play an important role. Because skin is not sterile, your skin needs to be as free of germs as possible. You can reduce the number of germs on your skin by washing with CHG (chlorahexidine gluconate) Soap before surgery.  CHG is an antiseptic  cleaner which kills germs and bonds with the skin to continue killing germs even after washing.  Please do not use if you have an allergy to CHG or antibacterial soaps. If your skin becomes reddened/irritated stop using the CHG.  Do not shave (including legs and underarms) for at least 48 hours prior to first CHG shower. It is OK to shave your face.  Please follow these instructions carefully.   1. Shower the NIGHT BEFORE SURGERY and the MORNING OF SURGERY with CHG.   2. If you chose to wash your hair, wash your hair first as usual with your normal shampoo.  3. After you shampoo, rinse your hair and body thoroughly to remove the shampoo.  4. Use CHG as you would any other liquid soap. You can apply CHG directly to the skin and wash gently with a scrungie or a clean washcloth.   5. Apply the CHG Soap to your body ONLY FROM THE NECK DOWN.  Do not use on open wounds or open sores. Avoid contact with your eyes, ears, mouth and genitals (private parts). Wash Face and genitals (private parts)  with your normal soap.  6. Wash thoroughly, paying special attention to the area where your surgery will be performed.  7. Thoroughly rinse your body with warm water from the neck down.  8. DO NOT shower/wash with your normal soap after using and rinsing off the CHG Soap.  9. Pat yourself dry with a CLEAN TOWEL.  10. Wear CLEAN PAJAMAS  to bed the night before surgery, wear comfortable clothes the morning of surgery  11. Place CLEAN SHEETS on your bed the night of your first shower and DO NOT SLEEP WITH PETS.    Day of Surgery: Do not apply any deodorants/lotions. Please wear clean clothes to the hospital/surgery center.      Please read over the following fact sheets that you were given. Pain Booklet, Coughing and Deep Breathing, MRSA Information and Surgical Site Infection Prevention

## 2017-07-02 NOTE — Progress Notes (Signed)
PCP - Dr, Carolanne Grumbling office Cardiologist - denies  Chest x-ray - not needed EKG - 07/02/17 Stress Test - denies ECHO - denies Cardiac Cath - denies  Sleep Study - 2009 CPAP - at night instructed to bring mask and tubing  Aspirin Instructions: last dose 4/23  Anesthesia review: NO  Patient denies shortness of breath, fever, cough and chest pain at PAT appointment   Patient verbalized understanding of instructions that were given to them at the PAT appointment. Patient was also instructed that they will need to review over the PAT instructions again at home before surgery.

## 2017-07-08 ENCOUNTER — Encounter (HOSPITAL_COMMUNITY): Payer: Self-pay | Admitting: Anesthesiology

## 2017-07-08 HISTORY — PX: BACK SURGERY: SHX140

## 2017-07-08 HISTORY — PX: ANTERIOR LUMBAR FUSION: SHX1170

## 2017-07-08 MED ORDER — VANCOMYCIN HCL 10 G IV SOLR
1500.0000 mg | INTRAVENOUS | Status: AC
  Administered 2017-07-09: 1500 mg via INTRAVENOUS
  Filled 2017-07-08: qty 1500

## 2017-07-08 NOTE — Anesthesia Preprocedure Evaluation (Addendum)
Anesthesia Evaluation  Patient identified by MRN, date of birth, ID band Patient awake    Reviewed: Allergy & Precautions, NPO status , Patient's Chart, lab work & pertinent test results, reviewed documented beta blocker date and time   Airway Mallampati: III  TM Distance: >3 FB Neck ROM: Full    Dental no notable dental hx. (+) Teeth Intact, Dental Advisory Given   Pulmonary neg pulmonary ROS, sleep apnea and Continuous Positive Airway Pressure Ventilation ,    Pulmonary exam normal breath sounds clear to auscultation       Cardiovascular hypertension, Pt. on medications and Pt. on home beta blockers Normal cardiovascular exam Rhythm:Regular Rate:Normal     Neuro/Psych PSYCHIATRIC DISORDERS Bipolar Disorder Glaucoma  Neuromuscular disease    GI/Hepatic Neg liver ROS, GERD  Medicated and Controlled,  Endo/Other  Hyperlipidemia  Renal/GU negative Renal ROS  negative genitourinary   Musculoskeletal  (+) Arthritis , Left leg pain   Abdominal (+) + obese,   Peds  Hematology negative hematology ROS (+)   Anesthesia Other Findings   Reproductive/Obstetrics                            Anesthesia Physical Anesthesia Plan  ASA: III  Anesthesia Plan: General   Post-op Pain Management:    Induction: Intravenous  PONV Risk Score and Plan: 4 or greater and Ondansetron, Dexamethasone, Midazolam and Treatment may vary due to age or medical condition  Airway Management Planned: Oral ETT  Additional Equipment:   Intra-op Plan:   Post-operative Plan: Extubation in OR  Informed Consent: I have reviewed the patients History and Physical, chart, labs and discussed the procedure including the risks, benefits and alternatives for the proposed anesthesia with the patient or authorized representative who has indicated his/her understanding and acceptance.   Dental advisory given  Plan Discussed with:  CRNA, Anesthesiologist and Surgeon  Anesthesia Plan Comments:        Anesthesia Quick Evaluation

## 2017-07-09 ENCOUNTER — Inpatient Hospital Stay (HOSPITAL_COMMUNITY): Payer: PPO

## 2017-07-09 ENCOUNTER — Inpatient Hospital Stay (HOSPITAL_COMMUNITY): Payer: PPO | Admitting: Certified Registered"

## 2017-07-09 ENCOUNTER — Encounter (HOSPITAL_COMMUNITY)
Admission: RE | Disposition: A | Discharge status: Home / Self Care | Payer: Self-pay | Source: Ambulatory Visit | Attending: Orthopedic Surgery

## 2017-07-09 ENCOUNTER — Encounter (HOSPITAL_COMMUNITY): Payer: Self-pay | Admitting: Urology

## 2017-07-09 ENCOUNTER — Inpatient Hospital Stay (HOSPITAL_COMMUNITY)
Admission: RE | Admit: 2017-07-09 | Discharge: 2017-07-11 | DRG: 460 | Disposition: A | Discharge status: Home / Self Care | Payer: PPO | Source: Ambulatory Visit | Attending: Orthopedic Surgery | Admitting: Orthopedic Surgery

## 2017-07-09 DIAGNOSIS — G4730 Sleep apnea, unspecified: Secondary | ICD-10-CM | POA: Diagnosis not present

## 2017-07-09 DIAGNOSIS — S3210XK Unspecified fracture of sacrum, subsequent encounter for fracture with nonunion: Secondary | ICD-10-CM | POA: Diagnosis not present

## 2017-07-09 DIAGNOSIS — K219 Gastro-esophageal reflux disease without esophagitis: Secondary | ICD-10-CM | POA: Diagnosis not present

## 2017-07-09 DIAGNOSIS — Z01818 Encounter for other preprocedural examination: Secondary | ICD-10-CM

## 2017-07-09 DIAGNOSIS — M5116 Intervertebral disc disorders with radiculopathy, lumbar region: Secondary | ICD-10-CM | POA: Diagnosis present

## 2017-07-09 DIAGNOSIS — M5410 Radiculopathy, site unspecified: Secondary | ICD-10-CM | POA: Diagnosis present

## 2017-07-09 DIAGNOSIS — Z85828 Personal history of other malignant neoplasm of skin: Secondary | ICD-10-CM

## 2017-07-09 DIAGNOSIS — I10 Essential (primary) hypertension: Secondary | ICD-10-CM | POA: Diagnosis present

## 2017-07-09 DIAGNOSIS — M4326 Fusion of spine, lumbar region: Secondary | ICD-10-CM | POA: Diagnosis not present

## 2017-07-09 DIAGNOSIS — M79605 Pain in left leg: Secondary | ICD-10-CM | POA: Diagnosis not present

## 2017-07-09 DIAGNOSIS — Z7982 Long term (current) use of aspirin: Secondary | ICD-10-CM | POA: Diagnosis not present

## 2017-07-09 DIAGNOSIS — Y838 Other surgical procedures as the cause of abnormal reaction of the patient, or of later complication, without mention of misadventure at the time of the procedure: Secondary | ICD-10-CM | POA: Diagnosis present

## 2017-07-09 DIAGNOSIS — M960 Pseudarthrosis after fusion or arthrodesis: Principal | ICD-10-CM | POA: Diagnosis present

## 2017-07-09 DIAGNOSIS — Z79899 Other long term (current) drug therapy: Secondary | ICD-10-CM | POA: Diagnosis not present

## 2017-07-09 DIAGNOSIS — Z981 Arthrodesis status: Secondary | ICD-10-CM | POA: Diagnosis not present

## 2017-07-09 DIAGNOSIS — S32009K Unspecified fracture of unspecified lumbar vertebra, subsequent encounter for fracture with nonunion: Secondary | ICD-10-CM

## 2017-07-09 DIAGNOSIS — Z472 Encounter for removal of internal fixation device: Secondary | ICD-10-CM | POA: Diagnosis not present

## 2017-07-09 DIAGNOSIS — Z419 Encounter for procedure for purposes other than remedying health state, unspecified: Secondary | ICD-10-CM

## 2017-07-09 DIAGNOSIS — S32059K Unspecified fracture of fifth lumbar vertebra, subsequent encounter for fracture with nonunion: Secondary | ICD-10-CM | POA: Diagnosis not present

## 2017-07-09 DIAGNOSIS — J8410 Pulmonary fibrosis, unspecified: Secondary | ICD-10-CM | POA: Diagnosis not present

## 2017-07-09 LAB — DIFFERENTIAL
Basophils Absolute: 0 10*3/uL (ref 0.0–0.1)
Basophils Relative: 0 %
EOS ABS: 0.2 10*3/uL (ref 0.0–0.7)
EOS PCT: 3 %
LYMPHS ABS: 2.4 10*3/uL (ref 0.7–4.0)
Lymphocytes Relative: 32 %
MONO ABS: 0.7 10*3/uL (ref 0.1–1.0)
MONOS PCT: 9 %
Neutro Abs: 4.2 10*3/uL (ref 1.7–7.7)
Neutrophils Relative %: 56 %

## 2017-07-09 LAB — APTT: APTT: 27 s (ref 24–36)

## 2017-07-09 LAB — URINALYSIS, ROUTINE W REFLEX MICROSCOPIC
BILIRUBIN URINE: NEGATIVE
GLUCOSE, UA: NEGATIVE mg/dL
Hgb urine dipstick: NEGATIVE
KETONES UR: NEGATIVE mg/dL
Leukocytes, UA: NEGATIVE
Nitrite: NEGATIVE
Protein, ur: NEGATIVE mg/dL
SPECIFIC GRAVITY, URINE: 1.006 (ref 1.005–1.030)
pH: 7 (ref 5.0–8.0)

## 2017-07-09 LAB — HEPATIC FUNCTION PANEL
ALT: 20 U/L (ref 14–54)
AST: 21 U/L (ref 15–41)
Albumin: 3.7 g/dL (ref 3.5–5.0)
Alkaline Phosphatase: 90 U/L (ref 38–126)
Total Bilirubin: 0.4 mg/dL (ref 0.3–1.2)
Total Protein: 7.2 g/dL (ref 6.5–8.1)

## 2017-07-09 LAB — POCT I-STAT 4, (NA,K, GLUC, HGB,HCT)
GLUCOSE: 110 mg/dL — AB (ref 65–99)
HEMATOCRIT: 37 % (ref 36.0–46.0)
Hemoglobin: 12.6 g/dL (ref 12.0–15.0)
Potassium: 3.7 mmol/L (ref 3.5–5.1)
Sodium: 134 mmol/L — ABNORMAL LOW (ref 135–145)

## 2017-07-09 LAB — PROTIME-INR
INR: 0.97
PROTHROMBIN TIME: 12.8 s (ref 11.4–15.2)

## 2017-07-09 SURGERY — POSTERIOR LUMBAR FUSION 1 LEVEL
Anesthesia: General | Laterality: Left

## 2017-07-09 MED ORDER — LIDOCAINE 2% (20 MG/ML) 5 ML SYRINGE
INTRAMUSCULAR | Status: AC
  Filled 2017-07-09: qty 5

## 2017-07-09 MED ORDER — VITAMIN D 1000 UNITS PO TABS
2000.0000 [IU] | ORAL_TABLET | Freq: Every day | ORAL | Status: DC
  Administered 2017-07-09 – 2017-07-10 (×2): 2000 [IU] via ORAL
  Filled 2017-07-09 (×2): qty 2

## 2017-07-09 MED ORDER — NORTRIPTYLINE HCL 10 MG PO CAPS
20.0000 mg | ORAL_CAPSULE | Freq: Every day | ORAL | Status: DC
  Administered 2017-07-09 – 2017-07-10 (×2): 20 mg via ORAL
  Filled 2017-07-09 (×2): qty 2

## 2017-07-09 MED ORDER — POVIDONE-IODINE 7.5 % EX SOLN
Freq: Once | CUTANEOUS | Status: DC
  Filled 2017-07-09: qty 118

## 2017-07-09 MED ORDER — MORPHINE SULFATE ER 30 MG PO TBCR
30.0000 mg | EXTENDED_RELEASE_TABLET | Freq: Two times a day (BID) | ORAL | Status: DC
Start: 2017-07-09 — End: 2017-07-11
  Administered 2017-07-09 – 2017-07-10 (×2): 30 mg via ORAL
  Filled 2017-07-09 (×2): qty 1

## 2017-07-09 MED ORDER — HYDROCHLOROTHIAZIDE 25 MG PO TABS
25.0000 mg | ORAL_TABLET | Freq: Every day | ORAL | Status: DC
  Administered 2017-07-10: 25 mg via ORAL
  Filled 2017-07-09: qty 1

## 2017-07-09 MED ORDER — PROPOFOL 10 MG/ML IV BOLUS
INTRAVENOUS | Status: AC
  Filled 2017-07-09: qty 20

## 2017-07-09 MED ORDER — MIDAZOLAM HCL 2 MG/2ML IJ SOLN
INTRAMUSCULAR | Status: AC
  Filled 2017-07-09: qty 2

## 2017-07-09 MED ORDER — PHENOL 1.4 % MT LIQD: 1.0000 | OROMUCOSAL | Status: DC | PRN

## 2017-07-09 MED ORDER — SENNOSIDES-DOCUSATE SODIUM 8.6-50 MG PO TABS
1.0000 | ORAL_TABLET | Freq: Every evening | ORAL | Status: DC | PRN
  Administered 2017-07-10: 1 via ORAL
  Filled 2017-07-09: qty 1

## 2017-07-09 MED ORDER — MORPHINE SULFATE (PF) 4 MG/ML IV SOLN: 1.0000 mg | INTRAVENOUS | Status: DC | PRN

## 2017-07-09 MED ORDER — MENTHOL 3 MG MT LOZG: 1.0000 | LOZENGE | OROMUCOSAL | Status: DC | PRN

## 2017-07-09 MED ORDER — ZOLPIDEM TARTRATE 5 MG PO TABS: 5.0000 mg | ORAL_TABLET | Freq: Every evening | ORAL | Status: DC | PRN

## 2017-07-09 MED ORDER — ACIDOPHILUS PO CAPS: 2.0000 | ORAL_CAPSULE | Freq: Every day | ORAL | Status: DC

## 2017-07-09 MED ORDER — BENEFIBER PO POWD: Freq: Every day | ORAL | Status: DC

## 2017-07-09 MED ORDER — SUGAMMADEX SODIUM 200 MG/2ML IV SOLN
INTRAVENOUS | Status: DC | PRN
  Administered 2017-07-09: 200 mg via INTRAVENOUS

## 2017-07-09 MED ORDER — THROMBIN (RECOMBINANT) 20000 UNITS EX SOLR
CUTANEOUS | Status: AC
  Filled 2017-07-09: qty 20000

## 2017-07-09 MED ORDER — TIMOLOL MALEATE 0.5 % OP SOLN
1.0000 [drp] | Freq: Every day | OPHTHALMIC | Status: DC
  Filled 2017-07-09: qty 5

## 2017-07-09 MED ORDER — ROCURONIUM BROMIDE 10 MG/ML (PF) SYRINGE
PREFILLED_SYRINGE | INTRAVENOUS | Status: AC
  Filled 2017-07-09: qty 5

## 2017-07-09 MED ORDER — PANTOPRAZOLE SODIUM 40 MG PO TBEC
40.0000 mg | DELAYED_RELEASE_TABLET | Freq: Every day | ORAL | Status: DC
  Administered 2017-07-10 – 2017-07-11 (×2): 40 mg via ORAL
  Filled 2017-07-09 (×2): qty 1

## 2017-07-09 MED ORDER — ONDANSETRON HCL 4 MG/2ML IJ SOLN
INTRAMUSCULAR | Status: AC
  Filled 2017-07-09: qty 2

## 2017-07-09 MED ORDER — METOCLOPRAMIDE HCL 5 MG/ML IJ SOLN: 10.0000 mg | Freq: Once | INTRAMUSCULAR | Status: DC | PRN

## 2017-07-09 MED ORDER — ONDANSETRON HCL 4 MG/2ML IJ SOLN
INTRAMUSCULAR | Status: DC | PRN
  Administered 2017-07-09: 4 mg via INTRAVENOUS

## 2017-07-09 MED ORDER — 0.9 % SODIUM CHLORIDE (POUR BTL) OPTIME
TOPICAL | Status: DC | PRN
  Administered 2017-07-09 (×5): 1000 mL

## 2017-07-09 MED ORDER — FENTANYL CITRATE (PF) 250 MCG/5ML IJ SOLN
INTRAMUSCULAR | Status: AC
  Filled 2017-07-09: qty 5

## 2017-07-09 MED ORDER — ATENOLOL 100 MG PO TABS
100.0000 mg | ORAL_TABLET | Freq: Every day | ORAL | Status: DC
  Administered 2017-07-09: 100 mg via ORAL
  Filled 2017-07-09 (×2): qty 1

## 2017-07-09 MED ORDER — LOSARTAN POTASSIUM 50 MG PO TABS
100.0000 mg | ORAL_TABLET | Freq: Every day | ORAL | Status: DC
  Administered 2017-07-10: 100 mg via ORAL
  Filled 2017-07-09: qty 2

## 2017-07-09 MED ORDER — DIAZEPAM 5 MG PO TABS
5.0000 mg | ORAL_TABLET | Freq: Four times a day (QID) | ORAL | Status: DC | PRN
  Administered 2017-07-09 – 2017-07-10 (×3): 5 mg via ORAL
  Filled 2017-07-09 (×3): qty 1

## 2017-07-09 MED ORDER — FLEET ENEMA 7-19 GM/118ML RE ENEM: 1.0000 | ENEMA | Freq: Once | RECTAL | Status: DC | PRN

## 2017-07-09 MED ORDER — TIMOLOL HEMIHYDRATE 0.5 % OP SOLN: 1.0000 [drp] | Freq: Every day | OPHTHALMIC | Status: DC

## 2017-07-09 MED ORDER — MEPERIDINE HCL 50 MG/ML IJ SOLN: 6.2500 mg | INTRAMUSCULAR | Status: DC | PRN

## 2017-07-09 MED ORDER — TRAZODONE HCL 100 MG PO TABS
200.0000 mg | ORAL_TABLET | Freq: Every day | ORAL | Status: DC
  Administered 2017-07-09 – 2017-07-10 (×2): 200 mg via ORAL
  Filled 2017-07-09 (×2): qty 2

## 2017-07-09 MED ORDER — LATANOPROST 0.005 % OP SOLN
1.0000 [drp] | Freq: Every day | OPHTHALMIC | Status: DC
  Administered 2017-07-09 – 2017-07-10 (×2): 1 [drp] via OPHTHALMIC
  Filled 2017-07-09: qty 2.5

## 2017-07-09 MED ORDER — SODIUM CHLORIDE 0.9% FLUSH: 3.0000 mL | INTRAVENOUS | Status: DC | PRN

## 2017-07-09 MED ORDER — SODIUM CHLORIDE 0.9% FLUSH
3.0000 mL | Freq: Two times a day (BID) | INTRAVENOUS | Status: DC
  Administered 2017-07-09 – 2017-07-10 (×2): 3 mL via INTRAVENOUS

## 2017-07-09 MED ORDER — HEMOSTATIC AGENTS (NO CHARGE) OPTIME
TOPICAL | Status: DC | PRN
  Administered 2017-07-09 (×2): 1 via TOPICAL

## 2017-07-09 MED ORDER — VANCOMYCIN HCL 10 G IV SOLR
1250.0000 mg | Freq: Once | INTRAVENOUS | Status: AC
  Administered 2017-07-09: 1250 mg via INTRAVENOUS
  Filled 2017-07-09: qty 1250

## 2017-07-09 MED ORDER — CYCLOSPORINE 0.05 % OP EMUL
1.0000 [drp] | Freq: Four times a day (QID) | OPHTHALMIC | Status: DC
  Administered 2017-07-09 – 2017-07-11 (×5): 1 [drp] via OPHTHALMIC
  Filled 2017-07-09 (×9): qty 1

## 2017-07-09 MED ORDER — BISACODYL 5 MG PO TBEC: 5.0000 mg | DELAYED_RELEASE_TABLET | Freq: Every day | ORAL | Status: DC | PRN

## 2017-07-09 MED ORDER — MELATONIN 3 MG PO TABS
9.0000 mg | ORAL_TABLET | Freq: Every day | ORAL | Status: DC
  Administered 2017-07-09 – 2017-07-10 (×2): 9 mg via ORAL
  Filled 2017-07-09 (×2): qty 3

## 2017-07-09 MED ORDER — FENTANYL CITRATE (PF) 250 MCG/5ML IJ SOLN
INTRAMUSCULAR | Status: DC | PRN
  Administered 2017-07-09: 100 ug via INTRAVENOUS
  Administered 2017-07-09 (×3): 50 ug via INTRAVENOUS

## 2017-07-09 MED ORDER — PROPOFOL 500 MG/50ML IV EMUL
INTRAVENOUS | Status: DC | PRN
  Administered 2017-07-09: 75 ug/kg/min via INTRAVENOUS

## 2017-07-09 MED ORDER — SODIUM CHLORIDE 0.9 % IV SOLN: 250.0000 mL | INTRAVENOUS | Status: DC

## 2017-07-09 MED ORDER — EPHEDRINE SULFATE-NACL 50-0.9 MG/10ML-% IV SOSY
PREFILLED_SYRINGE | INTRAVENOUS | Status: DC | PRN
  Administered 2017-07-09: 10 mg via INTRAVENOUS

## 2017-07-09 MED ORDER — BUPIVACAINE-EPINEPHRINE 0.25% -1:200000 IJ SOLN
INTRAMUSCULAR | Status: DC | PRN
  Administered 2017-07-09: 30 mL

## 2017-07-09 MED ORDER — METHYLENE BLUE 0.5 % INJ SOLN
INTRAVENOUS | Status: AC
  Filled 2017-07-09: qty 10

## 2017-07-09 MED ORDER — MAGNESIUM OXIDE 400 (241.3 MG) MG PO TABS
200.0000 mg | ORAL_TABLET | Freq: Two times a day (BID) | ORAL | Status: DC
  Administered 2017-07-09 – 2017-07-10 (×3): 200 mg via ORAL
  Filled 2017-07-09 (×3): qty 1

## 2017-07-09 MED ORDER — HYDROMORPHONE HCL 2 MG/ML IJ SOLN
0.3000 mg | INTRAMUSCULAR | Status: DC | PRN
  Administered 2017-07-09: 1 mg via INTRAVENOUS

## 2017-07-09 MED ORDER — BUPIVACAINE-EPINEPHRINE (PF) 0.25% -1:200000 IJ SOLN
INTRAMUSCULAR | Status: AC
  Filled 2017-07-09: qty 30

## 2017-07-09 MED ORDER — OXYCODONE-ACETAMINOPHEN 5-325 MG PO TABS
1.0000 | ORAL_TABLET | ORAL | Status: DC | PRN
  Administered 2017-07-09 – 2017-07-11 (×8): 2 via ORAL
  Filled 2017-07-09 (×7): qty 2

## 2017-07-09 MED ORDER — HYDROMORPHONE HCL 2 MG/ML IJ SOLN
INTRAMUSCULAR | Status: AC
  Filled 2017-07-09: qty 1

## 2017-07-09 MED ORDER — EPHEDRINE 5 MG/ML INJ
INTRAVENOUS | Status: AC
  Filled 2017-07-09: qty 10

## 2017-07-09 MED ORDER — GLYCOPYRROLATE 0.2 MG/ML IV SOSY
PREFILLED_SYRINGE | INTRAVENOUS | Status: DC | PRN
  Administered 2017-07-09: .2 mg via INTRAVENOUS

## 2017-07-09 MED ORDER — ALLOPURINOL 100 MG PO TABS
100.0000 mg | ORAL_TABLET | Freq: Every day | ORAL | Status: DC
  Administered 2017-07-10: 100 mg via ORAL
  Filled 2017-07-09 (×2): qty 1

## 2017-07-09 MED ORDER — ARTIFICIAL TEARS OPHTHALMIC OINT
TOPICAL_OINTMENT | OPHTHALMIC | Status: AC
  Filled 2017-07-09: qty 3.5

## 2017-07-09 MED ORDER — PROPOFOL 10 MG/ML IV BOLUS
INTRAVENOUS | Status: DC | PRN
  Administered 2017-07-09: 140 mg via INTRAVENOUS

## 2017-07-09 MED ORDER — ONDANSETRON HCL 4 MG PO TABS: 4.0000 mg | ORAL_TABLET | Freq: Four times a day (QID) | ORAL | Status: DC | PRN

## 2017-07-09 MED ORDER — OXYCODONE-ACETAMINOPHEN 5-325 MG PO TABS
ORAL_TABLET | ORAL | Status: AC
  Filled 2017-07-09: qty 2

## 2017-07-09 MED ORDER — THROMBIN (RECOMBINANT) 20000 UNITS EX SOLR
CUTANEOUS | Status: DC | PRN
  Administered 2017-07-09 (×2): 20000 [IU] via TOPICAL

## 2017-07-09 MED ORDER — AMLODIPINE BESYLATE 5 MG PO TABS: 10.0000 mg | ORAL_TABLET | Freq: Every day | ORAL | Status: DC

## 2017-07-09 MED ORDER — LOSARTAN POTASSIUM-HCTZ 100-25 MG PO TABS: 1.0000 | ORAL_TABLET | Freq: Every day | ORAL | Status: DC

## 2017-07-09 MED ORDER — CLONIDINE HCL 0.1 MG PO TABS
0.1000 mg | ORAL_TABLET | Freq: Every day | ORAL | Status: DC
  Administered 2017-07-09: 0.1 mg via ORAL
  Filled 2017-07-09: qty 1

## 2017-07-09 MED ORDER — PHENYLEPHRINE HCL 10 MG/ML IJ SOLN
INTRAVENOUS | Status: DC | PRN
  Administered 2017-07-09: 20 ug/min via INTRAVENOUS

## 2017-07-09 MED ORDER — BUPIVACAINE LIPOSOME 1.3 % IJ SUSP
20.0000 mL | INTRAMUSCULAR | Status: AC
  Administered 2017-07-09: 20 mL
  Filled 2017-07-09: qty 20

## 2017-07-09 MED ORDER — ACETAMINOPHEN 650 MG RE SUPP: 650.0000 mg | RECTAL | Status: DC | PRN

## 2017-07-09 MED ORDER — ALUM & MAG HYDROXIDE-SIMETH 200-200-20 MG/5ML PO SUSP: 30.0000 mL | Freq: Four times a day (QID) | ORAL | Status: DC | PRN

## 2017-07-09 MED ORDER — POTASSIUM CHLORIDE IN NACL 20-0.9 MEQ/L-% IV SOLN: INTRAVENOUS | Status: DC

## 2017-07-09 MED ORDER — POLYETHYLENE GLYCOL 3350 17 G PO PACK: 17.0000 g | PACK | Freq: Every day | ORAL | Status: DC | PRN

## 2017-07-09 MED ORDER — MIDAZOLAM HCL 5 MG/5ML IJ SOLN
INTRAMUSCULAR | Status: DC | PRN
  Administered 2017-07-09: 2 mg via INTRAVENOUS

## 2017-07-09 MED ORDER — LACTATED RINGERS IV SOLN
INTRAVENOUS | Status: DC
  Administered 2017-07-09 (×2): via INTRAVENOUS

## 2017-07-09 MED ORDER — ONDANSETRON HCL 4 MG/2ML IJ SOLN: 4.0000 mg | Freq: Four times a day (QID) | INTRAMUSCULAR | Status: DC | PRN

## 2017-07-09 MED ORDER — ACETAMINOPHEN 325 MG PO TABS: 650.0000 mg | ORAL_TABLET | ORAL | Status: DC | PRN

## 2017-07-09 MED ORDER — ARTIFICIAL TEARS OPHTHALMIC OINT
TOPICAL_OINTMENT | OPHTHALMIC | Status: DC | PRN
  Administered 2017-07-09: 1 via OPHTHALMIC

## 2017-07-09 MED ORDER — ROCURONIUM BROMIDE 10 MG/ML (PF) SYRINGE
PREFILLED_SYRINGE | INTRAVENOUS | Status: DC | PRN
  Administered 2017-07-09: 20 mg via INTRAVENOUS
  Administered 2017-07-09: 50 mg via INTRAVENOUS
  Administered 2017-07-09: 20 mg via INTRAVENOUS
  Administered 2017-07-09: 10 mg via INTRAVENOUS
  Administered 2017-07-09: 30 mg via INTRAVENOUS

## 2017-07-09 MED ORDER — SUCCINYLCHOLINE CHLORIDE 200 MG/10ML IV SOSY
PREFILLED_SYRINGE | INTRAVENOUS | Status: AC
  Filled 2017-07-09: qty 10

## 2017-07-09 MED ORDER — DOCUSATE SODIUM 100 MG PO CAPS
100.0000 mg | ORAL_CAPSULE | Freq: Two times a day (BID) | ORAL | Status: DC
  Administered 2017-07-09 – 2017-07-11 (×4): 100 mg via ORAL
  Filled 2017-07-09 (×4): qty 1

## 2017-07-09 MED ORDER — SUGAMMADEX SODIUM 200 MG/2ML IV SOLN
INTRAVENOUS | Status: AC
  Filled 2017-07-09: qty 2

## 2017-07-09 MED ORDER — LIDOCAINE 2% (20 MG/ML) 5 ML SYRINGE
INTRAMUSCULAR | Status: DC | PRN
  Administered 2017-07-09: 60 mg via INTRAVENOUS

## 2017-07-09 MED ORDER — SUCCINYLCHOLINE CHLORIDE 200 MG/10ML IV SOSY
PREFILLED_SYRINGE | INTRAVENOUS | Status: DC | PRN
  Administered 2017-07-09: 100 mg via INTRAVENOUS

## 2017-07-09 MED ORDER — SPIRONOLACTONE 25 MG PO TABS
25.0000 mg | ORAL_TABLET | Freq: Every day | ORAL | Status: DC
  Administered 2017-07-10: 25 mg via ORAL
  Filled 2017-07-09 (×2): qty 1

## 2017-07-09 MED ORDER — CORAL CALCIUM 1000 (390 CA) MG PO TABS
1000.0000 mg | ORAL_TABLET | Freq: Two times a day (BID) | ORAL | Status: DC
Start: 2017-07-09 — End: 2017-07-09

## 2017-07-09 SURGICAL SUPPLY — 97 items
BENZOIN TINCTURE PRP APPL 2/3 (GAUZE/BANDAGES/DRESSINGS) ×3 IMPLANT
BLADE CLIPPER SURG (BLADE) IMPLANT
BONE VIVIGEN FORMABLE 5.4CC (Bone Implant) ×3 IMPLANT
BUR PRESCISION 1.7 ELITE (BURR) ×3 IMPLANT
BUR ROUND FLUTED 5 RND (BURR) ×2 IMPLANT
BUR ROUND FLUTED 5MM RND (BURR) ×1
BUR ROUND PRECISION 4.0 (BURR) IMPLANT
BUR ROUND PRECISION 4.0MM (BURR)
BUR SABER RD CUTTING 3.0 (BURR) IMPLANT
BUR SABER RD CUTTING 3.0MM (BURR)
CARTRIDGE OIL MAESTRO DRILL (MISCELLANEOUS) ×1 IMPLANT
CLOSURE WOUND 1/2 X4 (GAUZE/BANDAGES/DRESSINGS) ×2
CONNECTOR Z ROD UNIVERSAL 5.5 (Connector) ×6 IMPLANT
CONT SPEC 4OZ CLIKSEAL STRL BL (MISCELLANEOUS) ×3 IMPLANT
COVER MAYO STAND STRL (DRAPES) ×6 IMPLANT
COVER SURGICAL LIGHT HANDLE (MISCELLANEOUS) ×3 IMPLANT
DIFFUSER DRILL AIR PNEUMATIC (MISCELLANEOUS) ×3 IMPLANT
DRAIN CHANNEL 15F RND FF W/TCR (WOUND CARE) IMPLANT
DRAPE C-ARM 42X72 X-RAY (DRAPES) ×3 IMPLANT
DRAPE C-ARMOR (DRAPES) IMPLANT
DRAPE POUCH INSTRU U-SHP 10X18 (DRAPES) ×3 IMPLANT
DRAPE SURG 17X23 STRL (DRAPES) ×12 IMPLANT
DURAPREP 26ML APPLICATOR (WOUND CARE) ×3 IMPLANT
ELECT BLADE 4.0 EZ CLEAN MEGAD (MISCELLANEOUS) ×3
ELECT CAUTERY BLADE 6.4 (BLADE) ×3 IMPLANT
ELECT REM PT RETURN 9FT ADLT (ELECTROSURGICAL) ×3
ELECTRODE BLDE 4.0 EZ CLN MEGD (MISCELLANEOUS) ×1 IMPLANT
ELECTRODE REM PT RTRN 9FT ADLT (ELECTROSURGICAL) ×1 IMPLANT
EVACUATOR SILICONE 100CC (DRAIN) IMPLANT
EXPEDIUM SCREW 9 X 55 ×2 IMPLANT
EXPEDIUM SCREW 9 X 60 ×2 IMPLANT
FEE INTRAOP MONITOR IMPULS NCS (MISCELLANEOUS) ×1 IMPLANT
GAUZE SPONGE 4X4 12PLY STRL (GAUZE/BANDAGES/DRESSINGS) ×3 IMPLANT
GAUZE SPONGE 4X4 12PLY STRL LF (GAUZE/BANDAGES/DRESSINGS) ×3 IMPLANT
GAUZE SPONGE 4X4 16PLY XRAY LF (GAUZE/BANDAGES/DRESSINGS) ×3 IMPLANT
GLOVE BIO SURGEON STRL SZ7 (GLOVE) ×3 IMPLANT
GLOVE BIO SURGEON STRL SZ8 (GLOVE) ×3 IMPLANT
GLOVE BIOGEL PI IND STRL 7.0 (GLOVE) ×1 IMPLANT
GLOVE BIOGEL PI IND STRL 8 (GLOVE) ×1 IMPLANT
GLOVE BIOGEL PI INDICATOR 7.0 (GLOVE) ×2
GLOVE BIOGEL PI INDICATOR 8 (GLOVE) ×2
GOWN STRL REUS W/ TWL LRG LVL3 (GOWN DISPOSABLE) ×2 IMPLANT
GOWN STRL REUS W/ TWL XL LVL3 (GOWN DISPOSABLE) ×1 IMPLANT
GOWN STRL REUS W/TWL LRG LVL3 (GOWN DISPOSABLE) ×4
GOWN STRL REUS W/TWL XL LVL3 (GOWN DISPOSABLE) ×2
INTRAOP MONITOR FEE IMPULS NCS (MISCELLANEOUS) ×1
INTRAOP MONITOR FEE IMPULSE (MISCELLANEOUS) ×2
IV CATH 14GX2 1/4 (CATHETERS) ×3 IMPLANT
KIT BASIN OR (CUSTOM PROCEDURE TRAY) ×3 IMPLANT
KIT INFUSE SMALL (Orthopedic Implant) ×3 IMPLANT
KIT POSITION SURG JACKSON T1 (MISCELLANEOUS) ×3 IMPLANT
KIT TURNOVER KIT B (KITS) ×3 IMPLANT
MARKER SKIN DUAL TIP RULER LAB (MISCELLANEOUS) ×3 IMPLANT
NDL SAFETY ECLIPSE 18X1.5 (NEEDLE) ×1 IMPLANT
NEEDLE 22X1 1/2 (OR ONLY) (NEEDLE) ×6 IMPLANT
NEEDLE HYPO 18GX1.5 SHARP (NEEDLE) ×2
NEEDLE HYPO 25GX1X1/2 BEV (NEEDLE) ×3 IMPLANT
NEEDLE SPNL 18GX3.5 QUINCKE PK (NEEDLE) ×6 IMPLANT
NS IRRIG 1000ML POUR BTL (IV SOLUTION) ×18 IMPLANT
OIL CARTRIDGE MAESTRO DRILL (MISCELLANEOUS) ×3
PACK LAMINECTOMY ORTHO (CUSTOM PROCEDURE TRAY) ×3 IMPLANT
PACK UNIVERSAL I (CUSTOM PROCEDURE TRAY) ×3 IMPLANT
PAD ARMBOARD 7.5X6 YLW CONV (MISCELLANEOUS) ×6 IMPLANT
PATTIES SURGICAL .5 X1 (DISPOSABLE) ×3 IMPLANT
PATTIES SURGICAL .5X1.5 (GAUZE/BANDAGES/DRESSINGS) ×3 IMPLANT
PROBE PEDCLE PROBE MAGSTM DISP (MISCELLANEOUS) ×3 IMPLANT
RASP HELIOCORDIAL MED (MISCELLANEOUS) ×3 IMPLANT
ROD SPINAL 5.5X110 PRELORDOSE (Rod) ×3 IMPLANT
SCREW EXP TI POLY 9X55 (Screw) IMPLANT
SCREW EXP TI POLY 9X60 (Screw) ×3 IMPLANT
SCREW EXPEDIUM POLYAXIAL 7X40M (Screw) ×12 IMPLANT
SCREW ILIAC EXPEDIUM 8X80MM (Screw) ×6 IMPLANT
SCREW POLY EXPENIUM 9X50MM (Screw) ×6 IMPLANT
SCREW SET SINGLE INNER (Screw) ×3 IMPLANT
SCREW VIPER 8X80 TI CORTI T27 (Screw) ×3 IMPLANT
SPONGE INTESTINAL PEANUT (DISPOSABLE) ×12 IMPLANT
SPONGE SURGIFOAM ABS GEL 100 (HEMOSTASIS) ×3 IMPLANT
STRIP CLOSURE SKIN 1/2X4 (GAUZE/BANDAGES/DRESSINGS) ×4 IMPLANT
STYLET INTUB SATIN SLIP 14FR (MISCELLANEOUS) ×3 IMPLANT
SURGIFLO W/THROMBIN 8M KIT (HEMOSTASIS) IMPLANT
SUT MNCRL AB 4-0 PS2 18 (SUTURE) ×3 IMPLANT
SUT VIC AB 0 CT1 18XCR BRD 8 (SUTURE) ×2 IMPLANT
SUT VIC AB 0 CT1 8-18 (SUTURE) ×4
SUT VIC AB 1 CT1 18XCR BRD 8 (SUTURE) ×2 IMPLANT
SUT VIC AB 1 CT1 8-18 (SUTURE) ×4
SUT VIC AB 2-0 CT2 18 VCP726D (SUTURE) ×6 IMPLANT
SYR 20CC LL (SYRINGE) ×6 IMPLANT
SYR BULB IRRIGATION 50ML (SYRINGE) ×3 IMPLANT
SYR CONTROL 10ML LL (SYRINGE) ×6 IMPLANT
SYR TB 1ML LUER SLIP (SYRINGE) ×3 IMPLANT
TAP CANN VIPER2 DL 6.0 (TAP) ×6 IMPLANT
TAP CANNULATED VIPER 8.0MM ×3 IMPLANT
TAPE CLOTH SURG 6X10 WHT LF (GAUZE/BANDAGES/DRESSINGS) ×3 IMPLANT
TRAY FOLEY MTR SLVR 16FR STAT (SET/KITS/TRAYS/PACK) ×3 IMPLANT
VIPER 8MM TAP ×3 IMPLANT
WATER STERILE IRR 1000ML POUR (IV SOLUTION) ×3 IMPLANT
YANKAUER SUCT BULB TIP NO VENT (SUCTIONS) ×3 IMPLANT

## 2017-07-09 NOTE — Progress Notes (Signed)
Orthopedic Tech Progress Note Patient Details:  Karen Castillo 09-25-1940 [id number] Patient already has brace. Patient ID: Karen Castillo, female   DOB: 1940-10-08, 77 y.o.   MRN: [id number]   [name] 07/09/2017, 6:11 PM

## 2017-07-09 NOTE — Progress Notes (Signed)
Pharmacy Antibiotic Note  Karen Castillo is a 77 y.o. female admitted on 07/09/2017 for lumbar surgery. Multiple allergies, including Augmentin and Cephalexin.    Vancomycin 1500 mg IV x 1 given pre-op at 0642.   Vancomycin to be given x 1 ~12 hrs post-op for surgical prophylaxis.  No drain.  Plan:  Vancomycin 1250 mg IV x 1 ~7pm tonight.  No follow up needed.  Pharmacy to sign off.  Height: 5\' 4"  (162.6 cm) Weight: 236 lb 6.4 oz (107.2 kg) IBW/kg (Calculated) : 54.7  Temp (24hrs), Avg:98 F (36.7 C), Min:97.5 F (36.4 C), Max:98.4 F (36.9 C)  4/25: serum creatinine 0.82  Estimated Creatinine Clearance: 70.8 mL/min (by C-G formula based on SCr of 0.82 mg/dL).    Allergies  Allergen Reactions  . Ciprofloxacin Swelling    tongue  . Pregabalin Anaphylaxis, Shortness Of Breath and Swelling    dizziness  . Mirapex [Pramipexole] Nausea And Vomiting  . Amitriptyline Other (See Comments)    Weird feeling all over  . Arthrotec [Diclofenac-Misoprostol] Diarrhea  . Augmentin [Amoxicillin-Pot Clavulanate] Diarrhea    Has patient had a PCN reaction causing immediate rash, facial/tongue/throat swelling, SOB or lightheadedness with hypotension: No Has patient had a PCN reaction causing severe rash involving mucus membranes or skin necrosis: No Has patient had a PCN reaction that required hospitalization: No Has patient had a PCN reaction occurring within the last 10 years: No If all of the above answers are "NO", then may proceed with Cephalosporin use.   . Bromfenac Diarrhea and Nausea Only  . Cephalexin Diarrhea and Nausea Only  . Conj Estrog-Medroxyprogest Ace Other (See Comments)    Unsure of reaction type Couldn't tolerate  . Crestor [Rosuvastatin] Other (See Comments)    Leg pain   . Diclofenac-Misoprostol Nausea Only  . Erythromycin Diarrhea and Nausea Only  . Esomeprazole Magnesium Nausea Only  . Flexeril [Cyclobenzaprine] Swelling    Feet   . Lescol [Fluvastatin] Other  (See Comments)    Leg pain  . Levofloxacin Other (See Comments) and Nausea Only    Stomach upset  . Metaxalone Rash    Flushing of the skin Sore throat  . Metoclopramide Diarrhea and Nausea Only  . Nexium [Esomeprazole] Diarrhea  . Pravastatin Other (See Comments)    Leg pain  . Propoxyphene Diarrhea and Nausea Only  . Rofecoxib Diarrhea and Nausea Only  . Statins     Leg pain   . Sulfur Diarrhea and Nausea Only  . Zoloft [Sertraline Hcl] Other (See Comments)    Sensitivity with teeth    Thank you for allowing pharmacy to be a part of this patient's care.  Arty Baumgartner, [place] Pager: [phone] 07/09/2017 5:01 PM

## 2017-07-09 NOTE — Op Note (Signed)
NAME: SAROYA, RICCOBONO MEDICAL RECORD [id number] ACCOUNT [id number] DATE OF BIRTH:05/20/1940 FACILITY: [facility] LOCATION: [facility] PHYSICIAN:Sloka Volante Velna Ochs, MD  OPERATIVE REPORT  DATE OF PROCEDURE:  07/09/2017  PREOPERATIVE DIAGNOSES: 1.  L5-S1 nonunion. 2.  Status post previous thoracolumbar fusion.  POSTOPERATIVE DIAGNOSES: 1.  L5-S1 nonunion.   2.  Status post previous thoracolumbar fusion.  PROCEDURE: 1.  Revision posterior spinal fusion, L5-S1. 2.  Removal and replacement of posterior instrumentation L4, L5, S1. 3.  Placement of posterior pelvic fixation using S2 screws, extending into the ilium bilaterally (required for stability). 4.  Use of local autograft. 5.  Use of morselized allograft--ViviGen. 6.  Intraoperative use of fluoroscopy.  SURGEON:  Phylliss Bob, MD  ASSISTANT:  Pricilla Holm, PA-C    ANESTHESIA:  General endotracheal anesthesia.  COMPLICATIONS:  None.  DISPOSITION:  Stable.  ESTIMATED BLOOD LOSS:  100 mL.  INDICATIONS FOR SURGERY:  Briefly, Ms. Pfahler is a pleasant 77 year old female who is status post a previous posterior spinal fusion in 2015.  This went on to a nonunion, and the patient did have an anterior lumbar fusion subsequent to that.  This also  resulted in a nonunion.  A CAT scan did clearly reveal a nonunion with significant lucency of the bilateral S1 screws.  Given her ongoing pain, we did discuss proceeding with the procedure noted above.  The patient did wish to proceed.  OPERATIVE DETAILS:  On 07/09/2017, the patient was brought to surgery, and general endotracheal anesthesia was administered.  The patient was placed prone on a well-padded flat Jackson bed with a spinal frame.  Antibiotics were given, and a timeout  procedure was performed.  The back was prepped and draped, and a midline incision was used.  The paraspinal musculature was dissected and undermined, and the bilateral posterior fixation was noted.  I did use a high-speed  bur to cut through the rod  between the L3 and L4 pedicle screws.  The caps at L4, L5, and S1 were removed bilaterally, as were the interconnecting rods.  The screws were then removed at L4, L5, and S1 bilaterally.  There was very obvious loosening of the bilateral S1 screws.  At  this point, I did subperiosteally decorticate the L5-S1 facet joint and posterolateral gutters on the right and left sides.  At this point, I did tap and upsize the screws at L4 and L5.  Screws 7 x 40 mm were placed.  There was substantial lucency of the  bilateral S1 screws, and 9 mm screws of the appropriate length were placed into the S1 pedicles bilaterally.  I did ensure purchase of the anterior cortex bilaterally. Given the substantial lucency noted in the sacral screws, I did elect to instrument the pelvis. Using AP and lateral fluoroscopy, I did cannulate the sacrum, and an awl was  then advanced across the sacroiliac joint and into the ilium bilaterally.  An 8 mm tap was used, and a 9 x 80 mm screw was placed bilaterally across the sacrum and into the ilium bilaterally.  At this point, after the facet joints and posterolateral  gutters were decorticated, I did place BMP into the facet joints and posterolateral gutters and over the top of the BMP.  I did place abundant ViviGen, as this was the patient's third attempt at fusing the L5-S1 level.  I then secured rods into the tulip  heads of the screws across L4, L5, S1, and across the S2/iliac screws.  Caps were then placed, and a final  locking procedure was performed.  I was very pleased with the final AP and lateral fluoroscopic images.  The wound was copiously irrigated  throughout the surgery with a total of approximately 3 L of normal saline.  I was very pleased with the final construct noted on the AP and lateral fluoroscopic images.  At this point, the wound was closed in layers using #1 Vicryl followed by 2-0 Vicryl  followed by 4-0 Monocryl.  Benzoin and Steri-Strips  were applied followed by sterile dressing.  All instrument counts were correct at the termination of the procedure.  Of note, Pricilla Holm was my assistant throughout surgery and did aid in retraction, suctioning and closure from start to finish.  Of note, I did use neurologic monitoring throughout the surgery, and there was no abnormal EMG activity noted throughout the entire surgery.  LN/NUANCE  D:07/09/2017 T:07/09/2017 JOB:[id number]

## 2017-07-09 NOTE — Transfer of Care (Signed)
Immediate Anesthesia Transfer of Care Note  Patient: Karen Castillo  Procedure(s) Performed: REVISION L5-S1 POSTERIOR SPINAL FUSION WITH INSTRUMENTATION AND ALLOGRAFT  TIME REQUESTED 4.5 HOURS (Left )  Patient Location: PACU  Anesthesia Type:General  Level of Consciousness: drowsy and patient cooperative  Airway & Oxygen Therapy: Patient Spontanous Breathing and Patient connected to face mask oxygen  Post-op Assessment: Report given to RN, Post -op Vital signs reviewed and stable and Patient moving all extremities X 4  Post vital signs: Reviewed and stable  Last Vitals:  Vitals Value Taken Time  BP 155/67 07/09/2017  2:42 PM  Temp    Pulse 58 07/09/2017  2:44 PM  Resp 11 07/09/2017  2:44 PM  SpO2 100 % 07/09/2017  2:44 PM  Vitals shown include unvalidated device data.  Last Pain:  Vitals:   07/09/17 0637  TempSrc:   PainSc: 5          Complications: No apparent anesthesia complications

## 2017-07-09 NOTE — H&P (Signed)
PREOPERATIVE H&P  Chief Complaint: Low back pain, left leg pain  HPI: Karen Castillo is a 77 y.o. female who presents with ongoing pain in the left leg and low back  CT scan reveals a nonunion at L5/S1 with a disc protrusion on the left at L5/S1  Patient has failed multiple forms of conservative care and continues to have pain (see office notes for additional details regarding the patient's full course of treatment)  Past Medical History:  Diagnosis Date  . Arthritis   . Bipolar disorder ([place])    patient denies  . Cancer ([facility])    SKIN    ,    LEFT BREAST   . GERD (gastroesophageal reflux disease)   . Hypertension   . Sleep apnea    USES CPAP    Past Surgical History:  Procedure Laterality Date  . ABDOMINAL EXPOSURE N/A 05/04/2014   Procedure: ABDOMINAL EXPOSURE;  Surgeon: Serafina Mitchell, MD;  Location: [place];  Service: Vascular;  Laterality: N/A;  . ANTERIOR LUMBAR FUSION N/A 05/04/2014   Procedure: ANTERIOR LUMBAR FUSION 1 LEVEL;  Surgeon: Sinclair Ship, MD;  Location: [place];  Service: Orthopedics;  Laterality: N/A;  Lumbar 5-sacrum 1 anterior lumbar interbody fusion with instrumentation and allograft  . BACK SURGERY     2010 ,2015LUMB FUSION, 06/2013 [facility] FUSION  . BREAST SURGERY     LEFT BRREAST BX  . CARPAL TUNNEL RELEASE     1994 RT  . CARPAL TUNNEL RELEASE Bilateral   . CHOLECYSTECTOMY     2004  . COLONOSCOPY W/ BIOPSIES    . ERCP     2009  . LEEP     [id number]  . SKIN CANCER EXCISION     X 4   . TUBAL LIGATION     Social History   Socioeconomic History  . Marital status: Widowed    Spouse name: Not on file  . Number of children: Not on file  . Years of education: Not on file  . Highest education level: Not on file  Occupational History  . Not on file  Social Needs  . Financial resource strain: Not on file  . Food insecurity:    Worry: Not on file    Inability: Not on file  . Transportation needs:    Medical: Not on file   Non-medical: Not on file  Tobacco Use  . Smoking status: Never Smoker  . Smokeless tobacco: Never Used  Substance and Sexual Activity  . Alcohol use: No  . Drug use: No  . Sexual activity: Not on file  Lifestyle  . Physical activity:    Days per week: Not on file    Minutes per session: Not on file  . Stress: Not on file  Relationships  . Social connections:    Talks on phone: Not on file    Gets together: Not on file    Attends religious service: Not on file    Active member of club or organization: Not on file    Attends meetings of clubs or organizations: Not on file    Relationship status: Not on file  Other Topics Concern  . Not on file  Social History Narrative  . Not on file   History reviewed. No pertinent family history. Allergies  Allergen Reactions  . Ciprofloxacin Swelling    tongue  . Pregabalin Anaphylaxis, Shortness Of Breath and Swelling    dizziness  . Mirapex [Pramipexole] Nausea And Vomiting  .  Amitriptyline Other (See Comments)    Weird feeling all over  . Arthrotec [Diclofenac-Misoprostol] Diarrhea  . Augmentin [Amoxicillin-Pot Clavulanate] Diarrhea    Has patient had a PCN reaction causing immediate rash, facial/tongue/throat swelling, SOB or lightheadedness with hypotension: No Has patient had a PCN reaction causing severe rash involving mucus membranes or skin necrosis: No Has patient had a PCN reaction that required hospitalization: No Has patient had a PCN reaction occurring within the last 10 years: No If all of the above answers are "NO", then may proceed with Cephalosporin use.   . Bromfenac Diarrhea and Nausea Only  . Cephalexin Diarrhea and Nausea Only  . Conj Estrog-Medroxyprogest Ace Other (See Comments)    Unsure of reaction type Couldn't tolerate  . Crestor [Rosuvastatin] Other (See Comments)    Leg pain   . Diclofenac-Misoprostol Nausea Only  . Erythromycin Diarrhea and Nausea Only  . Esomeprazole Magnesium Nausea Only  .  Flexeril [Cyclobenzaprine] Swelling    Feet   . Lescol [Fluvastatin] Other (See Comments)    Leg pain  . Levofloxacin Other (See Comments) and Nausea Only    Stomach upset  . Metaxalone Rash    Flushing of the skin Sore throat  . Metoclopramide Diarrhea and Nausea Only  . Nexium [Esomeprazole] Diarrhea  . Pravastatin Other (See Comments)    Leg pain  . Propoxyphene Diarrhea and Nausea Only  . Rofecoxib Diarrhea and Nausea Only  . Statins     Leg pain   . Sulfur Diarrhea and Nausea Only  . Zoloft [Sertraline Hcl] Other (See Comments)    Sensitivity with teeth   Prior to Admission medications   Medication Sig Start Date End Date Taking? Authorizing Provider  allopurinol (ZYLOPRIM) 100 MG tablet Take 100 mg by mouth daily.   Yes [provider]  amLODipine (NORVASC) 10 MG tablet Take 10 mg by mouth daily.   Yes [provider]  aspirin EC 81 MG tablet Take 81 mg by mouth daily.   Yes [provider]  atenolol (TENORMIN) 100 MG tablet Take 100 mg by mouth at bedtime.    Yes [provider]  Cholecalciferol (VITAMIN D) 2000 units CAPS Take 2,000 Units by mouth daily.   Yes [provider]  cloNIDine (CATAPRES) 0.1 MG tablet Take 0.1 mg by mouth at bedtime.   Yes [provider]  Coral Calcium 1000 (390 Ca) MG TABS Take 1,000 mg by mouth 2 (two) times daily.   Yes [provider]  cycloSPORINE (RESTASIS) 0.05 % ophthalmic emulsion Place 1 drop into both eyes 4 (four) times daily.   Yes [provider]  Glucosamine-Chondroitin (COSAMIN DS PO) Take 2 tablets by mouth daily.   Yes [provider]  Lactobacillus (ACIDOPHILUS PO) Take 1 capsule by mouth daily.   Yes [provider]  lansoprazole (PREVACID) 30 MG capsule Take 30 mg by mouth daily before breakfast. 30 minutes before breakfast   Yes [provider]  latanoprost (XALATAN) 0.005 % ophthalmic solution Place 1 drop into both eyes at  bedtime.   Yes [provider]  losartan-hydrochlorothiazide (HYZAAR) 100-25 MG tablet Take 1 tablet by mouth daily.   Yes [provider]  Magnesium 70 MG CAPS Take 70 mg by mouth 2 (two) times daily. CRAMP DEFENSE   Yes [provider]  Melatonin 10 MG TABS Take 10 mg by mouth at bedtime. (2100)   Yes [provider]  morphine (MS CONTIN) 30 MG 12 hr tablet Take 30  mg by mouth every 12 (twelve) hours.   Yes [provider]  nortriptyline (PAMELOR) 10 MG capsule Take 20 mg by mouth at bedtime.   Yes [provider]  Omega-3 Fatty Acids (FISH OIL) 1200 MG CAPS Take 1,200 mg by mouth 2 (two) times daily.   Yes [provider]  polyethylene glycol (MIRALAX / GLYCOLAX) packet Take 17 g by mouth daily as needed (for constipation.).    Yes [provider]  spironolactone (ALDACTONE) 25 MG tablet Take 25 mg by mouth daily.   Yes [provider]  timolol (BETIMOL) 0.5 % ophthalmic solution Place 1 drop into both eyes daily.   Yes [provider]  traZODone (DESYREL) 100 MG tablet Take 200 mg by mouth at bedtime. (2100)   Yes [provider]  Wheat Dextrin (BENEFIBER PO) Take 15 mLs by mouth daily.   Yes [provider]     All other systems have been reviewed and were otherwise negative with the exception of those mentioned in the HPI and as above.  Physical Exam: Vitals:   07/09/17 0548  BP: (!) 165/47  Pulse: 66  Resp: 20  Temp: 98.4 F (36.9 C)  SpO2: 97%    There is no height or weight on file to calculate BMI.  General: Alert, no acute distress Cardiovascular: No pedal edema Respiratory: No cyanosis, no use of accessory musculature Skin: No lesions in the area of chief complaint Neurologic: Sensation intact distally Psychiatric: Patient is competent for consent with normal mood and affect Lymphatic: No axillary or cervical lymphadenopathy   Assessment/Plan: L5/S1  NONUNION Plan for Procedure(s): REVISION L5-S1 POSTERIOR SPINAL FUSION WITH INSTRUMENTATION AND ALLOGRAFT   Sinclair Ship, MD 07/09/2017 6:30 AM

## 2017-07-09 NOTE — Anesthesia Procedure Notes (Signed)
Procedure Name: Intubation Date/Time: 07/09/2017 7:39 AM Performed by: Freddie Breech, CRNA Pre-anesthesia Checklist: Patient identified, Emergency Drugs available, Suction available and Patient being monitored Patient Re-evaluated:Patient Re-evaluated prior to induction Oxygen Delivery Method: Circle System Utilized Preoxygenation: Pre-oxygenation with 100% oxygen Induction Type: IV induction Ventilation: Mask ventilation without difficulty Laryngoscope Size: Mac and 4 Grade View: Grade II Tube type: Oral Tube size: 7.0 mm Number of attempts: 1 Airway Equipment and Method: Stylet and Oral airway Placement Confirmation: ETT inserted through vocal cords under direct vision,  positive ETCO2 and breath sounds checked- equal and bilateral Secured at: 21 cm Tube secured with: Tape Dental Injury: Teeth and Oropharynx as per pre-operative assessment

## 2017-07-09 NOTE — Anesthesia Postprocedure Evaluation (Signed)
Anesthesia Post Note  Patient: DELSY ETZKORN  Procedure(s) Performed: REVISION L5-S1 POSTERIOR SPINAL FUSION WITH INSTRUMENTATION AND ALLOGRAFT  TIME REQUESTED 4.5 HOURS (Left )     Patient location during evaluation: PACU Anesthesia Type: General Level of consciousness: awake and alert and oriented Pain management: pain level controlled Vital Signs Assessment: post-procedure vital signs reviewed and stable Respiratory status: spontaneous breathing, nonlabored ventilation, respiratory function stable and patient connected to nasal cannula oxygen Cardiovascular status: blood pressure returned to baseline and stable Postop Assessment: no apparent nausea or vomiting Anesthetic complications: no    Last Vitals:  Vitals:   07/09/17 1445 07/09/17 1515  BP:  (!) 122/46  Pulse:  (!) 58  Resp:  10  Temp: (!) 36.4 C   SpO2:  92%    Last Pain:  Vitals:   07/09/17 1515  TempSrc:   PainSc: Asleep                 Kennett Symes A.

## 2017-07-09 NOTE — Progress Notes (Signed)
Arrived pacu with diffusely bruised/reddend forearms ( L&R) with r worse than l

## 2017-07-10 MED FILL — Sodium Chloride IV Soln 0.9%: INTRAVENOUS | Qty: 1000 | Status: AC

## 2017-07-10 MED FILL — Heparin Sodium (Porcine) Inj 1000 Unit/ML: INTRAMUSCULAR | Qty: 30 | Status: AC

## 2017-07-10 NOTE — Evaluation (Signed)
Physical Therapy Evaluation Patient Details Name: Karen Castillo MRN: [id number] DOB: 06-28-1940 Today's Date: 07/10/2017   History of Present Illness  Pt is a 77 y/o female who presents s/p L5-S1 revision of posterior spinal fusion on 07/09/17. PMH significant for HTN, CA, bipolar disorder, B carpal tunnel release.  Clinical Impression  Pt admitted with above diagnosis. Pt currently with functional limitations due to the deficits listed below (see PT Problem List). At the time of PT eval pt was very limited by pain. We were not able to initiate stair training due to decreased tolerance for functional activity. Will plan to follow up tomorrow however if pt is discharging this afternoon, please page PT as pt will need to demonstrate stair negotiation to ensure safe entry into home prior to d/c. Pt will benefit from skilled PT to increase their independence and safety with mobility to allow discharge to the venue listed below.       Follow Up Recommendations Home health PT;Supervision/Assistance - 24 hour    Equipment Recommendations  None recommended by PT    Recommendations for Other Services       Precautions / Restrictions Precautions Precautions: Fall;Back Precaution Booklet Issued: Yes (comment) Precaution Comments: Pt was educated on precautions throughout functional mobility. Handout provided at end of session.  Required Braces or Orthoses: Spinal Brace Spinal Brace: Thoracolumbosacral orthotic;Applied in sitting position Restrictions Weight Bearing Restrictions: No      Mobility  Bed Mobility Overal bed mobility: Needs Assistance Bed Mobility: Rolling;Sidelying to Sit Rolling: Min guard Sidelying to sit: Min assist     Sit to sidelying: Mod assist General bed mobility comments: Assist to elevate trunk to full sitting position.   Transfers Overall transfer level: Needs assistance Equipment used: Rolling walker (2 wheeled) Transfers: Sit to/from Stand Sit to Stand: Min  guard         General transfer comment: slow and painful  Ambulation/Gait Ambulation/Gait assistance: Min guard Ambulation Distance (Feet): 75 Feet Assistive device: Rolling walker (2 wheeled) Gait Pattern/deviations: Step-through pattern;Decreased stride length;Trunk flexed Gait velocity: Decreased Gait velocity interpretation: <1.8 ft/sec, indicate of risk for recurrent falls General Gait Details: Slow and guarded due to pain. Pt required close, hands-on guarding for safety and frequent cues for improved posture and general sequencing/safety with the RW.   Stairs            Wheelchair Mobility    Modified Rankin (Stroke Patients Only)       Balance Overall balance assessment: Needs assistance Sitting-balance support: Feet supported Sitting balance-Leahy Scale: Good     Standing balance support: Bilateral upper extremity supported;During functional activity Standing balance-Leahy Scale: Poor Standing balance comment: Required hands-on support for static standing balance.                              Pertinent Vitals/Pain Pain Assessment: 0-10 Pain Score: 8  Pain Location: back Pain Descriptors / Indicators: Operative site guarding;Discomfort Pain Intervention(s): Limited activity within patient's tolerance;Monitored during session    [place] expects to be discharged to:: Private residence Living Arrangements: Children Available Help at Discharge: Family;Available 24 hours/day("working it out". Plan not in place yet) Type of Home: House Home Access: Stairs to enter Entrance Stairs-Rails: None Entrance Stairs-Number of Steps: 1 Home Layout: Two level;Able to live on main level with bedroom/bathroom Home Equipment: Gilford Rile - 2 wheels;Cane - quad;Bedside commode;Shower seat;Grab bars - tub/shower;Hand held shower head;Adaptive equipment  Prior Function Level of Independence: Independent with assistive device(s)          Comments: Has been using the walker the past 5-6 weeks due to pain(increased difficulty with ADL PTA; using dressing stick)     Hand Dominance   Dominant Hand: Right    Extremity/Trunk Assessment   Upper Extremity Assessment Upper Extremity Assessment: Defer to OT evaluation    Lower Extremity Assessment Lower Extremity Assessment: Generalized weakness    Cervical / Trunk Assessment Cervical / Trunk Assessment: Other exceptions Cervical / Trunk Exceptions: Forward head and rounded shoulder posture. Generally bent in standing  Communication   Communication: No difficulties  Cognition Arousal/Alertness: Awake/alert Behavior During Therapy: WFL for tasks assessed/performed Overall Cognitive Status: Within Functional Limits for tasks assessed                                        General Comments      Exercises     Assessment/Plan    PT Assessment Patient needs continued PT services  PT Problem List Decreased strength;Decreased range of motion;Decreased activity tolerance;Decreased balance;Decreased mobility;Decreased knowledge of use of DME;Decreased safety awareness;Decreased knowledge of precautions;Pain       PT Treatment Interventions DME instruction;Gait training;Stair training;Functional mobility training;Therapeutic activities;Therapeutic exercise;Neuromuscular re-education;Patient/family education    PT Goals (Current goals can be found in the Care Plan section)  Acute Rehab PT Goals Patient Stated Goal: to be more functional without pain PT Goal Formulation: With patient Time For Goal Achievement: 07/17/17 Potential to Achieve Goals: Good    Frequency Min 5X/week   Barriers to discharge        Co-evaluation               AM-PAC PT "6 Clicks" Daily Activity  Outcome Measure Difficulty turning over in bed (including adjusting bedclothes, sheets and blankets)?: Unable Difficulty moving from lying on back to sitting on the side  of the bed? : Unable Difficulty sitting down on and standing up from a chair with arms (e.g., wheelchair, bedside commode, etc,.)?: Unable Help needed moving to and from a bed to chair (including a wheelchair)?: A Little Help needed walking in hospital room?: A Little Help needed climbing 3-5 steps with a railing? : Total 6 Click Score: 10    End of Session Equipment Utilized During Treatment: Gait belt;Back brace Activity Tolerance: Patient limited by pain Patient left: in bed;with call bell/phone within reach;with family/visitor present(OT present) Nurse Communication: Mobility status PT Visit Diagnosis: Unsteadiness on feet (R26.81);Pain;Other symptoms and signs involving the nervous system (R29.898) Pain - part of body: (back)    Time: 2778-2423 PT Time Calculation (min) (ACUTE ONLY): 18 min   Charges:   PT Evaluation $PT Eval Moderate Complexity: 1 Mod     PT G Codes:        Rolinda Roan, PT, DPT Acute Rehabilitation Services Pager: [place] 07/10/2017, 1:30 PM

## 2017-07-10 NOTE — Plan of Care (Signed)
  Problem: Bowel/Gastric: Goal: Gastrointestinal status for postoperative course will improve Outcome: Progressing   Problem: Education: Goal: Ability to verbalize activity precautions or restrictions will improve Outcome: Progressing Goal: Knowledge of the prescribed therapeutic regimen will improve Outcome: Progressing Goal: Understanding of discharge needs will improve Outcome: Progressing   Problem: Health Behavior/Discharge Planning: Goal: Identification of resources available to assist in meeting health care needs will improve Outcome: Progressing

## 2017-07-10 NOTE — Progress Notes (Signed)
Occupational Therapy Evaluation Patient Details Name: Karen Castillo MRN: [id number] DOB: [birth year] Today's Date: 07/10/2017    History of Present Illness Pt is a 77 y/o female who presents s/p L5-S1 revision of posterior spinal fusion on 07/09/17. PMH significant for HTN, CA, bipolar disorder, B carpal tunnel release.   Clinical Impression   PTA, pt modified independent with ADL and mobility with use of DME and AE. Began education regarding compensatory techniques and back precautions for ADL. Educated pt/family on donning/doffing back brace. Pt in pain but mobilizing safely for DC home when medically appropriate.Family will be able to assist as needed at DC. If pt DC today, please page OT to complete second visit prior to DC to review ADL education as pt will not have OT follow up.     Follow Up Recommendations  No OT follow up;Supervision - Intermittent    Equipment Recommendations  None recommended by OT    Recommendations for Other Services       Precautions / Restrictions Precautions Precautions: Fall;Back Precaution Booklet Issued: Yes (comment) Precaution Comments: Required Braces or Orthoses: Spinal Brace Spinal Brace: Thoracolumbosacral orthotic;Applied in sitting position Restrictions Weight Bearing Restrictions: No      Mobility Bed Mobility Overal bed mobility: Needs Assistance Bed Mobility: Sit to Sidelying         Sit to sidelying: Mod assist General bed mobility comments: to lift legs  Transfers Overall transfer level: Needs assistance   Transfers: Sit to/from Stand Sit to Stand: Min guard         General transfer comment: slow and painful    Balance Overall balance assessment: Needs assistance   Sitting balance-Leahy Scale: Good       Standing balance-Leahy Scale: Fair                             ADL either performed or assessed with clinical judgement   ADL Overall ADL's : Needs assistance/impaired     Grooming: Set  up;Sitting   Upper Body Bathing: Set up;Sitting   Lower Body Bathing: Moderate assistance;Sit to/from stand   Upper Body Dressing : Minimal assistance;Sitting   Lower Body Dressing: Moderate assistance;Sit to/from stand   Toilet Transfer: Minimal assistance;RW;Ambulation;Comfort height toilet   Toileting- Clothing Manipulation and Hygiene: Moderate assistance;Sit to/from stand       Functional mobility during ADLs: Minimal assistance;Rolling walker General ADL Comments: unable to cross feet over knees; has used AE in the past; asked to review use of AE; uses toilet aid at times; educated son-in-law how to donn/doff and adjust brace appropriately     Vision         Perception     Praxis      Pertinent Vitals/Pain Pain Assessment: 0-10 Pain Score: 8  Pain Location: back Pain Descriptors / Indicators: Operative site guarding;Discomfort Pain Intervention(s): Limited activity within patient's tolerance;Repositioned     Hand Dominance Right   Extremity/Trunk Assessment Upper Extremity Assessment Upper Extremity Assessment: Generalized weakness   Lower Extremity Assessment Lower Extremity Assessment: Defer to PT evaluation   Cervical / Trunk Assessment Cervical / Trunk Assessment: Other exceptions Cervical / Trunk Exceptions: Forward head and rounded shoulder posture. Generally bent in standing   Communication Communication Communication: No difficulties   Cognition Arousal/Alertness: Awake/alert Behavior During Therapy: WFL for tasks assessed/performed Overall Cognitive Status: Within Functional Limits for tasks assessed  General Comments       Exercises     Shoulder Instructions      Home Living Family/patient expects to be discharged to:: Private residence Living Arrangements: Children Available Help at Discharge: Family;Available 24 hours/day("working it out". Plan not in place yet) Type of Home:  House Home Access: Stairs to enter [employer] of Steps: 1 Entrance Stairs-Rails: None Home Layout: Two level;Able to live on main level with bedroom/bathroom     Bathroom Shower/Tub: Occupational psychologist: Handicapped height Bathroom Accessibility: Yes How Accessible: Accessible via walker Home Equipment: [place] - 2 wheels;Cane - quad;Bedside commode;Shower seat;Grab bars - tub/shower;Hand held shower head;Adaptive equipment Adaptive Equipment: Reacher;Sock aid;Long-handled shoe horn;Long-handled sponge        Prior Functioning/Environment Level of Independence: Independent with assistive device(s)        Comments: Has been using the walker the past 5-6 weeks due to pain(increased difficulty with ADL PTA; using dressing stick)        OT Problem List: Decreased strength;Decreased activity tolerance;Impaired balance (sitting and/or standing);Decreased knowledge of use of DME or AE;Decreased knowledge of precautions;Obesity;Pain      OT Treatment/Interventions: Self-care/ADL training;DME and/or AE instruction;Therapeutic activities;Patient/family education    OT Goals(Current goals can be found in the care plan section) Acute Rehab OT Goals Patient Stated Goal: to be more functional without pain OT Goal Formulation: With patient Time For Goal Achievement: 07/24/17 Potential to Achieve Goals: Good  OT Frequency: Min 2X/week   Barriers to D/C:            Co-evaluation              AM-PAC PT "6 Clicks" Daily Activity     Outcome Measure Help from another person eating meals?: None Help from another person taking care of personal grooming?: A Little Help from another person toileting, which includes using toliet, bedpan, or urinal?: A Little Help from another person bathing (including washing, rinsing, drying)?: A Little Help from another person to put on and taking off regular upper body clothing?: A Little Help from another person to put on  and taking off regular lower body clothing?: A Little 6 Click Score: 19   End of Session Equipment Utilized During Treatment: Rolling walker;Back brace Nurse Communication: Mobility status;Other (comment)(page if DC home today)  Activity Tolerance: Patient tolerated treatment well Patient left: in bed;with call bell/phone within reach;with family/visitor present  OT Visit Diagnosis: Unsteadiness on feet (R26.81);Muscle weakness (generalized) (M62.81);Pain Pain - part of body: (back)                Time: 5784-6962 OT Time Calculation (min): 23 min Charges:  OT General Charges $OT Visit: 1 Visit OT Evaluation $OT Eval Moderate Complexity: 1 Mod OT Treatments $Self Care/Home Management : 8-22 mins G-Codes:     [facility], OT/L  [phone] 07/10/2017  Xoey Warmoth,HILLARY 07/10/2017, 9:49 AM

## 2017-07-10 NOTE — Progress Notes (Signed)
    Patient doing well  Has been tolerating PO   Physical Exam: Vitals:   07/10/17 0337 07/10/17 0500  BP: (!) 117/41   Pulse: 93 87  Resp: 18   Temp: (!) 102.2 F (39 C) (!) 101.7 F (38.7 C)  SpO2: 90% 97%    Dressing in place NVI  POD #1 s/p revision L5/S1 fusion procedure with extension of instrumentation to the pelvis  - Of note the pt did inform me that she would be out of her 30mg  long acting ocycontin prescribed through her px mngmt specialist in 1 day. She was planning on changing px mngmt practices and asked if I would refill this. I informed her I was not comfortable refilling this type of medication and that now was not an ideal time to change px mngmt offices. I informed her she needed to contact her px mngmt office for a refill and she voiced understanding   - up with PT/OT, encourage ambulation - Percocet for pain, Valium for muscle spasms - likely d/c home today with f/u in 2 weeks

## 2017-07-10 NOTE — Care Management Note (Signed)
Case Management Note  Patient Details  Name: Karen Castillo MRN: [id number] Date of Birth: [birth year]  Subjective/Objective:   77 yr old female s/p L5-S1 revision of posterior spinal fusion.                Action/Plan: Case manager spoke with patient concerning discharge plan. Choice for [place] was offered, referral was called to Neoma Laming, [place] Liaison.Patient will have family support at discharge.     Expected Discharge Date:  07/10/17               Expected Discharge Plan:  [place]  In-House Referral:  NA  Discharge planning Services  CM Consult  Post Acute Care Choice:  Home Health Choice offered to:  Patient  DME Arranged:  N/A DME Agency:  NA  HH Arranged:  PT [place] Agency:  [place]  Status of Service:  Completed, signed off  If discussed at [place] of Stay Meetings, dates discussed:    Additional Comments:  Ninfa Meeker, RN 07/10/2017, 1:11 PM

## 2017-07-11 LAB — CBC
HEMATOCRIT: 30.1 % — AB (ref 36.0–46.0)
HEMOGLOBIN: 9.8 g/dL — AB (ref 12.0–15.0)
MCH: 29.3 pg (ref 26.0–34.0)
MCHC: 32.6 g/dL (ref 30.0–36.0)
MCV: 89.9 fL (ref 78.0–100.0)
Platelets: 229 10*3/uL (ref 150–400)
RBC: 3.35 MIL/uL — ABNORMAL LOW (ref 3.87–5.11)
RDW: 13.1 % (ref 11.5–15.5)
WBC: 17.5 10*3/uL — ABNORMAL HIGH (ref 4.0–10.5)

## 2017-07-11 LAB — GLUCOSE, CAPILLARY: Glucose-Capillary: 142 mg/dL — ABNORMAL HIGH (ref 65–99)

## 2017-07-11 NOTE — Progress Notes (Signed)
    Patient doing well PO Day 2 with improved pain and function. Her son in law was able to acquire her oxycontin Rx yesterday from her pain mngmt specialist.    Physical Exam: Vitals:   07/11/17 0633 07/11/17 0728  BP: (!) 109/42 (!) 101/47  Pulse: 77 76  Resp:  18  Temp:  98.9 F (37.2 C)  SpO2:  95%    Dressing in place, CDI, pt resting comfortably in bed NVI  POD #2 s/p L5-S1 fusion procedure with extension of instrumentation to her pelvis  - up with PT/OT, encourage ambulation - Percocet for pain, Valium for muscle spasms - Pt has oxycontin from px mngmt  - likely d/c home today with f/u in 2 weeks

## 2017-07-11 NOTE — Progress Notes (Signed)
Physical Therapy Treatment Patient Details Name: Karen Castillo MRN: [id number] DOB: 1940/07/25 Today's Date: 07/11/2017    History of Present Illness Pt is a 77 y/o female who presents s/p L5-S1 revision of posterior spinal fusion on 07/09/17. PMH significant for HTN, CA, bipolar disorder, B carpal tunnel release.    PT Comments    Patient not progressing with mobility, limited by pain and reluctance to walk. Educated patient on importance of mobility for pain management and functional improvements. Patient does not appear extremely receptive at this time. Continue to feel patient will need post acute rehab HHPT and physical assist from family.     Follow Up Recommendations  Home health PT;Supervision/Assistance - 24 hour     Equipment Recommendations  None recommended by PT    Recommendations for Other Services       Precautions / Restrictions Precautions Precautions: Fall;Back Precaution Booklet Issued: Yes (comment) Precaution Comments: Pt was educated on precautions throughout functional mobility. Handout provided at end of session.  Required Braces or Orthoses: Spinal Brace Spinal Brace: Thoracolumbosacral orthotic;Applied in sitting position Restrictions Weight Bearing Restrictions: No    Mobility  Bed Mobility Overal bed mobility: Needs Assistance Bed Mobility: Rolling;Sidelying to Sit Rolling: Min guard Sidelying to sit: Min assist       General bed mobility comments: Assist to elevate trunk to full sitting position.   Transfers Overall transfer level: Needs assistance Equipment used: Rolling walker (2 wheeled) Transfers: Sit to/from Stand Sit to Stand: Min assist         General transfer comment: slow and painful, min assist to power up to standing  Ambulation/Gait Ambulation/Gait assistance: Min assist Ambulation Distance (Feet): 50 Feet Assistive device: Rolling walker (2 wheeled) Gait Pattern/deviations: Step-through pattern;Decreased stride  length;Trunk flexed Gait velocity: Decreased Gait velocity interpretation: <1.31 ft/sec, indicative of household ambulator General Gait Details: Slow and guarded due to pain. Pt required close, hands-on guarding for safety and frequent cues for improved posture and general sequencing/safety with the RW.    Stairs             Wheelchair Mobility    Modified Rankin (Stroke Patients Only)       Balance Overall balance assessment: Needs assistance Sitting-balance support: Feet supported Sitting balance-Leahy Scale: Good     Standing balance support: Bilateral upper extremity supported;During functional activity Standing balance-Leahy Scale: Poor Standing balance comment: Required hands-on support for static standing balance.                             Cognition Arousal/Alertness: Awake/alert Behavior During Therapy: Anxious Overall Cognitive Status: Within Functional Limits for tasks assessed                                        Exercises      General Comments        Pertinent Vitals/Pain Pain Assessment: 0-10 Pain Score: 8  Pain Location: back Pain Descriptors / Indicators: Operative site guarding Pain Intervention(s): Limited activity within patient's tolerance;Monitored during session    Home Living                      Prior Function            PT Goals (current goals can now be found in the care plan section) Acute Rehab PT Goals Patient Stated  Goal: to be more functional without pain PT Goal Formulation: With patient Time For Goal Achievement: 07/17/17 Potential to Achieve Goals: Good Progress towards PT goals: Not progressing toward goals - comment    Frequency    Min 5X/week      PT Plan Current plan remains appropriate    Co-evaluation              AM-PAC PT "6 Clicks" Daily Activity  Outcome Measure  Difficulty turning over in bed (including adjusting bedclothes, sheets and blankets)?:  Unable Difficulty moving from lying on back to sitting on the side of the bed? : Unable Difficulty sitting down on and standing up from a chair with arms (e.g., wheelchair, bedside commode, etc,.)?: Unable Help needed moving to and from a bed to chair (including a wheelchair)?: A Little Help needed walking in hospital room?: A Little Help needed climbing 3-5 steps with a railing? : Total 6 Click Score: 10    End of Session Equipment Utilized During Treatment: Gait belt;Back brace Activity Tolerance: Patient limited by pain Patient left: in chair;with call bell/phone within reach Nurse Communication: Mobility status PT Visit Diagnosis: Unsteadiness on feet (R26.81);Pain;Other symptoms and signs involving the nervous system (R29.898) Pain - part of body: (back)     Time: 2585-2778 PT Time Calculation (min) (ACUTE ONLY): 22 min  Charges:  $Therapeutic Activity: 8-22 mins                    G Codes:       Alben Deeds, PT DPT  Board Certified Neurologic Specialist [place] 07/11/2017, 8:43 AM

## 2017-07-11 NOTE — Progress Notes (Signed)
Occupational Therapy Treatment Patient Details Name: Karen Castillo MRN: [id number] DOB: 07/20/1940 Today's Date: 07/11/2017    History of present illness Pt is a 77 y/o female who presents s/p L5-S1 revision of posterior spinal fusion on 07/09/17. PMH significant for HTN, CA, bipolar disorder, B carpal tunnel release.   OT comments  Pt presented supine, easily awakened and willing to work with OT. Focus was dressing (mod A), donning brace (mod A), and compensatory strategies to maintain back precautions during ADL and bed mobility. Pt will require continued OT in the home health setting post-acute to maximize safety, back precautions, and independence in ADL. Pt familiar with AE (has from previous sx).  Follow Up Recommendations  Home health OT;Supervision - Intermittent    Equipment Recommendations  None recommended by OT    Recommendations for Other Services      Precautions / Restrictions Precautions Precautions: Fall;Back Precaution Booklet Issued: Yes (comment) Precaution Comments: Pt was educated on precautions throughout functional mobility. Handout provided at end of session.  Required Braces or Orthoses: Spinal Brace Spinal Brace: Thoracolumbosacral orthotic;Applied in sitting position Restrictions Weight Bearing Restrictions: No       Mobility Bed Mobility Overal bed mobility: Needs Assistance Bed Mobility: Rolling;Sidelying to Sit;Sit to Sidelying Rolling: Min guard Sidelying to sit: Min assist     Sit to sidelying: Mod assist General bed mobility comments: Assist to elevate trunk to full sitting position, asssit for BLE to get back in bed  Transfers Overall transfer level: Needs assistance Equipment used: Rolling walker (2 wheeled) Transfers: Sit to/from Stand Sit to Stand: Mod assist         General transfer comment: slow and painful, min assist to power up to standing    Balance Overall balance assessment: Needs assistance Sitting-balance support:  Feet supported Sitting balance-Leahy Scale: Good     Standing balance support: Bilateral upper extremity supported;During functional activity Standing balance-Leahy Scale: Poor Standing balance comment: Required hands-on support for static standing balance.                            ADL either performed or assessed with clinical judgement   ADL Overall ADL's : Needs assistance/impaired                 Upper Body Dressing : Sitting;Moderate assistance;Cueing for sequencing Upper Body Dressing Details (indicate cue type and reason): mod A to don shirt and brace- Pt able to verbally instruct how to don brace to OT - forgot about straps that go underarms and hook to chest support Lower Body Dressing: Moderate assistance;Sit to/from stand Lower Body Dressing Details (indicate cue type and reason): assist to thread legs through appropriate holes, mod A for boost from bed, and assist to pull up pants                     Vision       Perception     Praxis      Cognition Arousal/Alertness: Awake/alert Behavior During Therapy: Anxious Overall Cognitive Status: Within Functional Limits for tasks assessed                                          Exercises     Shoulder Instructions       General Comments Pt's son-in-law present. Asking about dressing change over incision,  how to perform steps. OT deferred education to RN and PT.    Pertinent Vitals/ Pain       Pain Assessment: 0-10 Pain Score: 6  Pain Location: back Pain Descriptors / Indicators: Operative site guarding Pain Intervention(s): Limited activity within patient's tolerance;Repositioned  Home Living                                          Prior Functioning/Environment              Frequency  Min 2X/week        Progress Toward Goals  OT Goals(current goals can now be found in the care plan section)  Progress towards OT goals: Progressing  toward goals  Acute Rehab OT Goals Patient Stated Goal: to be more functional without pain OT Goal Formulation: With patient Time For Goal Achievement: 07/24/17 Potential to Achieve Goals: Good  Plan Frequency remains appropriate;Discharge plan needs to be updated    Co-evaluation                 AM-PAC PT "6 Clicks" Daily Activity     Outcome Measure   Help from another person eating meals?: None Help from another person taking care of personal grooming?: A Little Help from another person toileting, which includes using toliet, bedpan, or urinal?: A Little Help from another person bathing (including washing, rinsing, drying)?: A Little Help from another person to put on and taking off regular upper body clothing?: A Little Help from another person to put on and taking off regular lower body clothing?: A Little 6 Click Score: 19    End of Session Equipment Utilized During Treatment: Rolling walker;Back brace  OT Visit Diagnosis: Unsteadiness on feet (R26.81);Muscle weakness (generalized) (M62.81);Pain Pain - part of body: (back)   Activity Tolerance Patient limited by pain   Patient Left in bed;with call bell/phone within reach;with family/visitor present   Nurse Communication Mobility status        Time: 1129-1205 OT Time Calculation (min): 36 min  Charges: OT General Charges $OT Visit: 1 Visit OT Treatments $Self Care/Home Management : 23-37 mins  Hulda Humphrey OTR/L [place] 07/11/2017, 1:13 PM

## 2017-07-11 NOTE — Progress Notes (Signed)
Patient alert and oriented, mae's, voiding adequate amount of urine, swallowing without difficulty, c/o moderate pain and meds given prior to discharged for ride and discomfort. Patient discharged home with family. Script and discharged instructions given to patient. RN educated patient and family on the importance of how to take different kinds of opiods and to avoid taking multiple pain medication together.  Patient and family stated understanding of instructions given. Patient has an appointment with Dr. Lynann Bologna in two weeks.

## 2017-07-11 NOTE — Progress Notes (Signed)
Patient walking with physical therapy noted to be weak and taking back to room. After assessment RN noted patient not responding well and looked lethargic. Vitals taken and oxygen applied per protocol.  RN also noted patient was up all night without adequate rest, RN informed patient to wear CPAP and get some sleep  And will assess her again. MD notified and new orders received and awaiting result.

## 2017-07-12 DIAGNOSIS — Z981 Arthrodesis status: Secondary | ICD-10-CM | POA: Diagnosis not present

## 2017-07-12 DIAGNOSIS — I10 Essential (primary) hypertension: Secondary | ICD-10-CM | POA: Diagnosis not present

## 2017-07-12 DIAGNOSIS — M960 Pseudarthrosis after fusion or arthrodesis: Secondary | ICD-10-CM | POA: Diagnosis not present

## 2017-07-12 DIAGNOSIS — F319 Bipolar disorder, unspecified: Secondary | ICD-10-CM | POA: Diagnosis not present

## 2017-07-12 DIAGNOSIS — Z9989 Dependence on other enabling machines and devices: Secondary | ICD-10-CM | POA: Diagnosis not present

## 2017-07-12 DIAGNOSIS — G4730 Sleep apnea, unspecified: Secondary | ICD-10-CM | POA: Diagnosis not present

## 2017-07-12 DIAGNOSIS — Z85828 Personal history of other malignant neoplasm of skin: Secondary | ICD-10-CM | POA: Diagnosis not present

## 2017-07-12 DIAGNOSIS — K219 Gastro-esophageal reflux disease without esophagitis: Secondary | ICD-10-CM | POA: Diagnosis not present

## 2017-07-13 ENCOUNTER — Encounter (HOSPITAL_COMMUNITY): Payer: Self-pay | Admitting: Pharmacy Technician

## 2017-07-13 ENCOUNTER — Inpatient Hospital Stay (HOSPITAL_COMMUNITY)
Admission: EM | Admit: 2017-07-13 | Discharge: 2017-07-16 | DRG: 948 | Disposition: A | Discharge status: Skilled Nursing Facility | Payer: PPO | Attending: Orthopedic Surgery | Admitting: Orthopedic Surgery

## 2017-07-13 DIAGNOSIS — I440 Atrioventricular block, first degree: Secondary | ICD-10-CM | POA: Diagnosis not present

## 2017-07-13 DIAGNOSIS — Z79891 Long term (current) use of opiate analgesic: Secondary | ICD-10-CM

## 2017-07-13 DIAGNOSIS — Z7982 Long term (current) use of aspirin: Secondary | ICD-10-CM | POA: Diagnosis not present

## 2017-07-13 DIAGNOSIS — Z981 Arthrodesis status: Secondary | ICD-10-CM | POA: Diagnosis not present

## 2017-07-13 DIAGNOSIS — I10 Essential (primary) hypertension: Secondary | ICD-10-CM | POA: Diagnosis not present

## 2017-07-13 DIAGNOSIS — G8918 Other acute postprocedural pain: Secondary | ICD-10-CM

## 2017-07-13 DIAGNOSIS — M1990 Unspecified osteoarthritis, unspecified site: Secondary | ICD-10-CM | POA: Diagnosis present

## 2017-07-13 DIAGNOSIS — F319 Bipolar disorder, unspecified: Secondary | ICD-10-CM | POA: Diagnosis not present

## 2017-07-13 DIAGNOSIS — M4326 Fusion of spine, lumbar region: Secondary | ICD-10-CM | POA: Diagnosis not present

## 2017-07-13 DIAGNOSIS — M5416 Radiculopathy, lumbar region: Secondary | ICD-10-CM | POA: Diagnosis not present

## 2017-07-13 DIAGNOSIS — M5116 Intervertebral disc disorders with radiculopathy, lumbar region: Secondary | ICD-10-CM | POA: Diagnosis not present

## 2017-07-13 DIAGNOSIS — Z85828 Personal history of other malignant neoplasm of skin: Secondary | ICD-10-CM | POA: Diagnosis not present

## 2017-07-13 DIAGNOSIS — C50912 Malignant neoplasm of unspecified site of left female breast: Secondary | ICD-10-CM | POA: Diagnosis not present

## 2017-07-13 DIAGNOSIS — K219 Gastro-esophageal reflux disease without esophagitis: Secondary | ICD-10-CM | POA: Diagnosis not present

## 2017-07-13 DIAGNOSIS — G4730 Sleep apnea, unspecified: Secondary | ICD-10-CM | POA: Diagnosis not present

## 2017-07-13 DIAGNOSIS — M5489 Other dorsalgia: Secondary | ICD-10-CM | POA: Diagnosis not present

## 2017-07-13 DIAGNOSIS — Z79899 Other long term (current) drug therapy: Secondary | ICD-10-CM | POA: Diagnosis not present

## 2017-07-13 DIAGNOSIS — E669 Obesity, unspecified: Secondary | ICD-10-CM | POA: Diagnosis present

## 2017-07-13 DIAGNOSIS — Z888 Allergy status to other drugs, medicaments and biological substances status: Secondary | ICD-10-CM

## 2017-07-13 DIAGNOSIS — Z9049 Acquired absence of other specified parts of digestive tract: Secondary | ICD-10-CM

## 2017-07-13 DIAGNOSIS — M5459 Other low back pain: Secondary | ICD-10-CM | POA: Diagnosis present

## 2017-07-13 DIAGNOSIS — M545 Low back pain: Secondary | ICD-10-CM | POA: Diagnosis not present

## 2017-07-13 DIAGNOSIS — K5900 Constipation, unspecified: Secondary | ICD-10-CM | POA: Diagnosis not present

## 2017-07-13 DIAGNOSIS — M129 Arthropathy, unspecified: Secondary | ICD-10-CM | POA: Diagnosis not present

## 2017-07-13 DIAGNOSIS — R52 Pain, unspecified: Secondary | ICD-10-CM | POA: Diagnosis not present

## 2017-07-13 DIAGNOSIS — M2550 Pain in unspecified joint: Secondary | ICD-10-CM | POA: Diagnosis not present

## 2017-07-13 DIAGNOSIS — Z853 Personal history of malignant neoplasm of breast: Secondary | ICD-10-CM | POA: Diagnosis not present

## 2017-07-13 DIAGNOSIS — Z881 Allergy status to other antibiotic agents status: Secondary | ICD-10-CM

## 2017-07-13 DIAGNOSIS — Z7401 Bed confinement status: Secondary | ICD-10-CM | POA: Diagnosis not present

## 2017-07-13 DIAGNOSIS — C439 Malignant melanoma of skin, unspecified: Secondary | ICD-10-CM | POA: Diagnosis not present

## 2017-07-13 DIAGNOSIS — M6281 Muscle weakness (generalized): Secondary | ICD-10-CM | POA: Diagnosis not present

## 2017-07-13 HISTORY — DX: Other low back pain: M54.59

## 2017-07-13 HISTORY — DX: Other acute postprocedural pain: G89.18

## 2017-07-13 LAB — CBC WITH DIFFERENTIAL/PLATELET
BASOS PCT: 0 %
Basophils Absolute: 0 10*3/uL (ref 0.0–0.1)
Eosinophils Absolute: 0.6 10*3/uL (ref 0.0–0.7)
Eosinophils Relative: 7 %
HEMATOCRIT: 28.6 % — AB (ref 36.0–46.0)
HEMOGLOBIN: 9.4 g/dL — AB (ref 12.0–15.0)
LYMPHS PCT: 13 %
Lymphs Abs: 1.1 10*3/uL (ref 0.7–4.0)
MCH: 28.8 pg (ref 26.0–34.0)
MCHC: 32.9 g/dL (ref 30.0–36.0)
MCV: 87.7 fL (ref 78.0–100.0)
MONOS PCT: 8 %
Monocytes Absolute: 0.7 10*3/uL (ref 0.1–1.0)
NEUTROS ABS: 5.8 10*3/uL (ref 1.7–7.7)
Neutrophils Relative %: 72 %
Platelets: 271 10*3/uL (ref 150–400)
RBC: 3.26 MIL/uL — ABNORMAL LOW (ref 3.87–5.11)
RDW: 12.8 % (ref 11.5–15.5)
WBC: 8.2 10*3/uL (ref 4.0–10.5)

## 2017-07-13 MED ORDER — METHOCARBAMOL 1000 MG/10ML IJ SOLN
500.0000 mg | Freq: Four times a day (QID) | INTRAVENOUS | Status: DC | PRN
  Filled 2017-07-13: qty 5

## 2017-07-13 MED ORDER — ONDANSETRON HCL 4 MG/2ML IJ SOLN
4.0000 mg | Freq: Four times a day (QID) | INTRAMUSCULAR | Status: DC | PRN
  Administered 2017-07-15: 4 mg via INTRAVENOUS
  Filled 2017-07-13: qty 2

## 2017-07-13 MED ORDER — SODIUM CHLORIDE 0.9 % IV SOLN
INTRAVENOUS | Status: DC
  Administered 2017-07-13 – 2017-07-16 (×4): via INTRAVENOUS

## 2017-07-13 MED ORDER — CYCLOSPORINE 0.05 % OP EMUL
1.0000 [drp] | Freq: Two times a day (BID) | OPHTHALMIC | Status: DC
  Administered 2017-07-14 – 2017-07-16 (×4): 1 [drp] via OPHTHALMIC
  Filled 2017-07-13 (×7): qty 1

## 2017-07-13 MED ORDER — DIAZEPAM 5 MG PO TABS: 5.0000 mg | ORAL_TABLET | Freq: Four times a day (QID) | ORAL | Status: DC | PRN

## 2017-07-13 MED ORDER — TRAZODONE HCL 50 MG PO TABS
200.0000 mg | ORAL_TABLET | Freq: Every day | ORAL | Status: DC
  Administered 2017-07-13 – 2017-07-15 (×3): 200 mg via ORAL
  Filled 2017-07-13 (×3): qty 4

## 2017-07-13 MED ORDER — LATANOPROST 0.005 % OP SOLN
1.0000 [drp] | Freq: Every day | OPHTHALMIC | Status: DC
  Administered 2017-07-14 – 2017-07-15 (×2): 1 [drp] via OPHTHALMIC
  Filled 2017-07-13: qty 2.5

## 2017-07-13 MED ORDER — ACETAMINOPHEN 650 MG RE SUPP: 650.0000 mg | Freq: Four times a day (QID) | RECTAL | Status: DC | PRN

## 2017-07-13 MED ORDER — MORPHINE SULFATE ER 15 MG PO TBCR
30.0000 mg | EXTENDED_RELEASE_TABLET | Freq: Two times a day (BID) | ORAL | Status: DC
  Administered 2017-07-13 – 2017-07-16 (×6): 30 mg via ORAL
  Filled 2017-07-13 (×6): qty 2

## 2017-07-13 MED ORDER — DOCUSATE SODIUM 100 MG PO CAPS
100.0000 mg | ORAL_CAPSULE | Freq: Two times a day (BID) | ORAL | Status: DC
  Administered 2017-07-14 – 2017-07-16 (×5): 100 mg via ORAL
  Filled 2017-07-13 (×5): qty 1

## 2017-07-13 MED ORDER — ACETAMINOPHEN 500 MG PO TABS
1000.0000 mg | ORAL_TABLET | Freq: Four times a day (QID) | ORAL | Status: DC
  Administered 2017-07-14 – 2017-07-16 (×11): 1000 mg via ORAL
  Filled 2017-07-13 (×11): qty 2

## 2017-07-13 MED ORDER — HYDROMORPHONE HCL 2 MG/ML IJ SOLN
0.5000 mg | INTRAMUSCULAR | Status: DC | PRN
  Administered 2017-07-16: 0.5 mg via INTRAVENOUS
  Filled 2017-07-13: qty 1

## 2017-07-13 MED ORDER — ACETAMINOPHEN 325 MG PO TABS: 650.0000 mg | ORAL_TABLET | Freq: Four times a day (QID) | ORAL | Status: DC | PRN

## 2017-07-13 MED ORDER — MORPHINE SULFATE (PF) 4 MG/ML IV SOLN
4.0000 mg | Freq: Once | INTRAVENOUS | Status: AC
  Administered 2017-07-13: 4 mg via INTRAVENOUS
  Filled 2017-07-13: qty 1

## 2017-07-13 MED ORDER — AMLODIPINE BESYLATE 5 MG PO TABS
10.0000 mg | ORAL_TABLET | Freq: Every day | ORAL | Status: DC
  Administered 2017-07-14 – 2017-07-16 (×3): 10 mg via ORAL
  Filled 2017-07-13 (×3): qty 2

## 2017-07-13 MED ORDER — PANTOPRAZOLE SODIUM 40 MG PO TBEC
40.0000 mg | DELAYED_RELEASE_TABLET | Freq: Every day | ORAL | Status: DC
  Administered 2017-07-14 – 2017-07-16 (×3): 40 mg via ORAL
  Filled 2017-07-13 (×3): qty 1

## 2017-07-13 MED ORDER — NORTRIPTYLINE HCL 10 MG PO CAPS
10.0000 mg | ORAL_CAPSULE | Freq: Every day | ORAL | Status: DC
  Administered 2017-07-14 – 2017-07-15 (×3): 10 mg via ORAL
  Filled 2017-07-13 (×4): qty 1

## 2017-07-13 MED ORDER — ZOLPIDEM TARTRATE 5 MG PO TABS
5.0000 mg | ORAL_TABLET | Freq: Every evening | ORAL | Status: DC | PRN
  Administered 2017-07-13 – 2017-07-15 (×3): 5 mg via ORAL
  Filled 2017-07-13 (×3): qty 1

## 2017-07-13 MED ORDER — TIMOLOL MALEATE 0.5 % OP SOLN
1.0000 [drp] | Freq: Every day | OPHTHALMIC | Status: DC
  Administered 2017-07-14 – 2017-07-16 (×3): 1 [drp] via OPHTHALMIC
  Filled 2017-07-13: qty 5

## 2017-07-13 MED ORDER — ASPIRIN EC 81 MG PO TBEC
81.0000 mg | DELAYED_RELEASE_TABLET | Freq: Every day | ORAL | Status: DC
  Administered 2017-07-14 – 2017-07-16 (×3): 81 mg via ORAL
  Filled 2017-07-13 (×3): qty 1

## 2017-07-13 MED ORDER — ONDANSETRON HCL 4 MG PO TABS: 4.0000 mg | ORAL_TABLET | Freq: Four times a day (QID) | ORAL | Status: DC | PRN

## 2017-07-13 MED ORDER — OXYCODONE HCL 5 MG PO TABS
5.0000 mg | ORAL_TABLET | ORAL | Status: DC | PRN
  Administered 2017-07-13: 10 mg via ORAL
  Administered 2017-07-14: 5 mg via ORAL
  Administered 2017-07-14 – 2017-07-16 (×8): 10 mg via ORAL
  Filled 2017-07-13 (×7): qty 2
  Filled 2017-07-13: qty 1
  Filled 2017-07-13 (×2): qty 2

## 2017-07-13 MED ORDER — METHOCARBAMOL 500 MG PO TABS
500.0000 mg | ORAL_TABLET | Freq: Four times a day (QID) | ORAL | Status: DC | PRN
  Administered 2017-07-13 – 2017-07-16 (×7): 500 mg via ORAL
  Filled 2017-07-13 (×7): qty 1

## 2017-07-13 NOTE — Progress Notes (Signed)
On call paged per order to inform of patients arrival to floor. Patient admitted to 5N, oriented to unit. Repositioned in a position of stated comfort. Daughter and spouse at bedside. No needs voiced. Will continue to monitor and treat per admission orders.

## 2017-07-13 NOTE — ED Provider Notes (Signed)
[facility] EMERGENCY DEPARTMENT Provider Note   CSN: [id number] Arrival date & time: 07/13/17  1931     History   Chief Complaint Chief Complaint  Patient presents with  . Back Pain    HPI Karen Castillo is a 77 y.o. female.  HPI Patient is a 77 year old female who presents with lower back pain following surgery on Thursday.  She underwent a hardware revision to her lumbar spine on Thursday and was admitted for several days before being discharged home yesterday.  She was discharged home with home health.  She reports that since being home, her pain has been difficult to control.  She has been requiring significant assistance to get up out of the bed.  She denies any recurrent trauma or falls.  No fever or chills.  No drainage from the wound.  She states her pain is not significantly different than when she was in the hospital, she is just having trouble controlling it with her medications at home.  She is taking morphine, Valium, and oxycodone.  She denies any new weakness or numbness.  She denies any bowel or bladder changes.  Of note, she has had constipation but had a bowel movement yesterday.  Her pain is severe.  It does not radiate.  Moving makes it worse.  The pain medication makes it slightly better.  Past Medical History:  Diagnosis Date  . Arthritis   . Bipolar disorder ([place])    patient denies  . Cancer (HCC)    SKIN    ,    LEFT BREAST   . GERD (gastroesophageal reflux disease)   . Hypertension   . Sleep apnea    USES CPAP     Patient Active Problem List   Diagnosis Date Noted  . Acute post-operative pain 07/13/2017  . Radiculopathy 05/04/2014    Past Surgical History:  Procedure Laterality Date  . ABDOMINAL EXPOSURE N/A 05/04/2014   Procedure: ABDOMINAL EXPOSURE;  Surgeon: Serafina Mitchell, MD;  Location: [facility];  Service: Vascular;  Laterality: N/A;  . ANTERIOR LUMBAR FUSION N/A 05/04/2014   Procedure: ANTERIOR LUMBAR FUSION 1 LEVEL;  Surgeon:  Sinclair Ship, MD;  Location: [facility];  Service: Orthopedics;  Laterality: N/A;  Lumbar 5-sacrum 1 anterior lumbar interbody fusion with instrumentation and allograft  . BACK SURGERY     2010 ,2015LUMB FUSION, 06/2013 [facility] FUSION  . BREAST SURGERY     LEFT BRREAST BX  . CARPAL TUNNEL RELEASE     1994 RT  . CARPAL TUNNEL RELEASE Bilateral   . CHOLECYSTECTOMY     2004  . COLONOSCOPY W/ BIOPSIES    . ERCP     2009  . LEEP     [id number]  . SKIN CANCER EXCISION     X 4   . TUBAL LIGATION       OB History   None      Home Medications    Prior to Admission medications   Medication Sig Start Date End Date Taking? Authorizing Provider  diazepam (VALIUM) 5 MG tablet Take 5 mg by mouth every 6 (six) hours as needed (for spasms).   Yes [provider]  oxyCODONE-acetaminophen (PERCOCET/ROXICET) 5-325 MG tablet Take 1-2 tablets by mouth See admin instructions. Take 1-2 tablets by mouth every 4-6 hours as needed for pain   Yes [provider]  allopurinol (ZYLOPRIM) 100 MG tablet Take 100 mg by mouth daily.    [provider]  amLODipine ([place])  10 MG tablet Take 10 mg by mouth daily.    [provider]  aspirin EC 81 MG tablet Take 81 mg by mouth daily.    [provider]  atenolol (TENORMIN) 100 MG tablet Take 100 mg by mouth at bedtime.     [provider]  cloNIDine (CATAPRES) 0.1 MG tablet Take 0.1 mg by mouth at bedtime.    [provider]  Coral Calcium 1000 (390 Ca) MG TABS Take 1,000 mg by mouth 2 (two) times daily.    [provider]  cycloSPORINE (RESTASIS) 0.05 % ophthalmic emulsion Place 1 drop into both eyes 4 (four) times daily.    [provider]  Glucosamine-Chondroitin (COSAMIN DS PO) Take 2 tablets by mouth daily.    [provider]  Lactobacillus (ACIDOPHILUS PO) Take 1 capsule by mouth daily.    [provider]  lansoprazole (PREVACID) 30 MG capsule Take 30 mg by  mouth daily before breakfast. 30 minutes before breakfast    [provider]  latanoprost (XALATAN) 0.005 % ophthalmic solution Place 1 drop into both eyes at bedtime.    [provider]  losartan-hydrochlorothiazide (HYZAAR) 100-25 MG tablet Take 1 tablet by mouth daily.    [provider]  Magnesium 70 MG CAPS Take 70 mg by mouth 2 (two) times daily. [place]    [provider]  Melatonin 10 MG TABS Take 10 mg by mouth at bedtime. (2100)    [provider]  morphine (MS CONTIN) 30 MG 12 hr tablet Take 30 mg by mouth every 12 (twelve) hours.    [provider]  nortriptyline (PAMELOR) 10 MG capsule Take 20 mg by mouth at bedtime.    [provider]  polyethylene glycol (MIRALAX / GLYCOLAX) packet Take 17 g by mouth daily as needed (for constipation.).     [provider]  spironolactone (ALDACTONE) 25 MG tablet Take 25 mg by mouth daily.    [provider]  timolol (BETIMOL) 0.5 % ophthalmic solution Place 1 drop into both eyes daily.    [provider]  traZODone (DESYREL) 100 MG tablet Take 200 mg by mouth at bedtime. (2100)    [provider]  Wheat Dextrin (BENEFIBER PO) Take 15 mLs by mouth daily.    [provider]    Family History No family history on file.  Social History Social History   Tobacco Use  . Smoking status: Never Smoker  . Smokeless tobacco: Never Used  Substance Use Topics  . Alcohol use: No  . Drug use: No     Allergies   Ciprofloxacin; Pregabalin; Mirapex [pramipexole]; Amitriptyline; Arthrotec [diclofenac-misoprostol]; Augmentin [amoxicillin-pot clavulanate]; Bromfenac; Cephalexin; Conj estrog-medroxyprogest ace; Crestor [rosuvastatin]; Diclofenac-misoprostol; Erythromycin; Esomeprazole magnesium; Flexeril [cyclobenzaprine]; Lescol [fluvastatin]; Levofloxacin; Metaxalone; Metoclopramide; Nexium [esomeprazole]; Pravastatin; Propoxyphene; Rofecoxib;  Statins; Sulfur; and Zoloft [sertraline hcl]   Review of Systems Review of Systems  Constitutional: Negative for chills and fever.  HENT: Negative for ear pain and sore throat.   Eyes: Negative for pain and visual disturbance.  Respiratory: Negative for cough and shortness of breath.   Cardiovascular: Negative for chest pain and palpitations.  Gastrointestinal: Positive for constipation. Negative for abdominal pain and vomiting.  Genitourinary: Negative for dysuria and hematuria.  Musculoskeletal: Positive for back pain. Negative for arthralgias.  Skin: Positive for wound. Negative for color change and rash.  Neurological: Negative for seizures and syncope.  All other systems reviewed and are negative.    Physical Exam Updated  Vital Signs BP (!) 163/60 (BP Location: Left Arm)   Pulse 92   Temp 98.3 F (36.8 C) (Oral)   Resp 18   SpO2 93%   Physical Exam  Constitutional: She is oriented to person, place, and time. She appears well-developed and well-nourished. No distress.  HENT:  Head: Normocephalic and atraumatic.  Eyes: Conjunctivae are normal.  Neck: Neck supple.  Cardiovascular: Normal rate and regular rhythm.  No murmur heard. Pulmonary/Chest: Effort normal and breath sounds normal. No respiratory distress.  Abdominal: Soft. There is no tenderness.  Musculoskeletal: She exhibits tenderness. She exhibits no edema.  There is a dressing in place over her lower lumbar spine wound.  The dressing is clean dry and intact.  There is significant tenderness to palpation over this area.  There are no surrounding skin changes to suggest sialitis.  Neurological: She is alert and oriented to person, place, and time. No cranial nerve deficit. She exhibits normal muscle tone. Coordination normal.  Patient has 5 out of 5 strength in bilateral upper extremities.  Sensation to light touch is intact in these extremities.  To her bilateral lower extreme knees, she has 5 out of 5  plantarflexion and dorsiflexion.  5 out of 5 knee flexion and extension.  4 out of 5 hip flexion.  She states that her hip weakness is her baseline.  Sensation to light touch is intact throughout her lower extreme knees.  Lower extremity reflexes limited by her obesity.  Skin: Skin is warm and dry.  Psychiatric: She has a normal mood and affect.  Nursing note and vitals reviewed.    ED Treatments / Results  Labs (all labs ordered are listed, but only abnormal results are displayed) Labs Reviewed - No data to display  EKG EKG Interpretation  Date/Time:  Monday Jul 13 2017 19:53:51 EDT Ventricular Rate:  91 PR Interval:  224 QRS Duration: 112 QT Interval:  364 QTC Calculation: 447 R Axis:   65 Text Interpretation:  Sinus rhythm with 1st degree A-V block Nonspecific ST abnormality Abnormal ECG since last tracing no significant change Confirmed by Noemi Chapel [phone]) on 07/13/2017 8:38:38 PM   Radiology No results found.  Procedures Procedures (including critical care time)  Medications Ordered in ED Medications  morphine 4 MG/ML injection 4 mg (has no administration in time range)     Initial Impression / Assessment and Plan / ED Course  I have reviewed the triage vital signs and the nursing notes.  Pertinent labs & imaging results that were available during my care of the patient were reviewed by me and considered in my medical decision making (see chart for details).     Patient presents with ongoing back pain following her recent surgery.  See HPI for full details.  She went home yesterday after being hospitalized for 4 days.  She has home health set up, however her family is having difficulty getting her up and out of bed.  The patient reports that her pain is poorly controlled at home.  She is taking morphine, Valium, and oxycodone.  Here, patient is overall well-appearing.  She has pain in her lumbar spine directly over the area where she just had surgery.  There are no  cellulitic changes.  No drainage from the wound.  She has no acute neurologic changes.  I have spoken with the orthopedic surgery office who performed her surgery.  They spoke with Dr. Lynann Bologna who will be admitting the patient for acute pain control and consideration of  rehab placement.  Final Clinical Impressions(s) / ED Diagnoses   Final diagnoses:  Acute post-operative pain    ED Discharge Orders    None       Clifton James, MD 07/13/17 2039    Noemi Chapel, MD 07/15/17 [phone]

## 2017-07-13 NOTE — ED Triage Notes (Signed)
Pt with back surgery last week, ever since surgery has had back pain preventing her from sitting up or bearing weight. Pt with decreased oral intake. Denies fevers. BP 196/76, HR 93, 92% RA, RR 18.

## 2017-07-13 NOTE — ED Provider Notes (Signed)
I saw and evaluated the patient, reviewed the resident's note and I agree with the findings and plan.  Pertinent History: Here with ongoing severe back pain - recently d/c after surgery - not doing well at home with ADL's and though may have home health, they have not started yet =- pt can't care for herself, needs increased pain control  Pertinent Exam findings: pt with no neuro def on exam, in mild pain - wosre with movement of legs, no fevers, no tachycardia,   Will admit to hospital to NS service - resident d/w NS   EKG Interpretation  Date/Time:  Monday Jul 13 2017 19:53:51 EDT Ventricular Rate:  91 PR Interval:  224 QRS Duration: 112 QT Interval:  364 QTC Calculation: 447 R Axis:   65 Text Interpretation:  Sinus rhythm with 1st degree A-V block Nonspecific ST abnormality Abnormal ECG since last tracing no significant change Confirmed by Noemi Chapel [phone]) on 07/13/2017 8:38:38 PM        I personally interpreted the EKG as well as the resident and agree with the interpretation on the resident's chart.  Final diagnoses:  Acute post-operative pain      Noemi Chapel, MD 07/15/17 [phone]

## 2017-07-13 NOTE — H&P (Signed)
PREOPERATIVE H&P  Chief Complaint: back pain  HPI: Karen Castillo is a 77 y.o. female who presents for evaluation of back pain after surgery 5 days ago.  She was discharged home but is failing home placement.  Her family is unable to take care of her.  She returns to the hospital saying that she is unchanged physically but unable to be cared for at home. Her pain has been ongoing and has been worsening. She has failed home care.  I spoke with the ER physician who feels she needs admission and that there has been no change in her overall physical condion and that additional imaging is not required.  Dr. Lynann Bologna will evaluate her further in the AM.    Past Medical History:  Diagnosis Date  . Arthritis   . Bipolar disorder ([place])    patient denies  . Cancer (HCC)    SKIN    ,    LEFT BREAST   . GERD (gastroesophageal reflux disease)   . Hypertension   . Sleep apnea    USES CPAP    Past Surgical History:  Procedure Laterality Date  . ABDOMINAL EXPOSURE N/A 05/04/2014   Procedure: ABDOMINAL EXPOSURE;  Surgeon: Serafina Mitchell, MD;  Location: [facility];  Service: Vascular;  Laterality: N/A;  . ANTERIOR LUMBAR FUSION N/A 05/04/2014   Procedure: ANTERIOR LUMBAR FUSION 1 LEVEL;  Surgeon: Sinclair Ship, MD;  Location: [facility];  Service: Orthopedics;  Laterality: N/A;  Lumbar 5-sacrum 1 anterior lumbar interbody fusion with instrumentation and allograft  . BACK SURGERY     2010 ,2015LUMB FUSION, 06/2013 [facility] FUSION  . BREAST SURGERY     LEFT BRREAST BX  . CARPAL TUNNEL RELEASE     1994 RT  . CARPAL TUNNEL RELEASE Bilateral   . CHOLECYSTECTOMY     2004  . COLONOSCOPY W/ BIOPSIES    . ERCP     2009  . LEEP     [id number]  . SKIN CANCER EXCISION     X 4   . TUBAL LIGATION     Social History   Socioeconomic History  . Marital status: Widowed    Spouse name: Not on file  . Number of children: Not on file  . Years of education: Not on file  . Highest education level: Not on file   Occupational History  . Not on file  Social Needs  . Financial resource strain: Not on file  . Food insecurity:    Worry: Not on file    Inability: Not on file  . Transportation needs:    Medical: Not on file    Non-medical: Not on file  Tobacco Use  . Smoking status: Never Smoker  . Smokeless tobacco: Never Used  Substance and Sexual Activity  . Alcohol use: No  . Drug use: No  . Sexual activity: Not on file  Lifestyle  . Physical activity:    Days per week: Not on file    Minutes per session: Not on file  . Stress: Not on file  Relationships  . Social connections:    Talks on phone: Not on file    Gets together: Not on file    Attends religious service: Not on file    Active member of club or organization: Not on file    Attends meetings of clubs or organizations: Not on file    Relationship status: Not on file  Other Topics Concern  . Not on file  Social  History Narrative  . Not on file   No family history on file. Allergies  Allergen Reactions  . Ciprofloxacin Swelling    Tongue swells  . Cyclobenzaprine Swelling    Feet swell   . Pregabalin Anaphylaxis, Shortness Of Breath, Swelling and Other (See Comments)    Dizziness also  . Fluvastatin Other (See Comments)    Leg pain   . Meloxicam Swelling    Feet became swollen  . Metaxalone Rash and Other (See Comments)    Flushing of the skin and sore throat, too  . Pramipexole Nausea And Vomiting  . Pravastatin Other (See Comments)    Leg pain  . Rosuvastatin Other (See Comments)    Leg pain  . Sertraline Other (See Comments)    Sensitive teeth  . Statins Other (See Comments)    Leg pain     . Amitriptyline Other (See Comments)    Weird feeling all over  . Arthrotec [Diclofenac-Misoprostol] Diarrhea  . Augmentin [Amoxicillin-Pot Clavulanate] Diarrhea    Has patient had a PCN reaction causing immediate rash, facial/tongue/throat swelling, SOB or lightheadedness with hypotension: No Has patient had a  PCN reaction causing severe rash involving mucus membranes or skin necrosis: No Has patient had a PCN reaction that required hospitalization: No Has patient had a PCN reaction occurring within the last 10 years: No If all of the above answers are "NO", then may proceed with Cephalosporin use.   . Bromfenac Diarrhea and Nausea Only  . Cephalexin Diarrhea and Nausea Only  . Conj Estrog-Medroxyprogest Ace Other (See Comments)    Unsure of reaction type Couldn't tolerate  . Diclofenac-Misoprostol Nausea Only and Diarrhea  . Erythromycin Diarrhea and Nausea Only  . Esomeprazole Diarrhea  . Esomeprazole Magnesium Nausea Only  . Levofloxacin Other (See Comments) and Nausea Only    Stomach upset  . Methadone Diarrhea  . Metoclopramide Diarrhea and Nausea Only  . Propoxyphene Diarrhea and Nausea Only  . Rofecoxib Diarrhea and Nausea Only  . Sulfur Diarrhea and Nausea Only  . Tramadol Diarrhea  . Zoloft [Sertraline Hcl] Other (See Comments)    Sensitivity with teeth   Prior to Admission medications   Medication Sig Start Date End Date Taking? Authorizing Provider  allopurinol (ZYLOPRIM) 100 MG tablet Take 100 mg by mouth daily.   Yes [provider]  azelastine (ASTELIN) 0.1 % nasal spray Place 1-2 sprays into both nostrils 2 (two) times daily as needed for rhinitis or allergies.    Yes [provider]  Calcium Carb-Cholecalciferol (CALCIUM+D3 PO) Take 1 tablet by mouth 2 (two) times daily.   Yes [provider]  Cholecalciferol (VITAMIN D3) 2000 units TABS Take 2,000 Units by mouth daily.   Yes [provider]  cycloSPORINE (RESTASIS) 0.05 % ophthalmic emulsion Place 1 drop into both eyes 4 (four) times daily.   Yes [provider]  diazepam (VALIUM) 5 MG tablet Take 5 mg by mouth every 6 (six) hours as needed (for spasms).   Yes [provider]  fexofenadine (ALLEGRA) 180 MG tablet Take 180 mg by mouth at bedtime.   Yes [provider]  GINKGO BILOBA EXTRACT PO Take 1 tablet by mouth daily.    Yes [provider]  lansoprazole (PREVACID) 30 MG capsule Take 30 mg by mouth daily before breakfast. 30 minutes before breakfast   Yes [provider]  latanoprost (XALATAN) 0.005 % ophthalmic solution Place 1 drop into both eyes at bedtime.   Yes [provider]  Melatonin 10 MG TABS Take 10 mg by mouth at bedtime. (2100)   Yes [provider]  morphine (MS CONTIN) 30 MG 12 hr tablet Take 30 mg by mouth every 12 (twelve) hours.   Yes [provider]  nortriptyline (PAMELOR) 10 MG capsule Take 10 mg by mouth at bedtime.    Yes [provider]  oxyCODONE-acetaminophen (PERCOCET/ROXICET) 5-325 MG tablet Take 1-2 tablets by mouth See admin instructions. Take 1-2 tablets by mouth every 4-6 hours as needed for pain   Yes [provider]  polyethylene glycol (MIRALAX / GLYCOLAX) packet Take 17 g by mouth daily.    Yes [provider]  promethazine (PHENERGAN) 12.5 MG tablet Take 12.5 mg by mouth 2 (two) times daily as needed for nausea or vomiting.  06/22/17  Yes [provider]  timolol (BETIMOL) 0.5 % ophthalmic solution Place 1 drop into both eyes daily.   Yes [provider]  traZODone (DESYREL) 100 MG tablet Take 200 mg by mouth at bedtime.    Yes [provider]  amLODipine ([place]VASC) 10 MG tablet Take 10 mg by mouth daily.    [provider]  aspirin EC 81 MG tablet Take 81 mg by mouth daily.    [provider]  atenolol (TENORMIN) 100 MG tablet Take 100 mg by mouth at bedtime.     [provider]  cloNIDine (CATAPRES) 0.1 MG tablet Take 0.1 mg by mouth at bedtime.    [provider]  Glucosamine-Chondroitin (COSAMIN DS PO) Take 2 tablets by mouth daily.    [provider]  Lactobacillus (ACIDOPHILUS PO) Take 1 capsule by mouth daily.    [provider]   losartan-hydrochlorothiazide (HYZAAR) 100-25 MG tablet Take 1 tablet by mouth daily.    [provider]  Magnesium 70 MG CAPS Take 140 mg by mouth every morning. [place]     [provider]  Omega-3 Fatty Acids (FISH OIL) 500 MG CAPS Take 500 mg by mouth 2 (two) times daily.    [provider]  spironolactone (ALDACTONE) 25 MG tablet Take 25 mg by mouth daily.    [provider]  Wheat Dextrin (BENEFIBER PO) Take 15 g by mouth daily.     [provider]     Positive ROS: none  All other systems have been reviewed and were otherwise negative with the exception of those mentioned in the HPI and as above.  Physical Exam: Vitals:   07/13/17 2106 07/13/17 2128  BP:  (!) 174/56  Pulse:    Resp:    Temp: 98.7 F (37.1 C)   SpO2:  94%    Physical exam deferred until morning  Assessment/Plan: 77 yo female who underwent surgery of the back last Thursday and was discharged home Sunday.  She is having worsening pain and is unable to be cared for at home.  I am admitting her to Dr. Laurena Bering service and his team will follow up with her in the morning.    Alta Corning, MD 07/13/2017 9:54 PM

## 2017-07-13 NOTE — Clinical Social Work Note (Signed)
Clinical Social Work Assessment  Patient Details  Name: Karen Castillo MRN: [id number] Date of Birth: 12/10/1940  Date of referral:  07/13/17               Reason for consult:  Facility Placement                Permission sought to share information with:  Facility Sport and exercise psychologist, Tourist information centre manager, Family Supports Permission granted to share information::  Yes, Verbal Permission Granted  Name::        Agency::     Relationship::     Contact Information:     Housing/Transportation Living arrangements for the past 2 months:  Single Family Home Source of Information:  Patient, Adult Children Patient Interpreter Needed:  None Criminal Activity/Legal Involvement Pertinent to Current Situation/Hospitalization:  No - Comment as needed Significant Relationships:  Adult Children Lives with:  Adult Children Do you feel safe going back to the place where you live?  No Need for family participation in patient care:  Yes (Comment)  Care giving concerns:  CSW consulted due to pt being discharged on 5/4 with home health. Pt returned to the ED due to needing a higher level of care, SNF.    Social Worker assessment / plan:  CSW met with pt, pt's daughter, and pt's son in law with CM at pt's bedside. Pt's family members expressed concerns about pt being discharged with home health. Pt's son in law explained that she has only drank one protein shake and has limited water intake since returning home. Pt's family members explained that [place] came out today to assess the pt. The PT with [facility] stated that pt should be in a SNF. HH started paperwork to have pt placed in SNF.   Family members would like pt to go to [facility]. [facility] is associated with [facility]. Pt will need to be reassessed by PT. CSW explained the process for SNF placement. CSW provided family members with the list of SNFs. CSW explained that placement is based on which SNFs accept pt and  bed availability. CSW encouraged family members to pick two back up SNF placements.   Employment status:  Retired Forensic scientist:  Other (Comment Required)([employer]) PT Recommendations:  Not assessed at this time Information / Referral to community resources:  [place]  Patient/Family's Response to care:  Pt and pt's family agreeable to pt going to SNF. Pt is being admitted for additional medical workup.   Patient/Family's Understanding of and Emotional Response to Diagnosis, Current Treatment, and Prognosis:  Pt is still undergoing medical work up.   Emotional Assessment Appearance:  Appears stated age Attitude/Demeanor/Rapport:    Affect (typically observed):  Accepting, Adaptable, Calm Orientation:  Oriented to Self, Oriented to Place, Oriented to  Time, Oriented to Situation Alcohol / Substance use:    Psych involvement (Current and /or in the community):  No (Comment)  Discharge Needs  Concerns to be addressed:  Discharge Planning Concerns, Home Safety Concerns, Care Coordination Readmission within the last 30 days:  Yes Current discharge risk:  Physical Impairment Barriers to Discharge:  Continued Medical Work up   [employer], LCSW 07/13/2017, 8:44 PM

## 2017-07-14 ENCOUNTER — Other Ambulatory Visit: Payer: Self-pay

## 2017-07-14 ENCOUNTER — Encounter (HOSPITAL_COMMUNITY): Payer: Self-pay

## 2017-07-14 MED ORDER — ORAL CARE MOUTH RINSE: 15.0000 mL | Freq: Two times a day (BID) | OROMUCOSAL | Status: DC

## 2017-07-14 MED ORDER — CHLORHEXIDINE GLUCONATE 0.12 % MT SOLN
15.0000 mL | Freq: Two times a day (BID) | OROMUCOSAL | Status: DC
  Administered 2017-07-14 – 2017-07-15 (×2): 15 mL via OROMUCOSAL
  Filled 2017-07-14 (×3): qty 15

## 2017-07-14 NOTE — Evaluation (Signed)
Physical Therapy Evaluation Patient Details Name: Karen Castillo MRN: [id number] DOB: 01/10/1941 Today's Date: 07/14/2017   History of Present Illness  Pt is a 77 y/o female who presents s/p L5-S1 revision of posterior spinal fusion on 07/09/17. She went home on 07/11/17 but could not mobilize at home or get pain under control and returned on 07/13/17.  PMH significant for HTN, CA, bipolar disorder, B carpal tunnel release.  Clinical Impression  Pt admitted with above diagnosis. Pt currently with functional limitations due to the deficits listed below (see PT Problem List). Pt very resistant to mobility. Mod A to get to sitting. Pt continually staying that she cannot stand and needs to be lifted up. Cued her that this therapist would assist once there were visible signs that she was pushing through her LE's. At that point, she stood with min A from elevated bed. Max encouragement for sidesteps to [facility] and then returned to supine.  Pt will benefit from skilled PT to increase their independence and safety with mobility to allow discharge to the venue listed below.       Follow Up Recommendations SNF;Supervision/Assistance - 24 hour    Equipment Recommendations  None recommended by PT    Recommendations for Other Services OT consult     Precautions / Restrictions Precautions Precautions: Fall;Back Precaution Booklet Issued: No Precaution Comments: reviewed precautions Required Braces or Orthoses: Spinal Brace Spinal Brace: Thoracolumbosacral orthotic;Applied in sitting position Restrictions Weight Bearing Restrictions: No      Mobility  Bed Mobility Overal bed mobility: Needs Assistance Bed Mobility: Rolling;Sidelying to Sit;Sit to Sidelying Rolling: Min assist Sidelying to sit: Mod assist     Sit to sidelying: Mod assist General bed mobility comments: vc's for no twisting with rolling. Min A to LE's for rolling to L. Mod A at trunk for SL to sit and mod A at LE's for sit to  SL  Transfers Overall transfer level: Needs assistance Equipment used: Rolling walker (2 wheeled) Transfers: Sit to/from Stand Sit to Stand: Min assist         General transfer comment: pt kept saying she could not stand and that someone would have to pull her up. Max instrcutional cues and encouragement given for anterior translation of hips and pushing through legs but once she began moving, only needed min A to actually stand  Ambulation/Gait Ambulation/Gait assistance: Min assist Ambulation Distance (Feet): 2 Feet Assistive device: Rolling walker (2 wheeled) Gait Pattern/deviations: Step-to pattern Gait velocity: Decreased Gait velocity interpretation: <1.31 ft/sec, indicative of household ambulator General Gait Details: pt continually sating that she had to sit back down. Max encouragement to step to [facility]. Had difficulty stepping R foot. RW had to be moved for her. No knee buckling in standing   Stairs            Wheelchair Mobility    Modified Rankin (Stroke Patients Only)       Balance Overall balance assessment: Needs assistance Sitting-balance support: Feet supported Sitting balance-Leahy Scale: Fair     Standing balance support: Bilateral upper extremity supported;During functional activity Standing balance-Leahy Scale: Poor Standing balance comment: Required hands-on support for static standing balance.                              Pertinent Vitals/Pain Pain Assessment: Faces Faces Pain Scale: Hurts whole lot Pain Location: back Pain Descriptors / Indicators: Constant;Moaning;Guarding Pain Intervention(s): Limited activity within patient's tolerance;Monitored during session;Premedicated before  session    Home Living Family/patient expects to be discharged to:: Private residence Living Arrangements: Children Available Help at Discharge: Family;Available 24 hours/day Type of Home: House Home Access: Stairs to enter Entrance Stairs-Rails:  None Entrance Stairs-Number of Steps: 1 Home Layout: Two level;Able to live on main level with bedroom/bathroom Home Equipment: Gilford Rile - 2 wheels;Cane - quad;Bedside commode;Shower seat;Grab bars - tub/shower;Hand held shower head;Adaptive equipment Additional Comments: pt's on in law could not get pt to the [facility], took 3 people to get pt into home in w/c because she couldn't walk into home    Prior Function Level of Independence: Needs assistance   Gait / Transfers Assistance Needed: unable since surgery  ADL's / Homemaking Assistance Needed: total since surgery  Comments: was independent with RW prior to surgery     Hand Dominance   Dominant Hand: Right    Extremity/Trunk Assessment   Upper Extremity Assessment Upper Extremity Assessment: Overall WFL for tasks assessed    Lower Extremity Assessment Lower Extremity Assessment: Generalized weakness    Cervical / Trunk Assessment Cervical / Trunk Assessment: Kyphotic Cervical / Trunk Exceptions: forward head, rounded shoulders  Communication   Communication: No difficulties  Cognition Arousal/Alertness: Awake/alert Behavior During Therapy: Anxious Overall Cognitive Status: Within Functional Limits for tasks assessed                                        General Comments General comments (skin integrity, edema, etc.): O2 sats remained mid 90's on RA    Exercises Other Exercises Other Exercises: heel slides 5 R and L Other Exercises: pelvic tilt 10x   Assessment/Plan    PT Assessment Patient needs continued PT services  PT Problem List Decreased strength;Decreased range of motion;Decreased activity tolerance;Decreased balance;Decreased mobility;Decreased knowledge of use of DME;Decreased safety awareness;Decreased knowledge of precautions;Pain;Obesity       PT Treatment Interventions DME instruction;Gait training;Stair training;Functional mobility training;Therapeutic activities;Therapeutic  exercise;Neuromuscular re-education;Patient/family education    PT Goals (Current goals can be found in the Care Plan section)  Acute Rehab PT Goals Patient Stated Goal: to be more functional without pain PT Goal Formulation: With patient Time For Goal Achievement: 07/28/17 Potential to Achieve Goals: Fair    Frequency Min 5X/week   Barriers to discharge Inaccessible home environment steps into home    Co-evaluation               AM-PAC PT "6 Clicks" Daily Activity  Outcome Measure Difficulty turning over in bed (including adjusting bedclothes, sheets and blankets)?: Unable Difficulty moving from lying on back to sitting on the side of the bed? : Unable Difficulty sitting down on and standing up from a chair with arms (e.g., wheelchair, bedside commode, etc,.)?: Unable Help needed moving to and from a bed to chair (including a wheelchair)?: Total Help needed walking in hospital room?: Total Help needed climbing 3-5 steps with a railing? : Total 6 Click Score: 6    End of Session Equipment Utilized During Treatment: Gait belt Activity Tolerance: Patient limited by pain Patient left: in bed;with call bell/phone within reach;with family/visitor present Nurse Communication: Mobility status PT Visit Diagnosis: Unsteadiness on feet (R26.81);Muscle weakness (generalized) (M62.81);Difficulty in walking, not elsewhere classified (R26.2);Pain Pain - part of body: (back)    Time: 7253-6644 PT Time Calculation (min) (ACUTE ONLY): 31 min   Charges:   PT Evaluation $PT Eval Moderate Complexity: 1 Mod PT  Treatments $Therapeutic Activity: 8-22 mins   PT G Codes:        Leighton Roach, PT  Acute Rehab Services  [place] 07/14/2017, 10:26 AM

## 2017-07-14 NOTE — Social Work (Signed)
CSW met with patient at bedside along with son-n-law and he indicated that he was interested in [facility] SNF and gave permission to send to [facility] in [facility]. CSW explained the insurance auth process, SNF process.  CSW will f/u with patient once SNF offers received.  PASSR pending for SNF placement. CSW left 30 day note on chart.  Elissa Hefty, LCSW Clinical Social Worker [phone]

## 2017-07-14 NOTE — Progress Notes (Signed)
Of note, Karen Castillo' preoperative hospitalhistory and physical documentation did reflect that she has bipolar disorder. This diagnosis was automatically generated in her history documentation in error. The patient does not have bipolar disorder, as reflected in my office documentation. The purpose of this progress note is to correct the medical record.

## 2017-07-14 NOTE — Evaluation (Signed)
Occupational Therapy Evaluation Patient Details Name: Karen Castillo MRN: [id number] DOB: 02/02/1941 Today's Date: 07/14/2017    History of Present Illness Pt is a 77 y/o female who presents s/p L5-S1 revision of posterior spinal fusion on 07/09/17. She went home on 07/11/17 but could not mobilize at home or get pain under control and returned on 07/13/17.  PMH significant for HTN, CA, bipolar disorder, B carpal tunnel release.   Clinical Impression   Eval limited due to pt reports of back pain and fatigue following recent PT session. Pt with decline in function and safety with ADLs and ADL mobility with decreased strength, balance and endurance. Pt prevously at this hospital and discharged home, however as unable to care for herself and was re admitted and will d/c to a SNF for fruther mgt and rehab before return home. Pt would benefit from acute OT services to address impairments to maximixe levele of function and safety    Follow Up Recommendations  SNF    Equipment Recommendations  Other (comment)(TBA at next venue of care)    Recommendations for Other Services       Precautions / Restrictions Precautions Precautions: Fall;Back Precaution Booklet Issued: Yes (comment) Precaution Comments: reviewed precautions Required Braces or Orthoses: Spinal Brace Spinal Brace: Thoracolumbosacral orthotic;Applied in sitting position Restrictions Weight Bearing Restrictions: No      Mobility Bed Mobility Overal bed mobility: Needs Assistance Bed Mobility: Rolling;Sidelying to Sit;Sit to Sidelying Rolling: Min assist Sidelying to sit: Mod assist     Sit to sidelying: Mod assist General bed mobility comments: NT, pt fatigued/lethagris form previous PT session. Pt's son present to provide PLOF and ask questions. Per PT note pt is mod A with bed mobility  Transfers Overall transfer level: Needs assistance Equipment used: Rolling walker (2 wheeled) Transfers: Sit to/from Stand Sit to Stand:  Min assist         General transfer comment: NT, per PT note pt is min A with sit - stand from EOB    Balance Overall balance assessment: (NT) Sitting-balance support: Feet supported Sitting balance-Leahy Scale: Fair     Standing balance support: Bilateral upper extremity supported;During functional activity Standing balance-Leahy Scale: Poor Standing balance comment: Required hands-on support for static standing balance.                            ADL either performed or assessed with clinical judgement   ADL Overall ADL's : Needs assistance/impaired     Grooming: Set up;Sitting;Supervision/safety;Bed level   Upper Body Bathing: Maximal assistance;Bed level   Lower Body Bathing: Total assistance   Upper Body Dressing : Maximal assistance;Bed level   Lower Body Dressing: Total assistance     Toilet Transfer Details (indicate cue type and reason): NT, per PT note pt is min A  Toileting- Clothing Manipulation and Hygiene: Total assistance         General ADL Comments: pt declined sitting EOB or OOB acitivty due to fatigue from previous PT session and due to back pain     Vision Baseline Vision/History: Wears glasses Wears Glasses: Reading only Patient Visual Report: No change from baseline       Perception     Praxis      Pertinent Vitals/Pain Pain Assessment: 0-10 Pain Score: 8  Faces Pain Scale: Hurts whole lot Pain Location: back Pain Descriptors / Indicators: Constant;Moaning;Guarding Pain Intervention(s): Limited activity within patient's tolerance;Monitored during session;Premedicated before session  Hand Dominance Right   Extremity/Trunk Assessment Upper Extremity Assessment Upper Extremity Assessment: Generalized weakness   Lower Extremity Assessment Lower Extremity Assessment: Defer to PT evaluation   Cervical / Trunk Assessment Cervical / Trunk Assessment: Kyphotic Cervical / Trunk Exceptions: forward head, rounded  shoulders   Communication Communication Communication: No difficulties   Cognition Arousal/Alertness: Awake/alert;Lethargic;Suspect due to medications Behavior During Therapy: [facility] for tasks assessed/performed Overall Cognitive Status: Within Functional Limits for tasks assessed                                     General Comments  O2 sats remained mid 90's on RA    Exercises Exercises: Other exercises Other Exercises Other Exercises: heel slides 5 R and L Other Exercises: pelvic tilt 10x   Shoulder Instructions      Home Living Family/patient expects to be discharged to:: Private residence Living Arrangements: Children Available Help at Discharge: Family;Available 24 hours/day Type of Home: House Home Access: Stairs to enter [employer] of Steps: 1 Entrance Stairs-Rails: None Home Layout: Two level;Able to live on main level with bedroom/bathroom     Bathroom Shower/Tub: Occupational psychologist: Handicapped height Bathroom Accessibility: Yes How Accessible: Accessible via walker Home Equipment: [place] - 2 wheels;Cane - quad;Bedside commode;Shower seat;Grab bars - tub/shower;Hand held shower head;Adaptive equipment Adaptive Equipment: Reacher;Sock aid;Long-handled shoe horn;Long-handled sponge Additional Comments: pt's on in law could not get pt to the [facility], took 3 people to get pt into home in w/c because she couldn't walk into home      Prior Functioning/Environment Level of Independence: Needs assistance  Gait / Transfers Assistance Needed: unable since surgery ADL's / Homemaking Assistance Needed: total since surgery   Comments: was independent with RW prior to surgery        OT Problem List: Decreased strength;Decreased activity tolerance;Decreased knowledge of use of DME or AE;Decreased range of motion;Impaired balance (sitting and/or standing);Decreased knowledge of precautions;Obesity;Pain      OT Treatment/Interventions:  Self-care/ADL training;DME and/or AE instruction;Therapeutic activities;Patient/family education    OT Goals(Current goals can be found in the care plan section) Acute Rehab OT Goals Patient Stated Goal: to get better and go home OT Goal Formulation: With patient/family Time For Goal Achievement: 07/28/17 Potential to Achieve Goals: Good ADL Goals Pt Will Perform Grooming: with min guard assist;with supervision;with set-up;sitting Pt Will Perform Upper Body Bathing: with mod assist;sitting Pt Will Perform Lower Body Bathing: with max assist;with mod assist;sitting/lateral leans Pt Will Perform Upper Body Dressing: with mod assist;sitting Pt Will Transfer to Toilet: with min assist;with min guard assist;bedside commode Pt Will Perform Toileting - Clothing Manipulation and hygiene: with max assist;with mod assist;sitting/lateral leans;sit to/from stand Additional ADL Goal #1: Pt will recall all back precautions and maintain during ADLs and ADL mobility Additional ADL Goal #2: pt will complete bed mobility with min - min guard A to sit EOB in prep for donning back brace  OT Frequency: Min 2X/week   Barriers to D/C:            Co-evaluation              AM-PAC PT "6 Clicks" Daily Activity     Outcome Measure Help from another person eating meals?: None Help from another person taking care of personal grooming?: A Little Help from another person toileting, which includes using toliet, bedpan, or urinal?: Total Help from another person bathing (  including washing, rinsing, drying)?: Total Help from another person to put on and taking off regular upper body clothing?: A Lot Help from another person to put on and taking off regular lower body clothing?: Total 6 Click Score: 12   End of Session    Activity Tolerance: Patient limited by pain;Patient limited by fatigue;Patient limited by lethargy Patient left: in bed;with call bell/phone within reach;with family/visitor present  OT  Visit Diagnosis: Unsteadiness on feet (R26.81);Muscle weakness (generalized) (M62.81);Pain Pain - part of body: (back)                Time: 4695-0722 OT Time Calculation (min): 25 min Charges:  OT General Charges $OT Visit: 1 Visit OT Evaluation $OT Eval Moderate Complexity: 1 Mod OT Treatments $Therapeutic Activity: 8-22 mins G-Codes: OT G-codes **NOT FOR INPATIENT CLASS** Functional Assessment Tool Used: AM-PAC 6 Clicks Daily Activity     Britt Bottom 07/14/2017, 2:02 PM

## 2017-07-14 NOTE — Progress Notes (Signed)
Patient resting comfortably on CPAP , via nasal pillows (home mask and tubing) 12.0 cm H2O.

## 2017-07-14 NOTE — NC FL2 (Signed)
[place] LEVEL OF CARE SCREENING TOOL     IDENTIFICATION  Patient Name: Karen Castillo Birthdate: [birth year] Sex: female Admission Date (Current Location): 07/13/2017  [facility] and [place] Number:  [employer] and Address:  [facility] [place], [place] [address] [place], [place] [address]      Provider Number: [id number]  Attending Physician Name and Address:  Phylliss Bob, MD  Relative Name and Phone Number:  Tsosie Billing, daughter, [phone]    Current Level of Care: Hospital Recommended Level of Care: [place] Prior Approval Number:    Date Approved/Denied:   PASRR Number: pending  Discharge Plan: SNF    Current Diagnoses: Patient Active Problem List   Diagnosis Date Noted  . Acute post-operative pain 07/13/2017  . Intractable low back pain 07/13/2017  . Radiculopathy 05/04/2014    Orientation RESPIRATION BLADDER Height & Weight     Self, Time, Situation, Place  Normal Continent Weight:   Height:     BEHAVIORAL SYMPTOMS/MOOD NEUROLOGICAL BOWEL NUTRITION STATUS      Continent Diet(see DC Summary)  AMBULATORY STATUS COMMUNICATION OF NEEDS Skin   Limited Assist Verbally Normal, Other (Comment), Bruising(previous lumbar fusion)                       Personal Care Assistance Level of Assistance  Dressing, Bathing, Feeding Bathing Assistance: Maximum assistance Feeding assistance: Limited assistance Dressing Assistance: Maximum assistance     Functional Limitations Info  Sight, Hearing, Speech Sight Info: Adequate Hearing Info: Adequate Speech Info: Adequate    SPECIAL CARE FACTORS FREQUENCY  PT (By licensed PT), OT (By licensed OT)     PT Frequency: 5x week OT Frequency: 2x week            Contractures      Additional Factors Info  Code Status, Allergies Code Status Info: Full Allergies Info: CIPROFLOXACIN, CYCLOBENZAPRINE, PREGABALIN, FLUVASTATIN, MELOXICAM, METAXALONE,  PRAMIPEXOLE, PRAVASTATIN, ROSUVASTATIN, SERTRALINE, STATINS, AMITRIPTYLINE, ARTHROTEC DICLOFENAC-MISOPROSTOL, AUGMENTIN AMOXICILLIN-POT CLAVULANATE, BROMFENAC, CEPHALEXIN, CONJ ESTROG-MEDROXYPROGEST ACE, DICLOFENAC-MISOPROSTOL, ERYTHROMYCIN, ESOMEPRAZOLE, ESOMEPRAZOLE MAGNESIUM, LEVOFLOXACIN, METHADONE, METOCLOPRAMIDE, PROPOXYPHENE, ROFECOXIB, SULFUR, TRAMADOL, ZOLOFT SERTRALINE HCL            Current Medications (07/14/2017):  This is the current hospital active medication list Current Facility-Administered Medications  Medication Dose Route Frequency Provider Last Rate Last Dose  . 0.9 %  sodium chloride infusion   Intravenous Continuous Gary Fleet, PA-C 50 mL/hr at 07/13/17 2247    . acetaminophen (TYLENOL) tablet 650 mg  650 mg Oral Q6H PRN Gary Fleet, PA-C       Or  . acetaminophen (TYLENOL) suppository 650 mg  650 mg Rectal Q6H PRN Gary Fleet, PA-C      . acetaminophen (TYLENOL) tablet 1,000 mg  1,000 mg Oral Q6H Gary Fleet, PA-C   1,000 mg at 07/14/17 1126  . amLODipine (NORVASC) tablet 10 mg  10 mg Oral Daily Gary Fleet, PA-C   10 mg at 07/14/17 5621  . aspirin EC tablet 81 mg  81 mg Oral Daily Gary Fleet, PA-C   81 mg at 07/14/17 0932  . cycloSPORINE (RESTASIS) 0.05 % ophthalmic emulsion 1 drop  1 drop Both Eyes BID Gary Fleet, PA-C      . docusate sodium (COLACE) capsule 100 mg  100 mg Oral BID Gary Fleet, PA-C   100 mg at 07/14/17 0932  . HYDROmorphone (DILAUDID) injection 0.5-1 mg  0.5-1 mg Intravenous Q3H PRN Gary Fleet, PA-C      .  latanoprost (XALATAN) 0.005 % ophthalmic solution 1 drop  1 drop Both Eyes QHS Gary Fleet, PA-C      . methocarbamol (ROBAXIN) tablet 500 mg  500 mg Oral Q6H PRN Gary Fleet, PA-C   500 mg at 07/14/17 0746   Or  . methocarbamol (ROBAXIN) 500 mg in dextrose 5 % 50 mL IVPB  500 mg Intravenous Q6H PRN Gary Fleet, PA-C      . morphine (MS CONTIN) 12 hr tablet 30 mg  30 mg Oral Q12H Gary Fleet, PA-C   30 mg  at 07/14/17 0932  . nortriptyline (PAMELOR) capsule 10 mg  10 mg Oral QHS Gary Fleet, PA-C   10 mg at 07/14/17 0000  . ondansetron (ZOFRAN) tablet 4 mg  4 mg Oral Q6H PRN Gary Fleet, PA-C       Or  . ondansetron [facility]) injection 4 mg  4 mg Intravenous Q6H PRN Gary Fleet, PA-C      . oxyCODONE (Oxy IR/ROXICODONE) immediate release tablet 5-10 mg  5-10 mg Oral Q3H PRN Gary Fleet, PA-C   5 mg at 07/14/17 0746  . pantoprazole (PROTONIX) EC tablet 40 mg  40 mg Oral Daily Gary Fleet, PA-C   40 mg at 07/14/17 0932  . timolol (TIMOPTIC) 0.5 % ophthalmic solution 1 drop  1 drop Both Eyes Daily Gary Fleet, PA-C   1 drop at 07/14/17 1106  . traZODone (DESYREL) tablet 200 mg  200 mg Oral QHS Gary Fleet, PA-C   200 mg at 07/13/17 2246  . zolpidem (AMBIEN) tablet 5 mg  5 mg Oral QHS PRN Gary Fleet, PA-C   5 mg at 07/13/17 2246     Discharge Medications: Please see discharge summary for a list of discharge medications.  Relevant Imaging Results:  Relevant Lab Results:   Additional Information SS#: [id number]  [place], LCSW

## 2017-07-14 NOTE — Progress Notes (Signed)
Placed patient on CPAP via nasal prongs (home mask & tubing) 12.0 cm H20 per pt home settings.

## 2017-07-14 NOTE — Progress Notes (Signed)
    Patient admitted last night, as she was not able to care for herself at home Patient did well at her hospital admission, but has not been thriving at home C/o LBP   Physical Exam: Vitals:   07/13/17 2359 07/14/17 0542  BP:  (!) 118/50  Pulse: (!) 106 87  Resp: 18   Temp:  99.4 F (37.4 C)  SpO2: 92% 91%   Patient appears comfortable, eating breakfast NVI  Pt s/p revision lumbar fusion who was uneventfully discharged but has not been able to care for herself at home  - patient will need PT and OT, with likely transfer to rehab, given her inability to care for herself at home - continue current pain medication regimen - will need to figure out where she will ultimately get transferred to with social work

## 2017-07-14 NOTE — Progress Notes (Signed)
This RN attempted to page Dr. Lynann Bologna but was unsuccessful. Social work requested for MD to be paged to have him sign paperwork that is on the patient's chart (on 5N) regarding SNF placement. Will try again shortly.

## 2017-07-15 NOTE — Clinical Social Work Note (Signed)
Clinical Social Work Assessment  Patient Details  Name: Karen Castillo MRN: [id number] Date of Birth: October 06, 1940  Date of referral:  07/13/17               Reason for consult:  Facility Placement                Permission sought to share information with:  Facility Sport and exercise psychologist, Tourist information centre manager, Family Supports Permission granted to share information::  Yes, Verbal Permission Granted  Name::        Agency::  SNF  Relationship::     Contact Information:     Housing/Transportation Living arrangements for the past 2 months:  Single Family Home Source of Information:  Patient, Adult Children Patient Interpreter Needed:  None Criminal Activity/Legal Involvement Pertinent to Current Situation/Hospitalization:  No - Comment as needed Significant Relationships:  Adult Children Lives with:  Adult Children Do you feel safe going back to the place where you live?  No Need for family participation in patient care:  Yes (Comment)  Care giving concerns:  Pt resides alone and has new impairment that the clinical team is recommending rehabilitative therapies at a SNF.  Social Worker assessment / plan:  CSW met with patient at bedside along with son-n-law to discuss SNF placement. Pt confirmed that she has experience with SNF and has already investigated SNF placement.  She is interested in [facility] and [place]. CSW explained SNF placement, process, and Insurance Auth needed for placement. Pt gave permission to send to SNF's in [place].  CSW will f/u for PASSR.  Employment status:  Retired Forensic scientist:  Other (Comment Required)([employer]) PT Recommendations:  Not assessed at this time Information / Referral to community resources:  [place]  Patient/Family's Response to care:  Psychologist, prison and probation services of CSW meeting to discuss SNF and agreeable to placement. Patient did not express any issues or concerns.  Patient/Family's Understanding of and  Emotional Response to Diagnosis, Current Treatment, and Prognosis:  Patient/family has good understanding of diagnosis and appear to be in good emotional state with disposition to get rehabilitative therapies at a SNF. Pt hopes to desire to improve and return home to her independence. Pt has good family support and will continue to improve once completed rehabilitative therapies. No issues or concerns identifed.  Emotional Assessment Appearance:  Appears stated age Attitude/Demeanor/Rapport:    Affect (typically observed):  Accepting, Adaptable, Calm Orientation:  Oriented to Self, Oriented to Place, Oriented to  Time, Oriented to Situation Alcohol / Substance use:    Psych involvement (Current and /or in the community):  No (Comment)  Discharge Needs  Concerns to be addressed:  Discharge Planning Concerns, Home Safety Concerns, Care Coordination Readmission within the last 30 days:  Yes Current discharge risk:  Physical Impairment Barriers to Discharge:  Continued Medical Work up   [employer], [place] 07/15/2017, 9:00 AM

## 2017-07-15 NOTE — Discharge Summary (Signed)
Patient ID: Karen Castillo MRN: [id number] DOB/AGE: March 29, 1940 77 y.o.  Admit date: 07/09/2017 Discharge date: 07/11/2017  Admission Diagnoses:  Active Problems:   Radiculopathy   Discharge Diagnoses:  Same  Past Medical History:  Diagnosis Date  . Arthritis   . Bipolar disorder ([place])    patient denies  . Cancer (HCC)    SKIN    ,    LEFT BREAST   . GERD (gastroesophageal reflux disease)   . Hypertension   . Sleep apnea    USES CPAP     Surgeries: Procedure(s): REVISION L5-S1 POSTERIOR SPINAL FUSION WITH INSTRUMENTATION AND ALLOGRAFT  TIME REQUESTED 4.5 HOURS on 07/09/2017   Consultants: None  Discharged Condition: Improved  Hospital Course: Karen Castillo is an 77 y.o. female who was admitted 07/09/2017 for operative treatment of nonunion. Patient has severe unremitting pain that affects sleep, daily activities, and work/hobbies. After pre-op clearance the patient was taken to the operating room on 07/09/2017 and underwent  Procedure(s): REVISION L5-S1 POSTERIOR SPINAL FUSION WITH INSTRUMENTATION AND ALLOGRAFT  TIME REQUESTED 4.5 HOURS.    Patient was given perioperative antibiotics:  Anti-infectives (From admission, onward)   Start     Dose/Rate Route Frequency Ordered Stop   07/09/17 1900  vancomycin (VANCOCIN) 1,250 mg in sodium chloride 0.9 % 250 mL IVPB     1,250 mg 166.7 mL/hr over 90 Minutes Intravenous  Once 07/09/17 1659 07/09/17 2100   07/09/17 0530  vancomycin (VANCOCIN) 1,500 mg in sodium chloride 0.9 % 500 mL IVPB     1,500 mg 250 mL/hr over 120 Minutes Intravenous On call to O.R. 07/08/17 1022 07/09/17 0842       Patient was given sequential compression devices, early ambulation to prevent DVT.  Patient benefited maximally from hospital stay and there were no complications.    Recent vital signs: BP (!) 116/47 (BP Location: Right Arm)   Pulse 72   Temp 98.4 F (36.9 C) (Oral)   Resp 17   Ht 5\' 4"  (1.626 m)   Wt 107.2 kg (236 lb 6.4 oz)   SpO2 91%    BMI 40.58 kg/m    Discharge Medications:   Allergies as of 07/11/2017      Reactions   Ciprofloxacin Swelling   tongue   Pregabalin Anaphylaxis, Shortness Of Breath, Swelling   dizziness   Mirapex [pramipexole] Nausea And Vomiting   Amitriptyline Other (See Comments)   Weird feeling all over   Arthrotec [diclofenac-misoprostol] Diarrhea   Augmentin [amoxicillin-pot Clavulanate] Diarrhea   Has patient had a PCN reaction causing immediate rash, facial/tongue/throat swelling, SOB or lightheadedness with hypotension: No Has patient had a PCN reaction causing severe rash involving mucus membranes or skin necrosis: No Has patient had a PCN reaction that required hospitalization: No Has patient had a PCN reaction occurring within the last 10 years: No If all of the above answers are "NO", then may proceed with Cephalosporin use.   Bromfenac Diarrhea, Nausea Only   Cephalexin Diarrhea, Nausea Only   Conj Estrog-medroxyprogest Ace Other (See Comments)   Unsure of reaction type Couldn't tolerate   Crestor [rosuvastatin] Other (See Comments)   Leg pain   Diclofenac-misoprostol Nausea Only   Erythromycin Diarrhea, Nausea Only   Esomeprazole Magnesium Nausea Only   Flexeril [cyclobenzaprine] Swelling   Feet    Lescol [fluvastatin] Other (See Comments)   Leg pain   Levofloxacin Other (See Comments), Nausea Only   Stomach upset   Metaxalone Rash   Flushing  of the skin Sore throat   Metoclopramide Diarrhea, Nausea Only   Nexium [esomeprazole] Diarrhea   Pravastatin Other (See Comments)   Leg pain   Propoxyphene Diarrhea, Nausea Only   Rofecoxib Diarrhea, Nausea Only   Statins    Leg pain    Sulfur Diarrhea, Nausea Only   Zoloft [sertraline Hcl] Other (See Comments)   Sensitivity with teeth      Medication List    TAKE these medications   ACIDOPHILUS PO Take 1 capsule by mouth daily.   allopurinol 100 MG tablet Commonly known as:  ZYLOPRIM Take 100 mg by mouth daily.     amLODipine 10 MG tablet Commonly known as:  NORVASC Take 10 mg by mouth daily.   aspirin EC 81 MG tablet Take 81 mg by mouth daily.   atenolol 100 MG tablet Commonly known as:  TENORMIN Take 100 mg by mouth at bedtime.   BENEFIBER PO Take 15 g by mouth daily.   cloNIDine 0.1 MG tablet Commonly known as:  CATAPRES Take 0.1 mg by mouth at bedtime.   COSAMIN DS PO Take 2 tablets by mouth daily.   cycloSPORINE 0.05 % ophthalmic emulsion Commonly known as:  RESTASIS Place 1 drop into both eyes 4 (four) times daily.   lansoprazole 30 MG capsule Commonly known as:  PREVACID Take 30 mg by mouth daily before breakfast. 30 minutes before breakfast   latanoprost 0.005 % ophthalmic solution Commonly known as:  XALATAN Place 1 drop into both eyes at bedtime.   losartan-hydrochlorothiazide 100-25 MG tablet Commonly known as:  HYZAAR Take 1 tablet by mouth daily.   Magnesium 70 MG Caps Take 140 mg by mouth every morning. CRAMP DEFENSE   Melatonin 10 MG Tabs Take 10 mg by mouth at bedtime. (2100)   morphine 30 MG 12 hr tablet Commonly known as:  MS CONTIN Take 30 mg by mouth every 12 (twelve) hours.   nortriptyline 10 MG capsule Commonly known as:  PAMELOR Take 10 mg by mouth at bedtime.   polyethylene glycol packet Commonly known as:  MIRALAX / GLYCOLAX Take 17 g by mouth daily.   spironolactone 25 MG tablet Commonly known as:  ALDACTONE Take 25 mg by mouth daily.   timolol 0.5 % ophthalmic solution Commonly known as:  BETIMOL Place 1 drop into both eyes daily.   traZODone 100 MG tablet Commonly known as:  DESYREL Take 200 mg by mouth at bedtime.       Diagnostic Studies: Dg Chest 2 View  Result Date: 07/09/2017 CLINICAL DATA:  Preoperative examination.  No new chest complaints. EXAM: CHEST - 2 VIEW COMPARISON:  08/28/2014 FINDINGS: Shallow inspiration. Heart size and pulmonary vascularity are normal. No airspace disease or consolidation in the lungs. Linear  fibrosis in the lung bases. No change since previous study. No blunting of costophrenic angles. No pneumothorax. Mediastinal contours appear intact. Calcification of the aorta. Postoperative changes with posterior fixation of the lower thoracic and upper lumbar spine. Surgical clips in the right upper quadrant. IMPRESSION: Linear fibrosis in the lung bases similar to previous study. No evidence of active pulmonary disease. Coronary artery calcifications. Electronically Signed   By: Lucienne Capers M.D.   On: 07/09/2017 06:43   Dg Lumbar Spine 2-3 Views  Result Date: 07/09/2017 CLINICAL DATA:  Spinal fusion EXAM: LUMBAR SPINE - 2-3 VIEW; DG C-ARM 61-120 MIN COMPARISON:  Lumbar CT May 26, 2017 FLUOROSCOPY TIME:  2 minutes 8 seconds; 2 acquired images FINDINGS: Frontal and lateral views  were obtained. There is posterior screw and plate fixation at L3, L4, L5, and S1 with screws transfixing both sacroiliac joints. There is anterior screw and plate fixation at L5 and S1. No fracture or spondylolisthesis. There is mild disc space narrowing at L5-S1. IMPRESSION: Extensive postoperative change with new screws transfixing the sacroiliac joint regions. No fracture or spondylolisthesis. Electronically Signed   By: Lowella Grip III M.D.   On: 07/09/2017 14:01   Dg Lumbar Spine 1 View  Result Date: 07/09/2017 CLINICAL DATA:  Pseudoarthrosis of lumbar spine EXAM: LUMBAR SPINE - 1 VIEW COMPARISON:  05/26/2017 myelogram FINDINGS: Single lateral intraprocedural image shows a spinal needle at the level of the sacrum. There could be an additional more superior needle, but this is not certain based on minimal coverage. Extensive spinal fixation hardware as described on comparison myelogram. IMPRESSION: Intraoperative localization. At least 1 needle is seen posteriorly at the level of the sacrum. Electronically Signed   By: Monte Fantasia M.D.   On: 07/09/2017 09:37   Dg C-arm 1-60 Min  Result Date: 07/09/2017 CLINICAL  DATA:  Spinal fusion EXAM: LUMBAR SPINE - 2-3 VIEW; DG C-ARM 61-120 MIN COMPARISON:  Lumbar CT May 26, 2017 FLUOROSCOPY TIME:  2 minutes 8 seconds; 2 acquired images FINDINGS: Frontal and lateral views were obtained. There is posterior screw and plate fixation at L3, L4, L5, and S1 with screws transfixing both sacroiliac joints. There is anterior screw and plate fixation at L5 and S1. No fracture or spondylolisthesis. There is mild disc space narrowing at L5-S1. IMPRESSION: Extensive postoperative change with new screws transfixing the sacroiliac joint regions. No fracture or spondylolisthesis. Electronically Signed   By: Lowella Grip III M.D.   On: 07/09/2017 14:01   Dg C-arm 1-60 Min  Result Date: 07/09/2017 CLINICAL DATA:  Spinal fusion EXAM: LUMBAR SPINE - 2-3 VIEW; DG C-ARM 61-120 MIN COMPARISON:  Lumbar CT May 26, 2017 FLUOROSCOPY TIME:  2 minutes 8 seconds; 2 acquired images FINDINGS: Frontal and lateral views were obtained. There is posterior screw and plate fixation at L3, L4, L5, and S1 with screws transfixing both sacroiliac joints. There is anterior screw and plate fixation at L5 and S1. No fracture or spondylolisthesis. There is mild disc space narrowing at L5-S1. IMPRESSION: Extensive postoperative change with new screws transfixing the sacroiliac joint regions. No fracture or spondylolisthesis. Electronically Signed   By: Lowella Grip III M.D.   On: 07/09/2017 14:01    Disposition: Discharge disposition: 01-Home or Self Care       Discharge Instructions    Discharge patient   Complete by:  As directed    Discharge disposition:  01-Home or Self Care   Discharge patient date:  07/11/2017     POD #2 s/p L5-S1 fusion procedure with extension of instrumentation to her pelvis  - up with PT/OT, encourage ambulation - Percocet for pain, Valium for muscle spasms - Pt has oxycontin from px mngmt  - likely d/c home today with f/u in 2 weeks pending PT/OT  progress   Signed: Justice [place] 07/15/2017, 1:24 PM

## 2017-07-15 NOTE — Progress Notes (Signed)
Physical Therapy Treatment Patient Details Name: Karen Castillo MRN: [id number] DOB: 04/12/1940 Today's Date: 07/15/2017    History of Present Illness Pt is a 77 y/o female who presents s/p L5-S1 revision of posterior spinal fusion on 07/09/17. She went home on 07/11/17 but could not mobilize at home or get pain under control and returned on 07/13/17.  PMH significant for HTN, CA, bipolar disorder, B carpal tunnel release.    PT Comments    Pt agreeable to treatment but perseverates on pain. Pt demonstrated improved ability to transfer as evident by min guard x 2 stand pivot transfer performed bed<>recliner chair. However, OOB tolerance is decreased as PT was called back in room after 10 minutes of pt sitting in chair. Pt returned to bed from recliner chair using STEDY Lift. Current recommendations remain appropriate.   Follow Up Recommendations  SNF;Supervision/Assistance - 24 hour     Equipment Recommendations  None recommended by PT(Defer to SNF)    Recommendations for Other Services       Precautions / Restrictions Precautions Precautions: Fall;Back Precaution Comments: Reviewed post op back precautions Required Braces or Orthoses: Spinal Brace Spinal Brace: Thoracolumbosacral orthotic;Applied in sitting position Restrictions Weight Bearing Restrictions: No    Mobility  Bed Mobility Overal bed mobility: Needs Assistance Bed Mobility: Rolling;Sidelying to Sit;Sit to Sidelying Rolling: +2 for physical assistance;Mod assist(HOB elevated) Sidelying to sit: +2 for physical assistance;Mod assist;HOB elevated     Sit to sidelying: Max assist;+2 for physical assistance General bed mobility comments: Pt complained of pain with each positional change that subsided with rest. Pt rolled L with use of bed rail and Mod A x 2 to achieve L S/L. L S/L>sit EOB with HOB elevated with mod A for BLE and Trunk. Pt cued to use RUE to push into bed to A with transfer. Pt able to scoot hips forward to  edge of bed with cueing. Upon return to bed with STEDY lift, Max A x 2 required secondary to pain  Transfers Overall transfer level: Needs assistance Equipment used: Rolling walker (2 wheeled) Transfers: Sit to/from [facility] Sit to Stand: Min guard;+2 safety/equipment;From elevated surface Stand pivot transfers: Min guard;+2 safety/equipment       General transfer comment: Pt performed sit<>stand with RW min guard. Pt performed stand pivot transfer Min Guard with RW with increased time required but was able to pivot L to recliner chair. After end of PT session, pt requested PT to return to room for transfer back to bed (STEDY used)  Ambulation/Gait                 Stairs             Wheelchair Mobility    Modified Rankin (Stroke Patients Only)       Balance Overall balance assessment: Needs assistance Sitting-balance support: Bilateral upper extremity supported Sitting balance-Leahy Scale: Poor Sitting balance - Comments: Pt stable sitting EOB with BUE support on bed. Pt demonstrated decreased stability when lifting either UE of bed   Standing balance support: Bilateral upper extremity supported Standing balance-Leahy Scale: Poor Standing balance comment: Required support on RW and min guard x 2. Pt able to balance with RW but only tolerated standing for ~30 seconds                            Cognition Arousal/Alertness: Awake/alert Behavior During Therapy: WFL for tasks assessed/performed Overall Cognitive Status: Within Functional Limits for  tasks assessed                                        Exercises      General Comments        Pertinent Vitals/Pain Pain Assessment: 0-10 Pain Score: 10-Worst pain ever(After stand pivot transfer bed<>chair) Pain Location: back Pain Descriptors / Indicators: Constant;Moaning;Guarding Pain Intervention(s): Limited activity within patient's tolerance;Monitored during  session;Patient requesting pain meds-RN notified    Home Living                      Prior Function            PT Goals (current goals can now be found in the care plan section) Acute Rehab PT Goals Patient Stated Goal: to get better and go home PT Goal Formulation: With patient Time For Goal Achievement: 07/28/17 Potential to Achieve Goals: Poor Progress towards PT goals: Progressing toward goals    Frequency    Min 5X/week      PT Plan Current plan remains appropriate    Co-evaluation              AM-PAC PT "6 Clicks" Daily Activity  Outcome Measure  Difficulty turning over in bed (including adjusting bedclothes, sheets and blankets)?: Unable Difficulty moving from lying on back to sitting on the side of the bed? : Unable Difficulty sitting down on and standing up from a chair with arms (e.g., wheelchair, bedside commode, etc,.)?: A Lot Help needed moving to and from a bed to chair (including a wheelchair)?: A Lot Help needed walking in hospital room?: Total Help needed climbing 3-5 steps with a railing? : Total 6 Click Score: 8    End of Session Equipment Utilized During Treatment: Gait belt;Back brace Activity Tolerance: Patient limited by pain Patient left: in bed;in chair;with call bell/phone within reach(Pt left in chair but PT called back in after 10 minutes to use lift to get back to bed) Nurse Communication: Mobility status;Need for lift equipment(STEDY) PT Visit Diagnosis: Unsteadiness on feet (R26.81);Muscle weakness (generalized) (M62.81);Difficulty in walking, not elsewhere classified (R26.2);Pain Pain - part of body: (Back)     Time: 1420-1510 PT Time Calculation (min) (ACUTE ONLY): 50 min  Charges:  $Therapeutic Activity: 38-52 mins                    G Codes:       Gabe Carel Carrier, SPT   [employer] 07/15/2017, 3:32 PM

## 2017-07-15 NOTE — Social Work (Addendum)
CSW met with patient at bedside and she requested CSW contact daughter. CSW contacted daughter and discussed offers. [facility] accepted bed offer from [facility].   CSW then contacted SNF and they confirmed placement.  CSW then called [employer] to initiate Insurance Auth.  CSW contacted Dr. Lynann Bologna and left message requesting call back to discuss 30 day note that needs to be signed that was left on chart. CSW will continue to follow up. Floor RN is providing assistance as well.  2:00pm: CSW received call back from Dr. Lynann Bologna indicating that he will sign 30 day note on chart shortly.  CSW will f/u to obtain PASSR number for placement once forms faxed to [employer].  [employer] started insurance auth as well.  2:50pm: CSW faxed clinicals to [employer] for PASSR for SNF placement. CSW will f/u.  4:06pm PASSR #[id number] E received.   CSW awaiting on Insurance Auth for SNF placement.   Elissa Hefty, LCSW Clinical Social Worker [phone]

## 2017-07-15 NOTE — Progress Notes (Signed)
    Patient doing well  Progressing slowly with PT/OT She only got onto her feet once yesterday Patient has discussed placement with social worker, Mardene Celeste, who is working on a facility   Physical Exam: Vitals:   07/14/17 2306 07/15/17 0405  BP:  (!) 179/52  Pulse: 81 90  Resp: 16   Temp:  97.9 F (36.6 C)  SpO2: 95% 94%    Dressing in place NVI  Pt s/p L5/S1 revision fusion procedure, progressing slowly, requiring placement to rehab  - up with PT/OT, encourage ambulation - patient NEEDS to be on her feet at least twice today, and ideally, 3 times - her catheter needs to be removed later this evening, as she is now 6 days after surgery, which will increase mobility - transfer to rehab once a bed is available

## 2017-07-16 DIAGNOSIS — G4730 Sleep apnea, unspecified: Secondary | ICD-10-CM | POA: Diagnosis not present

## 2017-07-16 DIAGNOSIS — Z9049 Acquired absence of other specified parts of digestive tract: Secondary | ICD-10-CM | POA: Diagnosis not present

## 2017-07-16 DIAGNOSIS — M129 Arthropathy, unspecified: Secondary | ICD-10-CM | POA: Diagnosis not present

## 2017-07-16 DIAGNOSIS — Z7401 Bed confinement status: Secondary | ICD-10-CM | POA: Diagnosis not present

## 2017-07-16 DIAGNOSIS — L500 Allergic urticaria: Secondary | ICD-10-CM | POA: Diagnosis not present

## 2017-07-16 DIAGNOSIS — C439 Malignant melanoma of skin, unspecified: Secondary | ICD-10-CM | POA: Diagnosis not present

## 2017-07-16 DIAGNOSIS — G4733 Obstructive sleep apnea (adult) (pediatric): Secondary | ICD-10-CM | POA: Diagnosis not present

## 2017-07-16 DIAGNOSIS — M5440 Lumbago with sciatica, unspecified side: Secondary | ICD-10-CM | POA: Diagnosis not present

## 2017-07-16 DIAGNOSIS — M5416 Radiculopathy, lumbar region: Secondary | ICD-10-CM | POA: Diagnosis not present

## 2017-07-16 DIAGNOSIS — L509 Urticaria, unspecified: Secondary | ICD-10-CM | POA: Diagnosis not present

## 2017-07-16 DIAGNOSIS — R300 Dysuria: Secondary | ICD-10-CM | POA: Diagnosis not present

## 2017-07-16 DIAGNOSIS — M2550 Pain in unspecified joint: Secondary | ICD-10-CM | POA: Diagnosis not present

## 2017-07-16 DIAGNOSIS — M25551 Pain in right hip: Secondary | ICD-10-CM | POA: Diagnosis not present

## 2017-07-16 DIAGNOSIS — Z888 Allergy status to other drugs, medicaments and biological substances status: Secondary | ICD-10-CM | POA: Diagnosis not present

## 2017-07-16 DIAGNOSIS — Z7982 Long term (current) use of aspirin: Secondary | ICD-10-CM | POA: Diagnosis not present

## 2017-07-16 DIAGNOSIS — E669 Obesity, unspecified: Secondary | ICD-10-CM | POA: Diagnosis not present

## 2017-07-16 DIAGNOSIS — Z6841 Body Mass Index (BMI) 40.0 and over, adult: Secondary | ICD-10-CM | POA: Diagnosis not present

## 2017-07-16 DIAGNOSIS — M4326 Fusion of spine, lumbar region: Secondary | ICD-10-CM | POA: Diagnosis not present

## 2017-07-16 DIAGNOSIS — R220 Localized swelling, mass and lump, head: Secondary | ICD-10-CM | POA: Diagnosis not present

## 2017-07-16 DIAGNOSIS — E876 Hypokalemia: Secondary | ICD-10-CM | POA: Diagnosis not present

## 2017-07-16 DIAGNOSIS — K219 Gastro-esophageal reflux disease without esophagitis: Secondary | ICD-10-CM | POA: Diagnosis not present

## 2017-07-16 DIAGNOSIS — D72829 Elevated white blood cell count, unspecified: Secondary | ICD-10-CM | POA: Diagnosis not present

## 2017-07-16 DIAGNOSIS — I10 Essential (primary) hypertension: Secondary | ICD-10-CM | POA: Diagnosis not present

## 2017-07-16 DIAGNOSIS — M5116 Intervertebral disc disorders with radiculopathy, lumbar region: Secondary | ICD-10-CM | POA: Diagnosis not present

## 2017-07-16 DIAGNOSIS — M6281 Muscle weakness (generalized): Secondary | ICD-10-CM | POA: Diagnosis not present

## 2017-07-16 DIAGNOSIS — R52 Pain, unspecified: Secondary | ICD-10-CM | POA: Diagnosis not present

## 2017-07-16 DIAGNOSIS — D649 Anemia, unspecified: Secondary | ICD-10-CM | POA: Diagnosis not present

## 2017-07-16 DIAGNOSIS — F319 Bipolar disorder, unspecified: Secondary | ICD-10-CM | POA: Diagnosis not present

## 2017-07-16 DIAGNOSIS — G8918 Other acute postprocedural pain: Secondary | ICD-10-CM | POA: Diagnosis not present

## 2017-07-16 DIAGNOSIS — R0902 Hypoxemia: Secondary | ICD-10-CM | POA: Diagnosis not present

## 2017-07-16 DIAGNOSIS — F3130 Bipolar disorder, current episode depressed, mild or moderate severity, unspecified: Secondary | ICD-10-CM | POA: Diagnosis not present

## 2017-07-16 DIAGNOSIS — T7840XA Allergy, unspecified, initial encounter: Secondary | ICD-10-CM | POA: Diagnosis not present

## 2017-07-16 DIAGNOSIS — Z981 Arthrodesis status: Secondary | ICD-10-CM | POA: Diagnosis not present

## 2017-07-16 DIAGNOSIS — Z882 Allergy status to sulfonamides status: Secondary | ICD-10-CM | POA: Diagnosis not present

## 2017-07-16 DIAGNOSIS — C50912 Malignant neoplasm of unspecified site of left female breast: Secondary | ICD-10-CM | POA: Diagnosis not present

## 2017-07-16 DIAGNOSIS — Z881 Allergy status to other antibiotic agents status: Secondary | ICD-10-CM | POA: Diagnosis not present

## 2017-07-16 MED ORDER — FAMOTIDINE 20 MG PO TABS
20.0000 mg | ORAL_TABLET | Freq: Three times a day (TID) | ORAL | Status: DC | PRN
  Administered 2017-07-16: 20 mg via ORAL
  Filled 2017-07-16: qty 1

## 2017-07-16 MED ORDER — MORPHINE SULFATE ER 30 MG PO TBCR: 30.0000 mg | EXTENDED_RELEASE_TABLET | Freq: Two times a day (BID) | ORAL | 0 refills | Status: AC

## 2017-07-16 MED ORDER — DIAZEPAM 5 MG PO TABS: 5.0000 mg | ORAL_TABLET | Freq: Four times a day (QID) | ORAL | 0 refills | Status: DC | PRN

## 2017-07-16 MED ORDER — CALCIUM CARBONATE ANTACID 500 MG PO CHEW
1.0000 | CHEWABLE_TABLET | Freq: Three times a day (TID) | ORAL | Status: DC | PRN
  Administered 2017-07-16: 200 mg via ORAL
  Filled 2017-07-16: qty 1

## 2017-07-16 MED ORDER — OXYCODONE-ACETAMINOPHEN 5-325 MG PO TABS: 1.0000 | ORAL_TABLET | ORAL | 0 refills | Status: DC | PRN

## 2017-07-16 NOTE — Plan of Care (Signed)

## 2017-07-16 NOTE — Progress Notes (Signed)
Report called to [place], nurse at [employer].

## 2017-07-16 NOTE — Discharge Summary (Signed)
Patient ID: Karen Castillo MRN: [id number] DOB/AGE: 1940/03/22 77 y.o.  Admit date: 07/13/2017 Discharge date: 07/16/2017  Admission Diagnoses:  Active Problems:   Acute post-operative pain   Intractable low back pain   Discharge Diagnoses:  Same  Past Medical History:  Diagnosis Date  . Arthritis   . Bipolar disorder ([place])    patient denies  . Cancer (HCC)    SKIN    ,    LEFT BREAST   . GERD (gastroesophageal reflux disease)   . Hypertension   . Sleep apnea    USES CPAP     Surgeries:  S/P L5-S1 Lumbar Fusion 07/09/2017   Consultants: None  Discharged Condition: Improved  Hospital Course: Karen Castillo is an 77 y.o. female who was admitted 07/13/2017 for operative treatment of post op pain S/P lumbar fusion. Patient has severe unremitting pain that affects sleep, daily activities, and work/hobbies. After pre-op clearance the patient was taken to the operating room on * No surgery found * and underwent  .    Patient was given perioperative antibiotics:  Anti-infectives (From admission, onward)   None       Patient was given sequential compression devices, early ambulation to prevent DVT.  Patient benefited maximally from hospital stay and there were no complications.    Recent vital signs:  Patient Vitals for the past 24 hrs:  BP Temp Temp src Pulse Resp SpO2  07/16/17 0504 (!) 159/49 98.4 F (36.9 C) Oral 84 16 93 %  07/15/17 2252 - - - 90 16 93 %  07/15/17 2211 (!) 148/49 98.5 F (36.9 C) Oral 90 16 93 %  07/15/17 1434 (!) 169/63 98.3 F (36.8 C) Oral 85 18 95 %     Discharge Medications:   Allergies as of 07/16/2017      Reactions   Ciprofloxacin Swelling   Tongue swells   Cyclobenzaprine Swelling   Feet swell   Pregabalin Anaphylaxis, Shortness Of Breath, Swelling, Other (See Comments)   Dizziness also   Fluvastatin Other (See Comments)   Leg pain   Meloxicam Swelling   Feet became swollen   Metaxalone Rash, Other (See Comments)   Flushing of  the skin and sore throat, too   Pramipexole Nausea And Vomiting   Pravastatin Other (See Comments)   Leg pain   Rosuvastatin Other (See Comments)   Leg pain   Sertraline Other (See Comments)   Sensitive teeth   Statins Other (See Comments)   Leg pain    Amitriptyline Other (See Comments)   Weird feeling all over   Arthrotec [diclofenac-misoprostol] Diarrhea   Augmentin [amoxicillin-pot Clavulanate] Diarrhea   Has patient had a PCN reaction causing immediate rash, facial/tongue/throat swelling, SOB or lightheadedness with hypotension: No Has patient had a PCN reaction causing severe rash involving mucus membranes or skin necrosis: No Has patient had a PCN reaction that required hospitalization: No Has patient had a PCN reaction occurring within the last 10 years: No If all of the above answers are "NO", then may proceed with Cephalosporin use.   Bromfenac Diarrhea, Nausea Only   Cephalexin Diarrhea, Nausea Only   Conj Estrog-medroxyprogest Ace Other (See Comments)   Unsure of reaction type Couldn't tolerate   Diclofenac-misoprostol Nausea Only, Diarrhea   Erythromycin Diarrhea, Nausea Only   Esomeprazole Diarrhea   Esomeprazole Magnesium Nausea Only   Levofloxacin Other (See Comments), Nausea Only   Stomach upset   Methadone Diarrhea   Metoclopramide Diarrhea, Nausea Only  Propoxyphene Diarrhea, Nausea Only   Rofecoxib Diarrhea, Nausea Only   Sulfur Diarrhea, Nausea Only   Tramadol Diarrhea   Zoloft [sertraline Hcl] Other (See Comments)   Sensitivity with teeth      Medication List    TAKE these medications   ACIDOPHILUS PO Take 1 capsule by mouth daily.   allopurinol 100 MG tablet Commonly known as:  ZYLOPRIM Take 100 mg by mouth daily.   amLODipine 10 MG tablet Commonly known as:  NORVASC Take 10 mg by mouth daily.   aspirin EC 81 MG tablet Take 81 mg by mouth daily.   atenolol 100 MG tablet Commonly known as:  TENORMIN Take 100 mg by mouth at bedtime.     azelastine 0.1 % nasal spray Commonly known as:  ASTELIN Place 1-2 sprays into both nostrils 2 (two) times daily as needed for rhinitis or allergies.   BENEFIBER PO Take 15 g by mouth daily.   CALCIUM+D3 PO Take 1 tablet by mouth 2 (two) times daily.   cloNIDine 0.1 MG tablet Commonly known as:  CATAPRES Take 0.1 mg by mouth at bedtime.   COSAMIN DS PO Take 2 tablets by mouth daily.   cycloSPORINE 0.05 % ophthalmic emulsion Commonly known as:  RESTASIS Place 1 drop into both eyes 4 (four) times daily.   diazepam 5 MG tablet Commonly known as:  VALIUM Take 5 mg by mouth every 6 (six) hours as needed (for spasms).   fexofenadine 180 MG tablet Commonly known as:  ALLEGRA Take 180 mg by mouth at bedtime.   Fish Oil 500 MG Caps Take 500 mg by mouth 2 (two) times daily.   GINKGO BILOBA EXTRACT PO Take 1 tablet by mouth daily.   lansoprazole 30 MG capsule Commonly known as:  PREVACID Take 30 mg by mouth daily before breakfast. 30 minutes before breakfast   latanoprost 0.005 % ophthalmic solution Commonly known as:  XALATAN Place 1 drop into both eyes at bedtime.   losartan-hydrochlorothiazide 100-25 MG tablet Commonly known as:  HYZAAR Take 1 tablet by mouth daily.   Magnesium 70 MG Caps Take 140 mg by mouth every morning. CRAMP DEFENSE   Melatonin 10 MG Tabs Take 10 mg by mouth at bedtime. (2100)   morphine 30 MG 12 hr tablet Commonly known as:  MS CONTIN Take 30 mg by mouth every 12 (twelve) hours.   nortriptyline 10 MG capsule Commonly known as:  PAMELOR Take 10 mg by mouth at bedtime.   oxyCODONE-acetaminophen 5-325 MG tablet Commonly known as:  PERCOCET/ROXICET Take 1-2 tablets by mouth See admin instructions. Take 1-2 tablets by mouth every 4-6 hours as needed for pain   polyethylene glycol packet Commonly known as:  MIRALAX / GLYCOLAX Take 17 g by mouth daily.   promethazine 12.5 MG tablet Commonly known as:  PHENERGAN Take 12.5 mg by mouth 2  (two) times daily as needed for nausea or vomiting.   spironolactone 25 MG tablet Commonly known as:  ALDACTONE Take 25 mg by mouth daily.   timolol 0.5 % ophthalmic solution Commonly known as:  BETIMOL Place 1 drop into both eyes daily.   traZODone 100 MG tablet Commonly known as:  DESYREL Take 200 mg by mouth at bedtime.   Vitamin D3 2000 units Tabs Take 2,000 Units by mouth daily.       Diagnostic Studies: Dg Chest 2 View  Result Date: 07/09/2017 CLINICAL DATA:  Preoperative examination.  No new chest complaints. EXAM: CHEST - 2 VIEW COMPARISON:  08/28/2014 FINDINGS: Shallow inspiration. Heart size and pulmonary vascularity are normal. No airspace disease or consolidation in the lungs. Linear fibrosis in the lung bases. No change since previous study. No blunting of costophrenic angles. No pneumothorax. Mediastinal contours appear intact. Calcification of the aorta. Postoperative changes with posterior fixation of the lower thoracic and upper lumbar spine. Surgical clips in the right upper quadrant. IMPRESSION: Linear fibrosis in the lung bases similar to previous study. No evidence of active pulmonary disease. Coronary artery calcifications. Electronically Signed   By: Lucienne Capers M.D.   On: 07/09/2017 06:43   Dg Lumbar Spine 2-3 Views  Result Date: 07/09/2017 CLINICAL DATA:  Spinal fusion EXAM: LUMBAR SPINE - 2-3 VIEW; DG C-ARM 61-120 MIN COMPARISON:  Lumbar CT May 26, 2017 FLUOROSCOPY TIME:  2 minutes 8 seconds; 2 acquired images FINDINGS: Frontal and lateral views were obtained. There is posterior screw and plate fixation at L3, L4, L5, and S1 with screws transfixing both sacroiliac joints. There is anterior screw and plate fixation at L5 and S1. No fracture or spondylolisthesis. There is mild disc space narrowing at L5-S1. IMPRESSION: Extensive postoperative change with new screws transfixing the sacroiliac joint regions. No fracture or spondylolisthesis. Electronically Signed    By: Lowella Grip III M.D.   On: 07/09/2017 14:01   Dg Lumbar Spine 1 View  Result Date: 07/09/2017 CLINICAL DATA:  Pseudoarthrosis of lumbar spine EXAM: LUMBAR SPINE - 1 VIEW COMPARISON:  05/26/2017 myelogram FINDINGS: Single lateral intraprocedural image shows a spinal needle at the level of the sacrum. There could be an additional more superior needle, but this is not certain based on minimal coverage. Extensive spinal fixation hardware as described on comparison myelogram. IMPRESSION: Intraoperative localization. At least 1 needle is seen posteriorly at the level of the sacrum. Electronically Signed   By: Monte Fantasia M.D.   On: 07/09/2017 09:37   Dg C-arm 1-60 Min  Result Date: 07/09/2017 CLINICAL DATA:  Spinal fusion EXAM: LUMBAR SPINE - 2-3 VIEW; DG C-ARM 61-120 MIN COMPARISON:  Lumbar CT May 26, 2017 FLUOROSCOPY TIME:  2 minutes 8 seconds; 2 acquired images FINDINGS: Frontal and lateral views were obtained. There is posterior screw and plate fixation at L3, L4, L5, and S1 with screws transfixing both sacroiliac joints. There is anterior screw and plate fixation at L5 and S1. No fracture or spondylolisthesis. There is mild disc space narrowing at L5-S1. IMPRESSION: Extensive postoperative change with new screws transfixing the sacroiliac joint regions. No fracture or spondylolisthesis. Electronically Signed   By: Lowella Grip III M.D.   On: 07/09/2017 14:01   Dg C-arm 1-60 Min  Result Date: 07/09/2017 CLINICAL DATA:  Spinal fusion EXAM: LUMBAR SPINE - 2-3 VIEW; DG C-ARM 61-120 MIN COMPARISON:  Lumbar CT May 26, 2017 FLUOROSCOPY TIME:  2 minutes 8 seconds; 2 acquired images FINDINGS: Frontal and lateral views were obtained. There is posterior screw and plate fixation at L3, L4, L5, and S1 with screws transfixing both sacroiliac joints. There is anterior screw and plate fixation at L5 and S1. No fracture or spondylolisthesis. There is mild disc space narrowing at L5-S1. IMPRESSION:  Extensive postoperative change with new screws transfixing the sacroiliac joint regions. No fracture or spondylolisthesis. Electronically Signed   By: Lowella Grip III M.D.   On: 07/09/2017 14:01    Disposition: Discharge disposition: 03-Skilled Nursing Facility       Discharge Instructions    Discharge patient   Complete by:  As directed    Discharge  disposition:  03-Skilled [place]   Discharge patient date:  07/16/2017      Pt s/p L5/S1 revision fusion procedure, progressing slowly, requiring placement to rehab  - up with PT/OT, encourage ambulation - transfer to rehab once bed available - f/u in 1 week  Signed: Justice Britain 07/16/2017, 11:55 AM

## 2017-07-16 NOTE — Progress Notes (Signed)
    Patient doing well  Continues to progress slowly Patient felt slight better yesterday vs the day before   Physical Exam: Vitals:   07/15/17 2252 07/16/17 0504  BP:  (!) 159/49  Pulse: 90 84  Resp: 16 16  Temp:  98.4 F (36.9 C)  SpO2: 93% 93%    Wound in place NVI  Pt s/p lumbar fusion, progressing well, pending transfer  - up with PT/OT, encourage ambulation - transfer to rehab once bed available - f/u in 1 week

## 2017-07-16 NOTE — Clinical Social Work Placement (Signed)
   CLINICAL SOCIAL WORK PLACEMENT  NOTE  Date:  07/16/2017  Patient Details  Name: Karen Castillo MRN: [id number] Date of Birth: 1940-06-03  Clinical Social Work is seeking post-discharge placement for this patient at the [place] level of care (*CSW will initial, date and re-position this form in  chart as items are completed):  Yes   Patient/family provided with [facility] Department's list of facilities offering this level of care within the geographic area requested by the patient (or if unable, by the patient's family).  Yes   Patient/family informed of their freedom to choose among providers that offer the needed level of care, that participate in Medicare, Medicaid or managed care program needed by the patient, have an available bed and are willing to accept the patient.  Yes   Patient/family informed of [facility]'s ownership interest in [facility] and [facility], as well as of the fact that they are under no obligation to receive care at these facilities.  PASRR submitted to EDS on       PASRR number received on 07/15/17     Existing PASRR number confirmed on       FL2 transmitted to all facilities in geographic area requested by pt/family on 07/14/17     FL2 transmitted to all facilities within larger geographic area on       Patient informed that his/her managed care company has contracts with or will negotiate with certain facilities, including the following:        Yes   Patient/family informed of bed offers received.  Patient chooses bed at [facility]     Physician recommends and patient chooses bed at      Patient to be transferred to [facility] on 07/16/17.  Patient to be transferred to facility by [facility]     Patient family notified on 07/16/17 of transfer.  Name of family member notified:  pt responsible for self     PHYSICIAN       Additional Comment:     _______________________________________________ Normajean Baxter, LCSW 07/16/2017, 2:26 PM

## 2017-07-16 NOTE — Social Work (Addendum)
CSW received call from [facility], Nydia Bouton, advising that patient was denied for SNF and that they are requesting a peer to peer with Dr. Coralie Carpen at [phone].  CSW then contacted Dr. Lynann Bologna and advised him of same. CSW provided contact information for Dr.Stephen Amalia Hailey so that a peer to peer can be completed.  CSW will f/u.  11:30am Dr. Lynann Bologna indicated that he completed peer to peer and patient was approved. CSW then contacted [employer] and they approved SNF with [id number].  CSW will f/uf or updated dc summary.  Elissa Hefty, LCSW Clinical Social Worker [phone]

## 2017-07-16 NOTE — Progress Notes (Signed)
Patient does not have hard copies of narcotic scripts on chart and according to social worker the patient cannot leave to go to facility without them. Paged [facility] office and patient's MD/PA are in surgery. The office paged them and an OR nurse called me back stating the PA could get the hard scripts to the patient's chart before/by her [facility] pick-up time of 1800.

## 2017-07-16 NOTE — Social Work (Signed)
Clinical Social Worker facilitated patient discharge including contacting patient family and facility to confirm patient discharge plans.  Clinical information faxed to facility and family agreeable with plan.    CSW arranged ambulance transport via [facility] to [facility].    RN to call [phone] to give report prior to discharge. Pt going to room 107.  Clinical Social Worker will sign off for now as social work intervention is no longer needed. Please consult [place] again if new need arises.  Elissa Hefty, LCSW Clinical Social Worker [phone]

## 2017-07-17 DIAGNOSIS — F3130 Bipolar disorder, current episode depressed, mild or moderate severity, unspecified: Secondary | ICD-10-CM | POA: Diagnosis not present

## 2017-07-17 DIAGNOSIS — I10 Essential (primary) hypertension: Secondary | ICD-10-CM | POA: Diagnosis not present

## 2017-07-17 DIAGNOSIS — M5440 Lumbago with sciatica, unspecified side: Secondary | ICD-10-CM | POA: Diagnosis not present

## 2017-07-17 DIAGNOSIS — G4733 Obstructive sleep apnea (adult) (pediatric): Secondary | ICD-10-CM | POA: Diagnosis not present

## 2017-07-19 ENCOUNTER — Other Ambulatory Visit: Payer: Self-pay

## 2017-07-19 ENCOUNTER — Inpatient Hospital Stay (HOSPITAL_COMMUNITY)
Admission: EM | Admit: 2017-07-19 | Discharge: 2017-07-22 | DRG: 607 | Disposition: A | Discharge status: Skilled Nursing Facility | Payer: PPO | Attending: Internal Medicine | Admitting: Internal Medicine

## 2017-07-19 ENCOUNTER — Encounter (HOSPITAL_COMMUNITY): Payer: Self-pay

## 2017-07-19 DIAGNOSIS — F319 Bipolar disorder, unspecified: Secondary | ICD-10-CM | POA: Diagnosis not present

## 2017-07-19 DIAGNOSIS — Z881 Allergy status to other antibiotic agents status: Secondary | ICD-10-CM

## 2017-07-19 DIAGNOSIS — M6281 Muscle weakness (generalized): Secondary | ICD-10-CM | POA: Diagnosis not present

## 2017-07-19 DIAGNOSIS — Z882 Allergy status to sulfonamides status: Secondary | ICD-10-CM

## 2017-07-19 DIAGNOSIS — M129 Arthropathy, unspecified: Secondary | ICD-10-CM | POA: Diagnosis not present

## 2017-07-19 DIAGNOSIS — R300 Dysuria: Secondary | ICD-10-CM | POA: Diagnosis not present

## 2017-07-19 DIAGNOSIS — G8918 Other acute postprocedural pain: Secondary | ICD-10-CM | POA: Diagnosis not present

## 2017-07-19 DIAGNOSIS — Z9049 Acquired absence of other specified parts of digestive tract: Secondary | ICD-10-CM | POA: Diagnosis not present

## 2017-07-19 DIAGNOSIS — C439 Malignant melanoma of skin, unspecified: Secondary | ICD-10-CM | POA: Diagnosis not present

## 2017-07-19 DIAGNOSIS — G4733 Obstructive sleep apnea (adult) (pediatric): Secondary | ICD-10-CM | POA: Diagnosis present

## 2017-07-19 DIAGNOSIS — M5416 Radiculopathy, lumbar region: Secondary | ICD-10-CM | POA: Diagnosis not present

## 2017-07-19 DIAGNOSIS — L509 Urticaria, unspecified: Secondary | ICD-10-CM | POA: Diagnosis not present

## 2017-07-19 DIAGNOSIS — M2550 Pain in unspecified joint: Secondary | ICD-10-CM | POA: Diagnosis not present

## 2017-07-19 DIAGNOSIS — R220 Localized swelling, mass and lump, head: Secondary | ICD-10-CM | POA: Diagnosis not present

## 2017-07-19 DIAGNOSIS — M1611 Unilateral primary osteoarthritis, right hip: Secondary | ICD-10-CM | POA: Diagnosis not present

## 2017-07-19 DIAGNOSIS — Z981 Arthrodesis status: Secondary | ICD-10-CM | POA: Diagnosis not present

## 2017-07-19 DIAGNOSIS — T7840XA Allergy, unspecified, initial encounter: Secondary | ICD-10-CM | POA: Diagnosis not present

## 2017-07-19 DIAGNOSIS — D72829 Elevated white blood cell count, unspecified: Secondary | ICD-10-CM | POA: Diagnosis present

## 2017-07-19 DIAGNOSIS — Z888 Allergy status to other drugs, medicaments and biological substances status: Secondary | ICD-10-CM

## 2017-07-19 DIAGNOSIS — E876 Hypokalemia: Secondary | ICD-10-CM | POA: Diagnosis present

## 2017-07-19 DIAGNOSIS — R52 Pain, unspecified: Secondary | ICD-10-CM | POA: Diagnosis not present

## 2017-07-19 DIAGNOSIS — I10 Essential (primary) hypertension: Secondary | ICD-10-CM | POA: Diagnosis present

## 2017-07-19 DIAGNOSIS — D649 Anemia, unspecified: Secondary | ICD-10-CM | POA: Diagnosis not present

## 2017-07-19 DIAGNOSIS — M25551 Pain in right hip: Secondary | ICD-10-CM | POA: Diagnosis not present

## 2017-07-19 DIAGNOSIS — Z6841 Body Mass Index (BMI) 40.0 and over, adult: Secondary | ICD-10-CM

## 2017-07-19 DIAGNOSIS — K219 Gastro-esophageal reflux disease without esophagitis: Secondary | ICD-10-CM | POA: Diagnosis not present

## 2017-07-19 DIAGNOSIS — Z7401 Bed confinement status: Secondary | ICD-10-CM | POA: Diagnosis not present

## 2017-07-19 DIAGNOSIS — Z7982 Long term (current) use of aspirin: Secondary | ICD-10-CM | POA: Diagnosis not present

## 2017-07-19 DIAGNOSIS — E669 Obesity, unspecified: Secondary | ICD-10-CM | POA: Diagnosis not present

## 2017-07-19 DIAGNOSIS — M4326 Fusion of spine, lumbar region: Secondary | ICD-10-CM | POA: Diagnosis not present

## 2017-07-19 DIAGNOSIS — L500 Allergic urticaria: Secondary | ICD-10-CM | POA: Diagnosis not present

## 2017-07-19 DIAGNOSIS — M5116 Intervertebral disc disorders with radiculopathy, lumbar region: Secondary | ICD-10-CM | POA: Diagnosis not present

## 2017-07-19 DIAGNOSIS — C50912 Malignant neoplasm of unspecified site of left female breast: Secondary | ICD-10-CM | POA: Diagnosis not present

## 2017-07-19 DIAGNOSIS — R0902 Hypoxemia: Secondary | ICD-10-CM | POA: Diagnosis not present

## 2017-07-19 LAB — CBC WITH DIFFERENTIAL/PLATELET
Basophils Absolute: 0 10*3/uL (ref 0.0–0.1)
Basophils Relative: 0 %
Eosinophils Absolute: 0.3 10*3/uL (ref 0.0–0.7)
Eosinophils Relative: 2 %
HEMATOCRIT: 33.8 % — AB (ref 36.0–46.0)
HEMOGLOBIN: 11.1 g/dL — AB (ref 12.0–15.0)
LYMPHS ABS: 2.9 10*3/uL (ref 0.7–4.0)
Lymphocytes Relative: 21 %
MCH: 29.1 pg (ref 26.0–34.0)
MCHC: 32.8 g/dL (ref 30.0–36.0)
MCV: 88.7 fL (ref 78.0–100.0)
MONOS PCT: 4 %
Monocytes Absolute: 0.5 10*3/uL (ref 0.1–1.0)
Neutro Abs: 10 10*3/uL — ABNORMAL HIGH (ref 1.7–7.7)
Neutrophils Relative %: 73 %
Platelets: 493 10*3/uL — ABNORMAL HIGH (ref 150–400)
RBC: 3.81 MIL/uL — ABNORMAL LOW (ref 3.87–5.11)
RDW: 13.5 % (ref 11.5–15.5)
WBC: 13.8 10*3/uL — ABNORMAL HIGH (ref 4.0–10.5)

## 2017-07-19 LAB — BASIC METABOLIC PANEL
Anion gap: 14 (ref 5–15)
BUN: 16 mg/dL (ref 6–20)
CHLORIDE: 96 mmol/L — AB (ref 101–111)
CO2: 25 mmol/L (ref 22–32)
CREATININE: 0.96 mg/dL (ref 0.44–1.00)
Calcium: 9.2 mg/dL (ref 8.9–10.3)
GFR calc Af Amer: >60 mL/min (ref >60)
GFR calc non Af Amer: 56 mL/min — ABNORMAL LOW (ref >60)
Glucose, Bld: 125 mg/dL — ABNORMAL HIGH (ref 65–99)
Potassium: 3.3 mmol/L — ABNORMAL LOW (ref 3.5–5.1)
Sodium: 135 mmol/L (ref 135–145)

## 2017-07-19 MED ORDER — DIPHENHYDRAMINE HCL 50 MG/ML IJ SOLN
25.0000 mg | Freq: Once | INTRAMUSCULAR | Status: AC
Start: 2017-07-19 — End: 2017-07-19
  Administered 2017-07-19: 25 mg via INTRAVENOUS
  Filled 2017-07-19: qty 1

## 2017-07-19 MED ORDER — METHYLPREDNISOLONE SODIUM SUCC 125 MG IJ SOLR
60.0000 mg | Freq: Four times a day (QID) | INTRAMUSCULAR | Status: DC
  Administered 2017-07-19: 60 mg via INTRAVENOUS
  Administered 2017-07-20: 62.5 mg via INTRAVENOUS
  Administered 2017-07-20: 60 mg via INTRAVENOUS
  Filled 2017-07-19 (×3): qty 2

## 2017-07-19 MED ORDER — TRAZODONE HCL 50 MG PO TABS
200.0000 mg | ORAL_TABLET | Freq: Every day | ORAL | Status: DC
  Administered 2017-07-19 – 2017-07-21 (×3): 200 mg via ORAL
  Filled 2017-07-19 (×3): qty 4

## 2017-07-19 MED ORDER — DIAZEPAM 5 MG PO TABS
5.0000 mg | ORAL_TABLET | Freq: Four times a day (QID) | ORAL | Status: DC | PRN
  Administered 2017-07-21: 5 mg via ORAL
  Filled 2017-07-19: qty 1

## 2017-07-19 MED ORDER — PREDNISONE 20 MG PO TABS
60.0000 mg | ORAL_TABLET | Freq: Once | ORAL | Status: AC
  Administered 2017-07-19: 60 mg via ORAL
  Filled 2017-07-19: qty 3

## 2017-07-19 MED ORDER — POTASSIUM CHLORIDE CRYS ER 20 MEQ PO TBCR
40.0000 meq | EXTENDED_RELEASE_TABLET | Freq: Once | ORAL | Status: AC
  Administered 2017-07-19: 40 meq via ORAL
  Filled 2017-07-19: qty 2

## 2017-07-19 MED ORDER — HYDROXYZINE HCL 25 MG PO TABS
25.0000 mg | ORAL_TABLET | Freq: Once | ORAL | Status: AC
  Administered 2017-07-19: 25 mg via ORAL
  Filled 2017-07-19: qty 1

## 2017-07-19 MED ORDER — LATANOPROST 0.005 % OP SOLN
1.0000 [drp] | Freq: Every day | OPHTHALMIC | Status: DC
  Administered 2017-07-19 – 2017-07-21 (×3): 1 [drp] via OPHTHALMIC
  Filled 2017-07-19: qty 2.5

## 2017-07-19 MED ORDER — ENOXAPARIN SODIUM 40 MG/0.4ML ~~LOC~~ SOLN
40.0000 mg | SUBCUTANEOUS | Status: DC
  Administered 2017-07-19 – 2017-07-21 (×3): 40 mg via SUBCUTANEOUS
  Filled 2017-07-19 (×3): qty 0.4

## 2017-07-19 MED ORDER — TIMOLOL HEMIHYDRATE 0.5 % OP SOLN
1.0000 [drp] | Freq: Every day | OPHTHALMIC | Status: DC
  Administered 2017-07-20 – 2017-07-22 (×3): 1 [drp] via OPHTHALMIC
  Filled 2017-07-19: qty 10

## 2017-07-19 MED ORDER — MORPHINE SULFATE (PF) 2 MG/ML IV SOLN
1.0000 mg | INTRAVENOUS | Status: DC | PRN
  Administered 2017-07-20: 2 mg via INTRAVENOUS
  Filled 2017-07-19: qty 1

## 2017-07-19 MED ORDER — DIPHENHYDRAMINE HCL 50 MG/ML IJ SOLN
12.5000 mg | Freq: Four times a day (QID) | INTRAMUSCULAR | Status: DC | PRN
  Administered 2017-07-22: 12.5 mg via INTRAVENOUS
  Filled 2017-07-19: qty 1

## 2017-07-19 MED ORDER — MELATONIN 5 MG PO TABS
10.0000 mg | ORAL_TABLET | Freq: Every day | ORAL | Status: DC
  Administered 2017-07-19 – 2017-07-21 (×3): 10 mg via ORAL
  Filled 2017-07-19 (×4): qty 2

## 2017-07-19 MED ORDER — ONDANSETRON HCL 4 MG/2ML IJ SOLN: 4.0000 mg | Freq: Four times a day (QID) | INTRAMUSCULAR | Status: DC | PRN

## 2017-07-19 MED ORDER — CYCLOSPORINE 0.05 % OP EMUL
1.0000 [drp] | Freq: Four times a day (QID) | OPHTHALMIC | Status: DC
  Administered 2017-07-19 – 2017-07-22 (×10): 1 [drp] via OPHTHALMIC
  Filled 2017-07-19 (×13): qty 1

## 2017-07-19 MED ORDER — MORPHINE SULFATE ER 30 MG PO TBCR
30.0000 mg | EXTENDED_RELEASE_TABLET | Freq: Two times a day (BID) | ORAL | Status: DC
  Administered 2017-07-19 – 2017-07-22 (×6): 30 mg via ORAL
  Filled 2017-07-19 (×6): qty 1

## 2017-07-19 MED ORDER — CLONIDINE HCL 0.1 MG PO TABS
0.1000 mg | ORAL_TABLET | Freq: Every day | ORAL | Status: DC
  Administered 2017-07-19 – 2017-07-21 (×3): 0.1 mg via ORAL
  Filled 2017-07-19 (×3): qty 1

## 2017-07-19 MED ORDER — FAMOTIDINE IN NACL 20-0.9 MG/50ML-% IV SOLN
20.0000 mg | Freq: Once | INTRAVENOUS | Status: AC
Start: 2017-07-19 — End: 2017-07-19
  Administered 2017-07-19: 20 mg via INTRAVENOUS
  Filled 2017-07-19: qty 50

## 2017-07-19 MED ORDER — OXYCODONE-ACETAMINOPHEN 5-325 MG PO TABS
1.0000 | ORAL_TABLET | Freq: Once | ORAL | Status: AC
  Administered 2017-07-19: 1 via ORAL
  Filled 2017-07-19: qty 1

## 2017-07-19 NOTE — Progress Notes (Signed)
Pt. set up per order with CPAP, own hose and nasal mask, humidifier filled with S/W, pt. on room air and able to place on independently also auto start mode on per home regimen, made aware to notify if needed.

## 2017-07-19 NOTE — ED Provider Notes (Signed)
[facility] DEPT Provider Note   CSN: [id number] Arrival date & time: 07/19/17  1254     History   Chief Complaint Chief Complaint  Patient presents with  . Allergic Reaction    HPI Karen Castillo is a 77 y.o. female with a history of chronic back pain s/p L5/S1 revision fusion procedure on 5/2 who presents to the emergency department from [facility] with a chief complaint of allergic reaction.  The patient complains of constant, worsening edema to the lower lip, left eye, cheek, and tongue swelling, and diffuse, pruritic urticaria that began at approximately 8 AM when she awoke.  The patient states the urticaria was first noticed on her bilateral arms.  She was given a bath by staff, and after the bath, noticed that the urticaria had spread to her bilateral legs.   She denies respiratory distress, difficulty breathing, feeling as if her throat is closing, cough, CP, N/V/D, dizziness, lightheadedness, or HA.  She was given 50 mg of Benadryl at 9 AM and a second dose at noon.  EMS reports the patient was given 0.3 mg of epinephrine in route.  The patient reports improvement in the lip and facial swelling after she was given epinephrine.  She reports a history of a similar reaction to pregabalin and ciprofloxacin.  She has been in the rehab facility for the last 3 days.  They have been using a new soap and lotion.  She reports yesterday that she was treated with DermaCloud to prevent a rash near her groin.  This was the first time the cream is been used.  She does not think it was used after the bath this morning.  New medications include Percocet, which she has been taken since her back surgery.  No new foods yesterday.   The history is provided by the patient and the EMS personnel. No language interpreter was used.  Allergic Reaction  Presenting symptoms: rash     Past Medical History:  Diagnosis Date  . Arthritis   . Bipolar disorder  ([place])    patient denies  . Cancer (HCC)    SKIN    ,    LEFT BREAST   . GERD (gastroesophageal reflux disease)   . Hypertension   . Sleep apnea    USES CPAP     Patient Active Problem List   Diagnosis Date Noted  . Urticaria 07/19/2017  . Acute post-operative pain 07/13/2017  . Intractable low back pain 07/13/2017  . Radiculopathy 05/04/2014    Past Surgical History:  Procedure Laterality Date  . ABDOMINAL EXPOSURE N/A 05/04/2014   Procedure: ABDOMINAL EXPOSURE;  Surgeon: Serafina Mitchell, MD;  Location: [facility];  Service: Vascular;  Laterality: N/A;  . ANTERIOR LUMBAR FUSION N/A 05/04/2014   Procedure: ANTERIOR LUMBAR FUSION 1 LEVEL;  Surgeon: Sinclair Ship, MD;  Location: [facility];  Service: Orthopedics;  Laterality: N/A;  Lumbar 5-sacrum 1 anterior lumbar interbody fusion with instrumentation and allograft  . BACK SURGERY     2010 ,2015LUMB FUSION, 06/2013 [facility] FUSION  . BREAST SURGERY     LEFT BRREAST BX  . CARPAL TUNNEL RELEASE     1994 RT  . CARPAL TUNNEL RELEASE Bilateral   . CHOLECYSTECTOMY     2004  . COLONOSCOPY W/ BIOPSIES    . ERCP     2009  . LEEP     [id number]  . SKIN CANCER EXCISION     X 4   .  TUBAL LIGATION       OB History   None      Home Medications    Prior to Admission medications   Medication Sig Start Date End Date Taking? Authorizing Provider  allopurinol (ZYLOPRIM) 100 MG tablet Take 100 mg by mouth daily.   Yes [provider]  amLODipine (NORVASC) 10 MG tablet Take 10 mg by mouth daily.   Yes [provider]  aspirin EC 81 MG tablet Take 81 mg by mouth daily.   Yes [provider]  atenolol (TENORMIN) 100 MG tablet Take 100 mg by mouth at bedtime.    Yes [provider]  Calcium Carb-Cholecalciferol (CALCIUM+D3 PO) Take 1 tablet by mouth daily.    Yes [provider]  Cholecalciferol (VITAMIN D3) 2000 units TABS Take 2,000 Units by mouth daily.   Yes [provider]  cloNIDine  (CATAPRES) 0.1 MG tablet Take 0.1 mg by mouth at bedtime.   Yes [provider]  cycloSPORINE (RESTASIS) 0.05 % ophthalmic emulsion Place 1 drop into both eyes 4 (four) times daily.   Yes [provider]  fexofenadine (ALLEGRA) 180 MG tablet Take 180 mg by mouth at bedtime.   Yes [provider]  GINKGO BILOBA EXTRACT PO Take 1 tablet by mouth daily.    Yes [provider]  Glucosamine-Chondroitin (COSAMIN DS PO) Take 2 tablets by mouth daily.   Yes [provider]  Lactobacillus (ACIDOPHILUS PO) Take 1 capsule by mouth daily.   Yes [provider]  lansoprazole (PREVACID) 30 MG capsule Take 30 mg by mouth daily before breakfast. 30 minutes before breakfast   Yes [provider]  latanoprost (XALATAN) 0.005 % ophthalmic solution Place 1 drop into both eyes at bedtime.   Yes [provider]  losartan-hydrochlorothiazide (HYZAAR) 100-25 MG tablet Take 1 tablet by mouth daily.   Yes [provider]  Magnesium 70 MG CAPS Take 140 mg by mouth every morning. CRAMP DEFENSE    Yes [provider]  Melatonin 10 MG TABS Take 10 mg by mouth at bedtime. (2100)   Yes [provider]  morphine (MS CONTIN) 30 MG 12 hr tablet Take 1 tablet (30 mg total) by mouth every 12 (twelve) hours for 14 days. 07/16/17 07/30/17 Yes Phylliss Bob, MD  nortriptyline (PAMELOR) 10 MG capsule Take 10 mg by mouth at bedtime.    Yes [provider]  Omega-3 Fatty Acids (FISH OIL) 500 MG CAPS Take 500 mg by mouth 2 (two) times daily.   Yes [provider]  polyethylene glycol (MIRALAX / GLYCOLAX) packet Take 17 g by mouth daily.    Yes [provider]  spironolactone (ALDACTONE) 25 MG tablet Take 25 mg by mouth daily.   Yes [provider]  timolol (BETIMOL) 0.5 % ophthalmic solution Place 1 drop into both eyes daily.   Yes [provider]  traZODone (DESYREL) 100 MG tablet Take 200 mg by mouth  at bedtime.    Yes [provider]  Wheat Dextrin (BENEFIBER PO) Take 15 g by mouth daily.    Yes [provider]  azelastine (ASTELIN) 0.1 % nasal spray Place 1-2 sprays into both nostrils 2 (two) times daily as needed for rhinitis or allergies.     [provider]  diazepam (VALIUM) 5 MG tablet Take 1 tablet (5 mg total) by mouth every 6 (six) hours as needed for muscle spasms (for spasms). 07/16/17   Phylliss Bob, MD  oxyCODONE-acetaminophen (PERCOCET/ROXICET)  5-325 MG tablet Take 1-2 tablets by mouth every 4 (four) hours as needed for severe pain. Take 1-2 tablets by mouth every 4-6 hours as needed for pain 07/16/17   Phylliss Bob, MD  promethazine (PHENERGAN) 12.5 MG tablet Take 12.5 mg by mouth 2 (two) times daily as needed for nausea or vomiting.  06/22/17   [provider]    Family History History reviewed. No pertinent family history.  Social History Social History   Tobacco Use  . Smoking status: Never Smoker  . Smokeless tobacco: Never Used  Substance Use Topics  . Alcohol use: No  . Drug use: No     Allergies   Ciprofloxacin; Cyclobenzaprine; Pregabalin; Fluvastatin; Meloxicam; Metaxalone; Pramipexole; Pravastatin; Rosuvastatin; Sertraline; Statins; Amitriptyline; Arthrotec [diclofenac-misoprostol]; Augmentin [amoxicillin-pot clavulanate]; Bromfenac; Cephalexin; Conj estrog-medroxyprogest ace; Diclofenac-misoprostol; Erythromycin; Esomeprazole; Esomeprazole magnesium; Levofloxacin; Methadone; Metoclopramide; Propoxyphene; Rofecoxib; Sulfur; Tramadol; and Zoloft [sertraline hcl]   Review of Systems Review of Systems  Constitutional: Negative for activity change, chills and fever.  HENT: Positive for facial swelling.        Tongue swelling  Respiratory: Negative for choking, chest tightness and shortness of breath.   Cardiovascular: Negative for chest pain and leg swelling.  Gastrointestinal: Negative for abdominal pain, diarrhea, nausea  and vomiting.  Genitourinary: Negative for dysuria.  Musculoskeletal: Negative for back pain.  Skin: Positive for rash.  Allergic/Immunologic: Negative for immunocompromised state.  Neurological: Negative for dizziness, syncope, weakness and headaches.  Psychiatric/Behavioral: Negative for confusion.   Physical Exam Updated Vital Signs BP (!) 164/50   Pulse 72   Temp 98.1 F (36.7 C) (Oral)   Resp 18   Ht 5\' 4"  (1.626 m)   Wt 107 kg (236 lb)   SpO2 99%   BMI 40.51 kg/m   Physical Exam  Constitutional: No distress.  HENT:  Head: Normocephalic and atraumatic.  Mouth/Throat: Uvula is midline, oropharynx is clear and moist and mucous membranes are normal. No uvula swelling. No oropharyngeal exudate, posterior oropharyngeal edema, posterior oropharyngeal erythema or tonsillar abscesses. No tonsillar exudate.  Inferior lip is moderately edematous.  No swelling of the tongue.  Posterior oropharynx is clear.  Mild edema is noted around the left periorbital area, but otherwise no facial swelling.  Sublingual area is soft without edema or induration.  Posterior oropharynx is clear.  Uvula is midline without edema.  Eyes: Pupils are equal, round, and reactive to light. Conjunctivae and EOM are normal. Right eye exhibits no discharge. Left eye exhibits no discharge.  Neck: Normal range of motion. Neck supple. No JVD present. No tracheal deviation present. No thyromegaly present.  Cardiovascular: Normal rate, regular rhythm, normal heart sounds and intact distal pulses. Exam reveals no gallop and no friction rub.  No murmur heard. Pulmonary/Chest: Effort normal. No stridor. No respiratory distress. She has no wheezes. She has no rales.  Peaks in complete, fluent sentences.  No labored breathing or tachypnea.  Abdominal: Soft. She exhibits no distension and no mass. There is no tenderness. There is no rebound and no guarding. No hernia.  Musculoskeletal: Normal range of motion. She exhibits  no edema, tenderness or deformity.  Lymphadenopathy:    She has no cervical adenopathy.  Neurological: She is alert.  Skin: Skin is warm. Rash noted.  Confluent urticaria noted in the bilateral inguinal folds that extends to the proximal bilateral thighs both anteriorly and posteriorly.  Scattered urticarial wheals noted to the bilateral arms, legs, abdomen, and back.  The face is spared.   Psychiatric: Her  behavior is normal.  Nursing note and vitals reviewed.    ED Treatments / Results  Labs (all labs ordered are listed, but only abnormal results are displayed) Labs Reviewed  BASIC METABOLIC PANEL - Abnormal; Notable for the following components:      Result Value   Potassium 3.3 (*)    Chloride 96 (*)    Glucose, Bld 125 (*)    GFR calc non Af Amer 56 (*)    All other components within normal limits  CBC WITH DIFFERENTIAL/PLATELET - Abnormal; Notable for the following components:   WBC 13.8 (*)    RBC 3.81 (*)    Hemoglobin 11.1 (*)    HCT 33.8 (*)    Platelets 493 (*)    Neutro Abs 10.0 (*)    All other components within normal limits  URINALYSIS, ROUTINE W REFLEX MICROSCOPIC  BASIC METABOLIC PANEL  CBC    EKG None  Radiology No results found.  Procedures Procedures (including critical care time)  Medications Ordered in ED Medications  ondansetron (ZOFRAN) injection 4 mg (has no administration in time range)  morphine 2 MG/ML injection 1-2 mg (has no administration in time range)  methylPREDNISolone sodium succinate (SOLU-MEDROL) 125 mg/2 mL injection 60 mg (has no administration in time range)  diphenhydrAMINE (BENADRYL) injection 12.5 mg (has no administration in time range)  famotidine (PEPCID) IVPB 20 mg premix (0 mg Intravenous Stopped 07/19/17 1345)  diphenhydrAMINE (BENADRYL) injection 25 mg (25 mg Intravenous Given 07/19/17 1345)  hydrOXYzine (ATARAX/VISTARIL) tablet 25 mg (25 mg Oral Given 07/19/17 1552)  oxyCODONE-acetaminophen (PERCOCET/ROXICET)  5-325 MG per tablet 1 tablet (1 tablet Oral Given 07/19/17 1552)  predniSONE (DELTASONE) tablet 60 mg (60 mg Oral Given 07/19/17 1635)  potassium chloride SA (K-DUR,KLOR-CON) CR tablet 40 mEq (40 mEq Oral Given 07/19/17 1824)     Initial Impression / Assessment and Plan / ED Course  I have reviewed the triage vital signs and the nursing notes.  Pertinent labs & imaging results that were available during my care of the patient were reviewed by me and considered in my medical decision making (see chart for details).  Clinical Course as of Jul 19 1949  Sun Jul 19, 2017  1705 Patient recheck.  Edema of the lower lip has resolved.  However, the patient has new warm, erythematous plaques to the bilateral upper arms.   [MM]    Clinical Course User Index [MM] Jeneen Doutt, Laymond Purser, PA-C   77 year old female with a history of chronic back pain s/p L5/S1 revision fusion procedure on 5/2 who presents to the emergency department from [facility] with a chief complaint of allergic reaction.  The patient has been in [facility] re[facility] for 3 days and has had many new environmental exposures including a new soap, lotion, and yesterday had a cream for diaper rash called Dermacloud applied to the groin.  She is not taking any antibiotics and her only new medications since her surgery have been Robaxin and Percocet.  She does endorse a previous allergic reaction to ciprofloxacin and pregabalin.  She is hemodynamically stable with no hypotension or tachypnea. She was given 100 mg of Benadryl and 0.3 mg of epinephrine prior to arrival.  She reports improvement of lip swelling and left periorbital edema following epinephrine administration.  Labs performed in the ED are reassuring.  Mild hypokalemia at 3.3.  Hemoglobin is up trending since surgery on May 2.  She was administered 25 mg of Benadryl, 60 mg of  prednisone, 25 mg of Vistaril, and 20 mg of Pepcid.  She was also given her home dose of Percocet.   On reevaluation's, the patient had worsening urticaria to her bilateral arms.  No evidence of angioedema or anaphylaxis on exam, but the patient does have worsening urticaria.  Doubt TENS or SJS.  The patient was seen and evaluated by Dr. Thomasene Lot and after shift change Dr. Leonette Monarch who were in agreement with the work-up and plan.  Spoke with Dr. Karleen Hampshire, hospitalist, who will admit the patient. The patient appears reasonably stabilized for admission considering the current resources, flow, and capabilities available in the ED at this time, and I doubt any other [facility] requiring further screening and/or treatment in the ED prior to admission.  Final Clinical Impressions(s) / ED Diagnoses   Final diagnoses:  Allergic reaction, initial encounter    ED Discharge Orders    None       Joanne Gavel, PA-C 07/19/17 1951    [place]ack[place], Fredia Sorrow, MD 07/22/17 1515

## 2017-07-19 NOTE — ED Triage Notes (Signed)
EMS reports in [facility] rehab since Thursday from back surgery 6 days ago. Allergic reaction after waking up, lip swelling, hives and cheek, staff at facility gave 50mg  Benadryl at 0900, second dose at 1200. EMS gave Epi 0.3mg  enroute with improvement. Pt currently in no distress, mild swelling of bottom lip.

## 2017-07-19 NOTE — ED Notes (Signed)
Bed: WA09 Expected date: 07/19/17 Expected time: 12:43 PM Means of arrival: Ambulance Comments: Allergic reaction has been given Benadryl and Epi

## 2017-07-19 NOTE — H&P (Signed)
History and Physical    Karen Castillo DJS:[id number] DOB: 08/23/1940 DOA: 07/19/2017  PCP: Charletta Cousin, MD (Inactive)  Patient coming from: [facility].   I have personally briefly reviewed patient's old medical records in [place]  Chief Complaint: sent from NH for allergy reaction.   HPI: Karen Castillo is a 77 y.o. female with medical history significant of chronic back pain, s/p L5 S1 fusion procedure, hypertension, GERD, OSA s/p CPAP, Bipolar disorder, was discharged to rehab post procedure. Pt reports allergic reaction starting with lip swelling, left side of the face, tongue swelling, and  erythematous wheels of urticaria on the upper extremities associated  With itching, which later progressed to the lower extremities after arrival to ED, despite epinephrine, prednisone and benadryl given in ED. She denies any sob, cough, nausea, vomiting, abdominal pain, fever and chills, palpitations, dizziness and headache.  She reports dysuria today.  She also reports using a new soap and new lotion derma cloud in the last 2 days.  She was referred to medical service for admission.   ED work up showed bmp significant for hypokalemia, cbc significant for leukocytosis 13,800 and hemoglobin of 11. She was given a dose of prednisone, benadryl and pepcid.         Review of Systems: As per HPI otherwise 10 point review of systems negative.    Past Medical History:  Diagnosis Date  . Arthritis   . Bipolar disorder ([place])    patient denies  . Cancer (HCC)    SKIN    ,    LEFT BREAST   . GERD (gastroesophageal reflux disease)   . Hypertension   . Sleep apnea    USES CPAP     Past Surgical History:  Procedure Laterality Date  . ABDOMINAL EXPOSURE N/A 05/04/2014   Procedure: ABDOMINAL EXPOSURE;  Surgeon: Serafina Mitchell, MD;  Location: [place];  Service: Vascular;  Laterality: N/A;  . ANTERIOR LUMBAR FUSION N/A 05/04/2014   Procedure: ANTERIOR LUMBAR FUSION 1 LEVEL;  Surgeon:  Sinclair Ship, MD;  Location: [place];  Service: Orthopedics;  Laterality: N/A;  Lumbar 5-sacrum 1 anterior lumbar interbody fusion with instrumentation and allograft  . BACK SURGERY     2010 ,2015LUMB FUSION, 06/2013 [facility] FUSION  . BREAST SURGERY     LEFT BRREAST BX  . CARPAL TUNNEL RELEASE     1994 RT  . CARPAL TUNNEL RELEASE Bilateral   . CHOLECYSTECTOMY     2004  . COLONOSCOPY W/ BIOPSIES    . ERCP     2009  . LEEP     [id number]  . SKIN CANCER EXCISION     X 4   . TUBAL LIGATION       reports that she has never smoked. She has never used smokeless tobacco. She reports that she does not drink alcohol or use drugs.  Allergies  Allergen Reactions  . Ciprofloxacin Swelling    Tongue swells  . Cyclobenzaprine Swelling    Feet swell   . Pregabalin Anaphylaxis, Shortness Of Breath, Swelling and Other (See Comments)    Dizziness also  . Fluvastatin Other (See Comments)    Leg pain   . Meloxicam Swelling    Feet became swollen  . Metaxalone Rash and Other (See Comments)    Flushing of the skin and sore throat, too  . Pramipexole Nausea And Vomiting  . Pravastatin Other (See Comments)    Leg pain  . Rosuvastatin Other (See Comments)  Leg pain  . Sertraline Other (See Comments)    Sensitive teeth  . Statins Other (See Comments)    Leg pain     . Amitriptyline Other (See Comments)    Weird feeling all over  . Arthrotec [Diclofenac-Misoprostol] Diarrhea  . Augmentin [Amoxicillin-Pot Clavulanate] Diarrhea    Has patient had a PCN reaction causing immediate rash, facial/tongue/throat swelling, SOB or lightheadedness with hypotension: No Has patient had a PCN reaction causing severe rash involving mucus membranes or skin necrosis: No Has patient had a PCN reaction that required hospitalization: No Has patient had a PCN reaction occurring within the last 10 years: No If all of the above answers are "NO", then may proceed with Cephalosporin use.   . Bromfenac  Diarrhea and Nausea Only  . Cephalexin Diarrhea and Nausea Only  . Conj Estrog-Medroxyprogest Ace Other (See Comments)    Unsure of reaction type Couldn't tolerate  . Diclofenac-Misoprostol Nausea Only and Diarrhea  . Erythromycin Diarrhea and Nausea Only  . Esomeprazole Diarrhea  . Esomeprazole Magnesium Nausea Only  . Levofloxacin Other (See Comments) and Nausea Only    Stomach upset  . Methadone Diarrhea  . Metoclopramide Diarrhea and Nausea Only  . Propoxyphene Diarrhea and Nausea Only  . Rofecoxib Diarrhea and Nausea Only  . Sulfur Diarrhea and Nausea Only  . Tramadol Diarrhea  . Zoloft [Sertraline Hcl] Other (See Comments)    Sensitivity with teeth    History reviewed. No pertinent family history.   Prior to Admission medications   Medication Sig Start Date End Date Taking? Authorizing Provider  allopurinol (ZYLOPRIM) 100 MG tablet Take 100 mg by mouth daily.   Yes [provider]  amLODipine (NORVASC) 10 MG tablet Take 10 mg by mouth daily.   Yes [provider]  aspirin EC 81 MG tablet Take 81 mg by mouth daily.   Yes [provider]  atenolol (TENORMIN) 100 MG tablet Take 100 mg by mouth at bedtime.    Yes [provider]  Calcium Carb-Cholecalciferol (CALCIUM+D3 PO) Take 1 tablet by mouth daily.    Yes [provider]  Cholecalciferol (VITAMIN D3) 2000 units TABS Take 2,000 Units by mouth daily.   Yes [provider]  cloNIDine (CATAPRES) 0.1 MG tablet Take 0.1 mg by mouth at bedtime.   Yes [provider]  cycloSPORINE (RESTASIS) 0.05 % ophthalmic emulsion Place 1 drop into both eyes 4 (four) times daily.   Yes [provider]  fexofenadine (ALLEGRA) 180 MG tablet Take 180 mg by mouth at bedtime.   Yes [provider]  GINKGO BILOBA EXTRACT PO Take 1 tablet by mouth daily.    Yes [provider]  Glucosamine-Chondroitin (COSAMIN DS PO) Take 2 tablets by mouth daily.   Yes [provider]  Lactobacillus (ACIDOPHILUS PO) Take 1 capsule by mouth daily.   Yes [provider]  lansoprazole (PREVACID) 30 MG capsule Take 30 mg by mouth daily before breakfast. 30 minutes before breakfast   Yes [provider]  latanoprost (XALATAN) 0.005 % ophthalmic solution Place 1 drop into both eyes at bedtime.   Yes [provider]  losartan-hydrochlorothiazide (HYZAAR) 100-25 MG tablet Take 1 tablet by mouth daily.   Yes [provider]  Magnesium 70 MG CAPS Take 140 mg by mouth every morning. CRAMP DEFENSE    Yes [provider]  Melatonin 10 MG TABS Take 10 mg by mouth at bedtime. (2100)   Yes [provider]  morphine (  MS CONTIN) 30 MG 12 hr tablet Take 1 tablet (30 mg total) by mouth every 12 (twelve) hours for 14 days. 07/16/17 07/30/17 Yes Phylliss Bob, MD  nortriptyline (PAMELOR) 10 MG capsule Take 10 mg by mouth at bedtime.    Yes [provider]  Omega-3 Fatty Acids (FISH OIL) 500 MG CAPS Take 500 mg by mouth 2 (two) times daily.   Yes [provider]  polyethylene glycol (MIRALAX / GLYCOLAX) packet Take 17 g by mouth daily.    Yes [provider]  spironolactone (ALDACTONE) 25 MG tablet Take 25 mg by mouth daily.   Yes [provider]  timolol (BETIMOL) 0.5 % ophthalmic solution Place 1 drop into both eyes daily.   Yes [provider]  traZODone (DESYREL) 100 MG tablet Take 200 mg by mouth at bedtime.    Yes [provider]  Wheat Dextrin (BENEFIBER PO) Take 15 g by mouth daily.    Yes [provider]  azelastine (ASTELIN) 0.1 % nasal spray Place 1-2 sprays into both nostrils 2 (two) times daily as needed for rhinitis or allergies.     [provider]  diazepam (VALIUM) 5 MG tablet Take 1 tablet (5 mg total) by mouth every 6 (six) hours as needed for muscle spasms (for spasms). 07/16/17   Phylliss Bob, MD  oxyCODONE-acetaminophen (PERCOCET/ROXICET) 5-325  MG tablet Take 1-2 tablets by mouth every 4 (four) hours as needed for severe pain. Take 1-2 tablets by mouth every 4-6 hours as needed for pain 07/16/17   Phylliss Bob, MD  promethazine (PHENERGAN) 12.5 MG tablet Take 12.5 mg by mouth 2 (two) times daily as needed for nausea or vomiting.  06/22/17   [provider]    Physical Exam: Vitals:   07/19/17 1553 07/19/17 1600 07/19/17 1630 07/19/17 1750  BP: (!) 148/48 (!) 154/46 (!) 164/50 (!) 164/50  Pulse: 70 69 72 72  Resp: 18   18  Temp:      TempSrc:      SpO2: 100% 100% 99% 99%  Weight:      Height:        Constitutional: NAD, calm, comfortable Vitals:   07/19/17 1553 07/19/17 1600 07/19/17 1630 07/19/17 1750  BP: (!) 148/48 (!) 154/46 (!) 164/50 (!) 164/50  Pulse: 70 69 72 72  Resp: 18   18  Temp:      TempSrc:      SpO2: 100% 100% 99% 99%  Weight:      Height:       Eyes: PERRL, lids and conjunctivae normal ENMT: lip swelling and facial erythema.  Neck: normal, supple, no masses, no thyromegaly Respiratory: clear to auscultation bilaterally, no wheezing, no crackles. Normal respiratory effort. No accessory muscle use.  Cardiovascular: Regular rate and rhythm, no murmurs / rubs / gallops. No extremity edema. 2+ pedal pulses. No carotid bruits.  Abdomen: no tenderness, no masses palpated. No hepatosplenomegaly. Bowel sounds positive.  Musculoskeletal: no clubbing / cyanosis. No joint deformity upper and lower extremities. Good ROM, no contractures. Normal muscle tone.  Skin: erythematous, raised wheels on the extensor aspect of the arms, neck, groin area, with itching. No tenderness.  Neurologic: CN 2-12 grossly intact. Sensation intact,  Strength 5/5 in all 4.  Psychiatric: Normal judgment and insight. Alert and oriented x 3. Normal mood.    Labs on Admission: I have personally reviewed following labs and imaging studies  CBC: Recent Labs  Lab 07/13/17 2156 07/19/17 1332  WBC 8.2 13.8*  NEUTROABS 5.8 10.0*    HGB 9.4* 11.1*  HCT 28.6* 33.8*  MCV 87.7 88.7  PLT 271 836*   Basic Metabolic Panel: Recent Labs  Lab 07/19/17 1332  NA 135  K 3.3*  CL 96*  CO2 25  GLUCOSE 125*  BUN 16  CREATININE 0.96  CALCIUM 9.2   GFR: Estimated Creatinine Clearance: 60.4 mL/min (by C-G formula based on SCr of 0.96 mg/dL). Liver Function Tests: No results for input(s): AST, ALT, ALKPHOS, BILITOT, PROT, ALBUMIN in the last 168 hours. No results for input(s): LIPASE, AMYLASE in the last 168 hours. No results for input(s): AMMONIA in the last 168 hours. Coagulation Profile: No results for input(s): INR, PROTIME in the last 168 hours. Cardiac Enzymes: No results for input(s): CKTOTAL, CKMB, CKMBINDEX, TROPONINI in the last 168 hours. BNP (last 3 results) No results for input(s): PROBNP in the last 8760 hours. HbA1C: No results for input(s): HGBA1C in the last 72 hours. CBG: No results for input(s): GLUCAP in the last 168 hours. Lipid Profile: No results for input(s): CHOL, HDL, LDLCALC, TRIG, CHOLHDL, LDLDIRECT in the last 72 hours. Thyroid Function Tests: No results for input(s): TSH, T4TOTAL, FREET4, T3FREE, THYROIDAB in the last 72 hours. Anemia Panel: No results for input(s): VITAMINB12, FOLATE, FERRITIN, TIBC, IRON, RETICCTPCT in the last 72 hours. Urine analysis:    Component Value Date/Time   COLORURINE YELLOW 07/09/2017 [place] 07/09/2017 0659   LABSPEC 1.006 07/09/2017 0659   PHURINE 7.0 07/09/2017 0659   GLUCOSEU NEGATIVE 07/09/2017 0659   HGBUR NEGATIVE 07/09/2017 0659   BILIRUBINUR NEGATIVE 07/09/2017 0659   [place] 07/09/2017 0659   PROTEINUR NEGATIVE 07/09/2017 0659   UROBILINOGEN 0.2 04/25/2014 1234   NITRITE NEGATIVE 07/09/2017 0659   LEUKOCYTESUR NEGATIVE 07/09/2017 0659    Radiological Exams on Admission: No results found.  EKG: not done.   Assessment/Plan Active Problems:   Urticaria  Urticaria/ Allergic reaction:  Unclear etiology,  could be from the soap vs dermacloud vs percocet.  Continue with IV steroids, IV benadryl and IV pepcid.  Monitor  For anaphylactic reaction.     Hypertension:  Holding amlodipine, hctz and arb and resume clonidine.   Recent L5 S1 fusion surgery: Resume pain meds with morphine sulfate and IV morphine for breakthrough pain.  Physical therapy will be ordered in am.    Dysuria: - get UA.   Hypokalemia:  Replaced.    Mild normocytic anemia:  Hemoglobin stable around 11.       DVT prophylaxis: lovenox.  Code Status: full code.  Family Communication: family at bedside.  Disposition Plan: pending resolution of the rash and facial swelling.  Consults called: none.  Admission status: obs/tele.    Hosie Poisson MD [facility] Pager [phone]  If 7PM-7AM, please contact night-coverage [email or web address] Password [facility]  07/19/2017, 6:21 PM

## 2017-07-19 NOTE — ED Notes (Signed)
ED TO INPATIENT HANDOFF REPORT  Name/Age/Gender Karen Castillo 77 y.o. female  Code Status Code Status History    Date Active Date Inactive Code Status Order ID Comments User Context   07/13/2017 2200 07/16/2017 2227 Full Code [id number]  [place] Else ED   07/09/2017 1621 07/11/2017 1629 Full Code [id number]  Justice Britain, PA-C Inpatient   05/04/2014 1440 05/06/2014 1418 Full Code [id number]  Justice Britain, PA-C Inpatient      Home/SNF/Other Home  Chief Complaint allergic reaction   Level of Care/Admitting Diagnosis ED Disposition    ED Disposition Condition [place] [facility] Area: [facility] [[id number]]  Level of Care: Med-Surg [16]  Diagnosis: Urticaria [416384]  Admitting Physician: Hosie Poisson [[id number]]  Attending Physician: Hosie Poisson [[id number]]  PT Class (Do Not Modify): Observation [104]  PT Acc Code (Do Not Modify): Observation [10022]       Medical History Past Medical History:  Diagnosis Date  . Arthritis   . Bipolar disorder ([place])    patient denies  . Cancer (HCC)    SKIN    ,    LEFT BREAST   . GERD (gastroesophageal reflux disease)   . Hypertension   . Sleep apnea    USES CPAP     Allergies Allergies  Allergen Reactions  . Ciprofloxacin Swelling    Tongue swells  . Cyclobenzaprine Swelling    Feet swell   . Pregabalin Anaphylaxis, Shortness Of Breath, Swelling and Other (See Comments)    Dizziness also  . Fluvastatin Other (See Comments)    Leg pain   . Meloxicam Swelling    Feet became swollen  . Metaxalone Rash and Other (See Comments)    Flushing of the skin and sore throat, too  . Pramipexole Nausea And Vomiting  . Pravastatin Other (See Comments)    Leg pain  . Rosuvastatin Other (See Comments)    Leg pain  . Sertraline Other (See Comments)    Sensitive teeth  . Statins Other (See Comments)    Leg pain     . Amitriptyline Other (See Comments)    Weird feeling all over  . Arthrotec  [Diclofenac-Misoprostol] Diarrhea  . Augmentin [Amoxicillin-Pot Clavulanate] Diarrhea    Has patient had a PCN reaction causing immediate rash, facial/tongue/throat swelling, SOB or lightheadedness with hypotension: No Has patient had a PCN reaction causing severe rash involving mucus membranes or skin necrosis: No Has patient had a PCN reaction that required hospitalization: No Has patient had a PCN reaction occurring within the last 10 years: No If all of the above answers are "NO", then may proceed with Cephalosporin use.   . Bromfenac Diarrhea and Nausea Only  . Cephalexin Diarrhea and Nausea Only  . Conj Estrog-Medroxyprogest Ace Other (See Comments)    Unsure of reaction type Couldn't tolerate  . Diclofenac-Misoprostol Nausea Only and Diarrhea  . Erythromycin Diarrhea and Nausea Only  . Esomeprazole Diarrhea  . Esomeprazole Magnesium Nausea Only  . Levofloxacin Other (See Comments) and Nausea Only    Stomach upset  . Methadone Diarrhea  . Metoclopramide Diarrhea and Nausea Only  . Propoxyphene Diarrhea and Nausea Only  . Rofecoxib Diarrhea and Nausea Only  . Sulfur Diarrhea and Nausea Only  . Tramadol Diarrhea  . Zoloft [Sertraline Hcl] Other (See Comments)    Sensitivity with teeth    IV Location/Drains/Wounds Patient Lines/Drains/Airways Status   Active Line/Drains/Airways    Name:   Placement date:   Placement  time:   Site:   Days:   Peripheral IV 07/19/17 Right Antecubital   07/19/17    1344    Antecubital   less than 1   External Urinary Catheter   07/13/17    2230    -   6   Incision (Closed) 05/04/14 Abdomen Left   05/04/14    1123     1172   Incision (Closed) 07/09/17 Back Other (Comment)   07/09/17    1216     10          Labs/Imaging Results for orders placed or performed during the hospital encounter of 07/19/17 (from the past 48 hour(s))  Basic metabolic panel     Status: Abnormal   Collection Time: 07/19/17  1:32 PM  Result Value Ref Range   Sodium  135 135 - 145 mmol/L   Potassium 3.3 (L) 3.5 - 5.1 mmol/L   Chloride 96 (L) 101 - 111 mmol/L   CO2 25 22 - 32 mmol/L   Glucose, Bld 125 (H) 65 - 99 mg/dL   BUN 16 6 - 20 mg/dL   Creatinine, Ser 0.96 0.44 - 1.00 mg/dL   Calcium 9.2 8.9 - 10.3 mg/dL   GFR calc non Af Amer 56 (L) >60 mL/min   GFR calc Af Amer >60 >60 mL/min    Comment: (NOTE) The eGFR has been calculated using the CKD EPI equation. This calculation has not been validated in all clinical situations. eGFR's persistently <60 mL/min signify possible Chronic Kidney Disease.    Anion gap 14 5 - 15    Comment: Performed at [facility], [place] [address], [place], [place] [address]  CBC with Differential     Status: Abnormal   Collection Time: 07/19/17  1:32 PM  Result Value Ref Range   WBC 13.8 (H) 4.0 - 10.5 K/uL   RBC 3.81 (L) 3.87 - 5.11 MIL/uL   Hemoglobin 11.1 (L) 12.0 - 15.0 g/dL   HCT 33.8 (L) 36.0 - 46.0 %   MCV 88.7 78.0 - 100.0 fL   MCH 29.1 26.0 - 34.0 pg   MCHC 32.8 30.0 - 36.0 g/dL   RDW 13.5 11.5 - 15.5 %   Platelets 493 (H) 150 - 400 K/uL   Neutrophils Relative % 73 %   Neutro Abs 10.0 (H) 1.7 - 7.7 K/uL   Lymphocytes Relative 21 %   Lymphs Abs 2.9 0.7 - 4.0 K/uL   Monocytes Relative 4 %   Monocytes Absolute 0.5 0.1 - 1.0 K/uL   Eosinophils Relative 2 %   Eosinophils Absolute 0.3 0.0 - 0.7 K/uL   Basophils Relative 0 %   Basophils Absolute 0.0 0.0 - 0.1 K/uL    Comment: Performed at [facility], [place] [address], [place], [place] [address]   No results found.  Pending Labs [employer] (From admission, onward)   Start     Ordered   Signed and Held  Creatinine, serum  (enoxaparin (LOVENOX)    CrCl >/= 30 ml/min)  Weekly,   R    Comments:  while on enoxaparin therapy    Signed and Held      Vitals/Pain Today's Vitals   07/19/17 1553 07/19/17 1600 07/19/17 1630 07/19/17 1750  BP: (!) 148/48 (!) 154/46 (!) 164/50 (!) 164/50  Pulse: 70 69 72 72  Resp: 18    18  Temp:      TempSrc:      SpO2: 100% 100% 99% 99%  Weight:      Height:      PainSc:        Isolation Precautions No active isolations  Medications Medications  ondansetron (ZOFRAN) injection 4 mg (has no administration in time range)  morphine 2 MG/ML injection 1-2 mg (has no administration in time range)  methylPREDNISolone sodium succinate (SOLU-MEDROL) 125 mg/2 mL injection 60 mg (has no administration in time range)  diphenhydrAMINE (BENADRYL) injection 12.5 mg (has no administration in time range)  famotidine (PEPCID) IVPB 20 mg premix (0 mg Intravenous Stopped 07/19/17 1345)  diphenhydrAMINE (BENADRYL) injection 25 mg (25 mg Intravenous Given 07/19/17 1345)  hydrOXYzine (ATARAX/VISTARIL) tablet 25 mg (25 mg Oral Given 07/19/17 1552)  oxyCODONE-acetaminophen (PERCOCET/ROXICET) 5-325 MG per tablet 1 tablet (1 tablet Oral Given 07/19/17 1552)  predniSONE (DELTASONE) tablet 60 mg (60 mg Oral Given 07/19/17 1635)  potassium chloride SA (K-DUR,KLOR-CON) CR tablet 40 mEq (40 mEq Oral Given 07/19/17 1824)    Mobility walks

## 2017-07-20 ENCOUNTER — Other Ambulatory Visit: Payer: Self-pay

## 2017-07-20 DIAGNOSIS — D649 Anemia, unspecified: Secondary | ICD-10-CM | POA: Diagnosis present

## 2017-07-20 DIAGNOSIS — M25551 Pain in right hip: Secondary | ICD-10-CM | POA: Diagnosis not present

## 2017-07-20 DIAGNOSIS — L509 Urticaria, unspecified: Secondary | ICD-10-CM | POA: Diagnosis present

## 2017-07-20 DIAGNOSIS — R300 Dysuria: Secondary | ICD-10-CM | POA: Diagnosis present

## 2017-07-20 DIAGNOSIS — L500 Allergic urticaria: Secondary | ICD-10-CM | POA: Diagnosis present

## 2017-07-20 DIAGNOSIS — G4733 Obstructive sleep apnea (adult) (pediatric): Secondary | ICD-10-CM | POA: Diagnosis present

## 2017-07-20 DIAGNOSIS — Z881 Allergy status to other antibiotic agents status: Secondary | ICD-10-CM | POA: Diagnosis not present

## 2017-07-20 DIAGNOSIS — K219 Gastro-esophageal reflux disease without esophagitis: Secondary | ICD-10-CM | POA: Diagnosis present

## 2017-07-20 DIAGNOSIS — R52 Pain, unspecified: Secondary | ICD-10-CM | POA: Diagnosis not present

## 2017-07-20 DIAGNOSIS — F319 Bipolar disorder, unspecified: Secondary | ICD-10-CM | POA: Diagnosis present

## 2017-07-20 DIAGNOSIS — E669 Obesity, unspecified: Secondary | ICD-10-CM | POA: Diagnosis present

## 2017-07-20 DIAGNOSIS — T7840XA Allergy, unspecified, initial encounter: Secondary | ICD-10-CM | POA: Diagnosis not present

## 2017-07-20 DIAGNOSIS — Z888 Allergy status to other drugs, medicaments and biological substances status: Secondary | ICD-10-CM | POA: Diagnosis not present

## 2017-07-20 DIAGNOSIS — Z9049 Acquired absence of other specified parts of digestive tract: Secondary | ICD-10-CM | POA: Diagnosis not present

## 2017-07-20 DIAGNOSIS — Z6841 Body Mass Index (BMI) 40.0 and over, adult: Secondary | ICD-10-CM | POA: Diagnosis not present

## 2017-07-20 DIAGNOSIS — Z7982 Long term (current) use of aspirin: Secondary | ICD-10-CM | POA: Diagnosis not present

## 2017-07-20 DIAGNOSIS — D72829 Elevated white blood cell count, unspecified: Secondary | ICD-10-CM | POA: Diagnosis present

## 2017-07-20 DIAGNOSIS — E876 Hypokalemia: Secondary | ICD-10-CM | POA: Diagnosis present

## 2017-07-20 DIAGNOSIS — Z981 Arthrodesis status: Secondary | ICD-10-CM | POA: Diagnosis not present

## 2017-07-20 DIAGNOSIS — I10 Essential (primary) hypertension: Secondary | ICD-10-CM | POA: Diagnosis present

## 2017-07-20 DIAGNOSIS — Z882 Allergy status to sulfonamides status: Secondary | ICD-10-CM | POA: Diagnosis not present

## 2017-07-20 LAB — BASIC METABOLIC PANEL
ANION GAP: 10 (ref 5–15)
BUN: 15 mg/dL (ref 6–20)
CALCIUM: 9 mg/dL (ref 8.9–10.3)
CO2: 23 mmol/L (ref 22–32)
CREATININE: 0.7 mg/dL (ref 0.44–1.00)
Chloride: 99 mmol/L — ABNORMAL LOW (ref 101–111)
Glucose, Bld: 162 mg/dL — ABNORMAL HIGH (ref 65–99)
Potassium: 4.4 mmol/L (ref 3.5–5.1)
SODIUM: 132 mmol/L — AB (ref 135–145)

## 2017-07-20 LAB — URINALYSIS, ROUTINE W REFLEX MICROSCOPIC
Bilirubin Urine: NEGATIVE
Glucose, UA: 150 mg/dL — AB
Hgb urine dipstick: NEGATIVE
Ketones, ur: NEGATIVE mg/dL
Leukocytes, UA: NEGATIVE
NITRITE: NEGATIVE
PROTEIN: NEGATIVE mg/dL
SPECIFIC GRAVITY, URINE: 1.014 (ref 1.005–1.030)
pH: 5 (ref 5.0–8.0)

## 2017-07-20 LAB — CBC
HCT: 31.9 % — ABNORMAL LOW (ref 36.0–46.0)
HEMOGLOBIN: 10.6 g/dL — AB (ref 12.0–15.0)
MCH: 29.3 pg (ref 26.0–34.0)
MCHC: 33.2 g/dL (ref 30.0–36.0)
MCV: 88.1 fL (ref 78.0–100.0)
PLATELETS: 432 10*3/uL — AB (ref 150–400)
RBC: 3.62 MIL/uL — AB (ref 3.87–5.11)
RDW: 13.3 % (ref 11.5–15.5)
WBC: 12.8 10*3/uL — AB (ref 4.0–10.5)

## 2017-07-20 MED ORDER — METHYLPREDNISOLONE SODIUM SUCC 125 MG IJ SOLR
60.0000 mg | INTRAMUSCULAR | Status: DC
  Administered 2017-07-21: 60 mg via INTRAVENOUS
  Filled 2017-07-20: qty 2

## 2017-07-20 NOTE — Progress Notes (Signed)
PROGRESS NOTE    Karen Castillo  MWU:[id number] DOB: 06-13-1940 DOA: 07/19/2017 PCP: Charletta Cousin, MD (Inactive)    Brief Narrative: Karen Castillo is a 77 y.o. female with medical history significant of chronic back pain, s/p L5 S1 fusion procedure, hypertension, GERD, OSA s/p CPAP, Bipolar disorder, was discharged to rehab post procedure. Pt reports allergic reaction starting with lip swelling, left side of the face, tongue swelling, and  erythematous wheels of urticaria on the upper extremities associated  With itching, which later progressed to the lower extremities after arrival to ED, despite epinephrine, prednisone and benadryl given in ED   Assessment & Plan:   Active Problems:   Urticaria   Urticaria/ Allergic reaction:  Unclear etiology, could be from the soap vs dermacloud vs percocet.  Continue with IV steroids, IV benadryl and IV pepcid.  Monitor  For anaphylactic reaction.  The erythema, swelling and swelling of the lip has improved.    Hypertension:  SUBoptimal, will resume home meds in am.   Recent L5 S1 fusion surgery: Resume pain meds with morphine sulfate and IV morphine for breakthrough pain.  PT evaluation.    Dysuria: - get UA. UA PENDING.   Hypokalemia:  Replaced.   Mild normocytic anemia:  Hemoglobin stable around 11.   Leukocytosis improving.  Possibly reactive.     DVT prophylaxis: lovenox.  Code Status: full code.  Family Communication: (none at bedside.  Disposition Plan:back to SNF in am. .   Consultants:   *none.   Procedures: none.  Antimicrobials:none.   Subjective: No breathing issues, the erythema, swelling and facial swelling improved but not resolved yet.  Objective: Vitals:   07/19/17 1600 07/19/17 1630 07/19/17 1750 07/20/17 0613  BP: (!) 154/46 (!) 164/50 (!) 164/50 (!) 144/56  Pulse: 69 72 72 69  Resp:   18 18  Temp:    99.8 F (37.7 C)  TempSrc:    Oral  SpO2: 100% 99% 99% 97%  Weight:      Height:         Intake/Output Summary (Last 24 hours) at 07/20/2017 1007 Last data filed at 07/20/2017 0631 Gross per 24 hour  Intake 50 ml  Output 2000 ml  Net -1950 ml   Filed Weights   07/19/17 1311  Weight: 107 kg (236 lb)    Examination:  General exam: Appears calm and comfortable , facial and lip swelling improved.  Respiratory system: Clear to auscultation. Respiratory effort normal. Cardiovascular system: S1 & S2 heard, RRR. No JVD, murmurs, rubs, gallops or clicks. No pedal edema. Gastrointestinal system: Abdomen is nondistended, soft and nontender. No organomegaly or masses felt. Normal bowel sounds heard. Central nervous system: Alert and oriented. No focal neurological deficits. Extremities: Symmetric 5 x 5 power. Skin: No rashes, lesions or ulcers Psychiatry: . Mood & affect appropriate.     Data Reviewed: I have personally reviewed following labs and imaging studies  CBC: Recent Labs  Lab 07/13/17 2156 07/19/17 1332 07/20/17 0400  WBC 8.2 13.8* 12.8*  NEUTROABS 5.8 10.0*  --   HGB 9.4* 11.1* 10.6*  HCT 28.6* 33.8* 31.9*  MCV 87.7 88.7 88.1  PLT 271 493* 725*   Basic Metabolic Panel: Recent Labs  Lab 07/19/17 1332 07/20/17 0400  NA 135 132*  K 3.3* 4.4  CL 96* 99*  CO2 25 23  GLUCOSE 125* 162*  BUN 16 15  CREATININE 0.96 0.70  CALCIUM 9.2 9.0   GFR: Estimated Creatinine Clearance: 72.5 mL/min (by C-G  formula based on SCr of 0.7 mg/dL). Liver Function Tests: No results for input(s): AST, ALT, ALKPHOS, BILITOT, PROT, ALBUMIN in the last 168 hours. No results for input(s): LIPASE, AMYLASE in the last 168 hours. No results for input(s): AMMONIA in the last 168 hours. Coagulation Profile: No results for input(s): INR, PROTIME in the last 168 hours. Cardiac Enzymes: No results for input(s): CKTOTAL, CKMB, CKMBINDEX, TROPONINI in the last 168 hours. BNP (last 3 results) No results for input(s): PROBNP in the last 8760 hours. HbA1C: No results for  input(s): HGBA1C in the last 72 hours. CBG: No results for input(s): GLUCAP in the last 168 hours. Lipid Profile: No results for input(s): CHOL, HDL, LDLCALC, TRIG, CHOLHDL, LDLDIRECT in the last 72 hours. Thyroid Function Tests: No results for input(s): TSH, T4TOTAL, FREET4, T3FREE, THYROIDAB in the last 72 hours. Anemia Panel: No results for input(s): VITAMINB12, FOLATE, FERRITIN, TIBC, IRON, RETICCTPCT in the last 72 hours. Sepsis Labs: No results for input(s): PROCALCITON, LATICACIDVEN in the last 168 hours.  No results found for this or any previous visit (from the past 240 hour(s)).       Radiology Studies: No results found.      Scheduled Meds: . cloNIDine  0.1 mg Oral QHS  . cycloSPORINE  1 drop Both Eyes QID  . enoxaparin (LOVENOX) injection  40 mg Subcutaneous Q24H  . latanoprost  1 drop Both Eyes QHS  . Melatonin  10 mg Oral QHS  . methylPREDNISolone (SOLU-MEDROL) injection  60 mg Intravenous Q6H  . morphine  30 mg Oral Q12H  . timolol  1 drop Both Eyes Daily  . traZODone  200 mg Oral QHS   Continuous Infusions:   LOS: 0 days    Time spent: 24 minutes.     Hosie Poisson, MD [facility] Pager [phone]  If 7PM-7AM, please contact night-coverage [email or web address] Password [email or web address] 07/20/2017, 10:07 AM

## 2017-07-20 NOTE — Progress Notes (Signed)
Pt received in room 1345 via stretcher from the ER. Alert & oriented. Oriented to room, safety, ordering meals and use of call light. Family members at bedside. No acute distress noted. Facial edema decreasing, redness to hives on generalized body has faded to pink color at this time. No signs of resp distress noted.

## 2017-07-20 NOTE — Progress Notes (Signed)
Pt reports that she was at a "rehab" facility for therapy for strenghtening after having had back surgery around the first of the month at  [facility] facility. Pt states that staff had "applied lotion" to her body which she thinks is what caused the allergic reaction. Pt continues to have some facial swelling around eyes, and hives noted on generalized body. Pt in no acute distress at this time. Answers all questions appropriately. Midline incision noted to lower back with steristrips intact. Incision line is well approximated with no redness or drainage noted. Pt states that she will need to return to rehab upon discharge because she "needs more strengthening training", but wishes to return to daughter's home once strength has been regained.

## 2017-07-20 NOTE — Progress Notes (Signed)
Pts. CPAP adjusted per request, ramp placed, humidifier refilled, able to place on independently, made aware to notify if needed, remains on room air.

## 2017-07-21 MED ORDER — POLYETHYLENE GLYCOL 3350 17 G PO PACK
17.0000 g | PACK | Freq: Every day | ORAL | Status: DC
  Administered 2017-07-21 – 2017-07-22 (×2): 17 g via ORAL
  Filled 2017-07-21 (×2): qty 1

## 2017-07-21 MED ORDER — HYDROCODONE-ACETAMINOPHEN 5-325 MG PO TABS
1.0000 | ORAL_TABLET | ORAL | Status: DC | PRN
  Administered 2017-07-21 – 2017-07-22 (×3): 2 via ORAL
  Administered 2017-07-22: 1 via ORAL
  Filled 2017-07-21 (×4): qty 2

## 2017-07-21 MED ORDER — SENNOSIDES-DOCUSATE SODIUM 8.6-50 MG PO TABS
2.0000 | ORAL_TABLET | Freq: Two times a day (BID) | ORAL | Status: DC
  Administered 2017-07-21 – 2017-07-22 (×2): 2 via ORAL
  Filled 2017-07-21 (×2): qty 2

## 2017-07-21 MED ORDER — HYDROCODONE-ACETAMINOPHEN 5-325 MG PO TABS: 1.0000 | ORAL_TABLET | ORAL | 0 refills | Status: AC | PRN

## 2017-07-21 NOTE — Discharge Summary (Signed)
Physician Discharge Summary  Karen Castillo ERD:[id number] DOB: 1940-07-11 DOA: 07/19/2017  PCP: Charletta Cousin, MD (Inactive)  Admit date: 07/19/2017 Discharge date: 07/21/2017  Admitted From: SNF  Disposition:  SNF  Recommendations for Outpatient Follow-up:  1. Follow up with PCP in 1-2 weeks 2. Please obtain BMP/CBC in one week Please follow up with neuro surgery as recommended.   Discharge Condition: stable.  CODE STATUS: full code.  Diet recommendation: Heart Healthy   Brief/Interim Summary: Karen Round Jonesis a 77 y.o.femalewith medical history significant ofchronicback pain, s/p L5 S1 fusion procedure, hypertension, GERD, OSA s/p CPAP, Bipolar disorder, was discharged to rehab post procedure. Pt reports allergic reaction starting with lip swelling, left side of the face, tongue swelling, and erythematous wheels of urticaria on the upper extremities associated With itching, which later progressed to the lower extremities after arrival to ED, despite epinephrine, prednisone and benadryl given in ED    Discharge Diagnoses:  Active Problems:   Urticaria  Urticaria/ Allergic reaction:  Unclear etiology, could be from the soap vs dermacloud vs percocet.  started IV steroids, IV benadryl and IV pepcid.  The erythema, swelling of the face  and swelling of the lip has resolved   Hypertension:  Well controlled. Resume home meds on discharge.   Recent L5 S1 fusion surgery: Resume pain meds with morphine sulfate and vicodin  for breakthrough pain.  PT evaluation.    Dysuria: Resolved. UA is negative for infection.   Hypokalemia:  Replaced.   Mild normocytic anemia:  Hemoglobin stable around 11.  Leukocytosis improving.  Possibly reactive.      Discharge Instructions   Allergies as of 07/21/2017      Reactions   Ciprofloxacin Swelling   Tongue swells   Cyclobenzaprine Swelling   Feet swell   Pregabalin Anaphylaxis, Shortness Of Breath, Swelling,  Other (See Comments)   Dizziness also   Fluvastatin Other (See Comments)   Leg pain   Meloxicam Swelling   Feet became swollen   Metaxalone Rash, Other (See Comments)   Flushing of the skin and sore throat, too   Pramipexole Nausea And Vomiting   Pravastatin Other (See Comments)   Leg pain   Rosuvastatin Other (See Comments)   Leg pain   Sertraline Other (See Comments)   Sensitive teeth   Statins Other (See Comments)   Leg pain    Amitriptyline Other (See Comments)   Weird feeling all over   Arthrotec [diclofenac-misoprostol] Diarrhea   Augmentin [amoxicillin-pot Clavulanate] Diarrhea   Has patient had a PCN reaction causing immediate rash, facial/tongue/throat swelling, SOB or lightheadedness with hypotension: No Has patient had a PCN reaction causing severe rash involving mucus membranes or skin necrosis: No Has patient had a PCN reaction that required hospitalization: No Has patient had a PCN reaction occurring within the last 10 years: No If all of the above answers are "NO", then may proceed with Cephalosporin use.   Bromfenac Diarrhea, Nausea Only   Cephalexin Diarrhea, Nausea Only   Conj Estrog-medroxyprogest Ace Other (See Comments)   Unsure of reaction type Couldn't tolerate   Diclofenac-misoprostol Nausea Only, Diarrhea   Erythromycin Diarrhea, Nausea Only   Esomeprazole Diarrhea   Esomeprazole Magnesium Nausea Only   Levofloxacin Other (See Comments), Nausea Only   Stomach upset   Methadone Diarrhea   Metoclopramide Diarrhea, Nausea Only   Propoxyphene Diarrhea, Nausea Only   Rofecoxib Diarrhea, Nausea Only   Sulfur Diarrhea, Nausea Only   Tramadol Diarrhea   Zoloft [sertraline Hcl] Other (  See Comments)   Sensitivity with teeth      Medication List    STOP taking these medications   oxyCODONE-acetaminophen 5-325 MG tablet Commonly known as:  PERCOCET/ROXICET     TAKE these medications   ACIDOPHILUS PO Take 1 capsule by mouth daily.   allopurinol  100 MG tablet Commonly known as:  ZYLOPRIM Take 100 mg by mouth daily.   amLODipine 10 MG tablet Commonly known as:  NORVASC Take 10 mg by mouth daily.   aspirin EC 81 MG tablet Take 81 mg by mouth daily.   atenolol 100 MG tablet Commonly known as:  TENORMIN Take 100 mg by mouth at bedtime.   azelastine 0.1 % nasal spray Commonly known as:  ASTELIN Place 1-2 sprays into both nostrils 2 (two) times daily as needed for rhinitis or allergies.   BENEFIBER PO Take 15 g by mouth daily.   CALCIUM+D3 PO Take 1 tablet by mouth daily.   cloNIDine 0.1 MG tablet Commonly known as:  CATAPRES Take 0.1 mg by mouth at bedtime.   COSAMIN DS PO Take 2 tablets by mouth daily.   cycloSPORINE 0.05 % ophthalmic emulsion Commonly known as:  RESTASIS Place 1 drop into both eyes 4 (four) times daily.   diazepam 5 MG tablet Commonly known as:  VALIUM Take 1 tablet (5 mg total) by mouth every 6 (six) hours as needed for muscle spasms (for spasms).   fexofenadine 180 MG tablet Commonly known as:  ALLEGRA Take 180 mg by mouth at bedtime.   Fish Oil 500 MG Caps Take 500 mg by mouth 2 (two) times daily.   GINKGO BILOBA EXTRACT PO Take 1 tablet by mouth daily.   HYDROcodone-acetaminophen 5-325 MG tablet Commonly known as:  NORCO/VICODIN Take 1-2 tablets by mouth every 4 (four) hours as needed for up to 3 days for moderate pain (for breakthrough pain).   lansoprazole 30 MG capsule Commonly known as:  PREVACID Take 30 mg by mouth daily before breakfast. 30 minutes before breakfast   latanoprost 0.005 % ophthalmic solution Commonly known as:  XALATAN Place 1 drop into both eyes at bedtime.   losartan-hydrochlorothiazide 100-25 MG tablet Commonly known as:  HYZAAR Take 1 tablet by mouth daily.   Magnesium 70 MG Caps Take 140 mg by mouth every morning. CRAMP DEFENSE   Melatonin 10 MG Tabs Take 10 mg by mouth at bedtime. (2100)   morphine 30 MG 12 hr tablet Commonly known as:  MS  CONTIN Take 1 tablet (30 mg total) by mouth every 12 (twelve) hours for 14 days.   nortriptyline 10 MG capsule Commonly known as:  PAMELOR Take 10 mg by mouth at bedtime.   polyethylene glycol packet Commonly known as:  MIRALAX / GLYCOLAX Take 17 g by mouth daily.   promethazine 12.5 MG tablet Commonly known as:  PHENERGAN Take 12.5 mg by mouth 2 (two) times daily as needed for nausea or vomiting.   spironolactone 25 MG tablet Commonly known as:  ALDACTONE Take 25 mg by mouth daily.   timolol 0.5 % ophthalmic solution Commonly known as:  BETIMOL Place 1 drop into both eyes daily.   traZODone 100 MG tablet Commonly known as:  DESYREL Take 200 mg by mouth at bedtime.   Vitamin D3 2000 units Tabs Take 2,000 Units by mouth daily.       Allergies  Allergen Reactions  . Ciprofloxacin Swelling    Tongue swells  . Cyclobenzaprine Swelling    Feet swell   .  Pregabalin Anaphylaxis, Shortness Of Breath, Swelling and Other (See Comments)    Dizziness also  . Fluvastatin Other (See Comments)    Leg pain   . Meloxicam Swelling    Feet became swollen  . Metaxalone Rash and Other (See Comments)    Flushing of the skin and sore throat, too  . Pramipexole Nausea And Vomiting  . Pravastatin Other (See Comments)    Leg pain  . Rosuvastatin Other (See Comments)    Leg pain  . Sertraline Other (See Comments)    Sensitive teeth  . Statins Other (See Comments)    Leg pain     . Amitriptyline Other (See Comments)    Weird feeling all over  . Arthrotec [Diclofenac-Misoprostol] Diarrhea  . Augmentin [Amoxicillin-Pot Clavulanate] Diarrhea    Has patient had a PCN reaction causing immediate rash, facial/tongue/throat swelling, SOB or lightheadedness with hypotension: No Has patient had a PCN reaction causing severe rash involving mucus membranes or skin necrosis: No Has patient had a PCN reaction that required hospitalization: No Has patient had a PCN reaction occurring within  the last 10 years: No If all of the above answers are "NO", then may proceed with Cephalosporin use.   . Bromfenac Diarrhea and Nausea Only  . Cephalexin Diarrhea and Nausea Only  . Conj Estrog-Medroxyprogest Ace Other (See Comments)    Unsure of reaction type Couldn't tolerate  . Diclofenac-Misoprostol Nausea Only and Diarrhea  . Erythromycin Diarrhea and Nausea Only  . Esomeprazole Diarrhea  . Esomeprazole Magnesium Nausea Only  . Levofloxacin Other (See Comments) and Nausea Only    Stomach upset  . Methadone Diarrhea  . Metoclopramide Diarrhea and Nausea Only  . Propoxyphene Diarrhea and Nausea Only  . Rofecoxib Diarrhea and Nausea Only  . Sulfur Diarrhea and Nausea Only  . Tramadol Diarrhea  . Zoloft [Sertraline Hcl] Other (See Comments)    Sensitivity with teeth    Consultations:  None.    Procedures/Studies: Dg Chest 2 View  Result Date: 07/09/2017 CLINICAL DATA:  Preoperative examination.  No new chest complaints. EXAM: CHEST - 2 VIEW COMPARISON:  08/28/2014 FINDINGS: Shallow inspiration. Heart size and pulmonary vascularity are normal. No airspace disease or consolidation in the lungs. Linear fibrosis in the lung bases. No change since previous study. No blunting of costophrenic angles. No pneumothorax. Mediastinal contours appear intact. Calcification of the aorta. Postoperative changes with posterior fixation of the lower thoracic and upper lumbar spine. Surgical clips in the right upper quadrant. IMPRESSION: Linear fibrosis in the lung bases similar to previous study. No evidence of active pulmonary disease. Coronary artery calcifications. Electronically Signed   By: Lucienne Capers M.D.   On: 07/09/2017 06:43   Dg Lumbar Spine 2-3 Views  Result Date: 07/09/2017 CLINICAL DATA:  Spinal fusion EXAM: LUMBAR SPINE - 2-3 VIEW; DG C-ARM 61-120 MIN COMPARISON:  Lumbar CT May 26, 2017 FLUOROSCOPY TIME:  2 minutes 8 seconds; 2 acquired images FINDINGS: Frontal and lateral views  were obtained. There is posterior screw and plate fixation at L3, L4, L5, and S1 with screws transfixing both sacroiliac joints. There is anterior screw and plate fixation at L5 and S1. No fracture or spondylolisthesis. There is mild disc space narrowing at L5-S1. IMPRESSION: Extensive postoperative change with new screws transfixing the sacroiliac joint regions. No fracture or spondylolisthesis. Electronically Signed   By: Lowella Grip III M.D.   On: 07/09/2017 14:01   Dg Lumbar Spine 1 View  Result Date: 07/09/2017 CLINICAL DATA:  Pseudoarthrosis  of lumbar spine EXAM: LUMBAR SPINE - 1 VIEW COMPARISON:  05/26/2017 myelogram FINDINGS: Single lateral intraprocedural image shows a spinal needle at the level of the sacrum. There could be an additional more superior needle, but this is not certain based on minimal coverage. Extensive spinal fixation hardware as described on comparison myelogram. IMPRESSION: Intraoperative localization. At least 1 needle is seen posteriorly at the level of the sacrum. Electronically Signed   By: Monte Fantasia M.D.   On: 07/09/2017 09:37   Dg C-arm 1-60 Min  Result Date: 07/09/2017 CLINICAL DATA:  Spinal fusion EXAM: LUMBAR SPINE - 2-3 VIEW; DG C-ARM 61-120 MIN COMPARISON:  Lumbar CT May 26, 2017 FLUOROSCOPY TIME:  2 minutes 8 seconds; 2 acquired images FINDINGS: Frontal and lateral views were obtained. There is posterior screw and plate fixation at L3, L4, L5, and S1 with screws transfixing both sacroiliac joints. There is anterior screw and plate fixation at L5 and S1. No fracture or spondylolisthesis. There is mild disc space narrowing at L5-S1. IMPRESSION: Extensive postoperative change with new screws transfixing the sacroiliac joint regions. No fracture or spondylolisthesis. Electronically Signed   By: Lowella Grip III M.D.   On: 07/09/2017 14:01   Dg C-arm 1-60 Min  Result Date: 07/09/2017 CLINICAL DATA:  Spinal fusion EXAM: LUMBAR SPINE - 2-3 VIEW; DG C-ARM  61-120 MIN COMPARISON:  Lumbar CT May 26, 2017 FLUOROSCOPY TIME:  2 minutes 8 seconds; 2 acquired images FINDINGS: Frontal and lateral views were obtained. There is posterior screw and plate fixation at L3, L4, L5, and S1 with screws transfixing both sacroiliac joints. There is anterior screw and plate fixation at L5 and S1. No fracture or spondylolisthesis. There is mild disc space narrowing at L5-S1. IMPRESSION: Extensive postoperative change with new screws transfixing the sacroiliac joint regions. No fracture or spondylolisthesis. Electronically Signed   By: Lowella Grip III M.D.   On: 07/09/2017 14:01      Subjective: No complaints of rash, chest pain , sob.   Discharge Exam: Vitals:   07/21/17 0007 07/21/17 0605  BP: (!) 126/46 (!) 139/47  Pulse: 61 61  Resp: 16 17  Temp:  98.4 F (36.9 C)  SpO2:  96%   Vitals:   07/20/17 2143 07/20/17 2200 07/21/17 0007 07/21/17 0605  BP: (!) 163/57 (!) 162/76 (!) 126/46 (!) 139/47  Pulse: 68 66 61 61  Resp: 17  16 17   Temp: 99.7 F (37.6 C)   98.4 F (36.9 C)  TempSrc: Oral   Oral  SpO2: 97%   96%  Weight:      Height:        General: Pt is alert, awake, not in acute distress Cardiovascular: RRR, S1/S2 +, no rubs, no gallops Respiratory: CTA bilaterally, no wheezing, no rhonchi Abdominal: Soft, NT, ND, bowel sounds + Extremities: no edema, no cyanosis    The results of significant diagnostics from this hospitalization (including imaging, microbiology, ancillary and laboratory) are listed below for reference.     Microbiology: No results found for this or any previous visit (from the past 240 hour(s)).   Labs: BNP (last 3 results) No results for input(s): BNP in the last 8760 hours. Basic Metabolic Panel: Recent Labs  Lab 07/19/17 1332 07/20/17 0400  NA 135 132*  K 3.3* 4.4  CL 96* 99*  CO2 25 23  GLUCOSE 125* 162*  BUN 16 15  CREATININE 0.96 0.70  CALCIUM 9.2 9.0   Liver Function Tests: No results for  input(s):  AST, ALT, ALKPHOS, BILITOT, PROT, ALBUMIN in the last 168 hours. No results for input(s): LIPASE, AMYLASE in the last 168 hours. No results for input(s): AMMONIA in the last 168 hours. CBC: Recent Labs  Lab 07/19/17 1332 07/20/17 0400  WBC 13.8* 12.8*  NEUTROABS 10.0*  --   HGB 11.1* 10.6*  HCT 33.8* 31.9*  MCV 88.7 88.1  PLT 493* 432*   Cardiac Enzymes: No results for input(s): CKTOTAL, CKMB, CKMBINDEX, TROPONINI in the last 168 hours. BNP: Invalid input(s): POCBNP CBG: No results for input(s): GLUCAP in the last 168 hours. D-Dimer No results for input(s): DDIMER in the last 72 hours. Hgb A1c No results for input(s): HGBA1C in the last 72 hours. Lipid Profile No results for input(s): CHOL, HDL, LDLCALC, TRIG, CHOLHDL, LDLDIRECT in the last 72 hours. Thyroid function studies No results for input(s): TSH, T4TOTAL, T3FREE, THYROIDAB in the last 72 hours.  Invalid input(s): FREET3 Anemia work up No results for input(s): VITAMINB12, FOLATE, FERRITIN, TIBC, IRON, RETICCTPCT in the last 72 hours. Urinalysis    Component Value Date/Time   COLORURINE YELLOW 07/20/2017 1600   APPEARANCEUR CLEAR 07/20/2017 1600   LABSPEC 1.014 07/20/2017 1600   PHURINE 5.0 07/20/2017 1600   GLUCOSEU 150 (A) 07/20/2017 1600   HGBUR NEGATIVE 07/20/2017 1600   BILIRUBINUR NEGATIVE 07/20/2017 1600   KETONESUR NEGATIVE 07/20/2017 1600   PROTEINUR NEGATIVE 07/20/2017 1600   UROBILINOGEN 0.2 04/25/2014 1234   NITRITE NEGATIVE 07/20/2017 1600   LEUKOCYTESUR NEGATIVE 07/20/2017 1600   Sepsis Labs Invalid input(s): PROCALCITONIN,  WBC,  LACTICIDVEN Microbiology No results found for this or any previous visit (from the past 240 hour(s)).   Time coordinating discharge: 35 minutes.  SIGNED:   Hosie Poisson, MD  [facility] 07/21/2017, 10:06 AM Pager   If 7PM-7AM, please contact night-coverage [email or web address] Password [email or web address]

## 2017-07-21 NOTE — Evaluation (Signed)
Physical Therapy Evaluation Patient Details Name: Karen Castillo MRN: [id number] DOB: [birth year] Today's Date: 07/21/2017   History of Present Illness  Karen Castillo is a 77 y.o. female with medical history significant  s/p L5 S1 fusion procedure, hypertension, GERD, OSA s/p CPAP, Bipolar disorder, was discharged to rehab. Pt reports allergic reaction and brought to ED.  Clinical Impression  The patient required extensive assistance for mobility but very willing to mobilize. Able to sit at bed edge with UE support. Tolerated x ~ 2 minutes with c/o pain limiting. Pt admitted with above diagnosis. Pt currently with functional limitations due to the deficits listed below (see PT Problem List). Pt will benefit from skilled PT to increase their independence and safety with mobility to allow discharge to the venue listed below.       Follow Up Recommendations SNF;Supervision/Assistance - 24 hour    Equipment Recommendations  None recommended by PT    Recommendations for Other Services       Precautions / Restrictions Precautions Precautions: Fall;Back Precaution Booklet Issued: Yes (comment) Precaution Comments: Spinal brace is not present at this time Required Braces or Orthoses: Spinal Brace Spinal Brace: Thoracolumbosacral orthotic;Applied in sitting position(per previous encounter.)      Mobility  Bed Mobility   Bed Mobility: Rolling;Sidelying to Sit;Sit to Sidelying Rolling: Total assist;+2 for physical assistance;+2 for safety/equipment Sidelying to sit: Total assist;+2 for physical assistance;+2 for safety/equipment     Sit to sidelying: Total assist;+2 for physical assistance;+2 for safety/equipment General bed mobility comments: Pt complained of pain with each positional change. Pt rolled L with use of bed rail and Mod A, for bed pad change S/L>sit EOB with HOB elevated with max/total A for BLE and Trunk. Pt cued to use RUE to push into bed to A with transfer. Pt able to  scoot hips forward to edge of bed with cueing. Upon return to bed with STEDY lift, Max A x 2 required secondary to pain  Transfers                 General transfer comment: unable, no TLSO nor able due to pain  Ambulation/Gait                Stairs            Wheelchair Mobility    Modified Rankin (Stroke Patients Only)       Balance     Sitting balance-Leahy Scale: Poor Sitting balance - Comments: Pt stable sitting EOB with BUE support on bed. Pt demonstrated decreased stability when lifting either UE of bed                                     Pertinent Vitals/Pain Pain Assessment: 0-10 Pain Score: 10-Worst pain ever Pain Location: both  hips and back Pain Descriptors / Indicators: Constant;Moaning;Guarding Pain Intervention(s): Monitored during session;Premedicated before session;Repositioned    Home Living Family/patient expects to be discharged to:: Skilled nursing facility                      Prior Function Level of Independence: Needs assistance   Gait / Transfers Assistance Needed: unable since surgery  ADL's / Homemaking Assistance Needed: total since surgery        Hand Dominance        Extremity/Trunk Assessment   Upper Extremity Assessment Upper Extremity Assessment: Generalized weakness  Lower Extremity Assessment Lower Extremity Assessment: Difficult to assess due to impaired cognition(unable to stand)       Communication      Cognition Arousal/Alertness: Awake/alert Behavior During Therapy: Anxious;WFL for tasks assessed/performed Overall Cognitive Status: Within Functional Limits for tasks assessed                                        General Comments      Exercises     Assessment/Plan    PT Assessment Patient needs continued PT services  PT Problem List Decreased strength;Decreased range of motion;Decreased activity tolerance;Decreased balance;Decreased  mobility;Decreased knowledge of use of DME;Decreased safety awareness;Decreased knowledge of precautions;Pain;Obesity       PT Treatment Interventions DME instruction;Functional mobility training;Therapeutic activities;Therapeutic exercise;Patient/family education    PT Goals (Current goals can be found in the Care Plan section)  Acute Rehab PT Goals Patient Stated Goal: to get better and go home PT Goal Formulation: With patient Time For Goal Achievement: 08/04/17 Potential to Achieve Goals: Fair    Frequency Min 2X/week   Barriers to discharge        Co-evaluation               AM-PAC PT "6 Clicks" Daily Activity  Outcome Measure Difficulty turning over in bed (including adjusting bedclothes, sheets and blankets)?: Unable Difficulty moving from lying on back to sitting on the side of the bed? : Unable Difficulty sitting down on and standing up from a chair with arms (e.g., wheelchair, bedside commode, etc,.)?: Unable Help needed moving to and from a bed to chair (including a wheelchair)?: Total Help needed walking in hospital room?: Total Help needed climbing 3-5 steps with a railing? : Total 6 Click Score: 6    End of Session   Activity Tolerance: Patient limited by pain Patient left: in bed;with call bell/phone within reach;with family/visitor present Nurse Communication: Mobility status PT Visit Diagnosis: Unsteadiness on feet (R26.81);Muscle weakness (generalized) (M62.81);Difficulty in walking, not elsewhere classified (R26.2);Pain Pain - Right/Left: Right Pain - part of body: Hip    Time: 1348-1410 PT Time Calculation (min) (ACUTE ONLY): 22 min   Charges:   PT Evaluation $PT Eval Moderate Complexity: 1 Mod     PT G CodesTresa Endo PT [phone]   Claretha Cooper 07/21/2017, 3:26 PM

## 2017-07-21 NOTE — Progress Notes (Signed)
CSW met with pt and son re: dc plans. Pt was admitted to [facility] SNF for ST rehab on 07/16/17 (authorized by [facility] after case went to Md-to-Md appeal per chart). Admitted to [facility] 07/19/17 with allergic reaction. Pt and son report plan to return to [facility] at DC. Per [facility], will need new [employer] authorization to re-admit. Pt aware authorization can be initiated by CSW once she has participated in therapy evaluation. Will follow to assist.  See below for complete assessment done 07/15/17 during last admission.  Sharren Bridge, MSW, LCSW Clinical Social Work 07/21/2017 [phone]    Clinical Social Work Assessment  Patient Details  Name: Karen Castillo MRN: [id number] Date of Birth: [birth year]  Date of referral:  07/13/17               Reason for consult:  Facility Placement                 Permission sought to share information with:  Facility Sport and exercise psychologist, Tourist information centre manager, Family Supports Permission granted to share information::  Yes, Verbal Permission Granted             Name::                   Agency::  SNF             Relationship::                Contact Information:     Housing/Transportation Living arrangements for the past 2 months:  Single Family Home Source of Information:  Patient, Adult Children Patient Interpreter Needed:  None Criminal Activity/Legal Involvement Pertinent to Current Situation/Hospitalization:  No - Comment as needed Significant Relationships:  Adult Children Lives with:  Adult Children Do you feel safe going back to the place where you live?  No Need for family participation in patient care:  Yes (Comment)  Care giving concerns:  Pt resides alone and has new impairment that the clinical team is recommending rehabilitative therapies at a SNF.  Social Worker assessment / plan:  CSW met with patient at bedside along with son-n-law to discuss SNF placement. Pt confirmed that she has experience with SNF and has already  investigated SNF placement.  She is interested in [facility] and [place]. CSW explained SNF placement, process, and Insurance Auth needed for placement. Pt gave permission to send to SNF's in [place].  CSW will f/u for PASSR.  Employment status:  Retired Forensic scientist:  Other (Comment Required)([employer]) PT Recommendations:  Not assessed at this time Information / Referral to community resources:  [place]  Patient/Family's Response to care:  Psychologist, prison and probation services of CSW meeting to discuss SNF and agreeable to placement. Patient did not express any issues or concerns.  Patient/Family's Understanding of and Emotional Response to Diagnosis, Current Treatment, and Prognosis:  Patient/family has good understanding of diagnosis and appear to be in good emotional state with disposition to get rehabilitative therapies at a SNF. Pt hopes to desire to improve and return home to her independence. Pt has good family support and will continue to improve once completed rehabilitative therapies. No issues or concerns identifed.  Emotional Assessment Appearance:  Appears stated age Attitude/Demeanor/Rapport:    Affect (typically observed):  Accepting, Adaptable, Calm Orientation:  Oriented to Self, Oriented to Place, Oriented to  Time, Oriented to Situation Alcohol / Substance use:    Psych involvement (Current and /or in the community):  No (Comment)  Discharge Needs  Concerns to be addressed:  Discharge Planning Concerns, Home Safety Concerns, Care Coordination Readmission within the last 30 days:  Yes Current discharge risk:  Physical Impairment Barriers to Discharge:  Continued Medical Work up   [employer] 07/15/2017, 9:00 AM

## 2017-07-21 NOTE — Progress Notes (Signed)
PT Cancellation Note  Patient Details Name: RILYN SCROGGS MRN: [id number] DOB: [birth year]   Cancelled Treatment:    Reason Eval/Treat Not Completed: Pain limiting ability to participate, needs pain medication. Check back later today.   Claretha Cooper 07/21/2017, 12:50 PM

## 2017-07-22 ENCOUNTER — Inpatient Hospital Stay (HOSPITAL_COMMUNITY): Payer: PPO

## 2017-07-22 DIAGNOSIS — R52 Pain, unspecified: Secondary | ICD-10-CM

## 2017-07-22 DIAGNOSIS — M1611 Unilateral primary osteoarthritis, right hip: Secondary | ICD-10-CM | POA: Diagnosis not present

## 2017-07-22 DIAGNOSIS — M6281 Muscle weakness (generalized): Secondary | ICD-10-CM | POA: Diagnosis not present

## 2017-07-22 DIAGNOSIS — M5116 Intervertebral disc disorders with radiculopathy, lumbar region: Secondary | ICD-10-CM | POA: Diagnosis not present

## 2017-07-22 DIAGNOSIS — C50912 Malignant neoplasm of unspecified site of left female breast: Secondary | ICD-10-CM | POA: Diagnosis not present

## 2017-07-22 DIAGNOSIS — M129 Arthropathy, unspecified: Secondary | ICD-10-CM | POA: Diagnosis not present

## 2017-07-22 DIAGNOSIS — K219 Gastro-esophageal reflux disease without esophagitis: Secondary | ICD-10-CM | POA: Diagnosis not present

## 2017-07-22 DIAGNOSIS — R262 Difficulty in walking, not elsewhere classified: Secondary | ICD-10-CM | POA: Diagnosis not present

## 2017-07-22 DIAGNOSIS — I10 Essential (primary) hypertension: Secondary | ICD-10-CM | POA: Diagnosis not present

## 2017-07-22 DIAGNOSIS — F3130 Bipolar disorder, current episode depressed, mild or moderate severity, unspecified: Secondary | ICD-10-CM | POA: Diagnosis not present

## 2017-07-22 DIAGNOSIS — L509 Urticaria, unspecified: Secondary | ICD-10-CM | POA: Diagnosis not present

## 2017-07-22 DIAGNOSIS — G4733 Obstructive sleep apnea (adult) (pediatric): Secondary | ICD-10-CM | POA: Diagnosis not present

## 2017-07-22 DIAGNOSIS — T7840XD Allergy, unspecified, subsequent encounter: Secondary | ICD-10-CM | POA: Diagnosis not present

## 2017-07-22 DIAGNOSIS — M4326 Fusion of spine, lumbar region: Secondary | ICD-10-CM | POA: Diagnosis not present

## 2017-07-22 DIAGNOSIS — T7840XA Allergy, unspecified, initial encounter: Secondary | ICD-10-CM

## 2017-07-22 DIAGNOSIS — C439 Malignant melanoma of skin, unspecified: Secondary | ICD-10-CM | POA: Diagnosis not present

## 2017-07-22 DIAGNOSIS — M5416 Radiculopathy, lumbar region: Secondary | ICD-10-CM | POA: Diagnosis not present

## 2017-07-22 DIAGNOSIS — Z7401 Bed confinement status: Secondary | ICD-10-CM | POA: Diagnosis not present

## 2017-07-22 DIAGNOSIS — R5381 Other malaise: Secondary | ICD-10-CM | POA: Diagnosis not present

## 2017-07-22 DIAGNOSIS — F319 Bipolar disorder, unspecified: Secondary | ICD-10-CM | POA: Diagnosis not present

## 2017-07-22 DIAGNOSIS — M2550 Pain in unspecified joint: Secondary | ICD-10-CM | POA: Diagnosis not present

## 2017-07-22 DIAGNOSIS — G8918 Other acute postprocedural pain: Secondary | ICD-10-CM | POA: Diagnosis not present

## 2017-07-22 DIAGNOSIS — M545 Low back pain: Secondary | ICD-10-CM | POA: Diagnosis not present

## 2017-07-22 MED ORDER — POLYETHYLENE GLYCOL 3350 17 G PO PACK: 17.0000 g | PACK | Freq: Every day | ORAL | 0 refills | Status: DC

## 2017-07-22 MED ORDER — SENNOSIDES-DOCUSATE SODIUM 8.6-50 MG PO TABS: 2.0000 | ORAL_TABLET | Freq: Two times a day (BID) | ORAL | 0 refills | Status: DC

## 2017-07-22 NOTE — Discharge Summary (Signed)
Physician Discharge Summary  Karen Castillo [id number] DOB: February 23, 1941 DOA: 07/19/2017  PCP: Charletta Cousin, MD (Inactive)  Admit date: 07/19/2017 Discharge date: 07/22/2017  Admitted From: SNF  Disposition:  SNF  Recommendations for Outpatient Follow-up:  1. Follow up with PCP in 1-2 weeks 2. Please obtain BMP/CBC in one week Please follow up with neuro surgery as recommended.   Discharge Condition: stable.  CODE STATUS: full code.  Diet recommendation: Heart Healthy   Brief/Interim Summary: Karen Embree Jonesis a 77 y.o.femalewith medical history significant ofchronicback pain, s/p L5 S1 fusion procedure, hypertension, GERD, OSA s/p CPAP, Bipolar disorder, was discharged to rehab post procedure. Pt reports allergic reaction starting with lip swelling, left side of the face, tongue swelling, and erythematous wheels of urticaria on the upper extremities associated With itching, which later progressed to the lower extremities after arrival to ED, despite epinephrine, prednisone and benadryl given in ED.  07/22/17: Complaints of severe right hip pain prior to receiving her pain medication.  X-ray of right hip revealed no acute findings.  No acute events overnight. On the day of discharge, patient was hemodynamically stable.  She will need to follow-up with her PCP post hospitalization.    Discharge Diagnoses:  Active Problems:   Urticaria  Urticaria/ Allergic reaction:  Unclear etiology, could be from the soap vs dermacloud vs percocet.  started IV steroids, IV benadryl and IV pepcid.  The erythema, swelling of the face  and swelling of the lip has resolved   Hypertension:  Well controlled. Resume home meds on discharge.   Recent L5 S1 fusion surgery: Resume pain meds with morphine sulfate and vicodin  for breakthrough pain.  PT evaluation.    Dysuria: Resolved. UA is negative for infection.   Hypokalemia:  Replaced.   Mild normocytic anemia:  Hemoglobin  stable around 11.  Leukocytosis improving.  Possibly reactive.      Discharge Instructions   Allergies as of 07/22/2017      Reactions   Ciprofloxacin Swelling   Tongue swells   Cyclobenzaprine Swelling   Feet swell   Pregabalin Anaphylaxis, Shortness Of Breath, Swelling, Other (See Comments)   Dizziness also   Fluvastatin Other (See Comments)   Leg pain   Meloxicam Swelling   Feet became swollen   Metaxalone Rash, Other (See Comments)   Flushing of the skin and sore throat, too   Pramipexole Nausea And Vomiting   Pravastatin Other (See Comments)   Leg pain   Rosuvastatin Other (See Comments)   Leg pain   Sertraline Other (See Comments)   Sensitive teeth   Statins Other (See Comments)   Leg pain    Amitriptyline Other (See Comments)   Weird feeling all over   Arthrotec [diclofenac-misoprostol] Diarrhea   Augmentin [amoxicillin-pot Clavulanate] Diarrhea   Has patient had a PCN reaction causing immediate rash, facial/tongue/throat swelling, SOB or lightheadedness with hypotension: No Has patient had a PCN reaction causing severe rash involving mucus membranes or skin necrosis: No Has patient had a PCN reaction that required hospitalization: No Has patient had a PCN reaction occurring within the last 10 years: No If all of the above answers are "NO", then may proceed with Cephalosporin use.   Bromfenac Diarrhea, Nausea Only   Cephalexin Diarrhea, Nausea Only   Conj Estrog-medroxyprogest Ace Other (See Comments)   Unsure of reaction type Couldn't tolerate   Diclofenac-misoprostol Nausea Only, Diarrhea   Erythromycin Diarrhea, Nausea Only   Esomeprazole Diarrhea   Esomeprazole Magnesium Nausea Only  Levofloxacin Other (See Comments), Nausea Only   Stomach upset   Methadone Diarrhea   Metoclopramide Diarrhea, Nausea Only   Propoxyphene Diarrhea, Nausea Only   Rofecoxib Diarrhea, Nausea Only   Sulfur Diarrhea, Nausea Only   Tramadol Diarrhea   Zoloft  [sertraline Hcl] Other (See Comments)   Sensitivity with teeth      Medication List    STOP taking these medications   oxyCODONE-acetaminophen 5-325 MG tablet Commonly known as:  PERCOCET/ROXICET     TAKE these medications   ACIDOPHILUS PO Take 1 capsule by mouth daily.   allopurinol 100 MG tablet Commonly known as:  ZYLOPRIM Take 100 mg by mouth daily.   amLODipine 10 MG tablet Commonly known as:  NORVASC Take 10 mg by mouth daily.   aspirin EC 81 MG tablet Take 81 mg by mouth daily.   atenolol 100 MG tablet Commonly known as:  TENORMIN Take 100 mg by mouth at bedtime.   azelastine 0.1 % nasal spray Commonly known as:  ASTELIN Place 1-2 sprays into both nostrils 2 (two) times daily as needed for rhinitis or allergies.   BENEFIBER PO Take 15 g by mouth daily.   CALCIUM+D3 PO Take 1 tablet by mouth daily.   cloNIDine 0.1 MG tablet Commonly known as:  CATAPRES Take 0.1 mg by mouth at bedtime.   COSAMIN DS PO Take 2 tablets by mouth daily.   cycloSPORINE 0.05 % ophthalmic emulsion Commonly known as:  RESTASIS Place 1 drop into both eyes 4 (four) times daily.   diazepam 5 MG tablet Commonly known as:  VALIUM Take 1 tablet (5 mg total) by mouth every 6 (six) hours as needed for muscle spasms (for spasms).   fexofenadine 180 MG tablet Commonly known as:  ALLEGRA Take 180 mg by mouth at bedtime.   Fish Oil 500 MG Caps Take 500 mg by mouth 2 (two) times daily.   GINKGO BILOBA EXTRACT PO Take 1 tablet by mouth daily.   HYDROcodone-acetaminophen 5-325 MG tablet Commonly known as:  NORCO/VICODIN Take 1-2 tablets by mouth every 4 (four) hours as needed for up to 3 days for moderate pain (for breakthrough pain).   lansoprazole 30 MG capsule Commonly known as:  PREVACID Take 30 mg by mouth daily before breakfast. 30 minutes before breakfast   latanoprost 0.005 % ophthalmic solution Commonly known as:  XALATAN Place 1 drop into both eyes at bedtime.    losartan-hydrochlorothiazide 100-25 MG tablet Commonly known as:  HYZAAR Take 1 tablet by mouth daily.   Magnesium 70 MG Caps Take 140 mg by mouth every morning. CRAMP DEFENSE   Melatonin 10 MG Tabs Take 10 mg by mouth at bedtime. (2100)   morphine 30 MG 12 hr tablet Commonly known as:  MS CONTIN Take 1 tablet (30 mg total) by mouth every 12 (twelve) hours for 14 days.   nortriptyline 10 MG capsule Commonly known as:  PAMELOR Take 10 mg by mouth at bedtime.   polyethylene glycol packet Commonly known as:  MIRALAX / GLYCOLAX Take 17 g by mouth daily. Start taking on:  07/23/2017   promethazine 12.5 MG tablet Commonly known as:  PHENERGAN Take 12.5 mg by mouth 2 (two) times daily as needed for nausea or vomiting.   senna-docusate 8.6-50 MG tablet Commonly known as:  Senokot-S Take 2 tablets by mouth 2 (two) times daily.   spironolactone 25 MG tablet Commonly known as:  ALDACTONE Take 25 mg by mouth daily.   timolol 0.5 % ophthalmic  solution Commonly known as:  BETIMOL Place 1 drop into both eyes daily.   traZODone 100 MG tablet Commonly known as:  DESYREL Take 200 mg by mouth at bedtime.   Vitamin D3 2000 units Tabs Take 2,000 Units by mouth daily.       Contact information for follow-up providers    Charletta Cousin, MD Follow up in 2 day(s).   Specialty:  Family Medicine Why:  Call to make an appointment. Contact information: [place], [place] [address] [phone]            Contact information for after-discharge care    [facility] .   Service:  Skilled Nursing Contact information: [address] [address] [phone]                 Allergies  Allergen Reactions  . Ciprofloxacin Swelling    Tongue swells  . Cyclobenzaprine Swelling    Feet swell   . Pregabalin Anaphylaxis, Shortness Of Breath, Swelling and Other (See Comments)    Dizziness also  .  Fluvastatin Other (See Comments)    Leg pain   . Meloxicam Swelling    Feet became swollen  . Metaxalone Rash and Other (See Comments)    Flushing of the skin and sore throat, too  . Pramipexole Nausea And Vomiting  . Pravastatin Other (See Comments)    Leg pain  . Rosuvastatin Other (See Comments)    Leg pain  . Sertraline Other (See Comments)    Sensitive teeth  . Statins Other (See Comments)    Leg pain     . Amitriptyline Other (See Comments)    Weird feeling all over  . Arthrotec [Diclofenac-Misoprostol] Diarrhea  . Augmentin [Amoxicillin-Pot Clavulanate] Diarrhea    Has patient had a PCN reaction causing immediate rash, facial/tongue/throat swelling, SOB or lightheadedness with hypotension: No Has patient had a PCN reaction causing severe rash involving mucus membranes or skin necrosis: No Has patient had a PCN reaction that required hospitalization: No Has patient had a PCN reaction occurring within the last 10 years: No If all of the above answers are "NO", then may proceed with Cephalosporin use.   . Bromfenac Diarrhea and Nausea Only  . Cephalexin Diarrhea and Nausea Only  . Conj Estrog-Medroxyprogest Ace Other (See Comments)    Unsure of reaction type Couldn't tolerate  . Diclofenac-Misoprostol Nausea Only and Diarrhea  . Erythromycin Diarrhea and Nausea Only  . Esomeprazole Diarrhea  . Esomeprazole Magnesium Nausea Only  . Levofloxacin Other (See Comments) and Nausea Only    Stomach upset  . Methadone Diarrhea  . Metoclopramide Diarrhea and Nausea Only  . Propoxyphene Diarrhea and Nausea Only  . Rofecoxib Diarrhea and Nausea Only  . Sulfur Diarrhea and Nausea Only  . Tramadol Diarrhea  . Zoloft [Sertraline Hcl] Other (See Comments)    Sensitivity with teeth    Consultations:  None.    Procedures/Studies: Dg Chest 2 View  Result Date: 07/09/2017 CLINICAL DATA:  Preoperative examination.  No new chest complaints. EXAM: CHEST - 2 VIEW COMPARISON:   08/28/2014 FINDINGS: Shallow inspiration. Heart size and pulmonary vascularity are normal. No airspace disease or consolidation in the lungs. Linear fibrosis in the lung bases. No change since previous study. No blunting of costophrenic angles. No pneumothorax. Mediastinal contours appear intact. Calcification of the aorta. Postoperative changes with posterior fixation of the lower thoracic and upper lumbar spine. Surgical clips in  the right upper quadrant. IMPRESSION: Linear fibrosis in the lung bases similar to previous study. No evidence of active pulmonary disease. Coronary artery calcifications. Electronically Signed   By: Lucienne Capers M.D.   On: 07/09/2017 06:43   Dg Lumbar Spine 2-3 Views  Result Date: 07/09/2017 CLINICAL DATA:  Spinal fusion EXAM: LUMBAR SPINE - 2-3 VIEW; DG C-ARM 61-120 MIN COMPARISON:  Lumbar CT May 26, 2017 FLUOROSCOPY TIME:  2 minutes 8 seconds; 2 acquired images FINDINGS: Frontal and lateral views were obtained. There is posterior screw and plate fixation at L3, L4, L5, and S1 with screws transfixing both sacroiliac joints. There is anterior screw and plate fixation at L5 and S1. No fracture or spondylolisthesis. There is mild disc space narrowing at L5-S1. IMPRESSION: Extensive postoperative change with new screws transfixing the sacroiliac joint regions. No fracture or spondylolisthesis. Electronically Signed   By: Lowella Grip III M.D.   On: 07/09/2017 14:01   Dg Lumbar Spine 1 View  Result Date: 07/09/2017 CLINICAL DATA:  Pseudoarthrosis of lumbar spine EXAM: LUMBAR SPINE - 1 VIEW COMPARISON:  05/26/2017 myelogram FINDINGS: Single lateral intraprocedural image shows a spinal needle at the level of the sacrum. There could be an additional more superior needle, but this is not certain based on minimal coverage. Extensive spinal fixation hardware as described on comparison myelogram. IMPRESSION: Intraoperative localization. At least 1 needle is seen posteriorly at the  level of the sacrum. Electronically Signed   By: Monte Fantasia M.D.   On: 07/09/2017 09:37   Dg C-arm 1-60 Min  Result Date: 07/09/2017 CLINICAL DATA:  Spinal fusion EXAM: LUMBAR SPINE - 2-3 VIEW; DG C-ARM 61-120 MIN COMPARISON:  Lumbar CT May 26, 2017 FLUOROSCOPY TIME:  2 minutes 8 seconds; 2 acquired images FINDINGS: Frontal and lateral views were obtained. There is posterior screw and plate fixation at L3, L4, L5, and S1 with screws transfixing both sacroiliac joints. There is anterior screw and plate fixation at L5 and S1. No fracture or spondylolisthesis. There is mild disc space narrowing at L5-S1. IMPRESSION: Extensive postoperative change with new screws transfixing the sacroiliac joint regions. No fracture or spondylolisthesis. Electronically Signed   By: Lowella Grip III M.D.   On: 07/09/2017 14:01   Dg C-arm 1-60 Min  Result Date: 07/09/2017 CLINICAL DATA:  Spinal fusion EXAM: LUMBAR SPINE - 2-3 VIEW; DG C-ARM 61-120 MIN COMPARISON:  Lumbar CT May 26, 2017 FLUOROSCOPY TIME:  2 minutes 8 seconds; 2 acquired images FINDINGS: Frontal and lateral views were obtained. There is posterior screw and plate fixation at L3, L4, L5, and S1 with screws transfixing both sacroiliac joints. There is anterior screw and plate fixation at L5 and S1. No fracture or spondylolisthesis. There is mild disc space narrowing at L5-S1. IMPRESSION: Extensive postoperative change with new screws transfixing the sacroiliac joint regions. No fracture or spondylolisthesis. Electronically Signed   By: Lowella Grip III M.D.   On: 07/09/2017 14:01   Dg Hip Port Unilat With Pelvis 1v Right  Result Date: 07/22/2017 CLINICAL DATA:  Pelvic pain EXAM: DG HIP (WITH OR WITHOUT PELVIS) 1V PORT RIGHT COMPARISON:  07/09/2017 FINDINGS: Postsurgical changes are noted in the lower lumbar spine extending into the sacrum. The overall appearance is similar to that seen on the intraoperative exam. Mild degenerative changes of the  hip joints are noted bilaterally. No acute fracture is seen. IMPRESSION: Postsurgical changes stable from the previous intraoperative exam. No acute abnormality noted. Electronically Signed   By: Inez Catalina  M.D.   On: 07/22/2017 11:17     Subjective: No complaints of rash, chest pain , sob.   Discharge Exam: Vitals:   07/21/17 2107 07/22/17 0418  BP: (!) 180/58 (!) 155/53  Pulse: 61 (!) 55  Resp: 18 18  Temp: 98.2 F (36.8 C) (!) 97.5 F (36.4 C)  SpO2: 96% 98%   Vitals:   07/21/17 0605 07/21/17 1323 07/21/17 2107 07/22/17 0418  BP: (!) 139/47 (!) 148/42 (!) 180/58 (!) 155/53  Pulse: 61 67 61 (!) 55  Resp: 17 16 18 18   Temp: 98.4 F (36.9 C) 98.2 F (36.8 C) 98.2 F (36.8 C) (!) 97.5 F (36.4 C)  TempSrc: Oral Oral Oral Oral  SpO2: 96% 95% 96% 98%  Weight:      Height:        General: Pt is alert, awake, not in acute distress Cardiovascular: RRR, S1/S2 +, no rubs, no gallops Respiratory: CTA bilaterally, no wheezing, no rhonchi Abdominal: Soft, NT, ND, bowel sounds + Extremities: no edema, no cyanosis    The results of significant diagnostics from this hospitalization (including imaging, microbiology, ancillary and laboratory) are listed below for reference.     Microbiology: No results found for this or any previous visit (from the past 240 hour(s)).   Labs: BNP (last 3 results) No results for input(s): BNP in the last 8760 hours. Basic Metabolic Panel: Recent Labs  Lab 07/19/17 1332 07/20/17 0400  NA 135 132*  K 3.3* 4.4  CL 96* 99*  CO2 25 23  GLUCOSE 125* 162*  BUN 16 15  CREATININE 0.96 0.70  CALCIUM 9.2 9.0   Liver Function Tests: No results for input(s): AST, ALT, ALKPHOS, BILITOT, PROT, ALBUMIN in the last 168 hours. No results for input(s): LIPASE, AMYLASE in the last 168 hours. No results for input(s): AMMONIA in the last 168 hours. CBC: Recent Labs  Lab 07/19/17 1332 07/20/17 0400  WBC 13.8* 12.8*  NEUTROABS 10.0*  --   HGB  11.1* 10.6*  HCT 33.8* 31.9*  MCV 88.7 88.1  PLT 493* 432*   Cardiac Enzymes: No results for input(s): CKTOTAL, CKMB, CKMBINDEX, TROPONINI in the last 168 hours. BNP: Invalid input(s): POCBNP CBG: No results for input(s): GLUCAP in the last 168 hours. D-Dimer No results for input(s): DDIMER in the last 72 hours. Hgb A1c No results for input(s): HGBA1C in the last 72 hours. Lipid Profile No results for input(s): CHOL, HDL, LDLCALC, TRIG, CHOLHDL, LDLDIRECT in the last 72 hours. Thyroid function studies No results for input(s): TSH, T4TOTAL, T3FREE, THYROIDAB in the last 72 hours.  Invalid input(s): FREET3 Anemia work up No results for input(s): VITAMINB12, FOLATE, FERRITIN, TIBC, IRON, RETICCTPCT in the last 72 hours. Urinalysis    Component Value Date/Time   COLORURINE YELLOW 07/20/2017 1600   APPEARANCEUR CLEAR 07/20/2017 1600   LABSPEC 1.014 07/20/2017 1600   PHURINE 5.0 07/20/2017 1600   GLUCOSEU 150 (A) 07/20/2017 1600   HGBUR NEGATIVE 07/20/2017 1600   BILIRUBINUR NEGATIVE 07/20/2017 1600   KETONESUR NEGATIVE 07/20/2017 1600   PROTEINUR NEGATIVE 07/20/2017 1600   UROBILINOGEN 0.2 04/25/2014 1234   NITRITE NEGATIVE 07/20/2017 1600   LEUKOCYTESUR NEGATIVE 07/20/2017 1600   Sepsis Labs Invalid input(s): PROCALCITONIN,  WBC,  LACTICIDVEN Microbiology No results found for this or any previous visit (from the past 240 hour(s)).   Time coordinating discharge: 25 minutes.  SIGNED:   Kayleen Memos, MD  [facility] 07/22/2017, 1:04 PM Pager   If 7PM-7AM, please contact  night-coverage [email or web address] Password [email or web address]

## 2017-07-22 NOTE — Progress Notes (Signed)
Pt. seen for CPAP h/s, worked better last evening after adjustment, able to place on independantly, aware to notify if help needed.

## 2017-07-22 NOTE — Progress Notes (Signed)
Report called to Danae Chen, Therapist, sports at [facility].

## 2017-07-22 NOTE — NC FL2 (Signed)
[place] LEVEL OF CARE SCREENING TOOL     IDENTIFICATION  Patient Name: Karen Castillo Birthdate: 12-27-1940 Sex: female Admission Date (Current Location): 07/19/2017  [facility] and [place] Number:  [place] and Address:  [facility],  [address], [place]      Provider Number: [id number]  Attending Physician Name and Address:  Kayleen Memos, DO  Relative Name and Phone Number:  Tsosie Billing, daughter, [phone]    Current Level of Care: Hospital Recommended Level of Care: [place] Prior Approval Number:    Date Approved/Denied:   PASRR Number: #[id number]   Discharge Plan: SNF    Current Diagnoses: Patient Active Problem List   Diagnosis Date Noted  . Urticaria 07/19/2017  . Acute post-operative pain 07/13/2017  . Intractable low back pain 07/13/2017  . Radiculopathy 05/04/2014    Orientation RESPIRATION BLADDER Height & Weight     Self, Time, Situation, Place  Normal Continent Weight: 236 lb (107 kg) Height:  5\' 4"  (162.6 cm)  BEHAVIORAL SYMPTOMS/MOOD NEUROLOGICAL BOWEL NUTRITION STATUS      Continent Diet(heart healthy)  AMBULATORY STATUS COMMUNICATION OF NEEDS Skin   Extensive Assist Verbally Surgical wounds                       Personal Care Assistance Level of Assistance  Dressing, Bathing, Feeding Bathing Assistance: Maximum assistance Feeding assistance: Independent Dressing Assistance: Maximum assistance     Functional Limitations Info  Sight, Hearing, Speech Sight Info: Adequate Hearing Info: Adequate Speech Info: Adequate    SPECIAL CARE FACTORS FREQUENCY  PT (By licensed PT), OT (By licensed OT)     PT Frequency: 5x OT Frequency: 5x            Contractures Contractures Info: Not present    Additional Factors Info  Code Status, Allergies Code Status Info: full code    Allergies info: Ciprofloxacin, Cyclobenzaprine, Pregabalin, Fluvastatin,  Meloxicam, Metaxalone, Pramipexole, Pravastatin, Rosuvastatin, Sertraline, Statins, Amitriptyline, Arthrotec [Diclofenac-misoprostol], Augmentin [Amoxicillin-pot Clavulanate], Bromfenac, Cephalexin, Conj Estrog-medroxyprogest Ace, Diclofenac-misoprostol, Erythromycin, Esomeprazole, Esomeprazole Magnesium, Levofloxacin, Methadone, Metoclopramide, Propoxyphene, Rofecoxib, Sulfur, Tramadol, Zoloft [Sertraline Hcl]         Current Medications (07/22/2017):  This is the current hospital active medication list Current Facility-Administered Medications  Medication Dose Route Frequency Provider Last Rate Last Dose  . cloNIDine (CATAPRES) tablet 0.1 mg  0.1 mg Oral QHS Hosie Poisson, MD   0.1 mg at 07/21/17 2116  . cycloSPORINE (RESTASIS) 0.05 % ophthalmic emulsion 1 drop  1 drop Both Eyes QID Hosie Poisson, MD   1 drop at 07/22/17 1022  . diazepam (VALIUM) tablet 5 mg  5 mg Oral Q6H PRN Hosie Poisson, MD   5 mg at 07/21/17 2116  . diphenhydrAMINE (BENADRYL) injection 12.5 mg  12.5 mg Intravenous Q6H PRN Hosie Poisson, MD   12.5 mg at 07/22/17 1218  . enoxaparin (LOVENOX) injection 40 mg  40 mg Subcutaneous Q24H Hosie Poisson, MD   40 mg at 07/21/17 2118  . HYDROcodone-acetaminophen (NORCO/VICODIN) 5-325 MG per tablet 1-2 tablet  1-2 tablet Oral Q4H PRN Hosie Poisson, MD   2 tablet at 07/22/17 1021  . latanoprost (XALATAN) 0.005 % ophthalmic solution 1 drop  1 drop Both Eyes QHS Hosie Poisson, MD   1 drop at 07/21/17 2119  . Melatonin TABS 10 mg  10 mg Oral QHS Hosie Poisson, MD   10 mg at 07/21/17 2123  . morphine (MS CONTIN)  12 hr tablet 30 mg  30 mg Oral Q12H Hosie Poisson, MD   30 mg at 07/22/17 1021  . ondansetron (ZOFRAN) injection 4 mg  4 mg Intravenous Q6H PRN Hosie Poisson, MD      . polyethylene glycol (MIRALAX / GLYCOLAX) packet 17 g  17 g Oral Daily Hosie Poisson, MD   17 g at 07/22/17 1021  . senna-docusate (Senokot-S) tablet 2 tablet  2 tablet Oral BID Hosie Poisson, MD   2 tablet at 07/22/17  1021  . timolol (BETIMOL) 0.5 % ophthalmic solution 1 drop  1 drop Both Eyes Daily Hosie Poisson, MD   1 drop at 07/22/17 1023  . traZODone (DESYREL) tablet 200 mg  200 mg Oral QHS Hosie Poisson, MD   200 mg at 07/21/17 2116     Discharge Medications: TAKE these medications   ACIDOPHILUS PO Take 1 capsule by mouth daily.   allopurinol 100 MG tablet Commonly known as:  ZYLOPRIM Take 100 mg by mouth daily.   amLODipine 10 MG tablet Commonly known as:  NORVASC Take 10 mg by mouth daily.   aspirin EC 81 MG tablet Take 81 mg by mouth daily.   atenolol 100 MG tablet Commonly known as:  TENORMIN Take 100 mg by mouth at bedtime.   azelastine 0.1 % nasal spray Commonly known as:  ASTELIN Place 1-2 sprays into both nostrils 2 (two) times daily as needed for rhinitis or allergies.   BENEFIBER PO Take 15 g by mouth daily.   CALCIUM+D3 PO Take 1 tablet by mouth daily.   cloNIDine 0.1 MG tablet Commonly known as:  CATAPRES Take 0.1 mg by mouth at bedtime.   COSAMIN DS PO Take 2 tablets by mouth daily.   cycloSPORINE 0.05 % ophthalmic emulsion Commonly known as:  RESTASIS Place 1 drop into both eyes 4 (four) times daily.   diazepam 5 MG tablet Commonly known as:  VALIUM Take 1 tablet (5 mg total) by mouth every 6 (six) hours as needed for muscle spasms (for spasms).   fexofenadine 180 MG tablet Commonly known as:  ALLEGRA Take 180 mg by mouth at bedtime.   Fish Oil 500 MG Caps Take 500 mg by mouth 2 (two) times daily.   GINKGO BILOBA EXTRACT PO Take 1 tablet by mouth daily.   HYDROcodone-acetaminophen 5-325 MG tablet Commonly known as:  NORCO/VICODIN Take 1-2 tablets by mouth every 4 (four) hours as needed for up to 3 days for moderate pain (for breakthrough pain).   lansoprazole 30 MG capsule Commonly known as:  PREVACID Take 30 mg by mouth daily before breakfast. 30 minutes before breakfast   latanoprost 0.005 % ophthalmic solution Commonly  known as:  XALATAN Place 1 drop into both eyes at bedtime.   losartan-hydrochlorothiazide 100-25 MG tablet Commonly known as:  HYZAAR Take 1 tablet by mouth daily.   Magnesium 70 MG Caps Take 140 mg by mouth every morning. CRAMP DEFENSE   Melatonin 10 MG Tabs Take 10 mg by mouth at bedtime. (2100)   morphine 30 MG 12 hr tablet Commonly known as:  MS CONTIN Take 1 tablet (30 mg total) by mouth every 12 (twelve) hours for 14 days.   nortriptyline 10 MG capsule Commonly known as:  PAMELOR Take 10 mg by mouth at bedtime.   polyethylene glycol packet Commonly known as:  MIRALAX / GLYCOLAX Take 17 g by mouth daily. Start taking on:  07/23/2017   promethazine 12.5 MG tablet Commonly known as:  PHENERGAN Take  12.5 mg by mouth 2 (two) times daily as needed for nausea or vomiting.   senna-docusate 8.6-50 MG tablet Commonly known as:  Senokot-S Take 2 tablets by mouth 2 (two) times daily.   spironolactone 25 MG tablet Commonly known as:  ALDACTONE Take 25 mg by mouth daily.   timolol 0.5 % ophthalmic solution Commonly known as:  BETIMOL Place 1 drop into both eyes daily.   traZODone 100 MG tablet Commonly known as:  DESYREL Take 200 mg by mouth at bedtime.   Vitamin D3 2000 units Tabs Take 2,000 Units by mouth daily.     Relevant Imaging Results:  Relevant Lab Results:   Additional Information SS#: [id number]  [place], LCSW

## 2017-07-22 NOTE — Progress Notes (Signed)
Pt returning to [facility]- report [phone]. [facility] transport arranged. Pt notified her daughter and son. DC information provided via the [place].  Sharren Bridge, MSW, LCSW Clinical Social Work 07/22/2017 [phone]

## 2017-07-22 NOTE — Progress Notes (Signed)
Pt received approved insurance authorization to re-admit to SNF [facility]) 7 days- [phone]. Updated facility and pt, and she informed family. Will assist with transition there at DC.   Sharren Bridge, MSW, LCSW Clinical Social Work 07/22/2017 [phone]

## 2017-07-23 DIAGNOSIS — R262 Difficulty in walking, not elsewhere classified: Secondary | ICD-10-CM | POA: Diagnosis not present

## 2017-07-23 DIAGNOSIS — M6281 Muscle weakness (generalized): Secondary | ICD-10-CM | POA: Diagnosis not present

## 2017-07-23 DIAGNOSIS — M545 Low back pain: Secondary | ICD-10-CM | POA: Diagnosis not present

## 2017-07-23 DIAGNOSIS — R5381 Other malaise: Secondary | ICD-10-CM | POA: Diagnosis not present

## 2017-07-24 ENCOUNTER — Encounter: Payer: Self-pay | Admitting: Gastroenterology

## 2017-07-24 DIAGNOSIS — I10 Essential (primary) hypertension: Secondary | ICD-10-CM | POA: Diagnosis not present

## 2017-07-24 DIAGNOSIS — T7840XD Allergy, unspecified, subsequent encounter: Secondary | ICD-10-CM | POA: Diagnosis not present

## 2017-07-24 DIAGNOSIS — G4733 Obstructive sleep apnea (adult) (pediatric): Secondary | ICD-10-CM | POA: Diagnosis not present

## 2017-07-24 DIAGNOSIS — F3130 Bipolar disorder, current episode depressed, mild or moderate severity, unspecified: Secondary | ICD-10-CM | POA: Diagnosis not present

## 2017-07-28 ENCOUNTER — Emergency Department (HOSPITAL_COMMUNITY)
Admission: EM | Admit: 2017-07-28 | Discharge: 2017-07-28 | Disposition: A | Discharge status: Home / Self Care | Payer: PPO | Attending: Emergency Medicine | Admitting: Emergency Medicine

## 2017-07-28 ENCOUNTER — Encounter (HOSPITAL_COMMUNITY): Payer: Self-pay

## 2017-07-28 DIAGNOSIS — J309 Allergic rhinitis, unspecified: Secondary | ICD-10-CM | POA: Diagnosis not present

## 2017-07-28 DIAGNOSIS — Z85828 Personal history of other malignant neoplasm of skin: Secondary | ICD-10-CM | POA: Insufficient documentation

## 2017-07-28 DIAGNOSIS — R5381 Other malaise: Secondary | ICD-10-CM | POA: Diagnosis not present

## 2017-07-28 DIAGNOSIS — K5900 Constipation, unspecified: Secondary | ICD-10-CM | POA: Diagnosis not present

## 2017-07-28 DIAGNOSIS — Z9889 Other specified postprocedural states: Secondary | ICD-10-CM | POA: Diagnosis not present

## 2017-07-28 DIAGNOSIS — R58 Hemorrhage, not elsewhere classified: Secondary | ICD-10-CM | POA: Diagnosis not present

## 2017-07-28 DIAGNOSIS — M6281 Muscle weakness (generalized): Secondary | ICD-10-CM | POA: Diagnosis not present

## 2017-07-28 DIAGNOSIS — M79604 Pain in right leg: Secondary | ICD-10-CM | POA: Diagnosis present

## 2017-07-28 DIAGNOSIS — R6883 Chills (without fever): Secondary | ICD-10-CM | POA: Diagnosis not present

## 2017-07-28 DIAGNOSIS — R300 Dysuria: Secondary | ICD-10-CM | POA: Diagnosis not present

## 2017-07-28 DIAGNOSIS — M4326 Fusion of spine, lumbar region: Secondary | ICD-10-CM | POA: Diagnosis not present

## 2017-07-28 DIAGNOSIS — Z111 Encounter for screening for respiratory tuberculosis: Secondary | ICD-10-CM | POA: Diagnosis not present

## 2017-07-28 DIAGNOSIS — Z7982 Long term (current) use of aspirin: Secondary | ICD-10-CM | POA: Diagnosis not present

## 2017-07-28 DIAGNOSIS — I10 Essential (primary) hypertension: Secondary | ICD-10-CM | POA: Insufficient documentation

## 2017-07-28 DIAGNOSIS — K219 Gastro-esophageal reflux disease without esophagitis: Secondary | ICD-10-CM | POA: Diagnosis not present

## 2017-07-28 DIAGNOSIS — Z79899 Other long term (current) drug therapy: Secondary | ICD-10-CM | POA: Insufficient documentation

## 2017-07-28 DIAGNOSIS — M2550 Pain in unspecified joint: Secondary | ICD-10-CM | POA: Diagnosis not present

## 2017-07-28 DIAGNOSIS — Z472 Encounter for removal of internal fixation device: Secondary | ICD-10-CM | POA: Diagnosis not present

## 2017-07-28 DIAGNOSIS — Z888 Allergy status to other drugs, medicaments and biological substances status: Secondary | ICD-10-CM | POA: Diagnosis not present

## 2017-07-28 DIAGNOSIS — M5116 Intervertebral disc disorders with radiculopathy, lumbar region: Secondary | ICD-10-CM | POA: Diagnosis not present

## 2017-07-28 DIAGNOSIS — Z885 Allergy status to narcotic agent status: Secondary | ICD-10-CM | POA: Diagnosis not present

## 2017-07-28 DIAGNOSIS — Z881 Allergy status to other antibiotic agents status: Secondary | ICD-10-CM | POA: Diagnosis not present

## 2017-07-28 DIAGNOSIS — Y838 Other surgical procedures as the cause of abnormal reaction of the patient, or of later complication, without mention of misadventure at the time of the procedure: Secondary | ICD-10-CM | POA: Diagnosis present

## 2017-07-28 DIAGNOSIS — R262 Difficulty in walking, not elsewhere classified: Secondary | ICD-10-CM | POA: Diagnosis not present

## 2017-07-28 DIAGNOSIS — M7918 Myalgia, other site: Secondary | ICD-10-CM | POA: Insufficient documentation

## 2017-07-28 DIAGNOSIS — S32049A Unspecified fracture of fourth lumbar vertebra, initial encounter for closed fracture: Secondary | ICD-10-CM | POA: Diagnosis not present

## 2017-07-28 DIAGNOSIS — Z981 Arthrodesis status: Secondary | ICD-10-CM | POA: Diagnosis not present

## 2017-07-28 DIAGNOSIS — Z7401 Bed confinement status: Secondary | ICD-10-CM | POA: Diagnosis not present

## 2017-07-28 DIAGNOSIS — F209 Schizophrenia, unspecified: Secondary | ICD-10-CM | POA: Diagnosis not present

## 2017-07-28 DIAGNOSIS — M545 Low back pain: Secondary | ICD-10-CM | POA: Diagnosis not present

## 2017-07-28 DIAGNOSIS — M25551 Pain in right hip: Secondary | ICD-10-CM | POA: Diagnosis not present

## 2017-07-28 DIAGNOSIS — M960 Pseudarthrosis after fusion or arthrodesis: Secondary | ICD-10-CM | POA: Diagnosis not present

## 2017-07-28 DIAGNOSIS — Z886 Allergy status to analgesic agent status: Secondary | ICD-10-CM | POA: Diagnosis not present

## 2017-07-28 DIAGNOSIS — K5909 Other constipation: Secondary | ICD-10-CM | POA: Diagnosis not present

## 2017-07-28 DIAGNOSIS — M964 Postsurgical lordosis: Secondary | ICD-10-CM | POA: Diagnosis not present

## 2017-07-28 DIAGNOSIS — M8008XD Age-related osteoporosis with current pathological fracture, vertebra(e), subsequent encounter for fracture with routine healing: Secondary | ICD-10-CM | POA: Diagnosis not present

## 2017-07-28 DIAGNOSIS — E878 Other disorders of electrolyte and fluid balance, not elsewhere classified: Secondary | ICD-10-CM | POA: Diagnosis not present

## 2017-07-28 DIAGNOSIS — C50912 Malignant neoplasm of unspecified site of left female breast: Secondary | ICD-10-CM | POA: Diagnosis not present

## 2017-07-28 DIAGNOSIS — R278 Other lack of coordination: Secondary | ICD-10-CM | POA: Diagnosis not present

## 2017-07-28 DIAGNOSIS — Z882 Allergy status to sulfonamides status: Secondary | ICD-10-CM | POA: Diagnosis not present

## 2017-07-28 DIAGNOSIS — D649 Anemia, unspecified: Secondary | ICD-10-CM | POA: Diagnosis not present

## 2017-07-28 DIAGNOSIS — F250 Schizoaffective disorder, bipolar type: Secondary | ICD-10-CM | POA: Diagnosis not present

## 2017-07-28 DIAGNOSIS — F319 Bipolar disorder, unspecified: Secondary | ICD-10-CM | POA: Diagnosis not present

## 2017-07-28 DIAGNOSIS — G894 Chronic pain syndrome: Secondary | ICD-10-CM | POA: Diagnosis not present

## 2017-07-28 DIAGNOSIS — R52 Pain, unspecified: Secondary | ICD-10-CM | POA: Diagnosis not present

## 2017-07-28 DIAGNOSIS — M4327 Fusion of spine, lumbosacral region: Secondary | ICD-10-CM | POA: Diagnosis not present

## 2017-07-28 DIAGNOSIS — S32039A Unspecified fracture of third lumbar vertebra, initial encounter for closed fracture: Secondary | ICD-10-CM | POA: Diagnosis not present

## 2017-07-28 DIAGNOSIS — D62 Acute posthemorrhagic anemia: Secondary | ICD-10-CM | POA: Diagnosis not present

## 2017-07-28 DIAGNOSIS — M109 Gout, unspecified: Secondary | ICD-10-CM | POA: Diagnosis not present

## 2017-07-28 DIAGNOSIS — G4733 Obstructive sleep apnea (adult) (pediatric): Secondary | ICD-10-CM | POA: Diagnosis not present

## 2017-07-28 DIAGNOSIS — L509 Urticaria, unspecified: Secondary | ICD-10-CM | POA: Diagnosis not present

## 2017-07-28 DIAGNOSIS — E876 Hypokalemia: Secondary | ICD-10-CM | POA: Diagnosis not present

## 2017-07-28 DIAGNOSIS — R140 Abdominal distension (gaseous): Secondary | ICD-10-CM | POA: Diagnosis not present

## 2017-07-28 DIAGNOSIS — M546 Pain in thoracic spine: Secondary | ICD-10-CM | POA: Diagnosis not present

## 2017-07-28 DIAGNOSIS — Z4789 Encounter for other orthopedic aftercare: Secondary | ICD-10-CM | POA: Diagnosis not present

## 2017-07-28 DIAGNOSIS — M9669 Fracture of other bone following insertion of orthopedic implant, joint prosthesis, or bone plate: Secondary | ICD-10-CM | POA: Diagnosis not present

## 2017-07-28 DIAGNOSIS — G8929 Other chronic pain: Secondary | ICD-10-CM | POA: Diagnosis not present

## 2017-07-28 DIAGNOSIS — G4700 Insomnia, unspecified: Secondary | ICD-10-CM | POA: Diagnosis not present

## 2017-07-28 DIAGNOSIS — R110 Nausea: Secondary | ICD-10-CM | POA: Diagnosis not present

## 2017-07-28 DIAGNOSIS — R109 Unspecified abdominal pain: Secondary | ICD-10-CM | POA: Diagnosis not present

## 2017-07-28 MED ORDER — ORPHENADRINE CITRATE ER 100 MG PO TB12: 100.0000 mg | ORAL_TABLET | Freq: Two times a day (BID) | ORAL | 0 refills | Status: DC | PRN

## 2017-07-28 NOTE — ED Notes (Addendum)
Pts family voiced concern about pt returning to facility, states that she is not getting her pain medication when it is due. Pt laying in bed in no distress, she states if she calls it takes hours to receive the medication. Family in process of moving pt to new facility at this time.

## 2017-07-28 NOTE — ED Notes (Signed)
[facility] called for transport.  

## 2017-07-28 NOTE — ED Provider Notes (Signed)
[facility] DEPT Provider Note   CSN: [id number] Arrival date & time: 07/28/17  0122  Time seen 03:43 AM   History   Chief Complaint Chief Complaint  Patient presents with  . Hip Pain    HPI Karen Castillo is a 77 y.o. female.  HPI patient states she had back surgery done on May 2 by Dr. Lynann Bologna.  She was discharged home and then readmitted and then was sent to rehab.  She had to come back for an allergic reaction to oxycodone.  She states she has been noticing her legs and hips hurt when she rolls to her sides.  Today the physical therapist was rubbing on her right leg pretty deeply and afterwards she was having a lot of pain.  She got a hydrocodone at 5 PM and 9 PM.  She states the pain got so bad she was shaking all over.  However now the pain is better.  She denies any trauma or falling.  She states before surgery she did have pain that went down into her leg.  She has not had any hip surgery.  PCP Charletta Cousin, MD (Inactive)   Past Medical History:  Diagnosis Date  . Arthritis   . Bipolar disorder ([place])    patient denies  . Cancer (HCC)    SKIN    ,    LEFT BREAST   . GERD (gastroesophageal reflux disease)   . Hypertension   . Sleep apnea    USES CPAP     Patient Active Problem List   Diagnosis Date Noted  . Urticaria 07/19/2017  . Acute post-operative pain 07/13/2017  . Intractable low back pain 07/13/2017  . Radiculopathy 05/04/2014    Past Surgical History:  Procedure Laterality Date  . ABDOMINAL EXPOSURE N/A 05/04/2014   Procedure: ABDOMINAL EXPOSURE;  Surgeon: Serafina Mitchell, MD;  Location: [facility];  Service: Vascular;  Laterality: N/A;  . ANTERIOR LUMBAR FUSION N/A 05/04/2014   Procedure: ANTERIOR LUMBAR FUSION 1 LEVEL;  Surgeon: Sinclair Ship, MD;  Location: [facility];  Service: Orthopedics;  Laterality: N/A;  Lumbar 5-sacrum 1 anterior lumbar interbody fusion with instrumentation and allograft  . BACK SURGERY     2010  ,2015LUMB FUSION, 06/2013 [facility] FUSION  . BREAST SURGERY     LEFT BRREAST BX  . CARPAL TUNNEL RELEASE     1994 RT  . CARPAL TUNNEL RELEASE Bilateral   . CHOLECYSTECTOMY     2004  . COLONOSCOPY W/ BIOPSIES    . ERCP     2009  . LEEP     [id number]  . SKIN CANCER EXCISION     X 4   . TUBAL LIGATION       OB History   None      Home Medications    Prior to Admission medications   Medication Sig Start Date End Date Taking? Authorizing Provider  allopurinol (ZYLOPRIM) 100 MG tablet Take 100 mg by mouth daily.    [provider]  amLODipine (NORVASC) 10 MG tablet Take 10 mg by mouth daily.    [provider]  aspirin EC 81 MG tablet Take 81 mg by mouth daily.    [provider]  atenolol (TENORMIN) 100 MG tablet Take 100 mg by mouth at bedtime.     [provider]  azelastine (ASTELIN) 0.1 % nasal spray Place 1-2 sprays into both nostrils 2 (two) times daily as needed for rhinitis or allergies.  [provider]  Calcium Carb-Cholecalciferol (CALCIUM+D3 PO) Take 1 tablet by mouth daily.     [provider]  Cholecalciferol (VITAMIN D3) 2000 units TABS Take 2,000 Units by mouth daily.    [provider]  cloNIDine (CATAPRES) 0.1 MG tablet Take 0.1 mg by mouth at bedtime.    [provider]  cycloSPORINE (RESTASIS) 0.05 % ophthalmic emulsion Place 1 drop into both eyes 4 (four) times daily.    [provider]  diazepam (VALIUM) 5 MG tablet Take 1 tablet (5 mg total) by mouth every 6 (six) hours as needed for muscle spasms (for spasms). 07/16/17   Phylliss Bob, MD  fexofenadine (ALLEGRA) 180 MG tablet Take 180 mg by mouth at bedtime.    [provider]  Evelina Bucy BILOBA EXTRACT PO Take 1 tablet by mouth daily.     [provider]  Glucosamine-Chondroitin (COSAMIN DS PO) Take 2 tablets by mouth daily.    [provider]  Lactobacillus (ACIDOPHILUS PO) Take 1 capsule by mouth daily.     [provider]  lansoprazole (PREVACID) 30 MG capsule Take 30 mg by mouth daily before breakfast. 30 minutes before breakfast    [provider]  latanoprost (XALATAN) 0.005 % ophthalmic solution Place 1 drop into both eyes at bedtime.    [provider]  losartan-hydrochlorothiazide (HYZAAR) 100-25 MG tablet Take 1 tablet by mouth daily.    [provider]  Magnesium 70 MG CAPS Take 140 mg by mouth every morning. [place]     [provider]  Melatonin 10 MG TABS Take 10 mg by mouth at bedtime. (2100)    [provider]  morphine (MS CONTIN) 30 MG 12 hr tablet Take 1 tablet (30 mg total) by mouth every 12 (twelve) hours for 14 days. 07/16/17 07/30/17  Phylliss Bob, MD  nortriptyline (PAMELOR) 10 MG capsule Take 10 mg by mouth at bedtime.     [provider]  Omega-3 Fatty Acids (FISH OIL) 500 MG CAPS Take 500 mg by mouth 2 (two) times daily.    [provider]  orphenadrine (NORFLEX) 100 MG tablet Take 1 tablet (100 mg total) by mouth 2 (two) times daily as needed for muscle spasms or mild pain. 07/28/17   Rolland Porter, MD  polyethylene glycol (MIRALAX / GLYCOLAX) packet Take 17 g by mouth daily. 07/23/17   Kayleen Memos, DO  promethazine (PHENERGAN) 12.5 MG tablet Take 12.5 mg by mouth 2 (two) times daily as needed for nausea or vomiting.  06/22/17   [provider]  senna-docusate (SENOKOT-S) 8.6-50 MG tablet Take 2 tablets by mouth 2 (two) times daily. 07/22/17   Kayleen Memos, DO  spironolactone (ALDACTONE) 25 MG tablet Take 25 mg by mouth daily.    [provider]  timolol (BETIMOL) 0.5 % ophthalmic solution Place 1 drop into both eyes daily.    [provider]  traZODone (DESYREL) 100 MG tablet Take 200 mg by mouth at bedtime.     [provider]  Wheat Dextrin (BENEFIBER PO) Take 15 g by mouth daily.     [provider]    Family History No family history on  file.  Social History Social History   Tobacco Use  . Smoking status: Never Smoker  . Smokeless tobacco: Never Used  Substance Use Topics  . Alcohol use: No  . Drug use: No  in rehab after back surgery   Allergies   Ciprofloxacin; Cyclobenzaprine; Pregabalin; Fluvastatin;  Meloxicam; Metaxalone; Pramipexole; Pravastatin; Rosuvastatin; Sertraline; Statins; Oxycodone; Amitriptyline; Arthrotec [diclofenac-misoprostol]; Augmentin [amoxicillin-pot clavulanate]; Bromfenac; Cephalexin; Conj estrog-medroxyprogest ace; Diclofenac-misoprostol; Erythromycin; Esomeprazole; Esomeprazole magnesium; Levofloxacin; Methadone; Metoclopramide; Propoxyphene; Rofecoxib; Sulfur; Tramadol; and Zoloft [sertraline hcl]   Review of Systems Review of Systems  All other systems reviewed and are negative.    Physical Exam Updated Vital Signs BP (!) 117/49   Pulse 66   Temp 98.5 F (36.9 C) (Oral)   Resp 16   SpO2 96%   Physical Exam  Constitutional: She is oriented to person, place, and time. She appears well-developed and well-nourished. No distress.  HENT:  Head: Normocephalic and atraumatic.  Right Ear: External ear normal.  Left Ear: External ear normal.  Eyes: Conjunctivae and EOM are normal.  Cardiovascular: Normal rate.  No murmur heard. Pulmonary/Chest: Effort normal. No respiratory distress.  Musculoskeletal: She exhibits tenderness. She exhibits no deformity.  When I asked patient to flex her right knee she briskly flexes her knee without any problem or discomfort.  When she flexes her left knee she states she has discomfort on her right side.  Neurological: She is alert and oriented to person, place, and time. A cranial nerve deficit is present.  Skin: Skin is warm and dry. No erythema.  Psychiatric: She has a normal mood and affect. Her behavior is normal. Thought content normal.  Nursing note and vitals reviewed.    ED Treatments / Results  Labs (all labs ordered are listed, but  only abnormal results are displayed) Labs Reviewed - No data to display  EKG None  Radiology No results found.   CLINICAL DATA:  Pelvic pain  EXAM: DG HIP (WITH OR WITHOUT PELVIS) 1V PORT RIGHT  COMPARISON:  07/09/2017  FINDINGS: Postsurgical changes are noted in the lower lumbar spine extending into the sacrum. The overall appearance is similar to that seen on the intraoperative exam. Mild degenerative changes of the hip joints are noted bilaterally. No acute fracture is seen.  IMPRESSION: Postsurgical changes stable from the previous intraoperative exam. No acute abnormality noted.   Electronically Signed   By: Inez Catalina M.D.   On: 07/22/2017 11:17   Procedures Procedures (including critical care time)  Medications Ordered in ED Medications - No data to display   Initial Impression / Assessment and Plan / ED Course  I have reviewed the triage vital signs and the nursing notes.  Pertinent labs & imaging results that were available during my care of the patient were reviewed by me and considered in my medical decision making (see chart for details).     I was going to x-ray her hip however patient states she had a recent hip x-ray.  She had a x-ray of her right hip and pelvis  on May 15.  She states his pain got worse after having the physical therapy today due to deep pressure in the area.  I am going to send her back to her facility and add a muscle relaxer to her pain regimen.  Final Clinical Impressions(s) / ED Diagnoses   Final diagnoses:  Musculoskeletal pain    ED Discharge Orders        Ordered    orphenadrine (NORFLEX) 100 MG tablet  2 times daily PRN     07/28/17 0426      Plan discharge  Rolland Porter, MD, Barbette Or, MD 07/28/17 [phone]

## 2017-07-28 NOTE — ED Notes (Signed)
Bed: YD28 Expected date:  Expected time:  Means of arrival:  Comments: 77 yo F/SNF-Hip pain

## 2017-07-28 NOTE — ED Triage Notes (Signed)
Pt from [facility] for rehab post back surgery, pt receives pain medication Q4 due at 0100 pt and family preferred to come to hospital.

## 2017-07-28 NOTE — Discharge Instructions (Addendum)
Use the norflex for muscle pain in addition to your hydrocodone. Recheck as needed.

## 2017-08-04 DIAGNOSIS — Z9889 Other specified postprocedural states: Secondary | ICD-10-CM | POA: Diagnosis not present

## 2017-08-08 HISTORY — PX: ANTERIOR LUMBAR FUSION: SHX1170

## 2017-08-09 HISTORY — PX: ESOPHAGOGASTRODUODENOSCOPY: SHX1529

## 2017-08-19 ENCOUNTER — Ambulatory Visit
Admission: RE | Admit: 2017-08-19 | Discharge: 2017-08-19 | Disposition: A | Discharge status: Home / Self Care | Payer: PPO | Source: Ambulatory Visit | Attending: Orthopedic Surgery | Admitting: Orthopedic Surgery

## 2017-08-19 ENCOUNTER — Other Ambulatory Visit: Payer: Self-pay | Admitting: Orthopedic Surgery

## 2017-08-19 DIAGNOSIS — M4326 Fusion of spine, lumbar region: Secondary | ICD-10-CM

## 2017-08-19 DIAGNOSIS — M545 Low back pain: Secondary | ICD-10-CM | POA: Diagnosis not present

## 2017-08-19 DIAGNOSIS — Z9889 Other specified postprocedural states: Secondary | ICD-10-CM | POA: Diagnosis not present

## 2017-08-21 DIAGNOSIS — M545 Low back pain: Secondary | ICD-10-CM | POA: Diagnosis not present

## 2017-08-24 ENCOUNTER — Other Ambulatory Visit: Payer: Self-pay | Admitting: Orthopedic Surgery

## 2017-08-24 ENCOUNTER — Encounter: Payer: Self-pay | Admitting: Orthopedic Surgery

## 2017-08-24 NOTE — Pre-Procedure Instructions (Signed)
Karen Castillo  08/24/2017      [id number] - [place], [place] - [address] [facility]  [id number] Phone: [phone] Fax: [phone]    Your procedure is scheduled on 08/27/2017.  Report to [facility] Admitting at 1000 A.M.  Call this number if you have problems the morning of surgery:  [phone]   Remember:  Do not eat or drink after midnight the night before your surgery.     Take these medicines the morning of surgery with A SIP OF WATER: Allopurinol (Zyloprim) Amlodipine (Norvasc) Atenolol (Tenormin) Azelastine (Astelin) nasal spray - if needed Cyclosporine (Restasis) eye drop Diazepam (Valium) - if needed Lansoprazole (Prevacid) Orphenadrine (Norflex) promethazine (Phenergan) - if needed Timolol (Betimol) eye drop - if needed  7 days prior to surgery STOP taking any Aspirin (unless otherwise instructed by your surgeon), Aleve, Naproxen, Ibuprofen, Motrin, Advil, Goody's, BC's, all herbal medications, fish oil, and all vitamins  Follow your doctors instructions regarding your Aspirin.  If no instructions were given by your doctor, then you will need to call your surgeon's office to get instructions.       Do not wear jewelry, make-up or nail polish.  Do not wear lotions, powders, or perfumes, or deodorant.  Do not shave 48 hours prior to surgery.    Do not bring valuables to the hospital.  [facility] is not responsible for any belongings or valuables.  Hearing aids, eyeglasses, contacts, dentures or bridgework may not be worn into surgery.  Leave your suitcase in the car.  After surgery it may be brought to your room.  For patients admitted to the hospital, discharge time will be determined by your treatment team.  Patients discharged the day of surgery will not be allowed to drive home.   Name and phone number of your driver:    Special instructions:   [facility]- Preparing For Surgery  Before surgery, you can  play an important role. Because skin is not sterile, your skin needs to be as free of germs as possible. You can reduce the number of germs on your skin by washing with CHG (chlorahexidine gluconate) Soap before surgery.  CHG is an antiseptic cleaner which kills germs and bonds with the skin to continue killing germs even after washing.    Oral Hygiene is also important to reduce your risk of infection.  Remember - BRUSH YOUR TEETH THE MORNING OF SURGERY WITH YOUR REGULAR TOOTHPASTE  Please do not use if you have an allergy to CHG or antibacterial soaps. If your skin becomes reddened/irritated stop using the CHG.  Do not shave (including legs and underarms) for at least 48 hours prior to first CHG shower. It is OK to shave your face.  Please follow these instructions carefully.   1. Shower the NIGHT BEFORE SURGERY and the MORNING OF SURGERY with CHG.   2. If you chose to wash your hair, wash your hair first as usual with your normal shampoo.  3. After you shampoo, rinse your hair and body thoroughly to remove the shampoo.  4. Use CHG as you would any other liquid soap. You can apply CHG directly to the skin and wash gently with a scrungie or a clean washcloth.   5. Apply the CHG Soap to your body ONLY FROM THE NECK DOWN.  Do not use on open wounds or open sores. Avoid contact with your eyes, ears, mouth and genitals (private parts). Wash Face and  genitals (private parts)  with your normal soap.  6. Wash thoroughly, paying special attention to the area where your surgery will be performed.  7. Thoroughly rinse your body with warm water from the neck down.  8. DO NOT shower/wash with your normal soap after using and rinsing off the CHG Soap.  9. Pat yourself dry with a CLEAN TOWEL.  10. Wear CLEAN PAJAMAS to bed the night before surgery, wear comfortable clothes the morning of surgery  11. Place CLEAN SHEETS on your bed the night of your first shower and DO NOT SLEEP WITH PETS.    Day  of Surgery: Shower as stated above. Do not apply any deodorants/lotions.  Please wear clean clothes to the hospital/surgery center.   Remember to brush your teeth WITH YOUR REGULAR TOOTHPASTE.    Please read over the following fact sheets that you were given.

## 2017-08-25 ENCOUNTER — Other Ambulatory Visit: Payer: Self-pay

## 2017-08-25 ENCOUNTER — Encounter (HOSPITAL_COMMUNITY): Payer: Self-pay

## 2017-08-25 ENCOUNTER — Encounter (HOSPITAL_COMMUNITY)
Admission: RE | Admit: 2017-08-25 | Discharge: 2017-08-25 | Disposition: A | Discharge status: Home / Self Care | Payer: PPO | Source: Ambulatory Visit | Attending: Orthopedic Surgery | Admitting: Orthopedic Surgery

## 2017-08-25 DIAGNOSIS — K219 Gastro-esophageal reflux disease without esophagitis: Secondary | ICD-10-CM | POA: Diagnosis present

## 2017-08-25 DIAGNOSIS — I10 Essential (primary) hypertension: Secondary | ICD-10-CM | POA: Diagnosis present

## 2017-08-25 DIAGNOSIS — R110 Nausea: Secondary | ICD-10-CM | POA: Diagnosis not present

## 2017-08-25 DIAGNOSIS — R140 Abdominal distension (gaseous): Secondary | ICD-10-CM | POA: Diagnosis not present

## 2017-08-25 DIAGNOSIS — M545 Low back pain: Secondary | ICD-10-CM

## 2017-08-25 DIAGNOSIS — Z7401 Bed confinement status: Secondary | ICD-10-CM | POA: Diagnosis not present

## 2017-08-25 DIAGNOSIS — Z882 Allergy status to sulfonamides status: Secondary | ICD-10-CM | POA: Diagnosis not present

## 2017-08-25 DIAGNOSIS — J309 Allergic rhinitis, unspecified: Secondary | ICD-10-CM | POA: Diagnosis present

## 2017-08-25 DIAGNOSIS — R1111 Vomiting without nausea: Secondary | ICD-10-CM | POA: Diagnosis not present

## 2017-08-25 DIAGNOSIS — M109 Gout, unspecified: Secondary | ICD-10-CM | POA: Diagnosis present

## 2017-08-25 DIAGNOSIS — K5900 Constipation, unspecified: Secondary | ICD-10-CM | POA: Diagnosis present

## 2017-08-25 DIAGNOSIS — F209 Schizophrenia, unspecified: Secondary | ICD-10-CM | POA: Diagnosis present

## 2017-08-25 DIAGNOSIS — M47814 Spondylosis without myelopathy or radiculopathy, thoracic region: Secondary | ICD-10-CM | POA: Diagnosis not present

## 2017-08-25 DIAGNOSIS — G894 Chronic pain syndrome: Secondary | ICD-10-CM | POA: Diagnosis present

## 2017-08-25 DIAGNOSIS — Z01818 Encounter for other preprocedural examination: Secondary | ICD-10-CM

## 2017-08-25 DIAGNOSIS — Z7982 Long term (current) use of aspirin: Secondary | ICD-10-CM

## 2017-08-25 DIAGNOSIS — Z885 Allergy status to narcotic agent status: Secondary | ICD-10-CM | POA: Diagnosis not present

## 2017-08-25 DIAGNOSIS — R1030 Lower abdominal pain, unspecified: Secondary | ICD-10-CM | POA: Diagnosis not present

## 2017-08-25 DIAGNOSIS — Z472 Encounter for removal of internal fixation device: Secondary | ICD-10-CM | POA: Diagnosis not present

## 2017-08-25 DIAGNOSIS — Z79899 Other long term (current) drug therapy: Secondary | ICD-10-CM | POA: Diagnosis not present

## 2017-08-25 DIAGNOSIS — D62 Acute posthemorrhagic anemia: Secondary | ICD-10-CM | POA: Diagnosis not present

## 2017-08-25 DIAGNOSIS — Z881 Allergy status to other antibiotic agents status: Secondary | ICD-10-CM | POA: Diagnosis not present

## 2017-08-25 DIAGNOSIS — Z981 Arthrodesis status: Secondary | ICD-10-CM | POA: Diagnosis not present

## 2017-08-25 DIAGNOSIS — S32038A Other fracture of third lumbar vertebra, initial encounter for closed fracture: Secondary | ICD-10-CM | POA: Diagnosis not present

## 2017-08-25 DIAGNOSIS — S32049A Unspecified fracture of fourth lumbar vertebra, initial encounter for closed fracture: Secondary | ICD-10-CM | POA: Diagnosis not present

## 2017-08-25 DIAGNOSIS — Z888 Allergy status to other drugs, medicaments and biological substances status: Secondary | ICD-10-CM | POA: Diagnosis not present

## 2017-08-25 DIAGNOSIS — M4326 Fusion of spine, lumbar region: Secondary | ICD-10-CM | POA: Diagnosis not present

## 2017-08-25 DIAGNOSIS — Z886 Allergy status to analgesic agent status: Secondary | ICD-10-CM | POA: Diagnosis not present

## 2017-08-25 DIAGNOSIS — S32039A Unspecified fracture of third lumbar vertebra, initial encounter for closed fracture: Secondary | ICD-10-CM | POA: Diagnosis not present

## 2017-08-25 DIAGNOSIS — G4733 Obstructive sleep apnea (adult) (pediatric): Secondary | ICD-10-CM | POA: Diagnosis not present

## 2017-08-25 DIAGNOSIS — T8463XA Infection and inflammatory reaction due to internal fixation device of spine, initial encounter: Secondary | ICD-10-CM | POA: Diagnosis not present

## 2017-08-25 DIAGNOSIS — E876 Hypokalemia: Secondary | ICD-10-CM | POA: Diagnosis not present

## 2017-08-25 DIAGNOSIS — M964 Postsurgical lordosis: Secondary | ICD-10-CM | POA: Diagnosis not present

## 2017-08-25 DIAGNOSIS — K5909 Other constipation: Secondary | ICD-10-CM | POA: Diagnosis present

## 2017-08-25 DIAGNOSIS — G8929 Other chronic pain: Secondary | ICD-10-CM | POA: Diagnosis not present

## 2017-08-25 DIAGNOSIS — G4700 Insomnia, unspecified: Secondary | ICD-10-CM | POA: Diagnosis present

## 2017-08-25 DIAGNOSIS — D500 Iron deficiency anemia secondary to blood loss (chronic): Secondary | ICD-10-CM | POA: Diagnosis not present

## 2017-08-25 DIAGNOSIS — M9669 Fracture of other bone following insertion of orthopedic implant, joint prosthesis, or bone plate: Secondary | ICD-10-CM | POA: Diagnosis present

## 2017-08-25 DIAGNOSIS — R6883 Chills (without fever): Secondary | ICD-10-CM | POA: Diagnosis not present

## 2017-08-25 DIAGNOSIS — M549 Dorsalgia, unspecified: Secondary | ICD-10-CM | POA: Diagnosis not present

## 2017-08-25 DIAGNOSIS — Z743 Need for continuous supervision: Secondary | ICD-10-CM | POA: Diagnosis not present

## 2017-08-25 DIAGNOSIS — E878 Other disorders of electrolyte and fluid balance, not elsewhere classified: Secondary | ICD-10-CM | POA: Diagnosis present

## 2017-08-25 DIAGNOSIS — M960 Pseudarthrosis after fusion or arthrodesis: Secondary | ICD-10-CM | POA: Diagnosis not present

## 2017-08-25 DIAGNOSIS — R109 Unspecified abdominal pain: Secondary | ICD-10-CM | POA: Diagnosis not present

## 2017-08-25 DIAGNOSIS — R58 Hemorrhage, not elsewhere classified: Secondary | ICD-10-CM | POA: Diagnosis not present

## 2017-08-25 DIAGNOSIS — Y838 Other surgical procedures as the cause of abnormal reaction of the patient, or of later complication, without mention of misadventure at the time of the procedure: Secondary | ICD-10-CM | POA: Diagnosis present

## 2017-08-25 DIAGNOSIS — R279 Unspecified lack of coordination: Secondary | ICD-10-CM | POA: Diagnosis not present

## 2017-08-25 DIAGNOSIS — F319 Bipolar disorder, unspecified: Secondary | ICD-10-CM | POA: Diagnosis present

## 2017-08-25 DIAGNOSIS — M2550 Pain in unspecified joint: Secondary | ICD-10-CM | POA: Diagnosis not present

## 2017-08-25 DIAGNOSIS — M8008XD Age-related osteoporosis with current pathological fracture, vertebra(e), subsequent encounter for fracture with routine healing: Secondary | ICD-10-CM | POA: Diagnosis not present

## 2017-08-25 DIAGNOSIS — Z4889 Encounter for other specified surgical aftercare: Secondary | ICD-10-CM | POA: Diagnosis not present

## 2017-08-25 HISTORY — DX: Schizophrenia, unspecified: F20.9

## 2017-08-25 HISTORY — DX: Dysuria: R30.0

## 2017-08-25 HISTORY — DX: Chronic pain syndrome: G89.4

## 2017-08-25 HISTORY — DX: Urticaria, unspecified: L50.9

## 2017-08-25 HISTORY — DX: Insomnia, unspecified: G47.00

## 2017-08-25 HISTORY — DX: Vitamin deficiency, unspecified: E56.9

## 2017-08-25 HISTORY — DX: Anemia, unspecified: D64.9

## 2017-08-25 HISTORY — DX: Other constipation: K59.09

## 2017-08-25 HISTORY — DX: Allergic rhinitis, unspecified: J30.9

## 2017-08-25 HISTORY — DX: Gout, unspecified: M10.9

## 2017-08-25 HISTORY — DX: Dry eye syndrome of bilateral lacrimal glands: H04.123

## 2017-08-25 LAB — COMPREHENSIVE METABOLIC PANEL
ALK PHOS: 130 U/L — AB (ref 38–126)
ALT: 11 U/L — ABNORMAL LOW (ref 14–54)
AST: 19 U/L (ref 15–41)
Albumin: 3.4 g/dL — ABNORMAL LOW (ref 3.5–5.0)
Anion gap: 11 (ref 5–15)
BILIRUBIN TOTAL: 0.2 mg/dL — AB (ref 0.3–1.2)
BUN: 11 mg/dL (ref 6–20)
CALCIUM: 10 mg/dL (ref 8.9–10.3)
CO2: 27 mmol/L (ref 22–32)
Chloride: 96 mmol/L — ABNORMAL LOW (ref 101–111)
Creatinine, Ser: 0.77 mg/dL (ref 0.44–1.00)
Glucose, Bld: 127 mg/dL — ABNORMAL HIGH (ref 65–99)
Potassium: 4.4 mmol/L (ref 3.5–5.1)
Sodium: 134 mmol/L — ABNORMAL LOW (ref 135–145)
TOTAL PROTEIN: 7.7 g/dL (ref 6.5–8.1)

## 2017-08-25 LAB — TYPE AND SCREEN
ABO/RH(D): O POS
Antibody Screen: NEGATIVE

## 2017-08-25 LAB — CBC WITH DIFFERENTIAL/PLATELET
Abs Immature Granulocytes: 0 10*3/uL (ref 0.0–0.1)
BASOS ABS: 0.1 10*3/uL (ref 0.0–0.1)
Basophils Relative: 1 %
EOS PCT: 3 %
Eosinophils Absolute: 0.2 10*3/uL (ref 0.0–0.7)
HEMATOCRIT: 39.3 % (ref 36.0–46.0)
HEMOGLOBIN: 12.5 g/dL (ref 12.0–15.0)
Immature Granulocytes: 0 %
LYMPHS ABS: 2.2 10*3/uL (ref 0.7–4.0)
LYMPHS PCT: 29 %
MCH: 28.1 pg (ref 26.0–34.0)
MCHC: 31.8 g/dL (ref 30.0–36.0)
MCV: 88.3 fL (ref 78.0–100.0)
MONO ABS: 0.5 10*3/uL (ref 0.1–1.0)
Monocytes Relative: 6 %
Neutro Abs: 4.6 10*3/uL (ref 1.7–7.7)
Neutrophils Relative %: 61 %
Platelets: 410 10*3/uL — ABNORMAL HIGH (ref 150–400)
RBC: 4.45 MIL/uL (ref 3.87–5.11)
RDW: 13.4 % (ref 11.5–15.5)
WBC: 7.6 10*3/uL (ref 4.0–10.5)

## 2017-08-25 LAB — APTT: APTT: 30 s (ref 24–36)

## 2017-08-25 LAB — SURGICAL PCR SCREEN
MRSA, PCR: NEGATIVE
Staphylococcus aureus: NEGATIVE

## 2017-08-25 LAB — PROTIME-INR
INR: 0.94
Prothrombin Time: 12.5 seconds (ref 11.4–15.2)

## 2017-08-25 NOTE — Progress Notes (Signed)
PCP - Dr. Melina Fiddler office Cardiologist - patient denies  Chest x-ray - 07/28/2017 EKG - 07/14/2017 Stress Test - patient states she had one 10+ years ago when she lived in [place] because she was having epigastric pain, cardiac rules out and it was GI related ECHO - patient denies Cardiac Cath - patient denies  Sleep Study - 2009 CPAP - yes, instructed to bring mask DOS  Aspirin Instructions: patient stopped a few days ago  Anesthesia review: n/a  Patient denies shortness of breath, fever, cough and chest pain at PAT appointment   Patient verbalized understanding of instructions that were given to them at the PAT appointment. Patient was also instructed that they will need to review over the PAT instructions again at home before surgery.

## 2017-08-26 MED ORDER — SODIUM CHLORIDE 0.9 % IV SOLN
1500.0000 mg | INTRAVENOUS | Status: AC
  Administered 2017-08-27: 1500 mg via INTRAVENOUS
  Filled 2017-08-26: qty 1500

## 2017-08-27 ENCOUNTER — Inpatient Hospital Stay (HOSPITAL_COMMUNITY): Payer: PPO

## 2017-08-27 ENCOUNTER — Inpatient Hospital Stay (HOSPITAL_COMMUNITY)
Admission: RE | Admit: 2017-08-27 | Discharge: 2017-09-04 | DRG: 454 | Disposition: A | Discharge status: Skilled Nursing Facility | Payer: PPO | Attending: Orthopedic Surgery | Admitting: Orthopedic Surgery

## 2017-08-27 ENCOUNTER — Inpatient Hospital Stay (HOSPITAL_COMMUNITY): Payer: PPO | Admitting: Certified Registered"

## 2017-08-27 ENCOUNTER — Encounter (HOSPITAL_COMMUNITY): Payer: Self-pay | Admitting: *Deleted

## 2017-08-27 ENCOUNTER — Encounter (HOSPITAL_COMMUNITY)
Admission: RE | Disposition: A | Discharge status: Skilled Nursing Facility | Payer: Self-pay | Source: Home / Self Care | Attending: Orthopedic Surgery

## 2017-08-27 DIAGNOSIS — M545 Low back pain, unspecified: Secondary | ICD-10-CM

## 2017-08-27 DIAGNOSIS — R6883 Chills (without fever): Secondary | ICD-10-CM | POA: Diagnosis not present

## 2017-08-27 DIAGNOSIS — T8463XA Infection and inflammatory reaction due to internal fixation device of spine, initial encounter: Secondary | ICD-10-CM | POA: Diagnosis not present

## 2017-08-27 DIAGNOSIS — G4733 Obstructive sleep apnea (adult) (pediatric): Secondary | ICD-10-CM | POA: Diagnosis not present

## 2017-08-27 DIAGNOSIS — Z886 Allergy status to analgesic agent status: Secondary | ICD-10-CM | POA: Diagnosis not present

## 2017-08-27 DIAGNOSIS — D62 Acute posthemorrhagic anemia: Secondary | ICD-10-CM | POA: Diagnosis not present

## 2017-08-27 DIAGNOSIS — Z7982 Long term (current) use of aspirin: Secondary | ICD-10-CM

## 2017-08-27 DIAGNOSIS — K5909 Other constipation: Secondary | ICD-10-CM | POA: Diagnosis present

## 2017-08-27 DIAGNOSIS — M109 Gout, unspecified: Secondary | ICD-10-CM | POA: Diagnosis present

## 2017-08-27 DIAGNOSIS — M9669 Fracture of other bone following insertion of orthopedic implant, joint prosthesis, or bone plate: Principal | ICD-10-CM | POA: Diagnosis present

## 2017-08-27 DIAGNOSIS — R58 Hemorrhage, not elsewhere classified: Secondary | ICD-10-CM | POA: Diagnosis not present

## 2017-08-27 DIAGNOSIS — F209 Schizophrenia, unspecified: Secondary | ICD-10-CM | POA: Diagnosis present

## 2017-08-27 DIAGNOSIS — G8918 Other acute postprocedural pain: Secondary | ICD-10-CM

## 2017-08-27 DIAGNOSIS — I10 Essential (primary) hypertension: Secondary | ICD-10-CM | POA: Diagnosis present

## 2017-08-27 DIAGNOSIS — R110 Nausea: Secondary | ICD-10-CM

## 2017-08-27 DIAGNOSIS — R109 Unspecified abdominal pain: Secondary | ICD-10-CM | POA: Diagnosis not present

## 2017-08-27 DIAGNOSIS — M964 Postsurgical lordosis: Secondary | ICD-10-CM | POA: Diagnosis not present

## 2017-08-27 DIAGNOSIS — F319 Bipolar disorder, unspecified: Secondary | ICD-10-CM | POA: Diagnosis present

## 2017-08-27 DIAGNOSIS — Z882 Allergy status to sulfonamides status: Secondary | ICD-10-CM | POA: Diagnosis not present

## 2017-08-27 DIAGNOSIS — E878 Other disorders of electrolyte and fluid balance, not elsewhere classified: Secondary | ICD-10-CM | POA: Diagnosis present

## 2017-08-27 DIAGNOSIS — E876 Hypokalemia: Secondary | ICD-10-CM | POA: Diagnosis not present

## 2017-08-27 DIAGNOSIS — M8008XD Age-related osteoporosis with current pathological fracture, vertebra(e), subsequent encounter for fracture with routine healing: Secondary | ICD-10-CM | POA: Diagnosis not present

## 2017-08-27 DIAGNOSIS — K5900 Constipation, unspecified: Secondary | ICD-10-CM | POA: Diagnosis present

## 2017-08-27 DIAGNOSIS — Y838 Other surgical procedures as the cause of abnormal reaction of the patient, or of later complication, without mention of misadventure at the time of the procedure: Secondary | ICD-10-CM | POA: Diagnosis present

## 2017-08-27 DIAGNOSIS — Z888 Allergy status to other drugs, medicaments and biological substances status: Secondary | ICD-10-CM

## 2017-08-27 DIAGNOSIS — G8929 Other chronic pain: Secondary | ICD-10-CM | POA: Diagnosis not present

## 2017-08-27 DIAGNOSIS — G894 Chronic pain syndrome: Secondary | ICD-10-CM | POA: Diagnosis present

## 2017-08-27 DIAGNOSIS — Z881 Allergy status to other antibiotic agents status: Secondary | ICD-10-CM | POA: Diagnosis not present

## 2017-08-27 DIAGNOSIS — R140 Abdominal distension (gaseous): Secondary | ICD-10-CM | POA: Diagnosis not present

## 2017-08-27 DIAGNOSIS — Z79899 Other long term (current) drug therapy: Secondary | ICD-10-CM

## 2017-08-27 DIAGNOSIS — K219 Gastro-esophageal reflux disease without esophagitis: Secondary | ICD-10-CM | POA: Diagnosis present

## 2017-08-27 DIAGNOSIS — Z885 Allergy status to narcotic agent status: Secondary | ICD-10-CM | POA: Diagnosis not present

## 2017-08-27 DIAGNOSIS — R1030 Lower abdominal pain, unspecified: Secondary | ICD-10-CM | POA: Diagnosis not present

## 2017-08-27 DIAGNOSIS — M4326 Fusion of spine, lumbar region: Secondary | ICD-10-CM | POA: Diagnosis not present

## 2017-08-27 DIAGNOSIS — M869 Osteomyelitis, unspecified: Secondary | ICD-10-CM

## 2017-08-27 DIAGNOSIS — G4700 Insomnia, unspecified: Secondary | ICD-10-CM | POA: Diagnosis present

## 2017-08-27 DIAGNOSIS — J309 Allergic rhinitis, unspecified: Secondary | ICD-10-CM | POA: Diagnosis present

## 2017-08-27 DIAGNOSIS — M4626 Osteomyelitis of vertebra, lumbar region: Secondary | ICD-10-CM

## 2017-08-27 DIAGNOSIS — Z981 Arthrodesis status: Secondary | ICD-10-CM | POA: Diagnosis not present

## 2017-08-27 HISTORY — DX: Low back pain, unspecified: M54.50

## 2017-08-27 SURGERY — POSTERIOR LUMBAR FUSION 1 LEVEL
Anesthesia: General

## 2017-08-27 MED ORDER — ATENOLOL 100 MG PO TABS
100.0000 mg | ORAL_TABLET | Freq: Every day | ORAL | Status: DC
  Administered 2017-08-28 – 2017-09-03 (×7): 100 mg via ORAL
  Filled 2017-08-27 (×8): qty 1

## 2017-08-27 MED ORDER — HYDROMORPHONE HCL 1 MG/ML IJ SOLN
INTRAMUSCULAR | Status: AC
  Filled 2017-08-27: qty 1

## 2017-08-27 MED ORDER — TIMOLOL HEMIHYDRATE 0.5 % OP SOLN: 1.0000 [drp] | Freq: Every day | OPHTHALMIC | Status: DC

## 2017-08-27 MED ORDER — MIDAZOLAM HCL 2 MG/2ML IJ SOLN
INTRAMUSCULAR | Status: AC
  Filled 2017-08-27: qty 2

## 2017-08-27 MED ORDER — NORTRIPTYLINE HCL 10 MG PO CAPS
10.0000 mg | ORAL_CAPSULE | Freq: Every day | ORAL | Status: DC
  Administered 2017-08-27 – 2017-09-03 (×8): 10 mg via ORAL
  Filled 2017-08-27 (×9): qty 1

## 2017-08-27 MED ORDER — SPIRONOLACTONE 25 MG PO TABS
25.0000 mg | ORAL_TABLET | Freq: Every day | ORAL | Status: DC
  Administered 2017-08-27 – 2017-09-04 (×8): 25 mg via ORAL
  Filled 2017-08-27 (×10): qty 1

## 2017-08-27 MED ORDER — HYDROCHLOROTHIAZIDE 25 MG PO TABS
25.0000 mg | ORAL_TABLET | Freq: Every day | ORAL | Status: DC
  Administered 2017-08-27 – 2017-09-04 (×8): 25 mg via ORAL
  Filled 2017-08-27 (×10): qty 1

## 2017-08-27 MED ORDER — CLONIDINE HCL 0.1 MG PO TABS
0.1000 mg | ORAL_TABLET | Freq: Every day | ORAL | Status: DC
  Administered 2017-08-29 – 2017-09-03 (×6): 0.1 mg via ORAL
  Filled 2017-08-27 (×8): qty 1

## 2017-08-27 MED ORDER — POVIDONE-IODINE 7.5 % EX SOLN: Freq: Once | CUTANEOUS | Status: DC

## 2017-08-27 MED ORDER — BENEFIBER PO POWD: 1.0000 | Freq: Every day | ORAL | Status: DC

## 2017-08-27 MED ORDER — LOSARTAN POTASSIUM 50 MG PO TABS
100.0000 mg | ORAL_TABLET | Freq: Every day | ORAL | Status: DC
  Administered 2017-08-27 – 2017-09-04 (×6): 100 mg via ORAL
  Filled 2017-08-27 (×9): qty 2

## 2017-08-27 MED ORDER — FENTANYL CITRATE (PF) 100 MCG/2ML IJ SOLN
INTRAMUSCULAR | Status: AC
  Filled 2017-08-27: qty 2

## 2017-08-27 MED ORDER — CALCIUM CARB-CHOLECALCIFEROL 600-800 MG-UNIT PO TABS: ORAL_TABLET | Freq: Every day | ORAL | Status: DC

## 2017-08-27 MED ORDER — PROPOFOL 10 MG/ML IV BOLUS
INTRAVENOUS | Status: DC | PRN
  Administered 2017-08-27: 100 mg via INTRAVENOUS

## 2017-08-27 MED ORDER — LIDOCAINE 2% (20 MG/ML) 5 ML SYRINGE
INTRAMUSCULAR | Status: AC
  Filled 2017-08-27: qty 5

## 2017-08-27 MED ORDER — CALCIUM CARBONATE-VITAMIN D 500-200 MG-UNIT PO TABS
1.0000 | ORAL_TABLET | Freq: Every day | ORAL | Status: DC
  Administered 2017-08-28 – 2017-09-04 (×8): 1 via ORAL
  Filled 2017-08-27 (×8): qty 1

## 2017-08-27 MED ORDER — LORATADINE 10 MG PO TABS: 10.0000 mg | ORAL_TABLET | Freq: Every day | ORAL | Status: DC

## 2017-08-27 MED ORDER — VANCOMYCIN HCL 1000 MG IV SOLR
INTRAVENOUS | Status: AC
  Filled 2017-08-27: qty 1000

## 2017-08-27 MED ORDER — SODIUM CHLORIDE 0.9 % IV SOLN
INTRAVENOUS | Status: DC | PRN
  Administered 2017-08-27: 500 mL

## 2017-08-27 MED ORDER — FENTANYL CITRATE (PF) 250 MCG/5ML IJ SOLN
INTRAMUSCULAR | Status: AC
  Filled 2017-08-27: qty 5

## 2017-08-27 MED ORDER — PSYLLIUM 95 % PO PACK
1.0000 | PACK | Freq: Every day | ORAL | Status: DC
  Administered 2017-08-30 – 2017-09-04 (×4): 1 via ORAL
  Filled 2017-08-27 (×8): qty 1

## 2017-08-27 MED ORDER — LIDOCAINE 2% (20 MG/ML) 5 ML SYRINGE
INTRAMUSCULAR | Status: DC | PRN
  Administered 2017-08-27: 80 mg via INTRAVENOUS

## 2017-08-27 MED ORDER — SUGAMMADEX SODIUM 200 MG/2ML IV SOLN
INTRAVENOUS | Status: DC | PRN
  Administered 2017-08-27: 200 mg via INTRAVENOUS

## 2017-08-27 MED ORDER — FENTANYL CITRATE (PF) 100 MCG/2ML IJ SOLN
25.0000 ug | INTRAMUSCULAR | Status: DC | PRN
  Administered 2017-08-27 (×3): 50 ug via INTRAVENOUS

## 2017-08-27 MED ORDER — ONDANSETRON HCL 4 MG/2ML IJ SOLN
INTRAMUSCULAR | Status: DC | PRN
  Administered 2017-08-27: 4 mg via INTRAVENOUS

## 2017-08-27 MED ORDER — AZELASTINE HCL 0.1 % NA SOLN
1.0000 | Freq: Two times a day (BID) | NASAL | Status: DC | PRN
  Filled 2017-08-27: qty 30

## 2017-08-27 MED ORDER — VITAMIN D 1000 UNITS PO TABS
2000.0000 [IU] | ORAL_TABLET | Freq: Every day | ORAL | Status: DC
  Administered 2017-08-27 – 2017-09-04 (×9): 2000 [IU] via ORAL
  Filled 2017-08-27 (×12): qty 2

## 2017-08-27 MED ORDER — 0.9 % SODIUM CHLORIDE (POUR BTL) OPTIME
TOPICAL | Status: DC | PRN
  Administered 2017-08-27: 1000 mL

## 2017-08-27 MED ORDER — CYCLOSPORINE 0.05 % OP EMUL: 1.0000 [drp] | Freq: Four times a day (QID) | OPHTHALMIC | Status: DC

## 2017-08-27 MED ORDER — VANCOMYCIN HCL 1000 MG IV SOLR: INTRAVENOUS | Status: DC | PRN

## 2017-08-27 MED ORDER — VANCOMYCIN HCL 1000 MG IV SOLR
INTRAVENOUS | Status: DC | PRN
  Administered 2017-08-27: 1000 mg via TOPICAL

## 2017-08-27 MED ORDER — VANCOMYCIN HCL IN DEXTROSE 750-5 MG/150ML-% IV SOLN
750.0000 mg | Freq: Two times a day (BID) | INTRAVENOUS | Status: DC
  Administered 2017-08-28 – 2017-09-03 (×14): 750 mg via INTRAVENOUS
  Filled 2017-08-27 (×16): qty 150

## 2017-08-27 MED ORDER — TRAZODONE HCL 100 MG PO TABS
200.0000 mg | ORAL_TABLET | Freq: Every day | ORAL | Status: DC
  Administered 2017-08-27 – 2017-09-03 (×8): 200 mg via ORAL
  Filled 2017-08-27 (×8): qty 2

## 2017-08-27 MED ORDER — BUPIVACAINE LIPOSOME 1.3 % IJ SUSP
INTRAMUSCULAR | Status: DC | PRN
  Administered 2017-08-27: 20 mL

## 2017-08-27 MED ORDER — BUPIVACAINE-EPINEPHRINE (PF) 0.25% -1:200000 IJ SOLN
INTRAMUSCULAR | Status: AC
  Filled 2017-08-27: qty 30

## 2017-08-27 MED ORDER — SENNOSIDES-DOCUSATE SODIUM 8.6-50 MG PO TABS
2.0000 | ORAL_TABLET | Freq: Two times a day (BID) | ORAL | Status: DC
  Administered 2017-08-29 – 2017-09-04 (×10): 2 via ORAL
  Filled 2017-08-27 (×15): qty 2

## 2017-08-27 MED ORDER — BUPIVACAINE-EPINEPHRINE 0.25% -1:200000 IJ SOLN
INTRAMUSCULAR | Status: DC | PRN
  Administered 2017-08-27: 30 mL

## 2017-08-27 MED ORDER — THROMBIN 20000 UNITS EX SOLR
CUTANEOUS | Status: DC | PRN
  Administered 2017-08-27: 20000 [IU] via TOPICAL

## 2017-08-27 MED ORDER — TIMOLOL MALEATE 0.5 % OP SOLN
1.0000 [drp] | Freq: Every day | OPHTHALMIC | Status: DC
  Administered 2017-08-28 – 2017-09-04 (×8): 1 [drp] via OPHTHALMIC
  Filled 2017-08-27 (×2): qty 5

## 2017-08-27 MED ORDER — BACITRACIN ZINC 500 UNIT/GM EX OINT
TOPICAL_OINTMENT | CUTANEOUS | Status: AC
  Filled 2017-08-27: qty 28.35

## 2017-08-27 MED ORDER — HYDROMORPHONE HCL 1 MG/ML IJ SOLN
0.2500 mg | INTRAMUSCULAR | Status: DC | PRN
  Administered 2017-08-27 (×4): 0.5 mg via INTRAVENOUS

## 2017-08-27 MED ORDER — POLYETHYLENE GLYCOL 3350 17 G PO PACK
17.0000 g | PACK | Freq: Every day | ORAL | Status: DC
  Administered 2017-08-29 – 2017-09-02 (×5): 17 g via ORAL
  Filled 2017-08-27 (×8): qty 1

## 2017-08-27 MED ORDER — LOSARTAN POTASSIUM-HCTZ 100-25 MG PO TABS: 1.0000 | ORAL_TABLET | Freq: Every day | ORAL | Status: DC

## 2017-08-27 MED ORDER — THROMBIN 20000 UNITS EX SOLR
CUTANEOUS | Status: AC
  Filled 2017-08-27: qty 20000

## 2017-08-27 MED ORDER — MIDAZOLAM HCL 5 MG/5ML IJ SOLN
INTRAMUSCULAR | Status: DC | PRN
  Administered 2017-08-27: 2 mg via INTRAVENOUS

## 2017-08-27 MED ORDER — LATANOPROST 0.005 % OP SOLN
1.0000 [drp] | Freq: Every day | OPHTHALMIC | Status: DC
  Administered 2017-08-27 – 2017-09-03 (×8): 1 [drp] via OPHTHALMIC
  Filled 2017-08-27: qty 2.5

## 2017-08-27 MED ORDER — AMLODIPINE BESYLATE 10 MG PO TABS
10.0000 mg | ORAL_TABLET | Freq: Every day | ORAL | Status: DC
  Administered 2017-08-30 – 2017-09-04 (×6): 10 mg via ORAL
  Filled 2017-08-27 (×8): qty 1

## 2017-08-27 MED ORDER — HYDROMORPHONE HCL 2 MG PO TABS
1.0000 mg | ORAL_TABLET | ORAL | Status: DC | PRN
  Administered 2017-08-28 – 2017-09-04 (×27): 2 mg via ORAL
  Filled 2017-08-27 (×29): qty 1

## 2017-08-27 MED ORDER — POLYMYXIN B SULFATE 500000 UNITS IJ SOLR
INTRAMUSCULAR | Status: AC
  Filled 2017-08-27: qty 500000

## 2017-08-27 MED ORDER — DEXAMETHASONE SODIUM PHOSPHATE 10 MG/ML IJ SOLN
INTRAMUSCULAR | Status: DC | PRN
  Administered 2017-08-27: 10 mg via INTRAVENOUS

## 2017-08-27 MED ORDER — MELATONIN 3 MG PO TABS
9.0000 mg | ORAL_TABLET | Freq: Every day | ORAL | Status: DC
  Administered 2017-08-27 – 2017-09-03 (×8): 9 mg via ORAL
  Filled 2017-08-27 (×9): qty 3

## 2017-08-27 MED ORDER — ALLOPURINOL 100 MG PO TABS
100.0000 mg | ORAL_TABLET | Freq: Every day | ORAL | Status: DC
  Administered 2017-08-28 – 2017-09-04 (×8): 100 mg via ORAL
  Filled 2017-08-27 (×8): qty 1

## 2017-08-27 MED ORDER — ROCURONIUM BROMIDE 10 MG/ML (PF) SYRINGE
PREFILLED_SYRINGE | INTRAVENOUS | Status: DC | PRN
  Administered 2017-08-27 (×2): 20 mg via INTRAVENOUS
  Administered 2017-08-27: 40 mg via INTRAVENOUS

## 2017-08-27 MED ORDER — PROMETHAZINE HCL 12.5 MG PO TABS: 12.5000 mg | ORAL_TABLET | Freq: Two times a day (BID) | ORAL | Status: DC | PRN

## 2017-08-27 MED ORDER — BUPIVACAINE LIPOSOME 1.3 % IJ SUSP
20.0000 mL | INTRAMUSCULAR | Status: AC
  Filled 2017-08-27: qty 20

## 2017-08-27 MED ORDER — PHENYLEPHRINE HCL 10 MG/ML IJ SOLN
INTRAMUSCULAR | Status: DC | PRN
  Administered 2017-08-27: 40 ug/min via INTRAVENOUS

## 2017-08-27 MED ORDER — ONDANSETRON HCL 4 MG/2ML IJ SOLN: 4.0000 mg | Freq: Once | INTRAMUSCULAR | Status: DC | PRN

## 2017-08-27 MED ORDER — FENTANYL CITRATE (PF) 100 MCG/2ML IJ SOLN
INTRAMUSCULAR | Status: DC | PRN
  Administered 2017-08-27 (×2): 50 ug via INTRAVENOUS
  Administered 2017-08-27: 100 ug via INTRAVENOUS
  Administered 2017-08-27 (×3): 50 ug via INTRAVENOUS

## 2017-08-27 MED ORDER — LACTATED RINGERS IV SOLN
INTRAVENOUS | Status: DC
  Administered 2017-08-27 – 2017-08-28 (×3): via INTRAVENOUS

## 2017-08-27 MED ORDER — PROPOFOL 500 MG/50ML IV EMUL
INTRAVENOUS | Status: DC | PRN
  Administered 2017-08-27: 100 ug/kg/min via INTRAVENOUS

## 2017-08-27 MED ORDER — PANTOPRAZOLE SODIUM 40 MG PO TBEC
40.0000 mg | DELAYED_RELEASE_TABLET | Freq: Every day | ORAL | Status: DC
  Administered 2017-08-28 – 2017-09-04 (×8): 40 mg via ORAL
  Filled 2017-08-27 (×9): qty 1

## 2017-08-27 SURGICAL SUPPLY — 88 items
BENZOIN TINCTURE PRP APPL 2/3 (GAUZE/BANDAGES/DRESSINGS) ×3 IMPLANT
BLADE CLIPPER SURG (BLADE) IMPLANT
BONE VIVIGEN FORMABLE 1.3CC (Bone Implant) ×3 IMPLANT
BONE VIVIGEN FORMABLE 5.4CC (Bone Implant) ×3 IMPLANT
BUR PRESCISION 1.7 ELITE (BURR) ×3 IMPLANT
BUR ROUND PRECISION 4.0 (BURR) IMPLANT
BUR ROUND PRECISION 4.0MM (BURR)
BUR SABER RD CUTTING 3.0 (BURR) IMPLANT
BUR SABER RD CUTTING 3.0MM (BURR)
CARTRIDGE OIL MAESTRO DRILL (MISCELLANEOUS) ×1 IMPLANT
CLOSURE STERI-STRIP 1/2X4 (GAUZE/BANDAGES/DRESSINGS) ×1
CLOSURE WOUND 1/2 X4 (GAUZE/BANDAGES/DRESSINGS) ×2
CLSR STERI-STRIP ANTIMIC 1/2X4 (GAUZE/BANDAGES/DRESSINGS) ×2 IMPLANT
CONNECTOR EXPEDIUM SFX SZ A6 (Orthopedic Implant) ×3 IMPLANT
CONT SPEC 4OZ CLIKSEAL STRL BL (MISCELLANEOUS) ×3 IMPLANT
COVER MAYO STAND STRL (DRAPES) ×6 IMPLANT
COVER SURGICAL LIGHT HANDLE (MISCELLANEOUS) ×3 IMPLANT
DIFFUSER DRILL AIR PNEUMATIC (MISCELLANEOUS) ×3 IMPLANT
DRAIN CHANNEL 15F RND FF W/TCR (WOUND CARE) IMPLANT
DRAIN CHANNEL 19F RND (DRAIN) ×3 IMPLANT
DRAPE C-ARM 42X72 X-RAY (DRAPES) ×3 IMPLANT
DRAPE C-ARMOR (DRAPES) IMPLANT
DRAPE POUCH INSTRU U-SHP 10X18 (DRAPES) ×3 IMPLANT
DRAPE SURG 17X23 STRL (DRAPES) ×12 IMPLANT
DURAPREP 26ML APPLICATOR (WOUND CARE) ×3 IMPLANT
ELECT BLADE 4.0 EZ CLEAN MEGAD (MISCELLANEOUS) ×3
ELECT CAUTERY BLADE 6.4 (BLADE) ×3 IMPLANT
ELECT REM PT RETURN 9FT ADLT (ELECTROSURGICAL) ×3
ELECTRODE BLDE 4.0 EZ CLN MEGD (MISCELLANEOUS) ×1 IMPLANT
ELECTRODE REM PT RTRN 9FT ADLT (ELECTROSURGICAL) ×1 IMPLANT
EVACUATOR SILICONE 100CC (DRAIN) IMPLANT
FEE INTRAOP MONITOR IMPULS NCS (MISCELLANEOUS) ×1 IMPLANT
GAUZE SPONGE 4X4 12PLY STRL (GAUZE/BANDAGES/DRESSINGS) ×3 IMPLANT
GAUZE SPONGE 4X4 16PLY XRAY LF (GAUZE/BANDAGES/DRESSINGS) ×3 IMPLANT
GLOVE BIO SURGEON STRL SZ7 (GLOVE) ×3 IMPLANT
GLOVE BIO SURGEON STRL SZ8 (GLOVE) ×3 IMPLANT
GLOVE BIOGEL PI IND STRL 7.0 (GLOVE) ×1 IMPLANT
GLOVE BIOGEL PI IND STRL 8 (GLOVE) ×1 IMPLANT
GLOVE BIOGEL PI INDICATOR 7.0 (GLOVE) ×2
GLOVE BIOGEL PI INDICATOR 8 (GLOVE) ×2
GOWN STRL REUS W/ TWL LRG LVL3 (GOWN DISPOSABLE) ×2 IMPLANT
GOWN STRL REUS W/ TWL XL LVL3 (GOWN DISPOSABLE) ×1 IMPLANT
GOWN STRL REUS W/TWL LRG LVL3 (GOWN DISPOSABLE) ×4
GOWN STRL REUS W/TWL XL LVL3 (GOWN DISPOSABLE) ×2
INTRAOP MONITOR FEE IMPULS NCS (MISCELLANEOUS) ×1
INTRAOP MONITOR FEE IMPULSE (MISCELLANEOUS) ×2
IV CATH 14GX2 1/4 (CATHETERS) ×3 IMPLANT
KIT BASIN OR (CUSTOM PROCEDURE TRAY) ×3 IMPLANT
KIT POSITION SURG JACKSON T1 (MISCELLANEOUS) ×3 IMPLANT
KIT TURNOVER KIT B (KITS) ×3 IMPLANT
MARKER SKIN DUAL TIP RULER LAB (MISCELLANEOUS) ×3 IMPLANT
NDL SAFETY ECLIPSE 18X1.5 (NEEDLE) ×1 IMPLANT
NEEDLE 22X1 1/2 (OR ONLY) (NEEDLE) ×6 IMPLANT
NEEDLE HYPO 18GX1.5 SHARP (NEEDLE) ×2
NEEDLE HYPO 25GX1X1/2 BEV (NEEDLE) ×3 IMPLANT
NEEDLE SPNL 18GX3.5 QUINCKE PK (NEEDLE) ×6 IMPLANT
NS IRRIG 1000ML POUR BTL (IV SOLUTION) ×3 IMPLANT
OIL CARTRIDGE MAESTRO DRILL (MISCELLANEOUS) ×3
PACK LAMINECTOMY ORTHO (CUSTOM PROCEDURE TRAY) ×3 IMPLANT
PACK UNIVERSAL I (CUSTOM PROCEDURE TRAY) ×3 IMPLANT
PAD ARMBOARD 7.5X6 YLW CONV (MISCELLANEOUS) ×6 IMPLANT
PATTIES SURGICAL .5 X1 (DISPOSABLE) ×3 IMPLANT
PATTIES SURGICAL .5X1.5 (GAUZE/BANDAGES/DRESSINGS) ×3 IMPLANT
PROBE PEDCLE PROBE MAGSTM DISP (MISCELLANEOUS) ×3 IMPLANT
ROD EXPEDIUM 480MM (Rod) ×6 IMPLANT
SCREW EXPEDIUM PA ST 10X55 (Screw) ×3 IMPLANT
SCREW EXPEDIUM PA ST 9X65 (Screw) ×3 IMPLANT
SCREW POLY EXP 7X50MM (Screw) ×6 IMPLANT
SCREW POLYAXIAL 7X45MM (Screw) ×9 IMPLANT
SCREW SET SINGLE INNER (Screw) ×42 IMPLANT
SPONGE INTESTINAL PEANUT (DISPOSABLE) ×3 IMPLANT
SPONGE SURGIFOAM ABS GEL 100 (HEMOSTASIS) ×3 IMPLANT
STRIP CLOSURE SKIN 1/2X4 (GAUZE/BANDAGES/DRESSINGS) ×4 IMPLANT
SURGIFLO W/THROMBIN 8M KIT (HEMOSTASIS) IMPLANT
SUT MNCRL AB 4-0 PS2 18 (SUTURE) ×3 IMPLANT
SUT VIC AB 0 CT1 18XCR BRD 8 (SUTURE) ×1 IMPLANT
SUT VIC AB 0 CT1 8-18 (SUTURE) ×2
SUT VIC AB 1 CT1 18XCR BRD 8 (SUTURE) ×4 IMPLANT
SUT VIC AB 1 CT1 8-18 (SUTURE) ×8
SUT VIC AB 2-0 CT2 18 VCP726D (SUTURE) ×9 IMPLANT
SYR 20CC LL (SYRINGE) ×6 IMPLANT
SYR BULB IRRIGATION 50ML (SYRINGE) ×3 IMPLANT
SYR CONTROL 10ML LL (SYRINGE) ×6 IMPLANT
SYR TB 1ML LUER SLIP (SYRINGE) ×3 IMPLANT
TAPE CLOTH SURG 6X10 WHT LF (GAUZE/BANDAGES/DRESSINGS) ×3 IMPLANT
TRAY FOLEY MTR SLVR 16FR STAT (SET/KITS/TRAYS/PACK) ×3 IMPLANT
WATER STERILE IRR 1000ML POUR (IV SOLUTION) ×3 IMPLANT
YANKAUER SUCT BULB TIP NO VENT (SUCTIONS) ×3 IMPLANT

## 2017-08-27 NOTE — Op Note (Signed)
NAME: Karen Castillo, Karen Castillo MEDICAL RECORD JJ:[id number] DATE OF BIRTH:06-14-1940 FACILITY: [facility], [place]  OPERATIVE REPORT  DATE OF PROCEDURE:  08/27/2017  PREOPERATIVE DIAGNOSES: 1.  Fracture across the L3-4 level, extending through the L3 vertebral body and across the posterior elements at the level of an L3-4 previous fusion. 2.  Status post revision L5-S1 lumbar fusion approximately 7 weeks ago.  POSTOPERATIVE DIAGNOSES:   1.  Fracture across the L3-4 level, extending through the L3 vertebral body and across the posterior elements at the level of an L3-4 previous fusion. 2.  Status post revision L5-S1 lumbar fusion approximately 7 weeks ago.  PROCEDURE PERFORMED: 1.  Removal and replacement of spinal instrumentation on the right at L1, L2, and L3, and on the left at L2 and L3, and on the left at S1. 2.  Posterior spinal fusion, L3-4. 3.  Use of local autograft. 4.  Use of morselized allograft-ViviGen. 5.  Intraoperative use of fluoroscopy.  SURGEON:  Phylliss Bob, MD  ASSISTANT:  Pricilla Holm, PA-C   ANESTHESIA:  General endotracheal anesthesia.  COMPLICATIONS:  None.  DISPOSITION:  Stable.  ESTIMATED BLOOD LOSS:  Minimal.  INDICATIONS FOR SURGERY:  Briefly, the patient is a 77 year old female who is 7 weeks status post a revision lumbar fusion procedure with extension of her fusion down to the pelvis, secondary to an L5-S1 nonunion.  The patient did well after surgery;  however, did have substantial pain in her back about a week ago.  Per my recommendation, we did get her set up with a CAT scan, which did reveal a fusion through her previous fusion across the L3-4 level, extending into the L3 vertebral body.  Given her  substantial pain, we did discuss proceeding with a revision fusion procedure with revision instrumentation.  She did wish to proceed.  OPERATIVE DETAILS:  On 08/27/2017, the patient was brought to surgery, and general endotracheal  anesthesia was administered.  The patient was placed prone on a well-padded flat Jackson bed with a spinal frame.  Antibiotics were given.  The back was  prepped and draped in the usual sterile fashion.  The patient's previous incision was reutilized.  The fascia was incised at the midline.  The paraspinal musculature was bluntly swept laterally.  Of note, there was noted to be a tan-colored,  inflammatory-type fluid noted posteriorly.  It was not clear to me whether this was secondary to infection or potentially a seroma.  I did obtain areas of granulation tissue in the region of the fluid, which was sent to pathology for frozen section.  I  did speak with the pathologist personally, and he did comment to me that there were no inflammatory cells; specifically, there were no PMNs noted.  I then obtained additional fluid, which was sent to pathology for culture.  Although the pathologist did  say that this was negative for infection, I did use pulsatile irrigation with antibiotics throughout the lumbar spine on the right and left sides.  I did use approximately 3 L of pulsatile irrigation.  There were no loculated pockets of infection noted  throughout additional exploration of the lumbar spine.  There were areas of fat necrosis, however, identified.  At this point, I did remove the caps on the right and left sides at L4, L5, S1, and I did also remove the cap associated with a pelvic screw.   The interconnecting rods bilaterally were removed.  I then used a high-speed bur to cut the previously placed rod  on the left side between L1 and L2, and on the right side between T12 and L1.  The previously placed caps were removed, as were the right  L1, L2 and L3 screws and the left L2 and L3 screws.  At this point, the facet joints at L3-4 bilaterally were identified and subperiosteally exposed, and a high-speed bur was used to decorticate the posterior elements.  I then placed 7 x 45 mm screws on  the right at L1  and L2, and a 7 x 50 mm screw on the right at L3.  On the left, a 7 x 45 mm screw was placed at L2, and a 7 x 50 mm screw was placed at L3.  I did test each of the screws, and there was no screw that tested below 20 mA.  At this point,  the rod was cut to the appropriate length and contoured into the appropriate degree of lordosis.  The rod was then secured from L1 to the pelvis on the right and from L2 to the pelvis on the left.  Caps were placed, and a final locking procedure was  performed.  Of note, on the left at S1, the 9 mm screw was removed, and a 10 x 55 mm screw was replaced as well prior to placing the rod.  Once the caps were final locks, I did pack abundant autograft as well as ViviGen into the posterolateral gutters  and posterior elements across the L3-4 intervertebral space to help aid in the success of the fusion.  Also, as the right L3 screw did extend into the fracture in the vertebral body of L3, I did pack abundant ViviGen through the cannulated pedicle hole  into the vertebral body, again to aid in the success of the fusion.  I was very pleased with the final construct in AP and lateral fluoroscopic images.  I then placed 1 g of vancomycin powder in the depths of the wound.  At this point, a #15 deep Blake  drain was placed deep to the fascia.  The fascia was then closed using #1 Vicryl in layers.  The subcutaneous layer was closed using 2-0 Vicryl, and the skin was closed using 4-0 Monocryl.  Benzoin and Steri-Strips were applied, followed by a sterile  dressing.  All instrument counts were correct at the termination of the procedure.  Of note, Pricilla Holm was my assistant throughout surgery and did aid in retraction, suctioning, and closure.  LN/NUANCE  D:08/27/2017 T:08/27/2017 JOB:[id number]

## 2017-08-27 NOTE — Transfer of Care (Signed)
Immediate Anesthesia Transfer of Care Note  Patient: Karen Castillo  Procedure(s) Performed: LUMBAR 3 -4  LUMBAR FUSION REVISION WITH INSTRUMENTATION AND ALLOGRAFT.  TIME REQUESTED 4 HOURS (N/A )  Patient Location: PACU  Anesthesia Type:General  Level of Consciousness: awake, alert  and oriented  Airway & Oxygen Therapy: Patient Spontanous Breathing and Patient connected to face mask oxygen  Post-op Assessment: Report given to RN and Post -op Vital signs reviewed and stable  Post vital signs: Reviewed and stable  Last Vitals:  Vitals Value Taken Time  BP 154/125 08/27/2017  7:16 PM  Temp    Pulse 71 08/27/2017  7:17 PM  Resp 19 08/27/2017  7:17 PM  SpO2 100 % 08/27/2017  7:17 PM  Vitals shown include unvalidated device data.  Last Pain:  Vitals:   08/27/17 1212  TempSrc:   PainSc: 5          Complications: No apparent anesthesia complications

## 2017-08-27 NOTE — Progress Notes (Signed)
Pharmacy Antibiotic Note  Karen Castillo is a 77 y.o. female s/p revision fusion procedure admitted on 08/27/2017 with possible wound infection.  Pharmacy has been consulted for Vancomycin dosing. ID consulted. Patient had revision today. WBC is within normal limits. Patient is afebrile. Last SCr on 6/18 was stable at 0.77. She received Vancomycin 1500mg  at 1339PM.   Plan: Continue Vancomycin at 750 mg IV every 12 hours.  Monitor renal function, culture results and clinical status. Follow-up ID recommendations   Height: 5\' 4"  (162.6 cm) Weight: 225 lb (102.1 kg) IBW/kg (Calculated) : 54.7  Temp (24hrs), Avg:97.9 F (36.6 C), Min:97.5 F (36.4 C), Max:98.6 F (37 C)  Recent Labs  Lab 08/25/17 1559  WBC 7.6  CREATININE 0.77    Estimated Creatinine Clearance: 70.7 mL/min (by C-G formula based on SCr of 0.77 mg/dL).    Allergies  Allergen Reactions  . Ciprofloxacin Swelling    Tongue swells  . Cyclobenzaprine Swelling    Feet swell   . Pregabalin Anaphylaxis, Shortness Of Breath, Swelling and Other (See Comments)    Dizziness also  . Fluvastatin Other (See Comments)    Leg pain   . Meloxicam Swelling    Feet became swollen  . Metaxalone Rash and Other (See Comments)    Flushing of the skin and sore throat, too  . Pramipexole Nausea And Vomiting  . Pravastatin Other (See Comments)    Leg pain  . Rosuvastatin Other (See Comments)    Leg pain  . Sertraline Other (See Comments)    Sensitive teeth  . Statins Other (See Comments)    Leg pain     . Amitriptyline Other (See Comments)    Weird feeling all over  . Arthrotec [Diclofenac-Misoprostol] Diarrhea  . Augmentin [Amoxicillin-Pot Clavulanate] Diarrhea    Has patient had a PCN reaction causing immediate rash, facial/tongue/throat swelling, SOB or lightheadedness with hypotension: No Has patient had a PCN reaction causing severe rash involving mucus membranes or skin necrosis: No Has patient had a PCN reaction that  required hospitalization: No Has patient had a PCN reaction occurring within the last 10 years: No If all of the above answers are "NO", then may proceed with Cephalosporin use.   . Bromfenac Diarrhea and Nausea Only  . Cephalexin Diarrhea and Nausea Only  . Conj Estrog-Medroxyprogest Ace Other (See Comments)    Unsure of reaction type Couldn't tolerate  . Diclofenac-Misoprostol Nausea Only and Diarrhea  . Erythromycin Diarrhea and Nausea Only  . Esomeprazole Diarrhea  . Esomeprazole Magnesium Nausea Only  . Levofloxacin Other (See Comments) and Nausea Only    Stomach upset  . Methadone Diarrhea  . Metoclopramide Diarrhea and Nausea Only  . Oxycodone Hives, Swelling and Rash  . Propoxyphene Diarrhea and Nausea Only  . Rofecoxib Diarrhea and Nausea Only  . Sulfur Diarrhea and Nausea Only  . Tramadol Diarrhea  . Zoloft [Sertraline Hcl] Other (See Comments)    Sensitivity with teeth    Antimicrobials this admission: Vancomycin 6/20 >>  Dose adjustments this admission:   Microbiology results: 6/20 Wound lumbar >>  Thank you for allowing pharmacy to be a part of this patient's care.  Sloan Leiter, PharmD, BCPS, BCCCP Clinical Pharmacist Clinical phone 08/27/2017 until 10:30PM [phone] After hours, please call [phone] 08/27/2017 9:21 PM

## 2017-08-27 NOTE — Anesthesia Procedure Notes (Signed)
Procedure Name: Intubation Date/Time: 08/27/2017 2:29 PM Performed by: Gwyndolyn Saxon, CRNA Pre-anesthesia Checklist: Patient identified, Emergency Drugs available, Suction available, Patient being monitored and Timeout performed Patient Re-evaluated:Patient Re-evaluated prior to induction Oxygen Delivery Method: Circle system utilized Preoxygenation: Pre-oxygenation with 100% oxygen Induction Type: IV induction Ventilation: Mask ventilation without difficulty Laryngoscope Size: Miller and 2 Grade View: Grade II Tube type: Oral Tube size: 7.0 mm Number of attempts: 1 Placement Confirmation: ETT inserted through vocal cords under direct vision,  positive ETCO2,  CO2 detector and breath sounds checked- equal and bilateral Secured at: 21 cm Tube secured with: Tape Dental Injury: Teeth and Oropharynx as per pre-operative assessment

## 2017-08-27 NOTE — Anesthesia Preprocedure Evaluation (Signed)
Anesthesia Evaluation  Patient identified by MRN, date of birth, ID band Patient awake    Reviewed: Allergy & Precautions, NPO status , Patient's Chart, lab work & pertinent test results  Airway Mallampati: II   Neck ROM: Full    Dental  (+) Teeth Intact, Dental Advisory Given   Pulmonary    breath sounds clear to auscultation       Cardiovascular hypertension,  Rhythm:Regular Rate:Normal     Neuro/Psych    GI/Hepatic   Endo/Other    Renal/GU      Musculoskeletal   Abdominal   Peds  Hematology   Anesthesia Other Findings   Reproductive/Obstetrics                             Anesthesia Physical Anesthesia Plan  ASA: III  Anesthesia Plan: General   Post-op Pain Management:    Induction: Intravenous  PONV Risk Score and Plan: Ondansetron  Airway Management Planned: Oral ETT  Additional Equipment:   Intra-op Plan:   Post-operative Plan: Extubation in OR  Informed Consent: I have reviewed the patients History and Physical, chart, labs and discussed the procedure including the risks, benefits and alternatives for the proposed anesthesia with the patient or authorized representative who has indicated his/her understanding and acceptance.     Plan Discussed with:   Anesthesia Plan Comments:         Anesthesia Quick Evaluation

## 2017-08-27 NOTE — Progress Notes (Signed)
    Of note ID was consulted on this patient for potential wound infection. Cultures are pending, specimens sent from OR showed no inflammatory/infections cells. ID and ourselves have decided to treat prophylactically for now. ID recommended Vanc, which has been ordered, and also ceftriaxone. Pt has documented allergy to ceftriaxone and we will hold on this and defer to ID recs after they evaluate her in the am, we appreciate their input.  Pricilla Holm, PA-C  [place]  [phone]

## 2017-08-27 NOTE — Anesthesia Postprocedure Evaluation (Signed)
Anesthesia Post Note  Patient: Karen Castillo  Procedure(s) Performed: LUMBAR 3 -4  LUMBAR FUSION REVISION WITH INSTRUMENTATION AND ALLOGRAFT.  TIME REQUESTED 4 HOURS (N/A )     Patient location during evaluation: PACU Anesthesia Type: General Level of consciousness: awake and alert Pain management: pain level controlled Vital Signs Assessment: post-procedure vital signs reviewed and stable Respiratory status: spontaneous breathing, nonlabored ventilation and respiratory function stable Cardiovascular status: blood pressure returned to baseline and stable Postop Assessment: no apparent nausea or vomiting Anesthetic complications: no    Last Vitals:  Vitals:   08/27/17 2020 08/27/17 2030  BP:    Pulse: 60 60  Resp: 17 13  Temp: (!) 36.4 C   SpO2: 100% 93%    Last Pain:  Vitals:   08/27/17 2030  TempSrc:   PainSc: [place] Caroleen Stoermer

## 2017-08-27 NOTE — H&P (Signed)
PREOPERATIVE H&P  Chief Complaint: Low back pain  HPI: Karen Castillo is a 77 y.o. female who presents with ongoing pain in the low back. Patient is s/p a revision fusion procedure, that she did very well from, but had an increase in pain las week. A postoperative CT scan revealed a postoperative fracture through the L3/4 level.   Patient has failed multiple forms of conservative care and continues to have pain (see office notes for additional details regarding the patient's full course of treatment)  Past Medical History:  Diagnosis Date  . Allergic rhinitis   . Anemia   . Arthritis   . Bipolar disorder ([place])    patient denies  . Cancer (HCC)    SKIN    ,    LEFT BREAST   . Chronic constipation   . Chronic pain syndrome   . Dry eyes   . Dysuria   . GERD (gastroesophageal reflux disease)   . Gout   . Hypertension   . Insomnia   . Schizophrenia ([place])   . Sleep apnea    USES CPAP   . Urticaria   . Vitamin deficiency    Past Surgical History:  Procedure Laterality Date  . ABDOMINAL EXPOSURE N/A 05/04/2014   Procedure: ABDOMINAL EXPOSURE;  Surgeon: Serafina Mitchell, MD;  Location: [facility];  Service: Vascular;  Laterality: N/A;  . ANTERIOR LUMBAR FUSION N/A 05/04/2014   Procedure: ANTERIOR LUMBAR FUSION 1 LEVEL;  Surgeon: Sinclair Ship, MD;  Location: [facility];  Service: Orthopedics;  Laterality: N/A;  Lumbar 5-sacrum 1 anterior lumbar interbody fusion with instrumentation and allograft  . BACK SURGERY     2010 ,2015LUMB FUSION, 06/2013 [facility] FUSION  . BACK SURGERY  07/2017   Lumbar  . BREAST SURGERY     LEFT BRREAST BX  . CARPAL TUNNEL RELEASE     1994 RT  . CARPAL TUNNEL RELEASE Bilateral   . CHOLECYSTECTOMY     2004  . COLONOSCOPY W/ BIOPSIES    . ERCP     2009  . LEEP     [id number]  . SKIN CANCER EXCISION     X 4   . TUBAL LIGATION     Social History   Socioeconomic History  . Marital status: Widowed    Spouse name: Not on file  . Number of  children: Not on file  . Years of education: Not on file  . Highest education level: Not on file  Occupational History  . Not on file  Social Needs  . Financial resource strain: Not on file  . Food insecurity:    Worry: Not on file    Inability: Not on file  . Transportation needs:    Medical: Not on file    Non-medical: Not on file  Tobacco Use  . Smoking status: Never Smoker  . Smokeless tobacco: Never Used  Substance and Sexual Activity  . Alcohol use: No  . Drug use: No  . Sexual activity: Not on file  Lifestyle  . Physical activity:    Days per week: Not on file    Minutes per session: Not on file  . Stress: Not on file  Relationships  . Social connections:    Talks on phone: Not on file    Gets together: Not on file    Attends religious service: Not on file    Active member of club or organization: Not on file    Attends meetings of  clubs or organizations: Not on file    Relationship status: Not on file  Other Topics Concern  . Not on file  Social History Narrative  . Not on file   No family history on file. Allergies  Allergen Reactions  . Ciprofloxacin Swelling    Tongue swells  . Cyclobenzaprine Swelling    Feet swell   . Pregabalin Anaphylaxis, Shortness Of Breath, Swelling and Other (See Comments)    Dizziness also  . Fluvastatin Other (See Comments)    Leg pain   . Meloxicam Swelling    Feet became swollen  . Metaxalone Rash and Other (See Comments)    Flushing of the skin and sore throat, too  . Pramipexole Nausea And Vomiting  . Pravastatin Other (See Comments)    Leg pain  . Rosuvastatin Other (See Comments)    Leg pain  . Sertraline Other (See Comments)    Sensitive teeth  . Statins Other (See Comments)    Leg pain     . Amitriptyline Other (See Comments)    Weird feeling all over  . Arthrotec [Diclofenac-Misoprostol] Diarrhea  . Augmentin [Amoxicillin-Pot Clavulanate] Diarrhea    Has patient had a PCN reaction causing immediate  rash, facial/tongue/throat swelling, SOB or lightheadedness with hypotension: No Has patient had a PCN reaction causing severe rash involving mucus membranes or skin necrosis: No Has patient had a PCN reaction that required hospitalization: No Has patient had a PCN reaction occurring within the last 10 years: No If all of the above answers are "NO", then may proceed with Cephalosporin use.   . Bromfenac Diarrhea and Nausea Only  . Cephalexin Diarrhea and Nausea Only  . Conj Estrog-Medroxyprogest Ace Other (See Comments)    Unsure of reaction type Couldn't tolerate  . Diclofenac-Misoprostol Nausea Only and Diarrhea  . Erythromycin Diarrhea and Nausea Only  . Esomeprazole Diarrhea  . Esomeprazole Magnesium Nausea Only  . Levofloxacin Other (See Comments) and Nausea Only    Stomach upset  . Methadone Diarrhea  . Metoclopramide Diarrhea and Nausea Only  . Oxycodone Hives, Swelling and Rash  . Propoxyphene Diarrhea and Nausea Only  . Rofecoxib Diarrhea and Nausea Only  . Sulfur Diarrhea and Nausea Only  . Tramadol Diarrhea  . Zoloft [Sertraline Hcl] Other (See Comments)    Sensitivity with teeth   Prior to Admission medications   Medication Sig Start Date End Date Taking? Authorizing Provider  allopurinol (ZYLOPRIM) 100 MG tablet Take 100 mg by mouth daily.   Yes [provider]  amLODipine (NORVASC) 10 MG tablet Take 10 mg by mouth daily.   Yes [provider]  aspirin EC 81 MG tablet Take 81 mg by mouth daily.   Yes [provider]  diazepam (VALIUM) 5 MG tablet Take 1 tablet (5 mg total) by mouth every 6 (six) hours as needed for muscle spasms (for spasms). 07/16/17  Yes Kurtis Anastasia, Elta Guadeloupe, MD  GINKGO BILOBA EXTRACT PO Take 1 tablet by mouth daily.    Yes [provider]  Glucosamine-Chondroitin (COSAMIN DS PO) Take 2 tablets by mouth daily.   Yes [provider]  HYDROcodone-acetaminophen (NORCO) 10-325 MG tablet Take 1 tablet by mouth every 4  (four) hours as needed for moderate pain or severe pain.   Yes [provider]  Lactobacillus (ACIDOPHILUS PO) Take 1 capsule by mouth daily.   Yes [provider]  lansoprazole (PREVACID) 30 MG capsule Take 30 mg by mouth daily before breakfast. 30 minutes before breakfast  Yes [provider]  MAGNESIUM PO Take 140 mg by mouth every morning. CRAMP DEFENSE    Yes [provider]  polyethylene glycol (MIRALAX / GLYCOLAX) packet Take 17 g by mouth daily. 07/23/17  Yes Kayleen Memos, DO  spironolactone (ALDACTONE) 25 MG tablet Take 25 mg by mouth daily.   Yes [provider]  Wheat Dextrin (BENEFIBER) POWD Take 1 packet by mouth daily. Clear and Sugar FREE   Yes [provider]  atenolol (TENORMIN) 100 MG tablet Take 100 mg by mouth at bedtime.     [provider]  azelastine (ASTELIN) 0.1 % nasal spray Place 1-2 sprays into both nostrils 2 (two) times daily as needed for rhinitis or allergies.     [provider]  Calcium Carb-Cholecalciferol (CALCIUM+D3 PO) Take 1 tablet by mouth daily.     [provider]  Cholecalciferol (VITAMIN D3) 2000 units TABS Take 2,000 Units by mouth daily.    [provider]  cloNIDine (CATAPRES) 0.1 MG tablet Take 0.1 mg by mouth at bedtime.    [provider]  cycloSPORINE (RESTASIS) 0.05 % ophthalmic emulsion Place 1 drop into both eyes 4 (four) times daily.    [provider]  fexofenadine (ALLEGRA) 180 MG tablet Take 180 mg by mouth at bedtime.    [provider]  latanoprost (XALATAN) 0.005 % ophthalmic solution Place 1 drop into both eyes at bedtime.    [provider]  losartan-hydrochlorothiazide (HYZAAR) 100-25 MG tablet Take 1 tablet by mouth daily.    [provider]  Melatonin 10 MG TABS Take 10 mg by mouth at bedtime. (2100)    [provider]  nortriptyline (PAMELOR) 10 MG capsule Take 10 mg by mouth at bedtime.      [provider]  Omega-3 Fatty Acids (FISH OIL) 500 MG CAPS Take 500 mg by mouth 2 (two) times daily.    [provider]  orphenadrine (NORFLEX) 100 MG tablet Take 1 tablet (100 mg total) by mouth 2 (two) times daily as needed for muscle spasms or mild pain. 07/28/17   Rolland Porter, MD  promethazine (PHENERGAN) 12.5 MG tablet Take 12.5 mg by mouth 2 (two) times daily as needed for nausea or vomiting.  06/22/17   [provider]  senna-docusate (SENOKOT-S) 8.6-50 MG tablet Take 2 tablets by mouth 2 (two) times daily. 07/22/17   Kayleen Memos, DO  timolol (BETIMOL) 0.5 % ophthalmic solution Place 1 drop into both eyes daily.    [provider]  traZODone (DESYREL) 100 MG tablet Take 200 mg by mouth at bedtime.     [provider]     All other systems have been reviewed and were otherwise negative with the exception of those mentioned in the HPI and as above.  Physical Exam: There were no vitals filed for this visit.  There is no height or weight on file to calculate BMI.  General: Alert, no acute distress Cardiovascular: No pedal edema Respiratory: No cyanosis, no use of accessory musculature Skin: No lesions in the area of chief complaint Neurologic: Sensation intact distally Psychiatric: Patient is competent for consent with normal mood and affect Lymphatic: No axillary or cervical lymphadenopathy  MUSCULOSKELETAL: + TTP low back  Assessment/Plan: LOW BACK PAIN, L3/4 FRACTURE EXTENDING THROUGH ANTERIOR, MIDDLE AND POSTERIOR COLUMNS Plan for Procedure(s): LUMBAR 3 -4  LUMBAR FUSION REVISION WITH INSTRUMENTATION AND ALLOGRAFT.    Sinclair Ship, MD 08/27/2017 6:40 AM

## 2017-08-28 ENCOUNTER — Inpatient Hospital Stay: Payer: Self-pay

## 2017-08-28 DIAGNOSIS — G4733 Obstructive sleep apnea (adult) (pediatric): Secondary | ICD-10-CM

## 2017-08-28 DIAGNOSIS — F319 Bipolar disorder, unspecified: Secondary | ICD-10-CM

## 2017-08-28 DIAGNOSIS — R58 Hemorrhage, not elsewhere classified: Secondary | ICD-10-CM

## 2017-08-28 DIAGNOSIS — Z885 Allergy status to narcotic agent status: Secondary | ICD-10-CM

## 2017-08-28 DIAGNOSIS — Z881 Allergy status to other antibiotic agents status: Secondary | ICD-10-CM

## 2017-08-28 DIAGNOSIS — Z886 Allergy status to analgesic agent status: Secondary | ICD-10-CM

## 2017-08-28 DIAGNOSIS — M545 Low back pain: Secondary | ICD-10-CM

## 2017-08-28 DIAGNOSIS — Z91048 Other nonmedicinal substance allergy status: Secondary | ICD-10-CM

## 2017-08-28 DIAGNOSIS — Z888 Allergy status to other drugs, medicaments and biological substances status: Secondary | ICD-10-CM

## 2017-08-28 DIAGNOSIS — M8008XD Age-related osteoporosis with current pathological fracture, vertebra(e), subsequent encounter for fracture with routine healing: Secondary | ICD-10-CM

## 2017-08-28 DIAGNOSIS — I10 Essential (primary) hypertension: Secondary | ICD-10-CM

## 2017-08-28 DIAGNOSIS — Z981 Arthrodesis status: Secondary | ICD-10-CM

## 2017-08-28 DIAGNOSIS — G8929 Other chronic pain: Secondary | ICD-10-CM

## 2017-08-28 LAB — CBC WITH DIFFERENTIAL/PLATELET
Abs Immature Granulocytes: 0.1 10*3/uL (ref 0.0–0.1)
BASOS ABS: 0 10*3/uL (ref 0.0–0.1)
Basophils Relative: 0 %
EOS ABS: 0 10*3/uL (ref 0.0–0.7)
EOS PCT: 0 %
HEMATOCRIT: 29.9 % — AB (ref 36.0–46.0)
Hemoglobin: 9.7 g/dL — ABNORMAL LOW (ref 12.0–15.0)
IMMATURE GRANULOCYTES: 1 %
Lymphocytes Relative: 9 %
Lymphs Abs: 1.1 10*3/uL (ref 0.7–4.0)
MCH: 28 pg (ref 26.0–34.0)
MCHC: 32.4 g/dL (ref 30.0–36.0)
MCV: 86.4 fL (ref 78.0–100.0)
Monocytes Absolute: 0.5 10*3/uL (ref 0.1–1.0)
Monocytes Relative: 4 %
NEUTROS PCT: 86 %
Neutro Abs: 11.4 10*3/uL — ABNORMAL HIGH (ref 1.7–7.7)
PLATELETS: 314 10*3/uL (ref 150–400)
RBC: 3.46 MIL/uL — ABNORMAL LOW (ref 3.87–5.11)
RDW: 13.3 % (ref 11.5–15.5)
WBC: 13.1 10*3/uL — AB (ref 4.0–10.5)

## 2017-08-28 LAB — BASIC METABOLIC PANEL
ANION GAP: 8 (ref 5–15)
BUN: 11 mg/dL (ref 6–20)
CO2: 26 mmol/L (ref 22–32)
CREATININE: 0.85 mg/dL (ref 0.44–1.00)
Calcium: 9 mg/dL (ref 8.9–10.3)
Chloride: 100 mmol/L — ABNORMAL LOW (ref 101–111)
GFR calc Af Amer: >60 mL/min (ref >60)
Glucose, Bld: 172 mg/dL — ABNORMAL HIGH (ref 65–99)
Potassium: 4.2 mmol/L (ref 3.5–5.1)
SODIUM: 134 mmol/L — AB (ref 135–145)

## 2017-08-28 LAB — SEDIMENTATION RATE: SED RATE: 55 mm/h — AB (ref 0–22)

## 2017-08-28 LAB — C-REACTIVE PROTEIN: CRP: 2 mg/dL — AB (ref <1.0)

## 2017-08-28 MED ORDER — SODIUM CHLORIDE 0.9 % IV BOLUS
500.0000 mL | Freq: Once | INTRAVENOUS | Status: AC
  Administered 2017-08-28: 500 mL via INTRAVENOUS

## 2017-08-28 MED ORDER — PROMETHAZINE HCL 25 MG/ML IJ SOLN
12.5000 mg | Freq: Four times a day (QID) | INTRAMUSCULAR | Status: DC | PRN
Start: 2017-08-28 — End: 2017-09-04
  Administered 2017-08-28 – 2017-09-03 (×13): 12.5 mg via INTRAVENOUS
  Filled 2017-08-28 (×13): qty 1

## 2017-08-28 MED ORDER — DIPHENHYDRAMINE HCL 50 MG/ML IJ SOLN
25.0000 mg | Freq: Four times a day (QID) | INTRAMUSCULAR | Status: DC | PRN
  Administered 2017-08-28 – 2017-09-04 (×7): 25 mg via INTRAVENOUS
  Filled 2017-08-28 (×7): qty 1

## 2017-08-28 MED ORDER — VANCOMYCIN IV (FOR PTA / DISCHARGE USE ONLY): 750.0000 mg | Freq: Two times a day (BID) | INTRAVENOUS | 0 refills | Status: AC

## 2017-08-28 MED ORDER — HYDROMORPHONE HCL 1 MG/ML IJ SOLN
0.5000 mg | INTRAMUSCULAR | Status: DC | PRN
  Administered 2017-08-28 – 2017-09-04 (×12): 0.5 mg via INTRAVENOUS
  Filled 2017-08-28 (×11): qty 1

## 2017-08-28 MED ORDER — SODIUM CHLORIDE 0.9% FLUSH: 10.0000 mL | INTRAVENOUS | Status: DC | PRN

## 2017-08-28 MED ORDER — SODIUM CHLORIDE 0.9 % IV SOLN
INTRAVENOUS | Status: DC | PRN
  Administered 2017-08-28 – 2017-09-02 (×4): via INTRAVENOUS

## 2017-08-28 MED ORDER — ONDANSETRON HCL 4 MG/2ML IJ SOLN
4.0000 mg | Freq: Four times a day (QID) | INTRAMUSCULAR | Status: DC | PRN
  Administered 2017-08-28 – 2017-09-03 (×12): 4 mg via INTRAVENOUS
  Filled 2017-08-28 (×13): qty 2

## 2017-08-28 MED ORDER — SODIUM CHLORIDE 0.9% FLUSH
10.0000 mL | Freq: Two times a day (BID) | INTRAVENOUS | Status: DC
  Administered 2017-08-28 – 2017-08-29 (×3): 10 mL
  Administered 2017-08-30: 20 mL
  Administered 2017-08-30 – 2017-09-04 (×10): 10 mL

## 2017-08-28 MED ORDER — RIFAMPIN 300 MG PO CAPS
300.0000 mg | ORAL_CAPSULE | Freq: Two times a day (BID) | ORAL | Status: DC
  Administered 2017-08-28 – 2017-08-31 (×6): 300 mg via ORAL
  Filled 2017-08-28 (×7): qty 1

## 2017-08-28 NOTE — Progress Notes (Signed)
Peripherally Inserted Central Catheter/Midline Placement  The IV Nurse has discussed with the patient and/or persons authorized to consent for the patient, the purpose of this procedure and the potential benefits and risks involved with this procedure.  The benefits include less needle sticks, lab draws from the catheter, and the patient may be discharged home with the catheter. Risks include, but not limited to, infection, bleeding, blood clot (thrombus formation), and puncture of an artery; nerve damage and irregular heartbeat and possibility to perform a PICC exchange if needed/ordered by physician.  Alternatives to this procedure were also discussed.  Bard Power PICC patient education guide, fact sheet on infection prevention and patient information card has been provided to patient /or left at bedside.    PICC/Midline Placement Documentation  PICC Single Lumen 08/28/17 PICC Right Brachial 39 cm 0 cm (Active)  Indication for Insertion or Continuance of Line Home intravenous therapies (PICC only) 08/28/2017  9:07 PM  Exposed Catheter (cm) 0 cm 08/28/2017  9:07 PM  Site Assessment Clean;Dry;Intact 08/28/2017  9:07 PM  Line Status Flushed;Saline locked;Blood return noted 08/28/2017  9:07 PM  Dressing Type Transparent 08/28/2017  9:07 PM  Dressing Status Clean;Dry;Intact;Antimicrobial disc in place 08/28/2017  9:07 PM  Dressing Change Due 09/04/17 08/28/2017  9:07 PM       Gordan Payment 08/28/2017, 9:09 PM

## 2017-08-28 NOTE — Consult Note (Addendum)
[facility]ious Disease  Total days of antibiotics 2        Day 2 vanco       Reason for Consult:possible HW infection   Referring Physician: dumonski  Active Problems:   Lower back pain    HPI: Karen Castillo is a 77 y.o. female with history of chronic back pain, HTN, bipolar disorder, OSA, who has had hx of lumbar fusion revision of L-S1 procedure on 5/2 but post operatively had ongoing back/hip pain until becoming intolerable in the last week. She was found to have L3-L4 fracture. She was admitted for revision since she had failed conservative pain management. Intra-operatively, tan-colored, possibly purulent fluid collection vs. seroma was noted as they dissected paraspinal musculature. Frozen section did not suggests any PMNs. cx sent due to concern for infection not previously known. Swab - did not show any bacteria or pmns on gram stain. She had HW replacement in addn to pulse lavage of antibiotics to her affected area. 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  Past Medical History:  Diagnosis Date  . Allergic rhinitis   . Anemia   . Arthritis   . Bipolar disorder ([place])    patient denies  . Cancer (HCC)    SKIN    ,    LEFT BREAST   . Chronic constipation   . Chronic pain syndrome   . Dry eyes   . Dysuria   . GERD (gastroesophageal reflux disease)   . Gout   . Hypertension   . Insomnia   . Schizophrenia ([place])   . Sleep apnea    USES CPAP   . Urticaria   . Vitamin deficiency     Allergies:  Allergies  Allergen Reactions  . Ciprofloxacin Swelling    Tongue swells  . Cyclobenzaprine Swelling    Feet swell   . Pregabalin Anaphylaxis, Shortness Of Breath, Swelling and Other (See Comments)    Dizziness also  . Fluvastatin Other (See Comments)    Leg pain   . Meloxicam Swelling    Feet became swollen  . Metaxalone Rash and Other (See Comments)    Flushing of the skin and sore throat, too  . Pramipexole  Nausea And Vomiting  . Pravastatin Other (See Comments)    Leg pain  . Rosuvastatin Other (See Comments)    Leg pain  . Sertraline Other (See Comments)    Sensitive teeth  . Statins Other (See Comments)    Leg pain     . Amitriptyline Other (See Comments)    Weird feeling all over  . Arthrotec [Diclofenac-Misoprostol] Diarrhea  . Augmentin [Amoxicillin-Pot Clavulanate] Diarrhea    Has patient had a PCN reaction causing immediate rash, facial/tongue/throat swelling, SOB or lightheadedness with hypotension: No Has patient had a PCN reaction causing severe rash involving mucus membranes or skin necrosis: No Has patient had a PCN reaction that required hospitalization: No Has patient had a PCN reaction occurring within the last 10 years: No If all of the above answers are "NO", then may proceed with Cephalosporin use.   . Bromfenac Diarrhea and Nausea Only  . Cephalexin Diarrhea and Nausea Only  . Conj Estrog-Medroxyprogest Ace Other (See Comments)    Unsure of reaction type Couldn't tolerate  . Diclofenac-Misoprostol Nausea Only and Diarrhea  . Erythromycin Diarrhea and Nausea Only  . Esomeprazole Diarrhea  . Esomeprazole Magnesium Nausea Only  . Levofloxacin Other (See Comments) and Nausea  Only    Stomach upset  . Methadone Diarrhea  . Metoclopramide Diarrhea and Nausea Only  . Oxycodone Hives, Swelling and Rash  . Propoxyphene Diarrhea and Nausea Only  . Rofecoxib Diarrhea and Nausea Only  . Sulfur Diarrhea and Nausea Only  . Tramadol Diarrhea  . Zoloft [Sertraline Hcl] Other (See Comments)    Sensitivity with teeth    MEDICATIONS: . allopurinol  100 mg Oral Daily  . amLODipine  10 mg Oral Daily  . atenolol  100 mg Oral QHS  . bupivacaine liposome  20 mL Infiltration STAT  . calcium-vitamin D  1 tablet Oral Q breakfast  . cholecalciferol  2,000 Units Oral Daily  . cloNIDine  0.1 mg Oral QHS  . losartan  100 mg Oral Daily   And  . hydrochlorothiazide  25 mg Oral  Daily  . latanoprost  1 drop Both Eyes QHS  . Melatonin  9 mg Oral QHS  . nortriptyline  10 mg Oral QHS  . pantoprazole  40 mg Oral Daily  . polyethylene glycol  17 g Oral Daily  . psyllium  1 packet Oral Daily  . senna-docusate  2 tablet Oral BID  . spironolactone  25 mg Oral Daily  . timolol  1 drop Both Eyes Daily  . traZODone  200 mg Oral QHS    Social History   Tobacco Use  . Smoking status: Never Smoker  . Smokeless tobacco: Never Used  Substance Use Topics  . Alcohol use: No  . Drug use: No    Family history =  Review of Systems  Constitutional: Negative for fever, chills, diaphoresis, activity change, appetite change, fatigue and unexpected weight change.  HENT: Negative for congestion, sore throat, rhinorrhea, sneezing, trouble swallowing and sinus pressure.  Eyes: Negative for photophobia and visual disturbance.  Respiratory: Negative for cough, chest tightness, shortness of breath, wheezing and stridor.  Cardiovascular: Negative for chest pain, palpitations and leg swelling.  Gastrointestinal: Negative for nausea, vomiting, abdominal pain, diarrhea, constipation, blood in stool, abdominal distention and anal bleeding.  Genitourinary: Negative for dysuria, hematuria, flank pain and difficulty urinating.  Musculoskeletal: +back pain with difficulty with ambulation Skin: Negative for color change, pallor, rash and wound.  Neurological: Negative for dizziness, tremors, weakness and light-headedness.  Hematological: Negative for adenopathy. Does not bruise/bleed easily.  Psychiatric/Behavioral: Negative for behavioral problems, confusion, sleep disturbance, dysphoric mood, decreased concentration and agitation.      OBJECTIVE: Temp:  [97.5 F (36.4 C)-98.6 F (37 C)] 98.5 F (36.9 C) (06/21 0819) Pulse Rate:  [60-72] 68 (06/21 0819) Resp:  [11-23] 11 (06/21 0819) BP: (109-155)/(34-125) 130/44 (06/21 0819) SpO2:  [93 %-100 %] 97 % (06/21 0819) Physical Exam    Constitutional:  oriented to person, place, and time. appears well-developed and well-nourished. No distress.  HENT: [place]/AT, PERRLA, no scleral icterus Mouth/Throat: Oropharynx is clear and moist. No oropharyngeal exudate.  Cardiovascular: Normal rate, regular rhythm and normal heart sounds. Exam reveals no gallop and no friction rub.  No murmur heard.  Pulmonary/Chest: Effort normal and breath sounds normal. No respiratory distress.  has no wheezes.  Neck = supple, no nuchal rigidity Abdominal: Soft. Bowel sounds are normal.  exhibits no distension. There is no tenderness.  Back= lumbar drain in place with serosanginous fluid in the bulb Lymphadenopathy: no cervical adenopathy. No axillary adenopathy Neurological: alert and oriented to person, place, and time.  Skin: Skin is warm and dry. Appears frail. Has skin tear to right forearm, scattered echymosis Psychiatric:  a normal mood and affect.  behavior is normal.    LABS: Results for orders placed or performed during the hospital encounter of 08/27/17 (from the past 48 hour(s))  Aerobic/Anaerobic Culture (surgical/deep wound)     Status: None (Preliminary result)   Collection Time: 08/27/17  3:19 PM  Result Value Ref Range   Specimen Description WOUND LUMBAR    Special Requests SPEC A ON SWABS    Gram Stain NO WBC SEEN NO ORGANISMS SEEN     Culture      NO GROWTH < 24 HOURS NO ANAEROBES ISOLATED; CULTURE IN PROGRESS FOR 5 DAYS Performed at [facility], [address] [place], [place] [address]    Report Status PENDING   CBC with Differential/Platelet     Status: Abnormal   Collection Time: 08/28/17  8:40 AM  Result Value Ref Range   WBC 13.1 (H) 4.0 - 10.5 K/uL   RBC 3.46 (L) 3.87 - 5.11 MIL/uL   Hemoglobin 9.7 (L) 12.0 - 15.0 g/dL   HCT 29.9 (L) 36.0 - 46.0 %   MCV 86.4 78.0 - 100.0 fL   MCH 28.0 26.0 - 34.0 pg   MCHC 32.4 30.0 - 36.0 g/dL   RDW 13.3 11.5 - 15.5 %   Platelets 314 150 - 400 K/uL   Neutrophils  Relative % 86 %   Neutro Abs 11.4 (H) 1.7 - 7.7 K/uL   Lymphocytes Relative 9 %   Lymphs Abs 1.1 0.7 - 4.0 K/uL   Monocytes Relative 4 %   Monocytes Absolute 0.5 0.1 - 1.0 K/uL   Eosinophils Relative 0 %   Eosinophils Absolute 0.0 0.0 - 0.7 K/uL   Basophils Relative 0 %   Basophils Absolute 0.0 0.0 - 0.1 K/uL   Immature Granulocytes 1 %   Abs Immature Granulocytes 0.1 0.0 - 0.1 K/uL    Comment: Performed at [facility], [address] [place],  [address]    MICRO:  IMAGING: Dg Lumbar Spine 2-3 Views  Result Date: 08/27/2017 CLINICAL DATA:  Localization. EXAM: LUMBAR SPINE - 2-3 VIEW COMPARISON:  CT lumbar spine August 19, 2017 FINDINGS: Two intraoperative lateral radiographs of the lumbar spine. 1501 hours: Surgical instruments indicate the posterior elements of L3-4 and sacrum. Discontinuous bridging rods at L3-4. 1503 hours: Surgical instruments indicate the posterior elements of L3-4 and sacrum. IMPRESSION: Lateral radiographs for surgical localization indicate the posterior elements of L3-4 and sacrum. Electronically Signed   By: Elon Alas M.D.   On: 08/27/2017 18:59   Dg Lumbar Spine 2-3 Views  Result Date: 08/27/2017 CLINICAL DATA:  L3-4 lumbar fusion revision EXAM: LUMBAR SPINE - 2-3 VIEW COMPARISON:  CT lumbar 08/19/2017 FINDINGS: Multilevel with hardware. Limited bony lumbar fusion detail due to C-arm technique. IMPRESSION: C-arm images the lumbar spine demonstrate multilevel lumbar fusion. Follow-up radiographs suggested for better bony detail. Electronically Signed   By: Franchot Gallo M.D.   On: 08/27/2017 18:47   Dg C-arm 1-60 Min  Result Date: 08/27/2017 CLINICAL DATA:  Lumbar fusion and revision EXAM: DG C-ARM 61-120 MIN COMPARISON:  CT 08/19/2017 FINDINGS: Five low resolution intraoperative spot views of the lumbar spine. Total fluoroscopy time was 19 seconds. Posterior stabilization rods and multiple fixating screws. Bilateral SI joint fixating  screws. Left lateral fixating screws are also noted. IMPRESSION: Intraoperative fluoroscopic assistance provided during spine surgery. Electronically Signed   By: Donavan Foil M.D.   On: 08/27/2017 18:40    HISTORICAL MICRO/IMAGING  Assessment/Plan:  77yo F with lumbar fusion and revision, OR findings found fluid collection concerning for possible early infection involving HW.  - will check sed rate and crp - will treat as presumed early infection since high stakes if this progresses without treatment - would start with treating with 4 wk as deep tissue infection, and may extend further depending on her response - will start with vancomycin, with goal of 15-20 - will also start rifampin 300mg  po bid (with meals) - may need prn anti-emetic due to rifampin Nausea profile - will get picc line placed today - have her follow up in the ID clinic in 3-4 wk  For discharge she will need twice a week bmp, weekly vanco trough with goal of 15-20. Also patient will need weekly sed rate, crp, and cbc.  Will see back on Monday and if discharged over the weekend then will see back in the clinic

## 2017-08-28 NOTE — Progress Notes (Signed)
    Patient doing well  Patient describes moderate expected low back pain Patient denies leg pain   Physical Exam: Vitals:   08/28/17 0340 08/28/17 0819  BP: (!) 109/34 (!) 130/44  Pulse: 70 68  Resp: 12 11  Temp: 98.6 F (37 C) 98.5 F (36.9 C)  SpO2: 97% 97%    Dressing in place NVI  Drain output: 190cc since surgery  POD #1 s/p revision L3-L4 fusion with revision instrumentation  - up with PT/OT, encourage ambulation - Percocet for pain, Valium for muscle spasms - likely d/c home today with f/u in 2 weeks - ID to see patient later today for ID recs, given fluid collection noted intraoperatively - will follow blood pressure and will check CBC this AM

## 2017-08-28 NOTE — Progress Notes (Signed)
MD returned called, N/O obtained for Zofran 4mg  Q6 PRN and Phenergan 12.5 Q6 PRN.

## 2017-08-28 NOTE — Progress Notes (Signed)
PHARMACY CONSULT NOTE FOR:  OUTPATIENT  PARENTERAL ANTIBIOTIC THERAPY (OPAT)  Indication: presumed early infection s/p lumbar fusion and revision Regimen: Vancomycin 750mg  IV q12h End date: 09/23/2017  IV antibiotic discharge orders are pended. To discharging provider:  please sign these orders via discharge navigator,  Select New Orders & click on the button choice - Manage This Unsigned Work.     Elicia Lamp, PharmD, BCPS Clinical Pharmacist Clinical phone for 08/28/2017 until 3:30pm: [phone] If after 3:30pm, please call main pharmacy at: [phone] 08/28/2017 1:53 PM

## 2017-08-28 NOTE — Progress Notes (Signed)
Pt c/o nausea, saltines and ginger ale given at this time. MD paged to obtain order for antiemetic, awaiting return call.

## 2017-08-28 NOTE — Care Management Note (Signed)
Case Management Note  Patient Details  Name: [name] MRN: [id number] Date of Birth: 1940/10/15  Subjective/Objective: 77yo F with lumbar fusion and revision, OR findings found fluid collection concerning for possible early infection involving hardware.  PTA, pt resided at a skilled nursing facility, where she was receiving rehab post prior back surgery.                 Action/Plan: PT/OT evaluations pending.  CSW following to facilitate return to skilled nursing facility upon medical stability.  Expected Discharge Date:                  Expected Discharge Plan:  Skilled Nursing Facility  In-House Referral:  Clinical Social Work  Discharge planning Services  CM Consult  Post Acute Care Choice:    Choice offered to:     DME Arranged:    DME Agency:     HH Arranged:    [place] Agency:     Status of Service:  In process, will continue to follow  If discussed at Long Length of Stay Meetings, dates discussed:    Additional Comments:  Ella Bodo, RN 08/28/2017, 4:22 PM

## 2017-08-28 NOTE — Social Work (Signed)
CSW acknowledging consult for social work to facilitate discharge to SNF when medically appropriate.  Pt will need updated PT/OT evaluations for SNF authorization through [employer].  Alexander Mt, [place] Work [phone]

## 2017-08-29 DIAGNOSIS — D62 Acute posthemorrhagic anemia: Secondary | ICD-10-CM

## 2017-08-29 HISTORY — DX: Acute posthemorrhagic anemia: D62

## 2017-08-29 LAB — CBC
HCT: 25.1 % — ABNORMAL LOW (ref 36.0–46.0)
Hemoglobin: 8 g/dL — ABNORMAL LOW (ref 12.0–15.0)
MCH: 28 pg (ref 26.0–34.0)
MCHC: 31.9 g/dL (ref 30.0–36.0)
MCV: 87.8 fL (ref 78.0–100.0)
PLATELETS: 295 10*3/uL (ref 150–400)
RBC: 2.86 MIL/uL — ABNORMAL LOW (ref 3.87–5.11)
RDW: 13.6 % (ref 11.5–15.5)
WBC: 14.2 10*3/uL — AB (ref 4.0–10.5)

## 2017-08-29 LAB — COMPREHENSIVE METABOLIC PANEL
ALT: 12 U/L — ABNORMAL LOW (ref 14–54)
AST: 19 U/L (ref 15–41)
Albumin: 2.4 g/dL — ABNORMAL LOW (ref 3.5–5.0)
Alkaline Phosphatase: 74 U/L (ref 38–126)
Anion gap: 7 (ref 5–15)
BUN: 10 mg/dL (ref 6–20)
CHLORIDE: 103 mmol/L (ref 101–111)
CO2: 24 mmol/L (ref 22–32)
Calcium: 8.4 mg/dL — ABNORMAL LOW (ref 8.9–10.3)
Creatinine, Ser: 0.82 mg/dL (ref 0.44–1.00)
GFR calc Af Amer: >60 mL/min (ref >60)
Glucose, Bld: 124 mg/dL — ABNORMAL HIGH (ref 65–99)
POTASSIUM: 3.1 mmol/L — AB (ref 3.5–5.1)
SODIUM: 134 mmol/L — AB (ref 135–145)
Total Bilirubin: 0.6 mg/dL (ref 0.3–1.2)
Total Protein: 4.9 g/dL — ABNORMAL LOW (ref 6.5–8.1)

## 2017-08-29 LAB — PREPARE RBC (CROSSMATCH)

## 2017-08-29 LAB — VANCOMYCIN, TROUGH: VANCOMYCIN TR: 15 ug/mL (ref 15–20)

## 2017-08-29 MED ORDER — FUROSEMIDE 10 MG/ML IJ SOLN
20.0000 mg | Freq: Once | INTRAMUSCULAR | Status: AC
  Administered 2017-08-29: 20 mg via INTRAVENOUS
  Filled 2017-08-29: qty 2

## 2017-08-29 MED ORDER — CHLORHEXIDINE GLUCONATE CLOTH 2 % EX PADS
6.0000 | MEDICATED_PAD | Freq: Every day | CUTANEOUS | Status: DC
  Administered 2017-08-29 – 2017-09-04 (×6): 6 via TOPICAL

## 2017-08-29 MED ORDER — SODIUM CHLORIDE 0.9% IV SOLUTION
Freq: Once | INTRAVENOUS | Status: AC
  Administered 2017-08-29: 12:00:00 via INTRAVENOUS

## 2017-08-29 NOTE — Progress Notes (Signed)
OT Cancellation Note  Patient Details Name: DONICE ALPERIN MRN: [id number] DOB: May 18, 1940   Cancelled Treatment:    Reason Eval/Treat Not Completed: Patient not medically ready. Pt receiving first unit PRBC at this time. Will check back as appropriate next date to complete OT evaluation.   Norman Herrlich, MS OTR/L  Pager: [phone]   Norman Herrlich 08/29/2017, 3:04 PM

## 2017-08-29 NOTE — NC FL2 (Signed)
[place] LEVEL OF CARE SCREENING TOOL     IDENTIFICATION  Patient Name: Karen Castillo Birthdate: [birth year] Sex: female Admission Date (Current Location): 08/27/2017  [facility] and [place] Number:  [employer] and Address:  [facility], [place] [address], [place], [place] [address]      Provider Number: [id number]  Attending Physician Name and Address:  Phylliss Bob, MD  Relative Name and Phone Number:  [name], daughter, [phone]    Current Level of Care: Hospital Recommended Level of Care: [place] Prior Approval Number:    Date Approved/Denied:   PASRR Number:    Discharge Plan: SNF    Current Diagnoses: Patient Active Problem List   Diagnosis Date Noted  . Postoperative anemia due to acute blood loss 08/29/2017  . Lower back pain 08/27/2017  . Urticaria 07/19/2017  . Acute post-operative pain 07/13/2017  . Intractable low back pain 07/13/2017  . Radiculopathy 05/04/2014    Orientation RESPIRATION BLADDER Height & Weight     Self, Time, Situation, Place  O2 Incontinent, External catheter Weight: 102.1 kg (225 lb) Height:  5\' 4"  (162.6 cm)  BEHAVIORAL SYMPTOMS/MOOD NEUROLOGICAL BOWEL NUTRITION STATUS      Incontinent Diet(Please see DC Summary)  AMBULATORY STATUS COMMUNICATION OF NEEDS Skin   Extensive Assist Verbally Surgical wounds, Skin abrasions(Closed incision on back; skin tear on arm)                       Personal Care Assistance Level of Assistance  Bathing, Feeding, Dressing Bathing Assistance: Limited assistance Feeding assistance: Independent Dressing Assistance: Limited assistance     Functional Limitations Info  Sight, Hearing, Speech Sight Info: Adequate Hearing Info: Adequate Speech Info: Adequate    SPECIAL CARE FACTORS FREQUENCY  PT (By licensed PT), OT (By licensed OT)     PT Frequency: 5x/week OT Frequency: 3x/week            Contractures       Additional Factors Info  Code Status, Allergies Code Status Info: Full Allergies Info: Ciprofloxacin, Cyclobenzaprine, Pregabalin, Fluvastatin, Meloxicam, Metaxalone, Pramipexole, Pravastatin, Rosuvastatin, Sertraline, Statins, Amitriptyline, Arthrotec Diclofenac-misoprostol, Augmentin Amoxicillin-pot Clavulanate, Bromfenac, Cephalexin, Conj Estrog-medroxyprogest Ace, Diclofenac-misoprostol, Erythromycin, Esomeprazole, Esomeprazole Magnesium, Levofloxacin, Methadone, Metoclopramide, Oxycodone, Propoxyphene, Rofecoxib, Sulfur, Tramadol, Zoloft Sertraline Hcl           Current Medications (08/29/2017):  This is the current hospital active medication list Current Facility-Administered Medications  Medication Dose Route Frequency Provider Last Rate Last Dose  . 0.9 %  sodium chloride infusion (Manually program via Guardrails IV Fluids)   Intravenous Once Gary Fleet, PA-C      . 0.9 %  sodium chloride infusion   Intravenous PRN Phylliss Bob, MD 10 mL/hr at 08/28/17 1534    . allopurinol (ZYLOPRIM) tablet 100 mg  100 mg Oral Daily McKenzie, Lennie Muckle, PA-C   100 mg at 08/29/17 0916  . amLODipine (NORVASC) tablet 10 mg  10 mg Oral Daily McKenzie, Kayla J, PA-C      . atenolol (TENORMIN) tablet 100 mg  100 mg Oral QHS McKenzie, Lennie Muckle, PA-C   100 mg at 08/28/17 2207  . azelastine (ASTELIN) 0.1 % nasal spray 1-2 spray  1-2 spray Each Nare BID PRN McKenzie, Kayla J, PA-C      . calcium-vitamin D (OSCAL WITH D) 500-200 MG-UNIT per tablet 1 tablet  1 tablet Oral Q breakfast McKenzie, Lennie Muckle, PA-C   1 tablet at 08/29/17  2671  . Chlorhexidine Gluconate Cloth 2 % PADS 6 each  6 each Topical Daily Gary Fleet, PA-C   6 each at 08/29/17 2458  . cholecalciferol (VITAMIN D) tablet 2,000 Units  2,000 Units Oral Daily Justice Britain, PA-C   2,000 Units at 08/29/17 0998  . cloNIDine (CATAPRES) tablet 0.1 mg  0.1 mg Oral QHS McKenzie, Lennie Muckle, PA-C   Stopped at 08/27/17 2210  . diphenhydrAMINE  (BENADRYL) injection 25 mg  25 mg Intravenous Q6H PRN Leighton Parody, PA-C   25 mg at 08/28/17 2322  . furosemide (LASIX) injection 20 mg  20 mg Intravenous Once Bethune, James, PA-C      . losartan (COZAAR) tablet 100 mg  100 mg Oral Daily McKenzie, Kayla J, PA-C   100 mg at 08/27/17 2156   And  . hydrochlorothiazide (HYDRODIURIL) tablet 25 mg  25 mg Oral Daily McKenzie, Lennie Muckle, PA-C   25 mg at 08/29/17 0916  . HYDROmorphone (DILAUDID) injection 0.5 mg  0.5 mg Intravenous Q2H PRN Leighton Parody, PA-C   0.5 mg at 08/28/17 1311  . HYDROmorphone (DILAUDID) tablet 1-2 mg  1-2 mg Oral Q4H PRN McKenzie, Lennie Muckle, PA-C   2 mg at 08/29/17 0915  . latanoprost (XALATAN) 0.005 % ophthalmic solution 1 drop  1 drop Both Eyes QHS McKenzie, Kayla J, PA-C   1 drop at 08/28/17 2205  . Melatonin TABS 9 mg  9 mg Oral QHS McKenzie, Lennie Muckle, PA-C   9 mg at 08/28/17 2205  . nortriptyline (PAMELOR) capsule 10 mg  10 mg Oral QHS McKenzie, Lennie Muckle, PA-C   10 mg at 08/28/17 2206  . ondansetron (ZOFRAN) injection 4 mg  4 mg Intravenous Q6H PRN Phylliss Bob, MD   4 mg at 08/29/17 0929  . pantoprazole (PROTONIX) EC tablet 40 mg  40 mg Oral Daily McKenzie, Lennie Muckle, PA-C   40 mg at 08/29/17 0915  . polyethylene glycol (MIRALAX / GLYCOLAX) packet 17 g  17 g Oral Daily McKenzie, Lennie Muckle, PA-C   17 g at 08/29/17 3382  . promethazine (PHENERGAN) injection 12.5 mg  12.5 mg Intravenous Q6H PRN Phylliss Bob, MD   12.5 mg at 08/28/17 2323  . psyllium (HYDROCIL/METAMUCIL) packet 1 packet  1 packet Oral Daily Dumonski, Elta [place], MD      . rifampin (RIFADIN) capsule 300 mg  300 mg Oral BID WC Carlyle Basques, MD   300 mg at 08/29/17 0914  . senna-docusate (Senokot-S) tablet 2 tablet  2 tablet Oral BID McKenzieLennie Muckle, PA-C   2 tablet at 08/29/17 0915  . sodium chloride flush (NS) 0.9 % injection 10-40 mL  10-40 mL Intracatheter Q12H Phylliss Bob, MD   10 mL at 08/29/17 0919  . sodium chloride flush (NS) 0.9 % injection 10-40 mL   10-40 mL Intracatheter PRN Phylliss Bob, MD      . spironolactone (ALDACTONE) tablet 25 mg  25 mg Oral Daily McKenzie, Lennie Muckle, PA-C   25 mg at 08/29/17 0914  . timolol (TIMOPTIC) 0.5 % ophthalmic solution 1 drop  1 drop Both Eyes Daily Sloan Leiter B, RPH   1 drop at 08/29/17 0919  . traZODone (DESYREL) tablet 200 mg  200 mg Oral QHS McKenzie, Lennie Muckle, PA-C   200 mg at 08/28/17 2206  . vancomycin (VANCOCIN) IVPB 750 mg/150 ml premix  750 mg Intravenous Q12H Sloan Leiter B, RPH 150 mL/hr at 08/29/17 0130 750 mg at 08/29/17 0130  Discharge Medications: Please see discharge summary for a list of discharge medications.  Relevant Imaging Results:  Relevant Lab Results:   Additional Information SS#: [place], [place]

## 2017-08-29 NOTE — Progress Notes (Addendum)
Subjective: 2 Days Post-Op Procedure(s) (LRB): LUMBAR 3 -4  LUMBAR FUSION REVISION WITH INSTRUMENTATION AND ALLOGRAFT.  TIME REQUESTED 4 HOURS (N/A) Patient reports pain as moderate.  Denies leg pain.  Denies dizziness.  Denies shortness of breath.  Yesterday which was relieved with IV Phenergan.  Objective: Vital signs in last 24 hours: Temp:  [98.1 F (36.7 C)-99.1 F (37.3 C)] 99.1 F (37.3 C) (06/22 0744) Pulse Rate:  [73-85] 76 (06/22 0744) Resp:  [16-21] 18 (06/22 0744) BP: (108-158)/(40-67) 117/46 (06/22 0744) SpO2:  [90 %-98 %] 98 % (06/22 0744)  Intake/Output from previous day: 06/21 0701 - 06/22 0700 In: 510 [P.O.:340; I.V.:20; IV Piggyback:150] Out: 181 [Drains:180; Stool:1] Intake/Output this shift: No intake/output data recorded.  Recent Labs    08/28/17 0840 08/29/17 0100  HGB 9.7* 8.0*   Recent Labs    08/28/17 0840 08/29/17 0100  WBC 13.1* 14.2*  RBC 3.46* 2.86*  HCT 29.9* 25.1*  PLT 314 295   Recent Labs    08/28/17 1135 08/29/17 0100  NA 134* 134*  K 4.2 3.1*  CL 100* 103  CO2 26 24  BUN 11 10  CREATININE 0.85 0.82  GLUCOSE 172* 124*  CALCIUM 9.0 8.4*   Drain output 80 cc at 1:30 AM last night.  Currently has 100 cc of drainage in the JP drain. Lumbar spine exam: Neurovascular intact Sensation intact distally Dorsiflexion/Plantar flexion intact Incision: dressing C/D/I Compartment soft  JP drain in place. Patient is alert and oriented.    Assessment/Plan: 2 Days Post-Op Procedure(s) (LRB): LUMBAR 3 -4  LUMBAR FUSION REVISION WITH INSTRUMENTATION AND ALLOGRAFT.  TIME REQUESTED 4 HOURS (N/A)  Postoperative anemia. Plan: Up with therapy  Encourage ambulation. Percocet p.o. for pain and Valium for spasms.  I discussed the case with Dr. Lynann Bologna.  We will transfuse 2 units of packed RBCs since she dropped her hemoglobin from 9.7 yesterday to 2.0 today.  Check CBC in a.m. Will be ready for discharge when drainage output is less than  60 cc per 8 hours.     Erlene Senters 08/29/2017, 8:42 AM

## 2017-08-29 NOTE — Clinical Social Work Note (Signed)
Clinical Social Work Assessment  Patient Details  Name: SAUSHA RAYMOND MRN: [id number] Date of Birth: [birth year]  Date of referral:  08/29/17               Reason for consult:  Facility Placement                Permission sought to share information with:  Facility Sport and exercise psychologist, Family Supports Permission granted to share information::  Yes, Verbal Permission Granted  Name::     Soil scientist::  SNFs  Relationship::  Daughter  Contact Information:  [phone]  Housing/Transportation Living arrangements for the past 2 months:  [place], [place] of Information:  Patient, Adult Children Patient Interpreter Needed:  None Criminal Activity/Legal Involvement Pertinent to Current Situation/Hospitalization:  No - Comment as needed Significant Relationships:  Adult Children Lives with:  Self Do you feel safe going back to the place where you live?  Yes Need for family participation in patient care:  Yes (Comment)  Care giving concerns:  CSW received consult for possible SNF placement at time of discharge. CSW attempted to speak with patient but she did not feel well and other staff members in the room. CSW contacted patient's daughter. Patient's daughter reported that patient came to the hospital from [facility] and prior to that had spent a few days at [facility], which they did not like. Patient would like to go to [facility] so she can be closer to her daughter. CSW to continue to follow and assist with discharge planning needs.   Social Worker assessment / plan:  CSW spoke with patient concerning possibility of rehab at [facility] before returning home.  Employment status:  Retired Forensic scientist:  Managed Care PT Recommendations:  [place] / Referral to community resources:  [place]  Patient/Family's Response to care:  Patient's daughter recognizes need for rehab before returning  home and is agreeable to returning to [place] if no other SNFs available or if [facility] is unable to accept her. CSW notes patient will require insurance pre-authorization and may be in her co-pay days.   Patient/Family's Understanding of and Emotional Response to Diagnosis, Current Treatment, and Prognosis:  Patient/family is realistic regarding therapy needs and expressed being hopeful for SNF placement closer to her family. Patient's daughter expressed understanding of CSW role and discharge process as well as medical condition. No questions/concerns about plan or treatment.    Emotional Assessment Appearance:  Appears stated age Attitude/Demeanor/Rapport:  Other(Appropriate but felt sick) Affect (typically observed):  Appropriate, Accepting Orientation:  Oriented to Self, Oriented to Place, Oriented to  Time, Oriented to Situation Alcohol / Substance use:  Not Applicable Psych involvement (Current and /or in the community):  No (Comment)  Discharge Needs  Concerns to be addressed:  Care Coordination Readmission within the last 30 days:  No Current discharge risk:  None Barriers to Discharge:  Continued Medical Work up   [employer], [place] 08/29/2017, 11:30 AM

## 2017-08-29 NOTE — Evaluation (Signed)
Physical Therapy Evaluation Patient Details Name: Karen Castillo MRN: [id number] DOB: 07/04/1940 Today's Date: 08/29/2017   History of Present Illness  Karen Castillo is a 77 y.o. female s/p revision fusion procedure admitted on 08/27/2017 with possible wound infection.  Recent L5-S1 fusion on 07/09/17.  HX GERD and Bipolar.   Clinical Impression  Pt admitted with above diagnosis. Pt currently with functional limitations due to the deficits listed below (see PT Problem List). Pt was only able to transfer to recliner due to pt self limiting.  Pt refused to ambulate and needs max encouragement for all mobility tasks.  Agreed to sit up for a short time.  Noted that pt returned to bed only 30 min after PT left her in chair.  Pt highly anxious.  Will follow acutely and plan is to return to SNF for more therapy.  Pt was at [facility] but states that daughter has found a SNF closer to [facility] but she can't remember the name.  Pt will benefit from skilled PT to increase their independence and safety with mobility to allow discharge to the venue listed below.      Follow Up Recommendations SNF;Supervision/Assistance - 24 hour    Equipment Recommendations  Other (comment)(TBA)    Recommendations for Other Services       Precautions / Restrictions Precautions Precautions: Fall;Back Precaution Booklet Issued: Yes (comment) Precaution Comments: Pt states she knows the precautions but needed cues to follow them Required Braces or Orthoses: Spinal Brace Spinal Brace: Applied in sitting position;Thoracolumbosacral orthotic Restrictions Weight Bearing Restrictions: No      Mobility  Bed Mobility               General bed mobility comments: Up on 3N1 on arrival with NT in room  Transfers Overall transfer level: Needs assistance Equipment used: Rolling walker (2 wheeled) Transfers: Sit to/from [employer] Transfers Sit to Stand: Min assist;Mod assist;+2 physical assistance Stand  pivot transfers: Min assist;Mod assist;+2 physical assistance       General transfer comment: Pt needed mod assist to power up with 2 attempts as pt gets anxious and needs > assist.  Pt needed max encouragement to step around to chair.  Once in chair, placed brace on as it wasn't on due to NT getting pt to 3N1 quickly prior to PT arrival.  Pt needed cues and assist to don brace.  Tried to get pt to walk with PT.  Pt stood from 3N1 with min assist and cues to RW and yelled, " I am peeing".  assisted with catching urine as it was trickling.  Pt states, "I have to sit down because I am going to pass out and I am sick."  Allowed pt to sit (no signs of passing out) and pt continued to c/o nausea.  Tried to redirect pt but she refused to try and get up again or walk.  Cleaned pt and placed pad between legs due to incontinence and pt refused to stand again.   Ambulation/Gait             General Gait Details: Refused to ambulate  Stairs            Wheelchair Mobility    Modified Rankin (Stroke Patients Only)       Balance Overall balance assessment: Needs assistance;History of Falls Sitting-balance support: No upper extremity supported;Feet supported Sitting balance-Leahy Scale: Fair     Standing balance support: Bilateral upper extremity supported;During functional activity Standing balance-Leahy Scale: Poor  Standing balance comment: relies on UE support on RW                             Pertinent Vitals/Pain Pain Assessment: 0-10 Pain Score: 8  Pain Location: back Pain Descriptors / Indicators: Aching;Grimacing;Guarding Pain Intervention(s): Limited activity within patient's tolerance;Monitored during session;Repositioned;Patient requesting pain meds-RN notified;RN gave pain meds during session  VSS  Home Living Family/patient expects to be discharged to:: Skilled nursing facility Living Arrangements: Children Available Help at Discharge: Family;Available  PRN/intermittently Type of Home: House Home Access: Stairs to enter Entrance Stairs-Rails: None Entrance Stairs-Number of Steps: 1 Home Layout: Two level;Able to live on main level with bedroom/bathroom Home Equipment: Gilford Rile - 2 wheels;Cane - quad;Bedside commode;Shower seat;Grab bars - tub/shower;Hand held shower head;Adaptive equipment      Prior Function Level of Independence: Needs assistance   Gait / Transfers Assistance Needed: unable since surgery  ADL's / Homemaking Assistance Needed: total since surgery  Comments: was independent with RW prior to surgery     Hand Dominance   Dominant Hand: Right    Extremity/Trunk Assessment   Upper Extremity Assessment Upper Extremity Assessment: Defer to OT evaluation    Lower Extremity Assessment Lower Extremity Assessment: Generalized weakness    Cervical / Trunk Assessment Cervical / Trunk Assessment: Normal  Communication   Communication: No difficulties  Cognition Arousal/Alertness: Awake/alert Behavior During Therapy: Anxious;Impulsive Overall Cognitive Status: Within Functional Limits for tasks assessed                                        General Comments      Exercises     Assessment/Plan    PT Assessment Patient needs continued PT services  PT Problem List Decreased activity tolerance;Decreased balance;Decreased mobility;Decreased knowledge of use of DME;Decreased safety awareness;Decreased knowledge of precautions;Pain;Obesity       PT Treatment Interventions DME instruction;Gait training;Functional mobility training;Therapeutic activities;Therapeutic exercise;Balance training;Patient/family education    PT Goals (Current goals can be found in the Care Plan section)  Acute Rehab PT Goals Patient Stated Goal: to go home PT Goal Formulation: With patient Time For Goal Achievement: 09/12/17 Potential to Achieve Goals: Good    Frequency Min 3X/week   Barriers to discharge  Decreased caregiver support      Co-evaluation               AM-PAC PT "6 Clicks" Daily Activity  Outcome Measure Difficulty turning over in bed (including adjusting bedclothes, sheets and blankets)?: Unable Difficulty moving from lying on back to sitting on the side of the bed? : Unable Difficulty sitting down on and standing up from a chair with arms (e.g., wheelchair, bedside commode, etc,.)?: A Lot Help needed moving to and from a bed to chair (including a wheelchair)?: A Lot Help needed walking in hospital room?: Total Help needed climbing 3-5 steps with a railing? : Total 6 Click Score: 8    End of Session Equipment Utilized During Treatment: Gait belt Activity Tolerance: Patient limited by fatigue;Patient limited by pain Patient left: in chair;with call bell/phone within reach;with nursing/sitter in room Nurse Communication: Mobility status(request nausea med and nurse gave it) PT Visit Diagnosis: Unsteadiness on feet (R26.81);Muscle weakness (generalized) (M62.81);Pain Pain - part of body: (back)    Time: 0947-0962 PT Time Calculation (min) (ACUTE ONLY): 25 min   Charges:   PT  Evaluation $PT Eval Moderate Complexity: 1 Mod PT Treatments $Therapeutic Activity: 8-22 mins   PT G Codes:        [place] Carle Dargan,PT Acute Rehabilitation [phone] (pager)   Denice Paradise 08/29/2017, 10:31 AM

## 2017-08-29 NOTE — Plan of Care (Signed)
  Problem: Activity: Goal: Ability to avoid complications of mobility impairment will improve Outcome: Progressing   Problem: Physical Regulation: Goal: Ability to maintain clinical measurements within normal limits will improve Outcome: Progressing

## 2017-08-29 NOTE — Progress Notes (Signed)
Pharmacy Antibiotic Note  Karen Castillo is a 77 y.o. female s/p revision fusion procedure admitted on 08/27/2017 with possible wound infection.  Pharmacy has been consulted for Vancomycin dosing. ID consulted. Patient had revision today. WBC is within normal limits. Patient is afebrile. Last SCr on 6/18 was stable at 0.77. She received Vancomycin 1500mg  at 1339PM.   6/22 AM update: vancomycin trough is therapeutic at 15, renal function remains stable  Plan: Continue Vancomycin at 750 mg IV every 12 hours Planning for 4 weeks of therapy Re-check drug levels as needed   Height: 5\' 4"  (162.6 cm) Weight: 225 lb (102.1 kg) IBW/kg (Calculated) : 54.7  Temp (24hrs), Avg:98.4 F (36.9 C), Min:98.1 F (36.7 C), Max:98.6 F (37 C)  Recent Labs  Lab 08/25/17 1559 08/28/17 0840 08/28/17 1135 08/29/17 0100  WBC 7.6 13.1*  --  14.2*  CREATININE 0.77  --  0.85  --   VANCOTROUGH  --   --   --  15    Estimated Creatinine Clearance: 66.5 mL/min (by C-G formula based on SCr of 0.85 mg/dL).    Allergies  Allergen Reactions  . Ciprofloxacin Swelling    Tongue swells  . Cyclobenzaprine Swelling    Feet swell   . Pregabalin Anaphylaxis, Shortness Of Breath, Swelling and Other (See Comments)    Dizziness also  . Fluvastatin Other (See Comments)    Leg pain   . Meloxicam Swelling    Feet became swollen  . Metaxalone Rash and Other (See Comments)    Flushing of the skin and sore throat, too  . Pramipexole Nausea And Vomiting  . Pravastatin Other (See Comments)    Leg pain  . Rosuvastatin Other (See Comments)    Leg pain  . Sertraline Other (See Comments)    Sensitive teeth  . Statins Other (See Comments)    Leg pain     . Amitriptyline Other (See Comments)    Weird feeling all over  . Arthrotec [Diclofenac-Misoprostol] Diarrhea  . Augmentin [Amoxicillin-Pot Clavulanate] Diarrhea    Has patient had a PCN reaction causing immediate rash, facial/tongue/throat swelling, SOB or  lightheadedness with hypotension: No Has patient had a PCN reaction causing severe rash involving mucus membranes or skin necrosis: No Has patient had a PCN reaction that required hospitalization: No Has patient had a PCN reaction occurring within the last 10 years: No If all of the above answers are "NO", then may proceed with Cephalosporin use.   . Bromfenac Diarrhea and Nausea Only  . Cephalexin Diarrhea and Nausea Only  . Conj Estrog-Medroxyprogest Ace Other (See Comments)    Unsure of reaction type Couldn't tolerate  . Diclofenac-Misoprostol Nausea Only and Diarrhea  . Erythromycin Diarrhea and Nausea Only  . Esomeprazole Diarrhea  . Esomeprazole Magnesium Nausea Only  . Levofloxacin Other (See Comments) and Nausea Only    Stomach upset  . Methadone Diarrhea  . Metoclopramide Diarrhea and Nausea Only  . Oxycodone Hives, Swelling and Rash  . Propoxyphene Diarrhea and Nausea Only  . Rofecoxib Diarrhea and Nausea Only  . Sulfur Diarrhea and Nausea Only  . Tramadol Diarrhea  . Zoloft [Sertraline Hcl] Other (See Comments)    Sensitivity with teeth   Narda Bonds, PharmD, BCPS Clinical Pharmacist Phone: [phone]

## 2017-08-30 LAB — TYPE AND SCREEN
ABO/RH(D): O POS
Antibody Screen: NEGATIVE
UNIT DIVISION: 0
UNIT DIVISION: 0

## 2017-08-30 LAB — BPAM RBC
Blood Product Expiration Date: 201907202359
Blood Product Expiration Date: 201907202359
ISSUE DATE / TIME: 201906221746
ISSUE DATE / TIME: 201906221249
UNIT TYPE AND RH: 5100
Unit Type and Rh: 5100

## 2017-08-30 MED ORDER — BISACODYL 10 MG RE SUPP
10.0000 mg | Freq: Every day | RECTAL | Status: DC | PRN
  Administered 2017-08-30: 10 mg via RECTAL
  Filled 2017-08-30 (×2): qty 1

## 2017-08-30 MED ORDER — MAGNESIUM HYDROXIDE 400 MG/5ML PO SUSP
15.0000 mL | Freq: Every day | ORAL | Status: DC | PRN
  Administered 2017-08-30: 15 mL via ORAL
  Filled 2017-08-30 (×2): qty 30

## 2017-08-30 NOTE — Progress Notes (Signed)
Subjective: 3 Days Post-Op Procedure(s) (LRB): LUMBAR 3 -4  LUMBAR FUSION REVISION WITH INSTRUMENTATION AND ALLOGRAFT.  TIME REQUESTED 4 HOURS (N/A) Patient reports pain as moderate.  Denies leg pain.  Feels much better after receiving 2 units of packed RBCs yesterday.  No longer feels "tired "no longer feels dizzy.  Objective: Vital signs in last 24 hours: Temp:  [97.7 F (36.5 C)-100 F (37.8 C)] 98.1 F (36.7 C) (06/23 0800) Pulse Rate:  [66-86] 71 (06/23 0900) Resp:  [12-23] 12 (06/23 0900) BP: (124-176)/(33-72) 134/46 (06/23 0800) SpO2:  [91 %-99 %] 96 % (06/23 0900)  Intake/Output from previous day: 06/22 0701 - 06/23 0700 In: 445.7 [I.V.:13; Blood:432.7] Out: 2000 [Urine:1950; Drains:50] Intake/Output this shift: Total I/O In: -  Out: 10 [Drains:10]  Recent Labs    08/28/17 0840 08/29/17 0100  HGB 9.7* 8.0*   Recent Labs    08/28/17 0840 08/29/17 0100  WBC 13.1* 14.2*  RBC 3.46* 2.86*  HCT 29.9* 25.1*  PLT 314 295   Recent Labs    08/28/17 1135 08/29/17 0100  NA 134* 134*  K 4.2 3.1*  CL 100* 103  CO2 26 24  BUN 11 10  CREATININE 0.85 0.82  GLUCOSE 172* 124*  CALCIUM 9.0 8.4*  Awaiting CBC results from this morning.  50 cc of JP drainage last night at 930 10 cc this a.m. The patient is in much better spirits today. Lumbar spine exam: Some drainage on dressing.  It is serous fluid.  No frank sign of infection at wound. Bilateral calves are soft.  Excellent ankle plantar and dorsiflexor strength.    Assessment/Plan: 3 Days Post-Op Procedure(s) (LRB): LUMBAR 3 -4  LUMBAR FUSION REVISION WITH INSTRUMENTATION AND ALLOGRAFT.  TIME REQUESTED 4 HOURS (N/A) Acute blood loss anemia.  Status post transfusion of 2 units packed RBCs yesterday. Plan: Check CBC this a.m. Up with therapy Discharge to SNF hopefully tomorrow. Wear back brace when up. Drain pulled this a.m. Continue IV antibiotics per ID.       Erlene Senters 08/30/2017, 9:23 AM

## 2017-08-30 NOTE — Progress Notes (Signed)
Pt complaines of abd pain. States feels like she needs to have a BM. Pt had small BM requiring straining earlier today. States pain in abdomen is worse than post op back pain. Notified Gaspar Skeeters, [place]. Ordered suppository and milk of mag if needed. Pt is very happy with plan. Will cont to monitor.

## 2017-08-30 NOTE — Evaluation (Signed)
Occupational Therapy Evaluation Patient Details Name: Karen Castillo MRN: [id number] DOB: [birth year] Today's Date: 08/30/2017    History of Present Illness Karen Castillo is a 77 y.o. female s/p revision fusion procedure admitted on 08/27/2017 with possible wound infection.  Recent L5-S1 fusion on 07/09/17.  HX GERD and Bipolar.    Clinical Impression   PTA, pt was at [facility] for rehab after back fusion on 5/2. Pt required assistance for BADLs and functional mobility PTA. Pt currently requiring Min-Mod A for UB ADLs, Max A for LB ADLs, and Mod A for toileting. Pt verbalizing understanding of back precautions and demonstrating good adherence. Pt would benefit from further acute OT to facilitate safe dc. Recommend dc return to SNF for further OT to optimize safety, independence with ADLs, and return to PLOF.      Follow Up Recommendations  SNF    Equipment Recommendations  Other (comment)(Defer to next venue)    Recommendations for Other Services PT consult     Precautions / Restrictions Precautions Precautions: Fall;Back Precaution Booklet Issued: Yes (comment) Precaution Comments: Pt able to verbalize 3/3 back precautions Required Braces or Orthoses: Spinal Brace Spinal Brace: Applied in sitting position;Thoracolumbosacral orthotic Restrictions Weight Bearing Restrictions: No      Mobility Bed Mobility Overal bed mobility: Needs Assistance Bed Mobility: Rolling;Sidelying to Sit;Sit to Sidelying Rolling: Min assist Sidelying to sit: Min assist     Sit to sidelying: Mod assist General bed mobility comments: Min A for rolling and then pushing up into sitting. Mod A for BLEs management to return to supine  Transfers Overall transfer level: Needs assistance Equipment used: Rolling walker (2 wheeled) Transfers: Sit to/from Stand Sit to Stand: Min assist;Mod assist;+2 physical assistance         General transfer comment: Mod A to power up into standing from EOB. Min A from [facility].  VCs for hand palcement and weight shifting    Balance Overall balance assessment: Needs assistance;History of Falls Sitting-balance support: No upper extremity supported;Feet supported Sitting balance-Leahy Scale: Fair     Standing balance support: Bilateral upper extremity supported;During functional activity Standing balance-Leahy Scale: Poor Standing balance comment: relies on UE support on RW                           ADL either performed or assessed with clinical judgement   ADL Overall ADL's : Needs assistance/impaired Eating/Feeding: Set up;Sitting   Grooming: Set up;Supervision/safety;Sitting;Wash/dry hands   Upper Body Bathing: Moderate assistance;Sitting   Lower Body Bathing: Maximal assistance;Sit to/from stand   Upper Body Dressing : Sitting;Minimal assistance Upper Body Dressing Details (indicate cue type and reason): Pt donning back brace with Min A at EOB Lower Body Dressing: Maximal assistance;Sit to/from stand   Toilet Transfer: Ambulation;BSC;RW;Minimal assistance Toilet Transfer Details (indicate cue type and reason): Min A for safety in descent and to power up into standing Toileting- Clothing Manipulation and Hygiene: Moderate assistance;Cueing for safety;Cueing for sequencing;Sit to/from stand Toileting - Clothing Manipulation Details (indicate cue type and reason): Pt able to perform peri care with Mod A for posterior lean. Requiring Mod A to make sure she is clean at backside     Functional mobility during ADLs: Minimal assistance;Rolling walker General ADL Comments: Pt demonstrating decreased functional performance and is impacted by decreased strength, balance, and activity tolerance     Vision         Perception     Praxis  Pertinent Vitals/Pain Pain Assessment: Faces Faces Pain Scale: Hurts even more Pain Location: back Pain Descriptors / Indicators: Aching;Grimacing;Guarding Pain Intervention(s): Monitored during  session;Limited activity within patient's tolerance;Repositioned     Hand Dominance Right   Extremity/Trunk Assessment Upper Extremity Assessment Upper Extremity Assessment: Generalized weakness   Lower Extremity Assessment Lower Extremity Assessment: Generalized weakness   Cervical / Trunk Assessment Cervical / Trunk Assessment: Other exceptions Cervical / Trunk Exceptions: s/p L5-S1 fusion   Communication Communication Communication: No difficulties   Cognition Arousal/Alertness: Awake/alert Behavior During Therapy: WFL for tasks assessed/performed;Anxious Overall Cognitive Status: Within Functional Limits for tasks assessed                                 General Comments: Axious about pain. Pt agreeable to therapy. Able to follow simple commands   General Comments  Pt becoming dizzy at end of session. SpO2, BP, and HR stable    Exercises     Shoulder Instructions      Home Living Family/patient expects to be discharged to:: Skilled nursing facility Living Arrangements: Children Available Help at Discharge: Family;Available PRN/intermittently Type of Home: House Home Access: Stairs to enter [employer] of Steps: 1 Entrance Stairs-Rails: None Home Layout: Two level;Able to live on main level with bedroom/bathroom     Bathroom Shower/Tub: Occupational psychologist: Handicapped height Bathroom Accessibility: Yes   Home Equipment: Environmental consultant - 2 wheels;Cane - quad;Bedside commode;Shower seat;Grab bars - tub/shower;Hand held shower head;Adaptive equipment Adaptive Equipment: Reacher;Sock aid;Long-handled shoe horn;Long-handled sponge Additional Comments: Planning on dc to [facility] for rehab      Prior Functioning/Environment Level of Independence: Needs assistance  Gait / Transfers Assistance Needed: Functional mobility with RW and therapy staff at SNF ADL's / Homemaking Assistance Needed: Assist at SNF   Comments: was independent  with RW prior to surgery        OT Problem List: Decreased strength;Decreased range of motion;Impaired balance (sitting and/or standing);Decreased activity tolerance;Decreased knowledge of use of DME or AE;Decreased knowledge of precautions;Pain      OT Treatment/Interventions: Self-care/ADL training;Therapeutic exercise;Energy conservation;DME and/or AE instruction;Therapeutic activities;Patient/family education    OT Goals(Current goals can be found in the care plan section) Acute Rehab OT Goals Patient Stated Goal: to go home OT Goal Formulation: With patient Time For Goal Achievement: 09/13/17 Potential to Achieve Goals: Good ADL Goals Pt Will Perform Grooming: with set-up;with supervision;standing Pt Will Perform Lower Body Dressing: with set-up;with supervision;with adaptive equipment;sit to/from stand Pt Will Transfer to Toilet: with set-up;with supervision;bedside commode;ambulating Pt Will Perform Toileting - Clothing Manipulation and hygiene: with set-up;with supervision;with adaptive equipment  OT Frequency: Min 2X/week   Barriers to D/C:            Co-evaluation              AM-PAC PT "6 Clicks" Daily Activity     Outcome Measure Help from another person eating meals?: None Help from another person taking care of personal grooming?: A Little Help from another person toileting, which includes using toliet, bedpan, or urinal?: A Lot Help from another person bathing (including washing, rinsing, drying)?: A Lot Help from another person to put on and taking off regular upper body clothing?: A Little Help from another person to put on and taking off regular lower body clothing?: A Lot 6 Click Score: 16   End of Session Equipment Utilized During Treatment: Rolling walker;Back brace Nurse Communication:  Mobility status;Precautions  Activity Tolerance: Patient tolerated treatment well;Patient limited by pain Patient left: in bed;with call bell/phone within  reach  OT Visit Diagnosis: Unsteadiness on feet (R26.81);Other abnormalities of gait and mobility (R26.89);Muscle weakness (generalized) (M62.81);Pain Pain - part of body: (Back)                Time: 8473-0856 OT Time Calculation (min): 31 min Charges:  OT General Charges $OT Visit: 1 Visit OT Evaluation $OT Eval Moderate Complexity: 1 Mod OT Treatments $Self Care/Home Management : 8-22 mins G-Codes:     [place], OTR/L Acute Rehab Pager: [phone] Office: [place] 08/30/2017, 5:36 PM

## 2017-08-30 NOTE — Progress Notes (Signed)
Pt complains of pain in her stomach 10/10 progressing after she was up in the chair (pt is back in the bed). Pt denies nausea and/or vomitus.  No medication available on MAR to administer at this time. Oral dilaudid was given 0919 .  Pt states she had a bowel movement this morning and last night  Nurse offered a heating pad to apply to the area.  Upon assessment stomach is obese but not taut or distended and bowel sounds are hypoactive in each quadrant. Stomach is tender to touch

## 2017-08-31 ENCOUNTER — Inpatient Hospital Stay (HOSPITAL_COMMUNITY): Payer: PPO

## 2017-08-31 DIAGNOSIS — K5900 Constipation, unspecified: Secondary | ICD-10-CM

## 2017-08-31 DIAGNOSIS — R140 Abdominal distension (gaseous): Secondary | ICD-10-CM

## 2017-08-31 DIAGNOSIS — R110 Nausea: Secondary | ICD-10-CM

## 2017-08-31 DIAGNOSIS — R109 Unspecified abdominal pain: Secondary | ICD-10-CM

## 2017-08-31 DIAGNOSIS — R6883 Chills (without fever): Secondary | ICD-10-CM

## 2017-08-31 LAB — CBC WITH DIFFERENTIAL/PLATELET
Abs Immature Granulocytes: 0.1 10*3/uL (ref 0.0–0.1)
BASOS ABS: 0 10*3/uL (ref 0.0–0.1)
Basophils Relative: 0 %
EOS PCT: 4 %
Eosinophils Absolute: 0.4 10*3/uL (ref 0.0–0.7)
HEMATOCRIT: 32.8 % — AB (ref 36.0–46.0)
HEMOGLOBIN: 11.1 g/dL — AB (ref 12.0–15.0)
Immature Granulocytes: 1 %
LYMPHS PCT: 19 %
Lymphs Abs: 1.8 10*3/uL (ref 0.7–4.0)
MCH: 28.8 pg (ref 26.0–34.0)
MCHC: 33.8 g/dL (ref 30.0–36.0)
MCV: 85.2 fL (ref 78.0–100.0)
MONO ABS: 1 10*3/uL (ref 0.1–1.0)
Monocytes Relative: 10 %
Neutro Abs: 6.3 10*3/uL (ref 1.7–7.7)
Neutrophils Relative %: 66 %
Platelets: 276 10*3/uL (ref 150–400)
RBC: 3.85 MIL/uL — ABNORMAL LOW (ref 3.87–5.11)
RDW: 13.4 % (ref 11.5–15.5)
WBC: 9.6 10*3/uL (ref 4.0–10.5)

## 2017-08-31 LAB — CBC
HEMATOCRIT: 32.4 % — AB (ref 36.0–46.0)
Hemoglobin: 10.7 g/dL — ABNORMAL LOW (ref 12.0–15.0)
MCH: 29 pg (ref 26.0–34.0)
MCHC: 33 g/dL (ref 30.0–36.0)
MCV: 87.8 fL (ref 78.0–100.0)
Platelets: 235 10*3/uL (ref 150–400)
RBC: 3.69 MIL/uL — ABNORMAL LOW (ref 3.87–5.11)
RDW: 14 % (ref 11.5–15.5)
WBC: 8.6 10*3/uL (ref 4.0–10.5)

## 2017-08-31 LAB — COMPREHENSIVE METABOLIC PANEL
ALT: 14 U/L (ref 14–54)
ANION GAP: 11 (ref 5–15)
AST: 19 U/L (ref 15–41)
Albumin: 2.7 g/dL — ABNORMAL LOW (ref 3.5–5.0)
Alkaline Phosphatase: 95 U/L (ref 38–126)
BILIRUBIN TOTAL: 1.2 mg/dL (ref 0.3–1.2)
BUN: 5 mg/dL — ABNORMAL LOW (ref 6–20)
CALCIUM: 9.3 mg/dL (ref 8.9–10.3)
CO2: 27 mmol/L (ref 22–32)
Chloride: 90 mmol/L — ABNORMAL LOW (ref 101–111)
Creatinine, Ser: 0.92 mg/dL (ref 0.44–1.00)
GFR calc non Af Amer: 59 mL/min — ABNORMAL LOW (ref >60)
Glucose, Bld: 104 mg/dL — ABNORMAL HIGH (ref 65–99)
Potassium: 2.9 mmol/L — ABNORMAL LOW (ref 3.5–5.1)
SODIUM: 128 mmol/L — AB (ref 135–145)
Total Protein: 5.8 g/dL — ABNORMAL LOW (ref 6.5–8.1)

## 2017-08-31 LAB — LIPASE, BLOOD: LIPASE: 46 U/L (ref 11–51)

## 2017-08-31 LAB — TROPONIN I: Troponin I: <0.03 ng/mL (ref <0.03)

## 2017-08-31 MED ORDER — SODIUM CHLORIDE 0.9 % IV SOLN
2.0000 g | INTRAVENOUS | Status: DC
  Administered 2017-08-31 – 2017-09-04 (×5): 2 g via INTRAVENOUS
  Filled 2017-08-31 (×5): qty 20

## 2017-08-31 MED ORDER — POTASSIUM CHLORIDE CRYS ER 20 MEQ PO TBCR
60.0000 meq | EXTENDED_RELEASE_TABLET | Freq: Once | ORAL | Status: AC
  Administered 2017-08-31: 60 meq via ORAL
  Filled 2017-08-31: qty 3

## 2017-08-31 MED ORDER — POTASSIUM CHLORIDE CRYS ER 10 MEQ PO TBCR
EXTENDED_RELEASE_TABLET | ORAL | Status: AC
  Administered 2017-08-31: 19:00:00
  Filled 2017-08-31: qty 5

## 2017-08-31 MED ORDER — SODIUM CHLORIDE 0.9 % IV SOLN
1.0000 g | Freq: Two times a day (BID) | INTRAVENOUS | Status: DC
  Filled 2017-08-31: qty 10

## 2017-08-31 NOTE — Progress Notes (Addendum)
[facility] for Infectious Disease    Date of Admission:  08/27/2017   Total days of antibiotics 5        Day 5 vanco/day 4 rif   ID: Karen Castillo is a 77 y.o. female with presumed deep tissue/HW infection Active Problems:   Lower back pain   Postoperative anemia due to acute blood loss    Subjective: She reports feeling poorly with chills, nausea, and abdominal pain and distention. She has been able to go to the bathroom (initially had some constipation, but now having BM). She has just finished abd CT read pending  Medications:  . allopurinol  100 mg Oral Daily  . amLODipine  10 mg Oral Daily  . atenolol  100 mg Oral QHS  . calcium-vitamin D  1 tablet Oral Q breakfast  . Chlorhexidine Gluconate Cloth  6 each Topical Daily  . cholecalciferol  2,000 Units Oral Daily  . cloNIDine  0.1 mg Oral QHS  . losartan  100 mg Oral Daily   And  . hydrochlorothiazide  25 mg Oral Daily  . latanoprost  1 drop Both Eyes QHS  . Melatonin  9 mg Oral QHS  . nortriptyline  10 mg Oral QHS  . pantoprazole  40 mg Oral Daily  . polyethylene glycol  17 g Oral Daily  . psyllium  1 packet Oral Daily  . senna-docusate  2 tablet Oral BID  . sodium chloride flush  10-40 mL Intracatheter Q12H  . spironolactone  25 mg Oral Daily  . timolol  1 drop Both Eyes Daily  . traZODone  200 mg Oral QHS    Objective: Vital signs in last 24 hours: Temp:  [97.9 F (36.6 C)-99.4 F (37.4 C)] 98.1 F (36.7 C) (06/24 0749) Pulse Rate:  [71-82] 73 (06/24 0749) Resp:  [11-20] 11 (06/24 0749) BP: (108-162)/(46-95) 108/88 (06/24 0749) SpO2:  [92 %-98 %] 96 % (06/24 0749) Physical Exam  Constitutional:  oriented to person, place, and time. appears well-developed and well-nourished. Appears in mild distress. diaphoretic HENT: [place]/AT, PERRLA, no scleral icterus Mouth/Throat: Oropharynx is clear and moist. No oropharyngeal exudate.  Cardiovascular: Normal rate, regular rhythm and normal heart sounds. Exam  reveals no gallop and no friction rub.  No murmur heard.  Pulmonary/Chest: Effort normal and breath sounds normal. No respiratory distress.  has no wheezes.  Abdominal: Bowel sounds are slow  moderate distension. There is no tenderness.  Lymphadenopathy: no cervical adenopathy. No axillary adenopathy Neurological: alert and oriented to person, place, and time.  Skin: Skin is warm and dry. No rash noted. No erythema.  Psychiatric: a normal mood and affect.  behavior is normal.   Lab Results Recent Labs    08/29/17 0100 08/30/17 0756  WBC 14.2* 8.6  HGB 8.0* 10.7*  HCT 25.1* 32.4*  NA 134*  --   K 3.1*  --   CL 103  --   CO2 24  --   BUN 10  --   CREATININE 0.82  --    Liver Panel Recent Labs    08/29/17 0100  PROT 4.9*  ALBUMIN 2.4*  AST 19  ALT 12*  ALKPHOS 74  BILITOT 0.6   Lab Results  Component Value Date   ESRSEDRATE 55 (H) 08/28/2017    Microbiology: 6/20 swab cx ngtd Studies/Results: Ct Abdomen Pelvis Wo Contrast  Result Date: 08/31/2017 CLINICAL DATA:  Abdominal pain.  Nausea and vomiting EXAM: CT ABDOMEN AND PELVIS WITHOUT CONTRAST TECHNIQUE: Multidetector CT  imaging of the abdomen and pelvis was performed following the standard protocol without IV contrast. COMPARISON:  None. FINDINGS: Lower chest: Coronary atherosclerotic calcification. Mild dependent atelectasis. 34 x 20 mm right cardiophrenic sulcus cyst. Hepatobiliary: No focal liver abnormality.Cholecystectomy with normal common bile duct diameter. Pancreas: Unremarkable. Spleen: Unremarkable. Adrenals/Urinary Tract: Negative adrenals. No hydronephrosis or stone. Mild generalized prominence of bladder wall thickness without convincing perivesicular fat stranding. Stomach/Bowel: No obstruction. No appendicitis. Moderate stool volume Vascular/Lymphatic: No acute vascular abnormality. No mass or adenopathy. Reproductive:Unremarkable for age. Other: No ascites or pneumoperitoneum. Musculoskeletal: Spinal  findings reported on dedicated exam. IMPRESSION: 1. Borderline bladder wall thickening and perivesicular stranding, please correlate for cystitis symptoms. 2. Moderate stool volume without obstruction or rectal impaction. 3.  Aortic Atherosclerosis (ICD10-I70.0).  Coronary atherosclerosis. Electronically Signed   By: Monte Fantasia M.D.   On: 08/31/2017 14:59   Ct Thoracic Spine Wo Contrast  Result Date: 08/31/2017 CLINICAL DATA:  Abdominal pain. Right lateral thigh pain from hip to knee. EXAM: CT THORACIC AND LUMBAR SPINE WITHOUT CONTRAST TECHNIQUE: Multidetector CT imaging of the thoracic and lumbar spine was performed without contrast. Multiplanar CT image reconstructions were also generated. COMPARISON:  Preoperative lumbar spine CT 08/19/2017 FINDINGS: CT THORACIC SPINE FINDINGS Alignment: Exaggerated thoracic kyphosis without listhesis. Vertebrae: T10 to L1/2 posterior fusion as described below. Lucency around bilateral T10 pedicle screws where there is no demonstrable solid posterior arthrodesis. 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 No acute fracture Paraspinal and other soft tissues: No acute finding Disc levels: Spondylosis in the mid and lower thoracic spine. Postoperative findings noted above. Lower thoracic facet spurring. No visible cord impingement. CT LUMBAR SPINE FINDINGS Segmentation: 5 lumbar type vertebral bodies Alignment: Physiologic Vertebrae: Posterior fusion on the right from L1 to the ilium and on the left from L2 to the ilium. Iliac screws are new. There is ALIF plate at X8-P3 and XLIF plate at A2-5. the posterior rods have been revised to span a L3 fracture extending obliquely across the vertebral body and into the right posterior elements. Remote L2 superior endplate fracture. Paraspinal and other soft tissues: Soft tissue gas within the laminectomy defect of L2-L4, correlating with recent  surgery. There is superimposed incisional fluid without demonstrable mass effect on the thecal sac. Retroperitoneum is reported separately on dedicated abdominal CT Disc levels: T12- L1: Posterior-lateral solid arthrodesis. No visible impingement L1-L2: Posterior-lateral solid arthrodesis.  No visible impingement L2-L3: Discectomy and posterolateral fusion. Residual spurring chronically narrows the foramina. Laminectomy. Hardware obscures the canal. L3-L4: Solid fusion.  No visible impingement. L4-L5: Solid arthrodesis.  No visible impingement. L5-S1:Prior pseudoarthrosis with revision. The discectomy graft shows incorporation. Vacuum phenomenon has resolved within the disc space. No progressive lucency around the S1 screws. IMPRESSION: 1. Thoracic and lumbar spine fusion with recent revision to span L3 body and posterior element fractures. No unexpected finding. 2. Recent revised L5-S1 level with incorporating bone graft. 3. Bilateral lucency around T10 pedicle screws. No demonstrable T10-11 posterior-lateral arthrodesis. Electronically Signed   By: Monte Fantasia M.D.   On: 08/31/2017 14:54   Ct L-spine No Charge  Result Date: 08/31/2017 CLINICAL DATA:  Postop pain. Vertebral body fracture. Lumbar fusion revision 08/27/2017 EXAM: CT LUMBAR SPINE WITHOUT CONTRAST TECHNIQUE: Multidetector CT imaging of the lumbar spine was performed without intravenous contrast administration. Multiplanar CT image reconstructions were also generated. COMPARISON:  CT lumbar spine 08/19/2017 FINDINGS: Segmentation: Normal Alignment: Normal Vertebrae:  Horizontal fracture through the L3 vertebral body similar to the prior study. The margins are sclerotic in this appears to be a subacute fracture without bony healing. Right L3 screw extends into the fracture and appears loose. No other fracture. Pedicle screw and posterior rod fusion in the lower thoracic spine extending into the sacrum and iliac bone bilaterally. The upper level of  the fusion is not identified. The posterior rod at T12-L1 on the right has been cut in the interval. The rod is now continuous from L1 through the sacrum. The left rod is discontinuous at the L1-2 level on the left and is continuous from L2 through the sacrum on the left. These represent interval changes since the prior study. Left lateral plate fusion U8-8 unchanged. Anterior plate and screw fusion L5-S1 unchanged. Paraspinal and other soft tissues: No retroperitoneal hematoma. Nose paraspinous mass. Disc levels: Bilateral laminectomy L2 through L5. Posterior bony fusion is again noted and unchanged appears solid. No significant stenosis of the spinal canal. Moderate foraminal narrowing bilaterally at L2-3. Mild foraminal narrowing bilaterally L5-S1 due to spurring. IMPRESSION: Interval revision of multilevel thoracolumbar fusion. Horizontal fracture of L3 is unchanged without bony healing. Right L3 screw extends into the fracture line unchanged. Electronically Signed   By: Franchot Gallo M.D.   On: 08/31/2017 14:50     Assessment/Plan: 77yo F with hx of revision to thoracolumbar fusion being treated for presumed culture negative HW infection with vancomycin plus rifampin having GI symptoms abdominal discomfort, nausea, chills  - some of these side effects are attributed to rifampin. Will stop the antibiotic to see if any improvement - will check cbc with diff, cmp (see if any transaminitis), and lipase - will follow up on abd ct to see if any abd to account for her physical presentation - also concern for atypical presentation of chest pain (as peri-operative MI)- will check troponins and ekg  - in terms of culture negative deep back HW infection = will continue with vancomycin plus ceftriaxone 2gm IV daily (which we will add)  - constipation = has been having bowel movements. Has had manual disimpaction x 2.   [facility] for Infectious Diseases Cell: [phone] Pager:  [phone]  08/31/2017, 3:08 PM

## 2017-08-31 NOTE — Progress Notes (Addendum)
EKG does not appear to have acute changes to suggest ACS & labs reveal hypokalemia   Will replete 56meq now

## 2017-08-31 NOTE — Progress Notes (Signed)
Physical Therapy Treatment Patient Details Name: Karen Castillo MRN: [id number] DOB: 12-Sep-1940 Today's Date: 08/31/2017    History of Present Illness Karen Castillo is a 77 y.o. female s/p revision fusion procedure admitted on 08/27/2017 with possible wound infection.  Recent L5-S1 fusion on 07/09/17.  HX GERD and Bipolar.     PT Comments    Continuing work on functional mobility and activity tolerance;  Improved participation and better gait distance compared to last session; still requires lots of encouragement; Agree with SFN for dc plan   Follow Up Recommendations  SNF;Supervision/Assistance - 24 hour     Equipment Recommendations  Rolling walker with 5" wheels;3in1 (PT)    Recommendations for Other Services       Precautions / Restrictions Precautions Precautions: Fall;Back Precaution Comments: Pt able to verbalize 3/3 back precautions Required Braces or Orthoses: Spinal Brace Spinal Brace: Applied in sitting position;Thoracolumbosacral orthotic    Mobility  Bed Mobility Overal bed mobility: Needs Assistance Bed Mobility: Rolling;Sidelying to Sit;Sit to Sidelying Rolling: Min assist Sidelying to sit: Min assist     Sit to sidelying: Mod assist General bed mobility comments: Min A for rolling and then pushing up into sitting. Mod A for BLEs management to return to supine  Transfers Overall transfer level: Needs assistance Equipment used: Rolling walker (2 wheeled) Transfers: Sit to/from Stand Sit to Stand: Min assist;From elevated surface         General transfer comment: Cues for hand placement and safety; min assist to steady pt and RW  Ambulation/Gait Ambulation/Gait assistance: Min guard Gait Distance (Feet): 50 Feet Assistive device: Rolling walker (2 wheeled) Gait Pattern/deviations: Step-through pattern;Decreased step length - right;Decreased step length - left;Decreased stride length Gait velocity: slowed   General Gait Details: Encouragement and  cues to self-monitor for activity tolerance given; chair pulled behind for safety; Reports walking brings on nausea   Stairs             Wheelchair Mobility    Modified Rankin (Stroke Patients Only)       Balance Overall balance assessment: Needs assistance;History of Falls Sitting-balance support: No upper extremity supported;Feet supported Sitting balance-Leahy Scale: Fair       Standing balance-Leahy Scale: Poor Standing balance comment: relies on UE support on RW                            Cognition Arousal/Alertness: Awake/alert Behavior During Therapy: [facility] for tasks assessed/performed;Anxious Overall Cognitive Status: Within Functional Limits for tasks assessed                                 General Comments: Axious about pain. Pt agreeable to therapy. Able to follow simple commands      Exercises      General Comments General comments (skin integrity, edema, etc.): VSS      Pertinent Vitals/Pain Pain Assessment: 0-10 Pain Score: 8  Faces Pain Scale: Hurts even more Pain Location: back, as well as abdominal pain Pain Descriptors / Indicators: Aching;Grimacing;Guarding Pain Intervention(s): Monitored during session;Repositioned    Home Living                      Prior Function            PT Goals (current goals can now be found in the care plan section) Acute Rehab PT Goals Patient Stated Goal:  to go home PT Goal Formulation: With patient Time For Goal Achievement: 09/12/17 Potential to Achieve Goals: Good Progress towards PT goals: Progressing toward goals    Frequency    Min 3X/week      PT Plan Current plan remains appropriate    Co-evaluation              AM-PAC PT "6 Clicks" Daily Activity  Outcome Measure  Difficulty turning over in bed (including adjusting bedclothes, sheets and blankets)?: A Lot Difficulty moving from lying on back to sitting on the side of the bed? :  Unable Difficulty sitting down on and standing up from a chair with arms (e.g., wheelchair, bedside commode, etc,.)?: A Little Help needed moving to and from a bed to chair (including a wheelchair)?: A Little Help needed walking in hospital room?: A Little Help needed climbing 3-5 steps with a railing? : A Lot 6 Click Score: 14    End of Session Equipment Utilized During Treatment: Back brace Activity Tolerance: Patient limited by fatigue;Patient limited by pain Patient left: in bed;with call bell/phone within reach;with nursing/sitter in room Nurse Communication: Mobility status PT Visit Diagnosis: Unsteadiness on feet (R26.81);Muscle weakness (generalized) (M62.81);Pain Pain - part of body: (back)     Time: 3300-7622 PT Time Calculation (min) (ACUTE ONLY): 25 min  Charges:  $Gait Training: 23-37 mins                    G Codes:        Roney Marion, PT  Acute Rehabilitation Services Pager [phone] Office [place] 08/31/2017, 4:22 PM

## 2017-08-31 NOTE — Progress Notes (Signed)
    Patient 4 Days S/P revision L3-4 fusion. Has been up with slow progress in PT/OT over weekend. She received 2U PRBC's and did feel much improved after this. She has been struggling some with firm BM's and C/O constipation. She has been having BM's approx 2x day with assistance from supp and nursing staff. She C/O abdominal pain that "feels like bad constipation." She also C/O R leg pain today. Per previous documentation she had denied leg pain until today however today she states she has had this since she woke up from surgery. Reports right lateral thigh pain hip to knee. Nursing staff states pt rests comfortably the majority of the time and does not want to mobilize but does OK with ambulation and PT/OT when she participates. She is requiring SNF but does not want to return to [facility] where she was. She is requesting [facility] and social work is working on this and still awaiting final approval/arrangements. ID to see pt today and alter recs as needed.   Physical Exam: Vitals:   08/31/17 0420 08/31/17 0749  BP: (!) 142/46 108/88  Pulse: 73 73  Resp: 12 11  Temp: 98.7 F (37.1 C) 98.1 F (36.7 C)  SpO2: 97% 96%   CBC Latest Ref Rng & Units 08/30/2017 08/29/2017 08/28/2017  WBC 4.0 - 10.5 K/uL 8.6 14.2(H) 13.1(H)  Hemoglobin 12.0 - 15.0 g/dL 10.7(L) 8.0(L) 9.7(L)  Hematocrit 36.0 - 46.0 % 32.4(L) 25.1(L) 29.9(L)  Platelets 150 - 400 K/uL 235 295 314    Dressing in place, CDI, TLSO at bedside, pt resting comfortably in bed. She has TTP about the right lateral thigh. Mild TTP in all 4 quadrants abdomen with some mild distension and bloat.  NVI  POD #4 s/p revision L3-4 fusion  - up with PT/OT, encourage ambulation - Dilaudid for pain, Valium for muscle spasms  -Scripts signed and in chart for D/C - likely d/c SNF ([facility]) today vs tomorrow pending CT results and clearance   -D/C instructions printed and in chart  - R lateral thigh pain likely secondary to IT band tension and sedentary  lifestyle of late - Abdominal pain likely secondary to constipation, CT scan pending results to confirm   - Potential wound infection followed by ID, we appreciate their input  - treating 4 wk as deep tissue infection, may extend further  - vancomycin, with goal of 15-20  - rifampin 300mg  po bid (with meals)  - may need prn anti-emetic due to rifampin Nausea profile  - picc line placed   - follow up in the ID clinic in 3-4 wk   - For D/C will need 2x week bmp, weekly vanco trough with goal of 15-20. Also patient will need weekly sed rate, crp, and cbc.

## 2017-08-31 NOTE — Social Work (Addendum)
CSW able to secure placement for tomorrow for pt to discharge to [facility], pt PASSR is still pending however, paper faxed to [place] for signature on 30 day note. Await fax back or return of 30 day note to discharge to [facility].   3:43pm- CSW spoke with pt daughter [name], informed her about [facility] [place] acceptance, Pt has been authorized by [place] [phone] 7 days initially approved. Will request updates from SNF for next authorization  Has used 39 of 100 days will have $160 copay/day  Alexander Mt, [place] Work [phone]

## 2017-08-31 NOTE — Care Management Important Message (Signed)
Important Message  Patient Details  Name: Karen Castillo MRN: [id number] Date of Birth: November 17, 1940   Medicare Important Message Given:  Yes    Sahand Gosch [address] 08/31/2017, 3:50 PM

## 2017-09-01 ENCOUNTER — Other Ambulatory Visit: Payer: Self-pay

## 2017-09-01 DIAGNOSIS — T8463XA Infection and inflammatory reaction due to internal fixation device of spine, initial encounter: Secondary | ICD-10-CM

## 2017-09-01 DIAGNOSIS — E876 Hypokalemia: Secondary | ICD-10-CM

## 2017-09-01 DIAGNOSIS — R1030 Lower abdominal pain, unspecified: Secondary | ICD-10-CM

## 2017-09-01 LAB — BASIC METABOLIC PANEL
Anion gap: 15 (ref 5–15)
CO2: 24 mmol/L (ref 22–32)
CREATININE: 1.07 mg/dL — AB (ref 0.44–1.00)
Calcium: 9.2 mg/dL (ref 8.9–10.3)
Chloride: 91 mmol/L — ABNORMAL LOW (ref 98–111)
GFR calc Af Amer: 57 mL/min — ABNORMAL LOW (ref >60)
GFR, EST NON AFRICAN AMERICAN: 49 mL/min — AB (ref >60)
Glucose, Bld: 136 mg/dL — ABNORMAL HIGH (ref 70–99)
Potassium: 3.3 mmol/L — ABNORMAL LOW (ref 3.5–5.1)
Sodium: 130 mmol/L — ABNORMAL LOW (ref 135–145)

## 2017-09-01 LAB — AEROBIC/ANAEROBIC CULTURE W GRAM STAIN (SURGICAL/DEEP WOUND)

## 2017-09-01 LAB — AEROBIC/ANAEROBIC CULTURE (SURGICAL/DEEP WOUND)
CULTURE: NO GROWTH
GRAM STAIN: NONE SEEN

## 2017-09-01 MED ORDER — DIAZEPAM 5 MG PO TABS
5.0000 mg | ORAL_TABLET | Freq: Four times a day (QID) | ORAL | Status: DC | PRN
  Administered 2017-09-01 – 2017-09-04 (×9): 5 mg via ORAL
  Filled 2017-09-01 (×8): qty 1

## 2017-09-01 MED ORDER — VANCOMYCIN HCL IN DEXTROSE 750-5 MG/150ML-% IV SOLN: 750.0000 mg | Freq: Two times a day (BID) | INTRAVENOUS | 0 refills | Status: DC

## 2017-09-01 MED ORDER — SODIUM CHLORIDE 0.9 % IV SOLN: 2.0000 g | INTRAVENOUS | 0 refills | Status: DC

## 2017-09-01 MED ORDER — FLEET ENEMA 7-19 GM/118ML RE ENEM: 1.0000 | ENEMA | Freq: Every day | RECTAL | Status: DC | PRN

## 2017-09-01 MED ORDER — POTASSIUM CHLORIDE CRYS ER 20 MEQ PO TBCR
40.0000 meq | EXTENDED_RELEASE_TABLET | Freq: Once | ORAL | Status: AC
  Administered 2017-09-01: 40 meq via ORAL
  Filled 2017-09-01: qty 2

## 2017-09-01 MED ORDER — ONDANSETRON HCL 4 MG/2ML IJ SOLN: 4.0000 mg | Freq: Four times a day (QID) | INTRAMUSCULAR | 0 refills | Status: DC | PRN

## 2017-09-01 MED ORDER — METHOCARBAMOL 500 MG PO TABS: 500.0000 mg | ORAL_TABLET | Freq: Four times a day (QID) | ORAL | 1 refills | Status: DC | PRN

## 2017-09-01 MED ORDER — METHOCARBAMOL 500 MG PO TABS
500.0000 mg | ORAL_TABLET | Freq: Four times a day (QID) | ORAL | Status: DC | PRN
  Administered 2017-09-01 – 2017-09-03 (×9): 500 mg via ORAL
  Filled 2017-09-01 (×9): qty 1

## 2017-09-01 MED ORDER — OXYCODONE HCL ER 10 MG PO T12A: 10.0000 mg | EXTENDED_RELEASE_TABLET | Freq: Two times a day (BID) | ORAL | Status: DC

## 2017-09-01 MED FILL — Sodium Chloride IV Soln 0.9%: INTRAVENOUS | Qty: 1000 | Status: AC

## 2017-09-01 MED FILL — Heparin Sodium (Porcine) Inj 1000 Unit/ML: INTRAMUSCULAR | Qty: 30 | Status: AC

## 2017-09-01 NOTE — Progress Notes (Signed)
OT Cancellation Note  Patient Details Name: Karen Castillo MRN: [id number] DOB: 10-26-1940   Cancelled Treatment:    Reason Eval/Treat Not Completed: Patient declined, no reason specified(too much discomfort and pending d/c ) Pt reports "scared to death of falling and has tightness in her back and that today is not a good day."  [name]   OTR/L Pager: [phone] Office: [phone] .  09/01/2017, 12:44 PM

## 2017-09-01 NOTE — Consult Note (Signed)
   [facility] CM Inpatient Consult   09/01/2017  DALLY OSHEL 1940/12/10 [id number]  Patient screened for multiple hospitalizations in the past 6 months [place] [facility] in the [employer] plan. Chart review from PT notes:  Karen Castillo is a 77 y.o. female s/p revision fusion procedure admitted on 08/27/2017 with possible wound infection.  Recent L5-S1 fusion on 07/09/17.  Met with the patient at the bedside.  Patient sitting up in chair states she's, "not doing so great."  Spoke with patient regarding [place] [facility] services for transitioning back home. Patient states she is hopeful to go to [facility] in [place] today.  She states that her primary care provider is Dr. Nelda Bucks.  Noted that this office provides the transition of care follow up calls.  Patient states she would like information on [facility] but doesn't know the outcome of returning home from skilled.  Expressed that a [facility] CM social worker could follow for transitional needs when returning from SNF.  Patient states, "that'll be alright." Will have [facility] social worker follow up at disposition destination.  Chart review reveals awaiting paper work.   For questions contact:   Natividad Brood, RN BSN [facility] Liaison  [phone] business mobile phone Toll free office [phone]

## 2017-09-01 NOTE — Progress Notes (Signed)
[facility] for Infectious Disease    Date of Admission:  08/27/2017   Total days of antibiotics 6           ID: Karen Castillo is a 77 y.o. 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 Some improvement with abdominal pain. Feels less nausea. Has been going to the bathroom to help with constipation  Medications:  . allopurinol  100 mg Oral Daily  . amLODipine  10 mg Oral Daily  . atenolol  100 mg Oral QHS  . calcium-vitamin D  1 tablet Oral Q breakfast  . Chlorhexidine Gluconate Cloth  6 each Topical Daily  . cholecalciferol  2,000 Units Oral Daily  . cloNIDine  0.1 mg Oral QHS  . losartan  100 mg Oral Daily   And  . hydrochlorothiazide  25 mg Oral Daily  . latanoprost  1 drop Both Eyes QHS  . Melatonin  9 mg Oral QHS  . nortriptyline  10 mg Oral QHS  . pantoprazole  40 mg Oral Daily  . polyethylene glycol  17 g Oral Daily  . potassium chloride  40 mEq Oral Once  . psyllium  1 packet Oral Daily  . senna-docusate  2 tablet Oral BID  . sodium chloride flush  10-40 mL Intracatheter Q12H  . spironolactone  25 mg Oral Daily  . timolol  1 drop Both Eyes Daily  . traZODone  200 mg Oral QHS    Objective: Vital signs in last 24 hours: Temp:  [97.9 F (36.6 C)-99.3 F (37.4 C)] 97.9 F (36.6 C) (06/25 1232) Pulse Rate:  [72-77] 72 (06/25 1232) Resp:  [10-20] 20 (06/25 1232) BP: (143-177)/(53-64) 144/62 (06/25 1232) SpO2:  [96 %-100 %] 96 % (06/25 1232) Physical Exam  Constitutional:  oriented to person, place, and time. appears well-developed and well-nourished. No distress.  HENT: [place]/AT, PERRLA, no scleral icterus Mouth/Throat: Oropharynx is clear and moist. No oropharyngeal exudate.  Cardiovascular: Normal rate, regular rhythm and normal heart sounds. Exam reveals no gallop and no friction rub.  No murmur heard.  Pulmonary/Chest: Effort normal and breath sounds  normal. No respiratory distress.  has no wheezes.  Abdominal: Soft. Bowel sounds are decreased. exhibits no distension. There is no tenderness.  Neurological: alert and oriented to person, place, and time.  Skin: Skin is warm and dry. No rash noted. No erythema.  Psychiatric: a normal mood and affect.  behavior is normal.   Lab Results Recent Labs    08/30/17 0756 08/31/17 1620 09/01/17 1151  WBC 8.6 9.6  --   HGB 10.7* 11.1*  --   HCT 32.4* 32.8*  --   NA  --  128* 130*  K  --  2.9* 3.3*  CL  --  90* 91*  CO2  --  27 24  BUN  --  5* <5*  CREATININE  --  0.92 1.07*   Liver Panel Recent Labs    08/31/17 1620  PROT 5.8*  ALBUMIN 2.7*  AST 19  ALT 14  ALKPHOS 95  BILITOT 1.2   Lab Results  Component Value Date   ESRSEDRATE 55 (H) 08/28/2017    Microbiology: cx  Studies/Results: Ct Abdomen Pelvis Wo Contrast  Result Date: 08/31/2017 CLINICAL DATA:  Abdominal pain.  Nausea and vomiting EXAM: CT ABDOMEN AND PELVIS WITHOUT CONTRAST TECHNIQUE: Multidetector CT imaging of the abdomen and pelvis  was performed following the standard protocol without IV contrast. COMPARISON:  None. FINDINGS: Lower chest: Coronary atherosclerotic calcification. Mild dependent atelectasis. 34 x 20 mm right cardiophrenic sulcus cyst. Hepatobiliary: No focal liver abnormality.Cholecystectomy with normal common bile duct diameter. Pancreas: Unremarkable. Spleen: Unremarkable. Adrenals/Urinary Tract: Negative adrenals. No hydronephrosis or stone. Mild generalized prominence of bladder wall thickness without convincing perivesicular fat stranding. Stomach/Bowel: No obstruction. No appendicitis. Moderate stool volume Vascular/Lymphatic: No acute vascular abnormality. No mass or adenopathy. Reproductive:Unremarkable for age. Other: No ascites or pneumoperitoneum. Musculoskeletal: Spinal findings reported on dedicated exam. IMPRESSION: 1. Borderline bladder wall thickening and perivesicular stranding, please  correlate for cystitis symptoms. 2. Moderate stool volume without obstruction or rectal impaction. 3.  Aortic Atherosclerosis (ICD10-I70.0).  Coronary atherosclerosis. Electronically Signed   By: Monte Fantasia M.D.   On: 08/31/2017 14:59   Ct Thoracic Spine Wo Contrast  Result Date: 08/31/2017 CLINICAL DATA:  Abdominal pain. Right lateral thigh pain from hip to knee. EXAM: CT THORACIC AND LUMBAR SPINE WITHOUT CONTRAST TECHNIQUE: Multidetector CT imaging of the thoracic and lumbar spine was performed without contrast. Multiplanar CT image reconstructions were also generated. COMPARISON:  Preoperative lumbar spine CT 08/19/2017 FINDINGS: CT THORACIC SPINE FINDINGS Alignment: Exaggerated thoracic kyphosis without listhesis. Vertebrae: T10 to L1/2 posterior fusion as described below. Lucency around bilateral T10 pedicle screws where there is no demonstrable solid posterior arthrodesis. 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 No acute fracture Paraspinal and other soft tissues: No acute finding Disc levels: Spondylosis in the mid and lower thoracic spine. Postoperative findings noted above. Lower thoracic facet spurring. No visible cord impingement. CT LUMBAR SPINE FINDINGS Segmentation: 5 lumbar type vertebral bodies Alignment: Physiologic Vertebrae: Posterior fusion on the right from L1 to the ilium and on the left from L2 to the ilium. Iliac screws are new. There is ALIF plate at G3-T5 and XLIF plate at V7-6. the posterior rods have been revised to span a L3 fracture extending obliquely across the vertebral body and into the right posterior elements. Remote L2 superior endplate fracture. Paraspinal and other soft tissues: Soft tissue gas within the laminectomy defect of L2-L4, correlating with recent surgery. There is superimposed incisional fluid without demonstrable mass effect on the thecal sac. Retroperitoneum is  reported separately on dedicated abdominal CT Disc levels: T12- L1: Posterior-lateral solid arthrodesis. No visible impingement L1-L2: Posterior-lateral solid arthrodesis.  No visible impingement L2-L3: Discectomy and posterolateral fusion. Residual spurring chronically narrows the foramina. Laminectomy. Hardware obscures the canal. L3-L4: Solid fusion.  No visible impingement. L4-L5: Solid arthrodesis.  No visible impingement. L5-S1:Prior pseudoarthrosis with revision. The discectomy graft shows incorporation. Vacuum phenomenon has resolved within the disc space. No progressive lucency around the S1 screws. IMPRESSION: 1. Thoracic and lumbar spine fusion with recent revision to span L3 body and posterior element fractures. No unexpected finding. 2. Recent revised L5-S1 level with incorporating bone graft. 3. Bilateral lucency around T10 pedicle screws. No demonstrable T10-11 posterior-lateral arthrodesis. Electronically Signed   By: Monte Fantasia M.D.   On: 08/31/2017 14:54   Ct L-spine No Charge  Result Date: 08/31/2017 CLINICAL DATA:  Postop pain. Vertebral body fracture. Lumbar fusion revision 08/27/2017 EXAM: CT LUMBAR SPINE WITHOUT CONTRAST TECHNIQUE: Multidetector CT imaging of the lumbar spine was performed without intravenous contrast administration. Multiplanar CT image reconstructions were also generated. COMPARISON:  CT lumbar spine 08/19/2017 FINDINGS: Segmentation: Normal Alignment: Normal Vertebrae: Horizontal fracture through the L3 vertebral  body similar to the prior study. The margins are sclerotic in this appears to be a subacute fracture without bony healing. Right L3 screw extends into the fracture and appears loose. No other fracture. Pedicle screw and posterior rod fusion in the lower thoracic spine extending into the sacrum and iliac bone bilaterally. The upper level of the fusion is not identified. The posterior rod at T12-L1 on the right has been cut in the interval. The rod is now  continuous from L1 through the sacrum. The left rod is discontinuous at the L1-2 level on the left and is continuous from L2 through the sacrum on the left. These represent interval changes since the prior study. Left lateral plate fusion D4-7 unchanged. Anterior plate and screw fusion L5-S1 unchanged. Paraspinal and other soft tissues: No retroperitoneal hematoma. Nose paraspinous mass. Disc levels: Bilateral laminectomy L2 through L5. Posterior bony fusion is again noted and unchanged appears solid. No significant stenosis of the spinal canal. Moderate foraminal narrowing bilaterally at L2-3. Mild foraminal narrowing bilaterally L5-S1 due to spurring. IMPRESSION: Interval revision of multilevel thoracolumbar fusion. Horizontal fracture of L3 is unchanged without bony healing. Right L3 screw extends into the fracture line unchanged. Electronically Signed   By: Franchot Gallo M.D.   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 Will decide to extend at follow up appt if needed.   Abdominal pain/n = thought to be due to rifampin  Which has been discontinued  Hypokalemia = possibly from GI loss. Will replete with 63meq x 1  Will see back in the ID clinic in 3 wk  [facility] for Infectious Diseases Cell: [phone] Pager: [phone]  09/01/2017, 4:06 PM

## 2017-09-01 NOTE — Progress Notes (Addendum)
    Patient demonstrating more alertness today and improved with PT/OT though still needing a lot of encouragement. CT scan came back negative for any obstruction/ileus/prominent abdominal pathology. Pt is having small BM's but continues to report a lot of straining and hard stools. She also describes some anxiousness and states her pain is not well controlled on PO meds however pt was resting comfortably upon my arrival to the room. ID has d/c'd refampin in hopes of improving abdominal discomfort and will continue to follow their recs. She has been approved for [facility].   Physical Exam: Vitals:   09/01/17 0300 09/01/17 0714  BP: (!) 143/64   Pulse: 73   Resp: 16   Temp: 99.3 F (37.4 C) 98.6 F (37 C)  SpO2: 100%     Dressing in place, CD, some tape peeling off and needs reinforced. TLSO at bedside. Pt resting comfortably in bed and able to roll over on her own. Pt was asleep upon entering the room and aroused easily. A&O X3.  NVI  POD #5 s/p revision lumbar fusion with potential underlying wound infection.   - up with PT/OT, encourage ambulation, good progress yesterday  - Dilaudid for pain, Valium for muscle spasms             -Scripts signed and in chart for D/C  -Pt requesting additional px meds. Unable to provide oxycontin for long acting secondary to hx of reaction to percocet per pharmacy. Will add robaxin in between valium. We discussed that she is already on rather strong medication and already suffering from constipation and increasing her px meds is not an excellent option at this time as she is progressing well otherwise  - d/c SNF ([facility]) today               -D/C instructions printed and in chart  - R lateral thigh pain likely secondary to IT band tension and sedentary lifestyle of late, tender to palpation, CT of thoracic and lumbar spine negative  - Abdominal pain secondary to constipation and potentially rifampin. CT negative for any other ominous pathology. ID has D/C'd  rifampin  - Potential wound infection followed by ID, we appreciate their input             - treating 4 wk as deep tissue infection,may extend further             - vancomycin, with goal of 15-20             - rifampin discontinued secondary to abdominal px  - picc line placed              - follow up in the ID clinic in3-4 wk              - For D/C will need 2x week bmp, weekly vanco trough with goal of 15-20. Also patient will need weekly sed rate, crp, and cbc

## 2017-09-01 NOTE — Progress Notes (Addendum)
Pharmacy Antibiotic Note  Karen Castillo is a 77 y.o. 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  Pharmacy has been consulted for Vancomycin dosing. Also started on ceftriaxone per ID. Afebrile, WBC down to wnl. SCr up a bit to 1.07. Last vancomycin trough therapeutic on 6/22. Lumbar wound culture negative. Per ID recommendations, to treat x 4 weeks through 7/17.  Plan: Vancomycin 750 mg IV every 12 hours Ceftriaxone 2g IV q24h per MD Monitor clinical progress, c/s, renal function, vancomycin trough at least weekly Treating x 4wks - OPAT completed  Height: 5\' 4"  (162.6 cm) Weight: 225 lb (102.1 kg) IBW/kg (Calculated) : 54.7  Temp (24hrs), Avg:98.6 F (37 C), Min:98.4 F (36.9 C), Max:99.3 F (37.4 C)  Recent Labs  Lab 08/25/17 1559 08/28/17 0840 08/28/17 1135 08/29/17 0100 08/30/17 0756 08/31/17 1620 09/01/17 1151  WBC 7.6 13.1*  --  14.2* 8.6 9.6  --   CREATININE 0.77  --  0.85 0.82  --  0.92 1.07*  VANCOTROUGH  --   --   --  15  --   --   --     Estimated Creatinine Clearance: 52.9 mL/min (A) (by C-G formula based on SCr of 1.07 mg/dL (H)).    Allergies  Allergen Reactions  . Ciprofloxacin Swelling    Tongue swells  . Cyclobenzaprine Swelling    Feet swell   . Pregabalin Anaphylaxis, Shortness Of Breath, Swelling and Other (See Comments)    Dizziness also  . Fluvastatin Other (See Comments)    Leg pain   . Meloxicam Swelling    Feet became swollen  . Metaxalone Rash and Other (See Comments)    Flushing of the skin and sore throat, too  . Pramipexole Nausea And Vomiting  . Pravastatin Other (See Comments)    Leg pain  . Rosuvastatin Other (See Comments)    Leg pain  . Sertraline Other (See Comments)    Sensitive teeth  . Statins Other (See Comments)    Leg pain     . Amitriptyline Other (See Comments)    Weird feeling all over  . Arthrotec  [Diclofenac-Misoprostol] Diarrhea  . Augmentin [Amoxicillin-Pot Clavulanate] Diarrhea    Has patient had a PCN reaction causing immediate rash, facial/tongue/throat swelling, SOB or lightheadedness with hypotension: No Has patient had a PCN reaction causing severe rash involving mucus membranes or skin necrosis: No Has patient had a PCN reaction that required hospitalization: No Has patient had a PCN reaction occurring within the last 10 years: No If all of the above answers are "NO", then may proceed with Cephalosporin use.   . Bromfenac Diarrhea and Nausea Only  . Cephalexin Diarrhea and Nausea Only  . Conj Estrog-Medroxyprogest Ace Other (See Comments)    Unsure of reaction type Couldn't tolerate  . Diclofenac-Misoprostol Nausea Only and Diarrhea  . Erythromycin Diarrhea and Nausea Only  . Esomeprazole Diarrhea  . Esomeprazole Magnesium Nausea Only  . Levofloxacin Other (See Comments) and Nausea Only    Stomach upset  . Methadone Diarrhea  . Metoclopramide Diarrhea and Nausea Only  . Oxycodone Hives, Swelling and Rash  . Propoxyphene Diarrhea and Nausea Only  . Rofecoxib Diarrhea and Nausea Only  . Sulfur Diarrhea and Nausea Only  . Tramadol Diarrhea  . Zoloft [Sertraline Hcl] Other (See Comments)    Sensitivity with teeth   Elicia Lamp, PharmD, BCPS Clinical Pharmacist Clinical phone for 09/01/2017 until 3:30pm: [phone] If  after 3:30pm, please call main pharmacy at: [phone] 09/01/2017 1:10 PM

## 2017-09-01 NOTE — Social Work (Addendum)
Clinical Social Worker facilitated patient discharge including contacting patient family and facility to confirm patient discharge plans.  Clinical information faxed to facility and family agreeable with plan.  CSW arranged ambulance transport via [facility] to [place] RN to call [phone] ext [phone] with report  prior to discharge.  Clinical Social Worker will sign off for now as social work intervention is no longer needed. Please consult [place] again if new need arises.  Alexander Mt, [place] Social Worker [phone]

## 2017-09-01 NOTE — Discharge Summary (Addendum)
Patient ID: KRYSTALE RINKENBERGER MRN: [id number] DOB/AGE: 10-12-1940 77 y.o.  Admit date: 08/27/2017 Discharge date: 09/04/2017  Admission Diagnoses:  Active Problems:   Lower back pain   Postoperative anemia due to acute blood loss   Discharge Diagnoses:  Same  Past Medical History:  Diagnosis Date  . Allergic rhinitis   . Anemia   . Arthritis   . Bipolar disorder ([place])    patient denies  . Cancer (HCC)    SKIN    ,    LEFT BREAST   . Chronic constipation   . Chronic pain syndrome   . Dry eyes   . Dysuria   . GERD (gastroesophageal reflux disease)   . Gout   . Hypertension   . Insomnia   . Schizophrenia ([place])   . Sleep apnea    USES CPAP   . Urticaria   . Vitamin deficiency     Surgeries: Procedure(s): LUMBAR 3 -4  LUMBAR FUSION REVISION WITH INSTRUMENTATION AND ALLOGRAFT.  TIME REQUESTED 4 HOURS on 08/27/2017   Consultants: ID, Dr. Carlyle Basques  Discharged Condition: Improved  Hospital Course: MICHEL ESKELSON is an 77 y.o. female who was admitted 08/27/2017 for operative treatment of L3 fracture. Patient has severe unremitting pain that affects sleep, daily activities, and work/hobbies. After pre-op clearance the patient was taken to the operating room on 08/27/2017 and underwent  Procedure(s): LUMBAR 3 -4  LUMBAR FUSION REVISION WITH INSTRUMENTATION AND ALLOGRAFT.  TIME REQUESTED 4 HOURS.    Patient was given perioperative antibiotics:  Anti-infectives (From admission, onward)   Start     Dose/Rate Route Frequency Ordered Stop   09/01/17 0000  Vancomycin (VANCOCIN) 750-5 MG/150ML-% SOLN     750 mg 150 mL/hr over 60 Minutes Intravenous Every 12 hours 09/01/17 0844     09/01/17 0000  cefTRIAXone 2 g in sodium chloride 0.9 % 100 mL     2 g 200 mL/hr over 30 Minutes Intravenous Every 24 hours 09/01/17 0844     08/31/17 1000  cefTRIAXone (ROCEPHIN) 1 g in sodium chloride 0.9 % 100 mL IVPB  Status:  Discontinued     1 g 200 mL/hr over 30 Minutes Intravenous Every  12 hours 08/31/17 0929 08/31/17 0940   08/31/17 1000  cefTRIAXone (ROCEPHIN) 2 g in sodium chloride 0.9 % 100 mL IVPB     2 g 200 mL/hr over 30 Minutes Intravenous Every 24 hours 08/31/17 0940     08/28/17 1330  rifampin (RIFADIN) capsule 300 mg  Status:  Discontinued     300 mg Oral 2 times daily with meals 08/28/17 1234 08/31/17 1508   08/28/17 0200  vancomycin (VANCOCIN) IVPB 750 mg/150 ml premix     750 mg 150 mL/hr over 60 Minutes Intravenous Every 12 hours 08/27/17 2127     08/28/17 0000  vancomycin IVPB     750 mg Intravenous Every 12 hours 08/28/17 1437 09/23/17 2359   08/27/17 1754  vancomycin (VANCOCIN) powder  Status:  Discontinued       As needed 08/27/17 1754 08/27/17 1912   08/27/17 1747  polymyxin B 500,000 Units, bacitracin 50,000 Units in sodium chloride 0.9 % 500 mL irrigation  Status:  Discontinued       As needed 08/27/17 1747 08/27/17 1912   08/27/17 1045  vancomycin (VANCOCIN) 1,500 mg in sodium chloride 0.9 % 500 mL IVPB     1,500 mg 250 mL/hr over 120 Minutes Intravenous On call to O.R. 08/26/17 1344 08/27/17  1539       Patient was given sequential compression devices, early ambulation to prevent DVT.  Patient benefited maximally from hospital stay and there were no complications.    Recent vital signs:  Patient Vitals for the past 24 hrs:  BP Temp Temp src Pulse Resp SpO2  09/01/17 1232 (!) 144/62 97.9 F (36.6 C) Oral 72 20 96 %  09/01/17 0852 (!) 161/63 98.6 F (37 C) Oral 75 18 99 %  09/01/17 0714 - 98.6 F (37 C) - - - -  09/01/17 0300 (!) 143/64 99.3 F (37.4 C) Oral 73 16 100 %  08/31/17 2300 (!) 167/53 98.5 F (36.9 C) Oral 77 17 100 %  08/31/17 2251 (!) 177/58 - - - - -  08/31/17 1939 (!) 157/58 98.4 F (36.9 C) Oral 73 10 100 %  08/31/17 1645 (!) 158/60 - - 74 18 100 %  08/31/17 1559 (!) 182/56 98.4 F (36.9 C) Oral 74 15 99 %     Discharge Medications:   Allergies as of 09/01/2017      Reactions   Ciprofloxacin Swelling   Tongue  swells   Cyclobenzaprine Swelling   Feet swell   Pregabalin Anaphylaxis, Shortness Of Breath, Swelling, Other (See Comments)   Dizziness also   Fluvastatin Other (See Comments)   Leg pain   Meloxicam Swelling   Feet became swollen   Metaxalone Rash, Other (See Comments)   Flushing of the skin and sore throat, too   Pramipexole Nausea And Vomiting   Pravastatin Other (See Comments)   Leg pain   Rosuvastatin Other (See Comments)   Leg pain   Sertraline Other (See Comments)   Sensitive teeth   Statins Other (See Comments)   Leg pain    Amitriptyline Other (See Comments)   Weird feeling all over   Arthrotec [diclofenac-misoprostol] Diarrhea   Augmentin [amoxicillin-pot Clavulanate] Diarrhea   Has patient had a PCN reaction causing immediate rash, facial/tongue/throat swelling, SOB or lightheadedness with hypotension: No Has patient had a PCN reaction causing severe rash involving mucus membranes or skin necrosis: No Has patient had a PCN reaction that required hospitalization: No Has patient had a PCN reaction occurring within the last 10 years: No If all of the above answers are "NO", then may proceed with Cephalosporin use.   Bromfenac Diarrhea, Nausea Only   Cephalexin Diarrhea, Nausea Only   Conj Estrog-medroxyprogest Ace Other (See Comments)   Unsure of reaction type Couldn't tolerate   Diclofenac-misoprostol Nausea Only, Diarrhea   Erythromycin Diarrhea, Nausea Only   Esomeprazole Diarrhea   Esomeprazole Magnesium Nausea Only   Levofloxacin Other (See Comments), Nausea Only   Stomach upset   Methadone Diarrhea   Metoclopramide Diarrhea, Nausea Only   Oxycodone Hives, Swelling, Rash   Propoxyphene Diarrhea, Nausea Only   Rofecoxib Diarrhea, Nausea Only   Sulfur Diarrhea, Nausea Only   Tramadol Diarrhea   Zoloft [sertraline Hcl] Other (See Comments)   Sensitivity with teeth      Medication List    TAKE these medications   allopurinol 100 MG tablet Commonly known  as:  ZYLOPRIM Take 100 mg by mouth daily.   amLODipine 10 MG tablet Commonly known as:  NORVASC Take 10 mg by mouth daily.   aspirin EC 81 MG tablet Take 81 mg by mouth daily.   atenolol 100 MG tablet Commonly known as:  TENORMIN Take 100 mg by mouth at bedtime.   azelastine 0.1 % nasal spray Commonly known as:  ASTELIN Place 1-2 sprays into both nostrils 2 (two) times daily as needed for rhinitis or allergies.   BENEFIBER Powd Take 1 packet by mouth daily. Clear and Sugar FREE   CALCIUM+D3 PO Take 1 tablet by mouth daily.   cefTRIAXone 2 g in sodium chloride 0.9 % 100 mL Inject 2 g into the vein daily.   cloNIDine 0.1 MG tablet Commonly known as:  CATAPRES Take 0.1 mg by mouth at bedtime.   cycloSPORINE 0.05 % ophthalmic emulsion Commonly known as:  RESTASIS Place 1 drop into both eyes 4 (four) times daily.   diazepam 5 MG tablet Commonly known as:  VALIUM Take 1 tablet (5 mg total) by mouth every 6 (six) hours as needed for muscle spasms (for spasms).   lansoprazole 30 MG capsule Commonly known as:  PREVACID Take 30 mg by mouth daily before breakfast. 30 minutes before breakfast   latanoprost 0.005 % ophthalmic solution Commonly known as:  XALATAN Place 1 drop into both eyes at bedtime.   losartan-hydrochlorothiazide 100-25 MG tablet Commonly known as:  HYZAAR Take 1 tablet by mouth daily.   Melatonin 10 MG Tabs Take 10 mg by mouth at bedtime. (2100)   methocarbamol 500 MG tablet Commonly known as:  ROBAXIN Take 1 tablet (500 mg total) by mouth every 6 (six) hours as needed for muscle spasms (in between valium).   nortriptyline 10 MG capsule Commonly known as:  PAMELOR Take 10 mg by mouth at bedtime.   ondansetron 4 MG/2ML Soln injection Commonly known as:  ZOFRAN Inject 2 mLs (4 mg total) into the vein every 6 (six) hours as needed for nausea or vomiting.   polyethylene glycol packet Commonly known as:  MIRALAX / GLYCOLAX Take 17 g by mouth  daily.   promethazine 12.5 MG tablet Commonly known as:  PHENERGAN Take 12.5 mg by mouth 2 (two) times daily as needed for nausea or vomiting.   senna-docusate 8.6-50 MG tablet Commonly known as:  Senokot-S Take 2 tablets by mouth 2 (two) times daily.   spironolactone 25 MG tablet Commonly known as:  ALDACTONE Take 25 mg by mouth daily.   timolol 0.5 % ophthalmic solution Commonly known as:  BETIMOL Place 1 drop into both eyes daily.   traZODone 100 MG tablet Commonly known as:  DESYREL Take 200 mg by mouth at bedtime.   Vancomycin 750-5 MG/150ML-% Soln Commonly known as:  VANCOCIN Inject 150 mLs (750 mg total) into the vein every 12 (twelve) hours.   vancomycin IVPB Inject 750 mg into the vein every 12 (twelve) hours for 26 days. Indication:  osteomyelitis Last Day of Therapy:  09/23/2017 Labs - Sunday/Monday:  CBC/D, BMP, and vancomycin trough. Labs - Thursday:  BMP and vancomycin trough Labs - Every week:  ESR and CRP   Vitamin D3 2000 units Tabs Take 2,000 Units by mouth daily.            Home Infusion Instuctions  (From admission, onward)        Start     Ordered   08/28/17 0000  Home infusion instructions [facility] May follow [place] Dosing Protocol; May administer Cathflo as needed to maintain patency of vascular access device.; Flushing of vascular access device: per [facility] Protocol: 0.9% NaCl pre/post medica...    Question Answer Comment  Instructions May follow [place] Dosing Protocol   Instructions May administer Cathflo as needed to maintain patency of vascular access device.   Instructions Flushing of vascular access device:  per [facility] Protocol: 0.9% NaCl pre/post medication administration and prn patency; Heparin 100 u/ml, 64m for implanted ports and Heparin 10u/ml, 588mfor all other central venous catheters.   Instructions May follow [facility] Anaphylaxis Protocol for First Dose Administration in the home: 0.9% NaCl at 25-50 ml/hr to maintain  IV access for protocol meds. Epinephrine 0.3 ml IV/IM PRN and Benadryl 25-50 IV/IM PRN s/s of anaphylaxis.   Instructions [facility] Infusion Coordinator (RN) to assist per patient IV care needs in the home PRN.      08/28/17 1437      Diagnostic Studies: Ct Abdomen Pelvis Wo Contrast  Result Date: 08/31/2017 CLINICAL DATA:  Abdominal pain.  Nausea and vomiting EXAM: CT ABDOMEN AND PELVIS WITHOUT CONTRAST TECHNIQUE: Multidetector CT imaging of the abdomen and pelvis was performed following the standard protocol without IV contrast. COMPARISON:  None. FINDINGS: Lower chest: Coronary atherosclerotic calcification. Mild dependent atelectasis. 34 x 20 mm right cardiophrenic sulcus cyst. Hepatobiliary: No focal liver abnormality.Cholecystectomy with normal common bile duct diameter. Pancreas: Unremarkable. Spleen: Unremarkable. Adrenals/Urinary Tract: Negative adrenals. No hydronephrosis or stone. Mild generalized prominence of bladder wall thickness without convincing perivesicular fat stranding. Stomach/Bowel: No obstruction. No appendicitis. Moderate stool volume Vascular/Lymphatic: No acute vascular abnormality. No mass or adenopathy. Reproductive:Unremarkable for age. Other: No ascites or pneumoperitoneum. Musculoskeletal: Spinal findings reported on dedicated exam. IMPRESSION: 1. Borderline bladder wall thickening and perivesicular stranding, please correlate for cystitis symptoms. 2. Moderate stool volume without obstruction or rectal impaction. 3.  Aortic Atherosclerosis (ICD10-I70.0).  Coronary atherosclerosis. Electronically Signed   By: JoMonte Fantasia.D.   On: 08/31/2017 14:59   Dg Lumbar Spine 2-3 Views  Result Date: 08/27/2017 CLINICAL DATA:  Localization. EXAM: LUMBAR SPINE - 2-3 VIEW COMPARISON:  CT lumbar spine August 19, 2017 FINDINGS: Two intraoperative lateral radiographs of the lumbar spine. 1501 hours: Surgical instruments indicate the posterior elements of L3-4 and sacrum.  Discontinuous bridging rods at L3-4. 1503 hours: Surgical instruments indicate the posterior elements of L3-4 and sacrum. IMPRESSION: Lateral radiographs for surgical localization indicate the posterior elements of L3-4 and sacrum. Electronically Signed   By: CoElon Alas.D.   On: 08/27/2017 18:59   Dg Lumbar Spine 2-3 Views  Result Date: 08/27/2017 CLINICAL DATA:  L3-4 lumbar fusion revision EXAM: LUMBAR SPINE - 2-3 VIEW COMPARISON:  CT lumbar 08/19/2017 FINDINGS: Multilevel with hardware. Limited bony lumbar fusion detail due to C-arm technique. IMPRESSION: C-arm images the lumbar spine demonstrate multilevel lumbar fusion. Follow-up radiographs suggested for better bony detail. Electronically Signed   By: ChFranchot Gallo.D.   On: 08/27/2017 18:47   Ct Thoracic Spine Wo Contrast  Result Date: 08/31/2017 CLINICAL DATA:  Abdominal pain. Right lateral thigh pain from hip to knee. EXAM: CT THORACIC AND LUMBAR SPINE WITHOUT CONTRAST TECHNIQUE: Multidetector CT imaging of the thoracic and lumbar spine was performed without contrast. Multiplanar CT image reconstructions were also generated. COMPARISON:  Preoperative lumbar spine CT 08/19/2017 FINDINGS: CT THORACIC SPINE FINDINGS Alignment: Exaggerated thoracic kyphosis without listhesis. Vertebrae: T10 to L1/2 posterior fusion as described below. Lucency around bilateral T10 pedicle screws where there is no demonstrable solid posterior arthrodesis. 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 No acute fracture Paraspinal and other soft tissues: No acute finding Disc levels: Spondylosis in the mid and lower thoracic spine. Postoperative findings noted above. Lower thoracic facet spurring. No visible cord impingement. CT LUMBAR SPINE FINDINGS Segmentation:  5 lumbar type vertebral bodies Alignment: Physiologic Vertebrae: Posterior fusion on the right from L1 to the ilium  and on the left from L2 to the ilium. Iliac screws are new. There is ALIF plate at F0-Y6 and XLIF plate at V7-8. the posterior rods have been revised to span a L3 fracture extending obliquely across the vertebral body and into the right posterior elements. Remote L2 superior endplate fracture. Paraspinal and other soft tissues: Soft tissue gas within the laminectomy defect of L2-L4, correlating with recent surgery. There is superimposed incisional fluid without demonstrable mass effect on the thecal sac. Retroperitoneum is reported separately on dedicated abdominal CT Disc levels: T12- L1: Posterior-lateral solid arthrodesis. No visible impingement L1-L2: Posterior-lateral solid arthrodesis.  No visible impingement L2-L3: Discectomy and posterolateral fusion. Residual spurring chronically narrows the foramina. Laminectomy. Hardware obscures the canal. L3-L4: Solid fusion.  No visible impingement. L4-L5: Solid arthrodesis.  No visible impingement. L5-S1:Prior pseudoarthrosis with revision. The discectomy graft shows incorporation. Vacuum phenomenon has resolved within the disc space. No progressive lucency around the S1 screws. IMPRESSION: 1. Thoracic and lumbar spine fusion with recent revision to span L3 body and posterior element fractures. No unexpected finding. 2. Recent revised L5-S1 level with incorporating bone graft. 3. Bilateral lucency around T10 pedicle screws. No demonstrable T10-11 posterior-lateral arthrodesis. Electronically Signed   By: Monte Fantasia M.D.   On: 08/31/2017 14:54   Ct Lumbar Spine Wo Contrast  Result Date: 08/19/2017 CLINICAL DATA:  Low back pain, severe pain radiating to LEFT leg. History of spinal surgery May 2019. EXAM: CT LUMBAR SPINE WITHOUT CONTRAST TECHNIQUE: Multidetector CT imaging of the lumbar spine was performed without intravenous contrast administration. Multiplanar CT image reconstructions were also generated. COMPARISON:  CT lumbar spine May 26, 2017 FINDINGS:  SEGMENTATION: For the purposes of this report the last well-formed intervertebral disc space is reported as L5-S1. ALIGNMENT: Maintained lumbar lordosis. No malalignment. VERTEBRAE: Status post lower lumbar spine hardware revision with new screws transfixing the sacroiliac joints. The LEFT bridging rod is curved. New discontinuity of vertical rods at the L3-4 is postoperative with similar appearance to the intervening rod at L3. Status post thoracolumbar sacral fusion, L5-S1 ALIF, LEFT L2-3 XLIF. New moderate L3 compression fracture with obliquely oriented fracture fragment, vacuum phenomena within the fracture space, with mild distraction. The RIGHT L3 pedicle extends into the fracture space. Old mild L2 compression fracture. L5-S1, L3-4 arthrodesis, possible arthrodesis L2-3. No destructive bony lesion. PARASPINAL AND OTHER SOFT TISSUES: Small vertebral hematoma L3. Paraspinal muscle scarring. Status post cholecystectomy. Moderate calcific atherosclerosis aortic arch. DISC LEVELS: Status post posterior decompression L2-3 through L4-5 with incorporated bone graft material. No canal stenosis. Limited assessment of the neural foramen due to hardware artifact. At least moderate neural foraminal narrowing L5-S1. IMPRESSION: 1. Status post interval lumbar spine surgery revision with new bridging rods from L3 through sacrum with sacroiliac screws. 2. New L3 subacute appearing fracture with appearance of extension injury including distraction, no findings of infection. RIGHT L3 pedicle screw extends into fracture. 3. Otherwise stable appearance of the hardware and spine. Old mild L2 compression fracture. 4. Posterior decompression L2-3 through L4-5 without canal stenosis. At least moderate L5-S1 neural foraminal narrowing. 5. These results will be called to the ordering clinician or representative by the Radiologist Assistant, and communication documented in the PACS or zVision Dashboard. Aortic Atherosclerosis  (ICD10-I70.0). Electronically Signed   By: Elon Alas M.D.   On: 08/19/2017 21:39   Ct L-spine No Charge  Result Date:  08/31/2017 CLINICAL DATA:  Postop pain. Vertebral body fracture. Lumbar fusion revision 08/27/2017 EXAM: CT LUMBAR SPINE WITHOUT CONTRAST TECHNIQUE: Multidetector CT imaging of the lumbar spine was performed without intravenous contrast administration. Multiplanar CT image reconstructions were also generated. COMPARISON:  CT lumbar spine 08/19/2017 FINDINGS: Segmentation: Normal Alignment: Normal Vertebrae: Horizontal fracture through the L3 vertebral body similar to the prior study. The margins are sclerotic in this appears to be a subacute fracture without bony healing. Right L3 screw extends into the fracture and appears loose. No other fracture. Pedicle screw and posterior rod fusion in the lower thoracic spine extending into the sacrum and iliac bone bilaterally. The upper level of the fusion is not identified. The posterior rod at T12-L1 on the right has been cut in the interval. The rod is now continuous from L1 through the sacrum. The left rod is discontinuous at the L1-2 level on the left and is continuous from L2 through the sacrum on the left. These represent interval changes since the prior study. Left lateral plate fusion K3-5 unchanged. Anterior plate and screw fusion L5-S1 unchanged. Paraspinal and other soft tissues: No retroperitoneal hematoma. Nose paraspinous mass. Disc levels: Bilateral laminectomy L2 through L5. Posterior bony fusion is again noted and unchanged appears solid. No significant stenosis of the spinal canal. Moderate foraminal narrowing bilaterally at L2-3. Mild foraminal narrowing bilaterally L5-S1 due to spurring. IMPRESSION: Interval revision of multilevel thoracolumbar fusion. Horizontal fracture of L3 is unchanged without bony healing. Right L3 screw extends into the fracture line unchanged. Electronically Signed   By: Franchot Gallo M.D.   On:  08/31/2017 14:50   Dg C-arm 1-60 Min  Result Date: 08/27/2017 CLINICAL DATA:  Lumbar fusion and revision EXAM: DG C-ARM 61-120 MIN COMPARISON:  CT 08/19/2017 FINDINGS: Five low resolution intraoperative spot views of the lumbar spine. Total fluoroscopy time was 19 seconds. Posterior stabilization rods and multiple fixating screws. Bilateral SI joint fixating screws. Left lateral fixating screws are also noted. IMPRESSION: Intraoperative fluoroscopic assistance provided during spine surgery. Electronically Signed   By: Donavan Foil M.D.   On: 08/27/2017 18:40   [place] Ekg Site Rite  Result Date: 08/28/2017 If Site Rite image not attached, placement could not be confirmed due to current cardiac rhythm.   Disposition: Discharge disposition: 03-Skilled Nursing Facility       Discharge Instructions    Discharge patient   Complete by:  As directed    D/C to [facility] pending final ID recs/eval and S/P enema and BM   Discharge disposition:  03-Skilled Nursing Facility   Discharge patient date:  09/01/2017   Home infusion instructions [facility] May follow [place] Dosing Protocol; May administer Cathflo as needed to maintain patency of vascular access device.; Flushing of vascular access device: per [facility] Protocol: 0.9% NaCl pre/post medica...   Complete by:  As directed    Instructions:  May follow [place] Dosing Protocol   Instructions:  May administer Cathflo as needed to maintain patency of vascular access device.   Instructions:  Flushing of vascular access device: per [facility] Protocol: 0.9% NaCl pre/post medication administration and prn patency; Heparin 100 u/ml, 33m for implanted ports and Heparin 10u/ml, 584mfor all other central venous catheters.   Instructions:  May follow [facility] Anaphylaxis Protocol for First Dose Administration in the home: 0.9% NaCl at 25-50 ml/hr to maintain IV access for protocol meds. Epinephrine 0.3 ml IV/IM PRN and Benadryl 25-50 IV/IM PRN s/s of  anaphylaxis.   Instructions:  [facility] Infusion Coordinator (RN) to assist per patient IV care needs in the home PRN.      POD #8 s/p revision lumbar fusion with potential underlying wound infection.   - up with PT/OT, encourage ambulation, good progress yesterday  -Dilaudidfor pain, Valium for muscle spasms -Scripts signed and in chart for D/C             -Pt requesting additional px meds. Unable to provide oxycontin for long acting secondary to hx of reaction to percocet per pharmacy. Will add robaxin in between valium. We discussed that she is already on rather strong medication and already suffering from constipation and increasing her px meds is not an excellent option at this time as she is progressing well otherwise  - d/cSNF ([facility]) today   -D/C instructions printed and in chart  - F/U with ortho spine 2 weeks PO  - R lateral thigh pain likely secondary to IT band tension and sedentary lifestyle of late, tender to palpation, CT of thoracic and lumbar spine negative   - Abdominal pain secondary to constipation and potentially rifampin. CT negative for any other ominous pathology. ID has D/C'd rifampin  - Potential wound infection followed by ID, we appreciate their input - treating 4 wks as deep tissue infection,may extend further - vancomycin, with goal of 15-20 - rifampin discontinued secondary to abdominal px  - Ceftriaxone 2g IV q24h until f/u with ID (3 weeks post D/C)             - picc line placed  - follow up in the ID clinic in3 wks (Dr. Carlyle Basques)  -ForD/Cwill need2xweek bmp, weekly vanco trough with goal of 15-20. Also patient will need weekly sed rate, crp, and cbc   Signed: Justice Britain 09/03/2017, 7:27am

## 2017-09-01 NOTE — Clinical Social Work Placement (Addendum)
   CLINICAL SOCIAL WORK PLACEMENT  NOTE [facility]  Date:  09/01/2017  Patient Details  Name: Karen Castillo MRN: [id number] Date of Birth: 1940/07/31  Clinical Social Work is seeking post-discharge placement for this patient at the [place] level of care (*CSW will initial, date and re-position this form in  chart as items are completed):  Yes   Patient/family provided with [facility] Work Department's list of facilities offering this level of care within the geographic area requested by the patient (or if unable, by the patient's family).  Yes   Patient/family informed of their freedom to choose among providers that offer the needed level of care, that participate in Medicare, Medicaid or managed care program needed by the patient, have an available bed and are willing to accept the patient.  Yes   Patient/family informed of [facility]'s ownership interest in [facility] and [facility], as well as of the fact that they are under no obligation to receive care at these facilities.  PASRR submitted to EDS on 08/29/17     PASRR number received on       Existing PASRR number confirmed on       FL2 transmitted to all facilities in geographic area requested by pt/family on 08/29/17     FL2 transmitted to all facilities within larger geographic area on       Patient informed that his/her managed care company has contracts with or will negotiate with certain facilities, including the following:        Yes   Patient/family informed of bed offers received.  Patient chooses bed at [place], [facility]     Physician recommends and patient chooses bed at      Patient to be transferred to [place] on 09/01/17.  Patient to be transferred to facility by [facility]     Patient family notified on 09/01/17 of transfer.  Name of family member notified:  Daughter [name]   PHYSICIAN Please sign FL2     Additional Comment:     _______________________________________________ Alexander Mt, [place] 09/01/2017, 3:42 PM

## 2017-09-01 NOTE — Social Work (Signed)
Pt discharge delayed due to [facility] DHHS review: pt's PASSR under manual review by state agency; orthopaedic office aware, pt daughter aware, [facility] aware.   Will continue to follow until state approval has been received.   Alexander Mt, [place] Work [phone]

## 2017-09-01 NOTE — Social Work (Addendum)
30 day note faxed to [place] for signature, when received will facilitate discharge.   1:08pm- note received, clinicals submitted to PASSR. Pt also requires electronic discharge summary for discharge to SNF. Wells Guiles at [place] aware, will notify attending team.   CSW will arrange transport when summary available.   4:22pm- Pt PASSR still pending- have alerted care team and pt daughter. [facility] also aware. When PASSR received will confirm pick up through [facility].   Alexander Mt, [place] Work [phone]

## 2017-09-02 ENCOUNTER — Inpatient Hospital Stay (HOSPITAL_COMMUNITY): Payer: PPO

## 2017-09-02 LAB — BASIC METABOLIC PANEL
ANION GAP: 10 (ref 5–15)
BUN: <5 mg/dL — ABNORMAL LOW (ref 8–23)
CALCIUM: 8.8 mg/dL — AB (ref 8.9–10.3)
CO2: 26 mmol/L (ref 22–32)
CREATININE: 0.9 mg/dL (ref 0.44–1.00)
Chloride: 95 mmol/L — ABNORMAL LOW (ref 98–111)
Glucose, Bld: 122 mg/dL — ABNORMAL HIGH (ref 70–99)
Potassium: 3.3 mmol/L — ABNORMAL LOW (ref 3.5–5.1)
Sodium: 131 mmol/L — ABNORMAL LOW (ref 135–145)

## 2017-09-02 MED ORDER — POTASSIUM CHLORIDE CRYS ER 20 MEQ PO TBCR
20.0000 meq | EXTENDED_RELEASE_TABLET | Freq: Every day | ORAL | Status: DC
  Administered 2017-09-02 – 2017-09-04 (×3): 20 meq via ORAL
  Filled 2017-09-02 (×3): qty 1

## 2017-09-02 NOTE — Progress Notes (Signed)
    Patient 6 days PO revision decompression and fusion. Expected PO LBP with subsequent muscle strain. Adding robaxin has helped some. Constipation has resolved and pt now reports diarrhea. Her CC continues to be abdominal pain, she states it has worsened over the past 24hrs. She was unable to eat much for breakfast. Her D/C to [facility] is pending state approval per social work   Physical Exam: Vitals:   09/02/17 0247 09/02/17 0739  BP: 136/62 (!) 148/60  Pulse: 95 69  Resp: 16 17  Temp: 98.3 F (36.8 C) 97.7 F (36.5 C)  SpO2: 96% 95%    Dressing in place, needs reinforced, pt resting comfortably in hospital bed NVI  POD #6 s/p revision lumbar fusion with potential underlying wound infection.   - up with PT/OT, encourage ambulation  -Dilaudidfor pain, Valium and robaxin for muscle spasms -Scripts signed and in chart for D/C             -Pt requesting additional px meds. Unable to provide oxycontin for long acting secondary to hx of reaction to percocet per pharmacy.  We discussed that she is already on rather strong medication and already suffered from constipation and increasing her px meds is not an excellent option at this time as she is progressing well otherwise   - d/cSNF ([facility]) today, pending state approval -D/C instructions printed and in chart   - R lateral thigh pain likely secondary to IT band tension and sedentary lifestyle of late, tender to palpation, CT of thoracic and lumbar spine negative   - Abdominal pain secondary to constipation and potentially rifampin. CT negative for any other ominous pathology. ID has D/C'd rifampin. Constipation has resolved and pt now with diarrhea. Abdominal pain has increased over last 48hrs per pt, pt concerned the new ABX may not be tolerated well  - Potential wound infection followed by ID, we appreciate their input - treating 4 wk as deep tissue infection,may extend further -  vancomycin, with goal of 15-20 - rifampin discontinued secondary to abdominal px  -Ceftriaxone started yesterday to replace rifampin, pt concerned may be cause of increased abdominal px, reports "I cannot tolerate most ABX's"             - picc line placed  - follow up in the ID clinic in3 wks after D/C  -ForD/Cwill need2xweek bmp, weekly vanco trough with goal of 15-20. Also patient will need weekly sed rate, crp, and cbc

## 2017-09-02 NOTE — Social Work (Signed)
CSW still awaiting PASSR from [facility]. Pt daughter [name] informed and aware.  [employer] authorization good as long as pt discharges on or before 6/28.   Alexander Mt, [place] Work [phone]

## 2017-09-02 NOTE — Social Work (Addendum)
PASSR still under manual review with [place] DHHS.   11:14am- Individual will be by to assess pt today for PASSR.   11:20am- CSW spoke with pt daughter [name], she is aware of delay and that state is working on authorization. Elizabeth with admissions at [facility] also informed by CSW.   12:37pm- PASSR representative has stopped by and completed appointment, await determination/PASSR number.   3:54pm- CSW still awaiting PASSR from [facility]. Pt daughter [name] informed and aware.  [employer] authorization good as long as pt discharges on or before 6/28.   Alexander Mt, [place] Work [phone]

## 2017-09-02 NOTE — Progress Notes (Signed)
Physical Therapy Treatment Patient Details Name: Karen Castillo MRN: [id number] DOB: 06-29-1940 Today's Date: 09/02/2017    History of Present Illness Karen Castillo is a 77 y.o. female s/p revision fusion procedure admitted on 08/27/2017 with possible wound infection. Recovery course has been complicated by nausea and abdominal pain. Recent L5-S1 fusion on 07/09/17.  HX GERD and Bipolar.     PT Comments    Continuing work on functional mobility and activity tolerance, however today Karen Castillo was extremely limited by pain, and she ultimately refused any OOB activity despite our best efforts to educate on the need for mobility/OOB activity to stave off the effects of bedrest; We will continue our efforts at mobility/amb while she is here  Follow Up Recommendations  SNF;Supervision/Assistance - 24 hour     Equipment Recommendations  Rolling walker with 5" wheels;3in1 (PT)    Recommendations for Other Services       Precautions / Restrictions Precautions Precautions: Fall;Back Required Braces or Orthoses: Spinal Brace Spinal Brace: Applied in sitting position;Thoracolumbosacral orthotic    Mobility  Bed Mobility Overal bed mobility: Needs Assistance Bed Mobility: Rolling;Sidelying to Sit;Sit to Sidelying Rolling: Mod assist Sidelying to sit: Max assist;+2 for physical assistance     Sit to sidelying: Mod assist General bed mobility comments: Min A for rolling; Max assist of 2 to elevate tunk up into sitting. Mod A for BLEs management to return to supine, and max multimodal cues for safety coming to R sidelying to avoid twisting L shoulder/trunk backwards  Transfers                 General transfer comment: Unable; too much pain  Ambulation/Gait             General Gait Details: Refusing OOB activity due to too much pain   Stairs             Wheelchair Mobility    Modified Rankin (Stroke Patients Only)       Balance     Sitting balance-Leahy  Scale: Poor Sitting balance - Comments: Required Mod/Max assist sitting EOB before pt pulling herself back into bed                                    Cognition Arousal/Alertness: Awake/alert Behavior During Therapy: [facility] for tasks assessed/performed;Anxious Overall Cognitive Status: Within Functional Limits for tasks assessed                                        Exercises      General Comments        Pertinent Vitals/Pain Pain Assessment: Faces Faces Pain Scale: Hurts whole lot Pain Location: back, as well as abdominal pain Pain Descriptors / Indicators: Aching;Grimacing;Guarding(and pulling self back to bed) Pain Intervention(s): Premedicated before session;Repositioned    Home Living                      Prior Function            PT Goals (current goals can now be found in the care plan section) Acute Rehab PT Goals Patient Stated Goal: to stay in bed PT Goal Formulation: With patient Time For Goal Achievement: 09/12/17 Potential to Achieve Goals: Fair Progress towards PT goals: Not progressing toward goals - comment(limited by pain)  Frequency    Min 3X/week      PT Plan Current plan remains appropriate    Co-evaluation PT/OT/SLP Co-Evaluation/Treatment: Yes Reason for Co-Treatment: Other (comment)(Patient-centered decision based on pt's activity tolerance) PT goals addressed during session: Mobility/safety with mobility OT goals addressed during session: ADL's and self-care      AM-PAC PT "6 Clicks" Daily Activity  Outcome Measure  Difficulty turning over in bed (including adjusting bedclothes, sheets and blankets)?: Unable Difficulty moving from lying on back to sitting on the side of the bed? : Unable Difficulty sitting down on and standing up from a chair with arms (e.g., wheelchair, bedside commode, etc,.)?: Unable Help needed moving to and from a bed to chair (including a wheelchair)?: Total Help  needed walking in hospital room?: Total Help needed climbing 3-5 steps with a railing? : Total 6 Click Score: 6    End of Session   Activity Tolerance: Patient limited by pain Patient left: in bed;with call bell/phone within reach;Other (comment)(bed in semi-chair position) Nurse Communication: Mobility status PT Visit Diagnosis: Unsteadiness on feet (R26.81);Muscle weakness (generalized) (M62.81);Pain Pain - part of body: (back and abdomen)     Time: 4403-4742 PT Time Calculation (min) (ACUTE ONLY): 23 min  Charges:  $Therapeutic Activity: 8-22 mins                    G Codes:       Roney Marion, PT  Acute Rehabilitation Services Pager [phone] Office [place] 09/02/2017, 4:49 PM

## 2017-09-02 NOTE — Progress Notes (Signed)
Occupational Therapy Treatment Patient Details Name: Karen Castillo MRN: [id number] DOB: 1941-03-08 Today's Date: 09/02/2017    History of present illness Karen Castillo is a 77 y.o. female s/p revision fusion procedure admitted on 08/27/2017 with possible wound infection. Recovery course has been complicated by nausea and abdominal pain. Recent L5-S1 fusion on 07/09/17.  HX GERD and Bipolar.    OT comments  Pt not making progress towards OT goals today. Pt severely limited by pain. Despite multiple attempts and strategies to decrease anxiety and fear, as well as manage pain she declined any OOB activity once sitting EOB. Upon bed mobility completion (where she was sitting EOB) she began to yell "I've got to lay back down". Maximum patient education provided and maximum encouragement provided to no avail. OT will continue to follow acutely, and Pt continues to require post-acute rehab at SNF level.   Follow Up Recommendations  SNF    Equipment Recommendations  (defer to next venue)    Recommendations for Other Services PT consult;Other (comment)(Psych Consult)    Precautions / Restrictions Precautions Precautions: Fall;Back Precaution Comments: Pt able to verbalize 3/3 back precautions Required Braces or Orthoses: Spinal Brace Spinal Brace: Applied in sitting position;Thoracolumbosacral orthotic       Mobility Bed Mobility Overal bed mobility: Needs Assistance Bed Mobility: Rolling;Sidelying to Sit;Sit to Sidelying Rolling: Mod assist Sidelying to sit: Max assist;+2 for physical assistance     Sit to sidelying: Mod assist General bed mobility comments: Min A for rolling; Max assist of 2 to elevate tunk up into sitting. Mod A for BLEs management to return to supine, and max multimodal cues for safety coming to R sidelying to avoid twisting L shoulder/trunk backwards  Transfers                 General transfer comment: Unable; too much pain    Balance     Sitting  balance-Leahy Scale: Poor Sitting balance - Comments: Required Mod/Max assist sitting EOB before pt pulling herself back into bed                                   ADL either performed or assessed with clinical judgement   ADL Overall ADL's : Needs assistance/impaired                     Lower Body Dressing: Total assistance;Bed level                 General ADL Comments: unable to maintain sitting long enough to don brace     Vision       Perception     Praxis      Cognition Arousal/Alertness: Awake/alert Behavior During Therapy: Anxious Overall Cognitive Status: Within Functional Limits for tasks assessed                                 General Comments: Pt very anxious about moving and pain - despite preparation, building a plan together, and attempting breathing techniques Pt was VERY anxious and unable to follow commands upon reaching sitting position.        Exercises     Shoulder Instructions       General Comments      Pertinent Vitals/ Pain       Pain Assessment: Faces Faces Pain Scale: Hurts whole lot Pain  Location: back, as well as abdominal pain Pain Descriptors / Indicators: Aching;Grimacing;Guarding(and pulling self back to bed) Pain Intervention(s): Premedicated before session;Monitored during session;Other (comment)(attempted breathing techniques)  Home Living                                          Prior Functioning/Environment              Frequency  Min 2X/week        Progress Toward Goals  OT Goals(current goals can now be found in the care plan section)  Progress towards OT goals: Not progressing toward goals - comment(limited by pain/anxiety)  Acute Rehab OT Goals Patient Stated Goal: to stay in bed OT Goal Formulation: With patient Time For Goal Achievement: 09/13/17 Potential to Achieve Goals: Good  Plan Discharge plan remains appropriate     Co-evaluation    PT/OT/SLP Co-Evaluation/Treatment: Yes Reason for Co-Treatment: For patient/therapist safety;To address functional/ADL transfers;Other (comment)(activity tolerance and pain management) PT goals addressed during session: Mobility/safety with mobility OT goals addressed during session: ADL's and self-care      AM-PAC PT "6 Clicks" Daily Activity     Outcome Measure   Help from another person eating meals?: None Help from another person taking care of personal grooming?: A Little Help from another person toileting, which includes using toliet, bedpan, or urinal?: Total Help from another person bathing (including washing, rinsing, drying)?: A Lot Help from another person to put on and taking off regular upper body clothing?: A Lot Help from another person to put on and taking off regular lower body clothing?: Total 6 Click Score: 13    End of Session    OT Visit Diagnosis: Unsteadiness on feet (R26.81);Other abnormalities of gait and mobility (R26.89);Muscle weakness (generalized) (M62.81);Pain Pain - part of body: (back)   Activity Tolerance Patient limited by pain   Patient Left in bed;with call bell/phone within reach   Nurse Communication Mobility status;Precautions;Other (comment)(pain level)        Time: 1219-7588 OT Time Calculation (min): 24 min  Charges: OT General Charges $OT Visit: 1 Visit OT Treatments $Therapeutic Activity: 8-22 mins  Hulda Humphrey OTR/L [place] 09/02/2017, 5:30 PM

## 2017-09-03 DIAGNOSIS — M4626 Osteomyelitis of vertebra, lumbar region: Secondary | ICD-10-CM

## 2017-09-03 DIAGNOSIS — R110 Nausea: Secondary | ICD-10-CM

## 2017-09-03 DIAGNOSIS — M869 Osteomyelitis, unspecified: Secondary | ICD-10-CM

## 2017-09-03 DIAGNOSIS — R109 Unspecified abdominal pain: Secondary | ICD-10-CM

## 2017-09-03 DIAGNOSIS — K5900 Constipation, unspecified: Secondary | ICD-10-CM

## 2017-09-03 LAB — MAGNESIUM: Magnesium: 1.5 mg/dL — ABNORMAL LOW (ref 1.7–2.4)

## 2017-09-03 MED ORDER — MAGNESIUM SULFATE 2 GM/50ML IV SOLN
2.0000 g | Freq: Once | INTRAVENOUS | Status: AC
  Administered 2017-09-03: 2 g via INTRAVENOUS
  Filled 2017-09-03: qty 50

## 2017-09-03 NOTE — Social Work (Addendum)
CSW spoke with both attending MD and pt family this morning, pt PASSR still pending. [facility] came by to evaluate pt yesterday, will continue to follow for when we have PASSR. [facility] also aware.  4:47pm- CSW continuing to follow, clinicals for PASSR still under review by [place] DHHS. CSW spoke with pt daughter Karen Castillo and [employer]. CSW validated pt daughters feelings of confusion and frustration regarding this delay in discharge plans. Aware this is very challenging for entire care team, but PASSR is required by [facility] DHHS for all individuals discharging to SNF. Unable to place pt's legally without this information being received.     Alexander Mt, [place] Work [phone]

## 2017-09-03 NOTE — Progress Notes (Signed)
    Patient doing well  Has been ambulating  Has been c/o pf abdominal pain, for which a CT scan and recently a KUB was obtained, both of which were negative. This has improved. Now c/o right sided low back pain in the paraspinal musculature    Physical Exam: Vitals:   09/02/17 2319 09/03/17 0544  BP: (!) 173/60 (!) 152/57  Pulse:  66  Resp: 14 12  Temp: 98.3 F (36.8 C) 98.5 F (36.9 C)  SpO2: 95% 96%   Patient appears comfortable Dressing in place NVI  POD s/p revision fusion procedure for lumbar fracture, awaiting discharge to SNF. Per social work, patient is awaiting PASSR, whatever that is. Right low back pain is clinically muscular in nature. Patient needs to mobilze.  - up with PT/OT, encourage ambulation - Dilaudid q 4 hours for pain, Valium for muscle spasms - OK to transfer when able to per social work - abx per ID - mobilization needs to be encouraged

## 2017-09-04 DIAGNOSIS — F319 Bipolar disorder, unspecified: Secondary | ICD-10-CM | POA: Diagnosis not present

## 2017-09-04 DIAGNOSIS — Z4889 Encounter for other specified surgical aftercare: Secondary | ICD-10-CM | POA: Diagnosis not present

## 2017-09-04 DIAGNOSIS — R279 Unspecified lack of coordination: Secondary | ICD-10-CM | POA: Diagnosis not present

## 2017-09-04 DIAGNOSIS — M4326 Fusion of spine, lumbar region: Secondary | ICD-10-CM | POA: Diagnosis not present

## 2017-09-04 DIAGNOSIS — M960 Pseudarthrosis after fusion or arthrodesis: Secondary | ICD-10-CM | POA: Diagnosis not present

## 2017-09-04 DIAGNOSIS — R262 Difficulty in walking, not elsewhere classified: Secondary | ICD-10-CM | POA: Diagnosis not present

## 2017-09-04 DIAGNOSIS — M549 Dorsalgia, unspecified: Secondary | ICD-10-CM | POA: Diagnosis not present

## 2017-09-04 DIAGNOSIS — G8918 Other acute postprocedural pain: Secondary | ICD-10-CM | POA: Diagnosis not present

## 2017-09-04 DIAGNOSIS — Z743 Need for continuous supervision: Secondary | ICD-10-CM | POA: Diagnosis not present

## 2017-09-04 DIAGNOSIS — D649 Anemia, unspecified: Secondary | ICD-10-CM | POA: Diagnosis not present

## 2017-09-04 DIAGNOSIS — D500 Iron deficiency anemia secondary to blood loss (chronic): Secondary | ICD-10-CM | POA: Diagnosis not present

## 2017-09-04 MED ORDER — HEPARIN SOD (PORK) LOCK FLUSH 100 UNIT/ML IV SOLN
250.0000 [IU] | INTRAVENOUS | Status: AC | PRN
  Administered 2017-09-04: 250 [IU]

## 2017-09-04 NOTE — Progress Notes (Signed)
Physical Therapy Treatment Patient Details Name: Karen Castillo MRN: [id number] DOB: September 13, 1940 Today's Date: 09/04/2017    History of Present Illness Karen Castillo is a 77 y.o. female s/p revision fusion procedure admitted on 08/27/2017 with possible wound infection. Recovery course has been complicated by nausea and abdominal pain. Recent L5-S1 fusion on 07/09/17.  HX GERD and Bipolar.     PT Comments    Pt was able to walk a short distance to the bathroom and down the hall with me today.  Second person needed to follow with the chair for safety and to encourage increased gait distance.  Pt was only able to sit in the chair 5 mins before she had to return to bed due to pain in her back.  RN bringing pain meds.    Follow Up Recommendations  SNF;Supervision/Assistance - 24 hour     Equipment Recommendations  Rolling walker with 5" wheels;3in1 (PT)    Recommendations for Other Services   NA     Precautions / Restrictions Precautions Precautions: Fall;Back Required Braces or Orthoses: Spinal Brace Spinal Brace: Applied in sitting position;Thoracolumbosacral orthotic    Mobility  Bed Mobility Overal bed mobility: Needs Assistance Bed Mobility: Rolling;Sidelying to Sit;Sit to Sidelying Rolling: Min assist Sidelying to sit: Min assist     Sit to sidelying: Mod assist General bed mobility comments: Min assist to support trunk during log roll to left side, min assist at trunk to lift up to sitting EOB.  Mod assist to help lift legs back into bed to return to sidelying.   Transfers Overall transfer level: Needs assistance Equipment used: Rolling walker (2 wheeled) Transfers: Sit to/from Stand Sit to Stand: Min assist         General transfer comment: Min assist to stand from elevated bed and lower toilet to support trunk to power up.    Ambulation/Gait Ambulation/Gait assistance: Min assist;+2 safety/equipment Gait Distance (Feet): 65 Feet Assistive device: Rolling walker (2  wheeled) Gait Pattern/deviations: Step-through pattern;Trunk flexed     General Gait Details: Pt able to walk a short distance down the hallway with chair to follow to encourage increased gait distance and safety.  Min assist at trunk for support over weak and shakey legs.  Verbal cues for upright posture.           Balance Overall balance assessment: Needs assistance Sitting-balance support: Feet supported;Bilateral upper extremity supported Sitting balance-Leahy Scale: Fair     Standing balance support: Bilateral upper extremity supported;Single extremity supported Standing balance-Leahy Scale: Poor                              Cognition Arousal/Alertness: Awake/alert Behavior During Therapy: WFL for tasks assessed/performed Overall Cognitive Status: Within Functional Limits for tasks assessed                                               Pertinent Vitals/Pain Pain Assessment: Faces Faces Pain Scale: Hurts whole lot Pain Location: back Pain Descriptors / Indicators: Guarding;Grimacing Pain Intervention(s): Limited activity within patient's tolerance;Monitored during session;Repositioned;Heat applied           PT Goals (current goals can now be found in the care plan section) Acute Rehab PT Goals Patient Stated Goal: to decrease pain Progress towards PT goals: Progressing toward goals  Frequency    Min 5X/week      PT Plan Frequency needs to be updated       AM-PAC PT "6 Clicks" Daily Activity  Outcome Measure  Difficulty turning over in bed (including adjusting bedclothes, sheets and blankets)?: A Little Difficulty moving from lying on back to sitting on the side of the bed? : A Little Difficulty sitting down on and standing up from a chair with arms (e.g., wheelchair, bedside commode, etc,.)?: A Little Help needed moving to and from a bed to chair (including a wheelchair)?: A Little Help needed walking in hospital  room?: A Little Help needed climbing 3-5 steps with a railing? : A Lot 6 Click Score: 17    End of Session Equipment Utilized During Treatment: Back brace Activity Tolerance: Patient limited by pain Patient left: in bed;with call bell/phone within reach Nurse Communication: Patient requests pain meds PT Visit Diagnosis: Unsteadiness on feet (R26.81);Muscle weakness (generalized) (M62.81);Pain Pain - Right/Left: (lower) Pain - part of body: (back)     Time: 7622-6333 PT Time Calculation (min) (ACUTE ONLY): 31 min  Charges:  $Gait Training: 8-22 mins $Therapeutic Activity: 8-22 mins          Kong Packett B. [place], [place], DPT [phone]            09/04/2017, 10:46 AM

## 2017-09-04 NOTE — Progress Notes (Signed)
Pharmacy Antibiotic Note  Karen Castillo is a 77 y.o. 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  Pharmacy has been consulted for Vancomycin dosing. Also started on ceftriaxone per ID. Afebrile, WBC down to wnl. SCr stable 0.9 (last on 6/26). Last vancomycin trough therapeutic on 6/22. Lumbar wound culture negative. Per ID recommendations, to treat x 4 weeks through 7/17.  Plan: Vancomycin 750 mg IV every 12 hours Ceftriaxone 2g IV q24h per MD Monitor clinical progress, c/s, renal function, vancomycin trough at least weekly Treating x 4wks - OPAT completed  Height: 5\' 4"  (162.6 cm) Weight: 225 lb (102.1 kg) IBW/kg (Calculated) : 54.7  Temp (24hrs), Avg:98.2 F (36.8 C), Min:97.8 F (36.6 C), Max:98.5 F (36.9 C)  Recent Labs  Lab 08/28/17 1135 08/29/17 0100 08/30/17 0756 08/31/17 1620 09/01/17 1151 09/02/17 1240  WBC  --  14.2* 8.6 9.6  --   --   CREATININE 0.85 0.82  --  0.92 1.07* 0.90  VANCOTROUGH  --  15  --   --   --   --     Estimated Creatinine Clearance: 62.8 mL/min (by C-G formula based on SCr of 0.9 mg/dL).    Allergies  Allergen Reactions  . Ciprofloxacin Swelling    Tongue swells  . Cyclobenzaprine Swelling    Feet swell   . Pregabalin Anaphylaxis, Shortness Of Breath, Swelling and Other (See Comments)    Dizziness also  . Fluvastatin Other (See Comments)    Leg pain   . Meloxicam Swelling    Feet became swollen  . Metaxalone Rash and Other (See Comments)    Flushing of the skin and sore throat, too  . Pramipexole Nausea And Vomiting  . Pravastatin Other (See Comments)    Leg pain  . Rosuvastatin Other (See Comments)    Leg pain  . Sertraline Other (See Comments)    Sensitive teeth  . Statins Other (See Comments)    Leg pain     . Amitriptyline Other (See Comments)    Weird feeling all over  . Arthrotec [Diclofenac-Misoprostol] Diarrhea  . Augmentin  [Amoxicillin-Pot Clavulanate] Diarrhea    Has patient had a PCN reaction causing immediate rash, facial/tongue/throat swelling, SOB or lightheadedness with hypotension: No Has patient had a PCN reaction causing severe rash involving mucus membranes or skin necrosis: No Has patient had a PCN reaction that required hospitalization: No Has patient had a PCN reaction occurring within the last 10 years: No If all of the above answers are "NO", then may proceed with Cephalosporin use.   . Bromfenac Diarrhea and Nausea Only  . Cephalexin Diarrhea and Nausea Only  . Conj Estrog-Medroxyprogest Ace Other (See Comments)    Unsure of reaction type Couldn't tolerate  . Diclofenac-Misoprostol Nausea Only and Diarrhea  . Erythromycin Diarrhea and Nausea Only  . Esomeprazole Diarrhea  . Esomeprazole Magnesium Nausea Only  . Levofloxacin Other (See Comments) and Nausea Only    Stomach upset  . Methadone Diarrhea  . Metoclopramide Diarrhea and Nausea Only  . Oxycodone Hives, Swelling and Rash  . Propoxyphene Diarrhea and Nausea Only  . Rofecoxib Diarrhea and Nausea Only  . Sulfur Diarrhea and Nausea Only  . Tramadol Diarrhea  . Zoloft [Sertraline Hcl] Other (See Comments)    Sensitivity with teeth   Elicia Lamp, PharmD, BCPS Clinical Pharmacist Clinical phone for 09/04/2017 until 3:30pm: [phone] If after 3:30pm, please call main pharmacy at: [phone] 09/04/2017  11:20 AM

## 2017-09-04 NOTE — Social Work (Addendum)
Pt PASSR received from [facility] [phone] F.   Will need summary updated with todays date and facility requesting information about stop date for iv abx. CSW left message for MD/PA with Network engineer at [employer], [place] following to support discharge.  Alexander Mt, [place] Work [phone]

## 2017-09-04 NOTE — Progress Notes (Signed)
Patient's examination and treatment plan unchanged. Awaiting transfer to SNF. Will continue current plan and await transfer.Marland KitchenMarland Kitchen

## 2017-09-04 NOTE — Clinical Social Work Placement (Addendum)
   CLINICAL SOCIAL WORK PLACEMENT  NOTE [facility] RN to call report to [phone] ext [phone]  Date:  09/04/2017  Patient Details  Name: Karen Castillo MRN: [id number] Date of Birth: 14-Nov-1940  Clinical Social Work is seeking post-discharge placement for this patient at the [place] level of care (*CSW will initial, date and re-position this form in  chart as items are completed):  Yes   Patient/family provided with [place] Work Department's list of facilities offering this level of care within the geographic area requested by the patient (or if unable, by the patient's family).  Yes   Patient/family informed of their freedom to choose among providers that offer the needed level of care, that participate in Medicare, Medicaid or managed care program needed by the patient, have an available bed and are willing to accept the patient.  Yes   Patient/family informed of [place]'s ownership interest in [facility] and [facility], as well as of the fact that they are under no obligation to receive care at these facilities.  PASRR submitted to EDS on 08/29/17     PASRR number received on  09/04/2017  Existing PASRR number confirmed on       FL2 transmitted to all facilities in geographic area requested by pt/family on 08/29/17     FL2 transmitted to all facilities within larger geographic area on       Patient informed that his/her managed care company has contracts with or will negotiate with certain facilities, including the following:        Yes   Patient/family informed of bed offers received.  Patient chooses bed at [facility], [facility]     Physician recommends and patient chooses bed at      Patient to be transferred to [place] [facility] on 09/04/2017  Patient to be transferred to facility by [place]     Patient family notified on 09/04/2017 of transfer.  Name of family member notified:  [facility]     PHYSICIAN Please update  discharge summary with today's date and stop date for iv rocephin  Additional Comment:    _______________________________________________ Alexander Mt, [place] 09/04/2017, 10:02 AM

## 2017-09-04 NOTE — Social Work (Addendum)
Clinical Social Worker facilitated patient discharge including contacting patient family and facility to confirm patient discharge plans.  Clinical information faxed to facility and family agreeable with plan.  CSW arranged ambulance transport via [facility] at 2:30pm to [place], rm 701.  RN to call report to [phone] ext [phone]  Clinical Social Worker will sign off for now as social work intervention is no longer needed. Please consult [place] again if new need arises.  Alexander Mt, [place] Social Worker [phone]

## 2017-09-04 NOTE — Progress Notes (Signed)
Patient d/c to [facility] [place], transported by [employer]. Report called to nurse Almyra Free  and also gave report to [facility] admission nurse. Family aware of of d/c plan.  Alyzah Pelly, [place] Ringer, RN

## 2017-09-06 DIAGNOSIS — D649 Anemia, unspecified: Secondary | ICD-10-CM | POA: Diagnosis not present

## 2017-09-06 DIAGNOSIS — R262 Difficulty in walking, not elsewhere classified: Secondary | ICD-10-CM | POA: Diagnosis not present

## 2017-09-06 DIAGNOSIS — G8918 Other acute postprocedural pain: Secondary | ICD-10-CM | POA: Diagnosis not present

## 2017-09-06 DIAGNOSIS — M549 Dorsalgia, unspecified: Secondary | ICD-10-CM | POA: Diagnosis not present

## 2017-09-07 ENCOUNTER — Other Ambulatory Visit: Payer: Self-pay

## 2017-09-07 ENCOUNTER — Other Ambulatory Visit: Payer: Self-pay | Admitting: *Deleted

## 2017-09-07 NOTE — Patient Outreach (Signed)
[facility]) Care Management  09/07/2017  Karen Castillo 1940-03-13 [id number]   CSW was able to make initial outreach with pt's daughter, [name], who reports she is HCPOA. Daughter was able to confirm pt's identity and CSW introduced [facility] program and reason for call.   Per daughter, pt has an initial back surgery Jul 09, 2017. She was released to home after 2 days per daughter, and became weak and unable to get out of bed.  "she wasn't eating and couldn't walk".  She was readmitted to the hospital and then sent to SNF rehab [facility]). 3 days into the SNF stay, she had an allergic reaction to a RX.  She was readmitted to hospital, treated and released to a different SNF ([facility]). After about 3 weeks at SNF, she had a "new pain" arise and was taken to the hospital and readmitted due to "a broken bone in her back" which required a 2nd back surgery.  She now is at [place] (admitted this past weekend) and per daughter, she is "much closer to home and the therapy is going very well".   Pt resides with daughter, son in law and a grandson. Per daughter, pt was independent prior to all of the events; "she was driving, doing laundry and very independent'.   CSW advised daughter of plans to visit pt while  at [facility] and leave some info on [facility] Programs. CSW will follow along while pt is at [facility] and further assess, support and assist with coordination of dc needs with SNF rep.     Eduard [name], MSW, [place] Worker  [place] [phone]

## 2017-09-09 ENCOUNTER — Other Ambulatory Visit: Payer: Self-pay | Admitting: *Deleted

## 2017-09-09 NOTE — Patient Outreach (Signed)
[facility]) Care Management  09/09/2017  HUMA IMHOFF 10/23/1940 [id number]    CSW met with pt at [facility] where she is residing for rehab. Pt acknowledged knowing I had called and spoke with her daughter. CSW provided pt with info on the [facility] program.  Pt reports her pain has limiting her rehab (on Dilaudid) and had a rough night due to being constipated.  She does not anticipate any needs at dc aside from [facility] as she has all necessary DME from previous back surgeries. She is optimistic and hopeful to be home soon.  CSW will plan a call to SNF next week for update on progress and dc plans.      Eduard [name], MSW, [place] Worker  [place] [phone]

## 2017-09-16 ENCOUNTER — Other Ambulatory Visit: Payer: Self-pay | Admitting: *Deleted

## 2017-09-16 DIAGNOSIS — M960 Pseudarthrosis after fusion or arthrodesis: Secondary | ICD-10-CM | POA: Diagnosis not present

## 2017-09-16 NOTE — Patient Outreach (Signed)
[facility]) Care Management  09/16/2017  CHANIA KOCHANSKI 03-31-1940 [id number]   CSW spoke with SNF rep who indicates plans for pt to dc home tomorrow. She will have IV antibiotics at home as well as [facility] care arranged- await final update from SNF.   Eduard [name], MSW, [place] Worker  [place] [phone]

## 2017-09-17 DIAGNOSIS — Z981 Arthrodesis status: Secondary | ICD-10-CM | POA: Diagnosis not present

## 2017-09-17 DIAGNOSIS — M549 Dorsalgia, unspecified: Secondary | ICD-10-CM | POA: Diagnosis not present

## 2017-09-17 DIAGNOSIS — M4636 Infection of intervertebral disc (pyogenic), lumbar region: Secondary | ICD-10-CM | POA: Diagnosis not present

## 2017-09-17 DIAGNOSIS — T8142XD Infection following a procedure, deep incisional surgical site, subsequent encounter: Secondary | ICD-10-CM | POA: Diagnosis not present

## 2017-09-17 DIAGNOSIS — Z4889 Encounter for other specified surgical aftercare: Secondary | ICD-10-CM | POA: Diagnosis not present

## 2017-09-17 DIAGNOSIS — M1990 Unspecified osteoarthritis, unspecified site: Secondary | ICD-10-CM | POA: Diagnosis not present

## 2017-09-17 DIAGNOSIS — M6281 Muscle weakness (generalized): Secondary | ICD-10-CM | POA: Diagnosis not present

## 2017-09-17 DIAGNOSIS — Z452 Encounter for adjustment and management of vascular access device: Secondary | ICD-10-CM | POA: Diagnosis not present

## 2017-09-17 DIAGNOSIS — M5441 Lumbago with sciatica, right side: Secondary | ICD-10-CM | POA: Diagnosis not present

## 2017-09-18 ENCOUNTER — Other Ambulatory Visit: Payer: Self-pay | Admitting: Pharmacist

## 2017-09-18 ENCOUNTER — Other Ambulatory Visit: Payer: Self-pay | Admitting: *Deleted

## 2017-09-18 ENCOUNTER — Telehealth: Payer: Self-pay | Admitting: Pharmacist

## 2017-09-18 DIAGNOSIS — M1990 Unspecified osteoarthritis, unspecified site: Secondary | ICD-10-CM | POA: Diagnosis not present

## 2017-09-18 DIAGNOSIS — M4636 Infection of intervertebral disc (pyogenic), lumbar region: Secondary | ICD-10-CM | POA: Diagnosis not present

## 2017-09-18 NOTE — Telephone Encounter (Signed)
Vinnie Level from [facility] called regarding patient's IV antibiotics.  Patient was just released from SNF and transferred care over to home health for her IV vancomycin and ceftriaxone for her spinal infection.  Labs received and patient's vanc trough was 27.8 and SCr up to 1.96 (previously 0.90 on 6/26).  Alerted Dr. Baxter Flattery. [place] will hold vancomycin over the weekend and recheck vanc trough and SCr on Monday 7/15. Also gave orders for some hydration - 1 L q day through Sunday. Vinnie Level verbalized understanding of orders. Will f/u with labs on Monday.

## 2017-09-18 NOTE — Progress Notes (Unsigned)
OPAT pharmacy lab review  

## 2017-09-18 NOTE — Patient Outreach (Signed)
[facility]) Care Management  09/18/2017  Karen Castillo [birth year] [id number]   CSW was able to speak with pt's daughter, [name], by phone today who reports pt was released from SNF on 09/17/2017. She also reports the [facility] RN, PT and OT have already been out and the RN has taught the family how to do the IV antibiotics. "Everything is going smoothly".   CSW will place order for [name] phone f/u for SNF discharge/ transition of care screening.   CSW will plan a call in the next week for follow up and further screening of CSW needs; none identified at this time.   Eduard [name], MSW, [place] Worker  [place] [phone]

## 2017-09-19 DIAGNOSIS — M4636 Infection of intervertebral disc (pyogenic), lumbar region: Secondary | ICD-10-CM | POA: Diagnosis not present

## 2017-09-19 DIAGNOSIS — M1990 Unspecified osteoarthritis, unspecified site: Secondary | ICD-10-CM | POA: Diagnosis not present

## 2017-09-20 DIAGNOSIS — M4636 Infection of intervertebral disc (pyogenic), lumbar region: Secondary | ICD-10-CM | POA: Diagnosis not present

## 2017-09-20 DIAGNOSIS — M1990 Unspecified osteoarthritis, unspecified site: Secondary | ICD-10-CM | POA: Diagnosis not present

## 2017-09-21 DIAGNOSIS — M1990 Unspecified osteoarthritis, unspecified site: Secondary | ICD-10-CM | POA: Diagnosis not present

## 2017-09-21 DIAGNOSIS — T8140XA Infection following a procedure, unspecified, initial encounter: Secondary | ICD-10-CM | POA: Diagnosis not present

## 2017-09-21 DIAGNOSIS — M4636 Infection of intervertebral disc (pyogenic), lumbar region: Secondary | ICD-10-CM | POA: Diagnosis not present

## 2017-09-21 DIAGNOSIS — Z792 Long term (current) use of antibiotics: Secondary | ICD-10-CM | POA: Diagnosis not present

## 2017-09-22 DIAGNOSIS — M1990 Unspecified osteoarthritis, unspecified site: Secondary | ICD-10-CM | POA: Diagnosis not present

## 2017-09-22 DIAGNOSIS — M4636 Infection of intervertebral disc (pyogenic), lumbar region: Secondary | ICD-10-CM | POA: Diagnosis not present

## 2017-09-22 NOTE — Telephone Encounter (Signed)
Patient is on vanc + ceftriaxone for a spinal infection.  Patient had repeat labs - SCr still up at 2 and vanc random is 16. Patient's stop date is tomorrow 7/17. Last note from Dr. Baxter Flattery says to decide at f/u if needs extension of IV antibiotics. Patient does not have f/u until next week 7/24.  Please advise whether to pull patient's PICC tomorrow or extend antibiotics until she is seen in clinic.  Of note, last dose of vanc was Thursday 7/11 before she was transferred from SNF to [facility] home health where it was noted that her SCr had increased from 0.90 on 6/26 to 1.98 on 7/11. Repeat SCr still elevated at 2 on 7/15. She received hydration over the weekend on both Saturday and Sunday.  She has continued to receive ceftriaxone daily.

## 2017-09-23 DIAGNOSIS — M1990 Unspecified osteoarthritis, unspecified site: Secondary | ICD-10-CM | POA: Diagnosis not present

## 2017-09-23 DIAGNOSIS — M4636 Infection of intervertebral disc (pyogenic), lumbar region: Secondary | ICD-10-CM | POA: Diagnosis not present

## 2017-09-24 ENCOUNTER — Other Ambulatory Visit: Payer: Self-pay

## 2017-09-24 ENCOUNTER — Other Ambulatory Visit: Payer: Self-pay | Admitting: *Deleted

## 2017-09-24 DIAGNOSIS — T8142XD Infection following a procedure, deep incisional surgical site, subsequent encounter: Secondary | ICD-10-CM | POA: Diagnosis not present

## 2017-09-24 DIAGNOSIS — I10 Essential (primary) hypertension: Secondary | ICD-10-CM | POA: Diagnosis not present

## 2017-09-24 DIAGNOSIS — Z452 Encounter for adjustment and management of vascular access device: Secondary | ICD-10-CM | POA: Diagnosis not present

## 2017-09-24 DIAGNOSIS — M1991 Primary osteoarthritis, unspecified site: Secondary | ICD-10-CM | POA: Diagnosis not present

## 2017-09-24 DIAGNOSIS — D62 Acute posthemorrhagic anemia: Secondary | ICD-10-CM | POA: Diagnosis not present

## 2017-09-24 NOTE — Telephone Encounter (Signed)
Per Dr. Baxter Flattery, called [facility] and gave verbal order to pull patient's PICC line today.

## 2017-09-24 NOTE — Patient Outreach (Signed)
[facility]) Care Management  09/24/2017  Karen Castillo [birth year] [id number]   CSW spoke with pt who reports she is doing fairly well at home; post SNF dc. She does admit to a fall on 09/22/2017 where she was in a rolling chair in the bathroom at the sink. She apparently was home alone at time of fall and fortunately had a [place] visit planned- they arrived and could not get in and called daughter who came home and found her in the floor. " I am more sore". They called her PCP who reported to call back if not better/as needed.  Pt states she typically is "set up with a cooler and everything I need in her room while home alone. Her grandson typically comes home at lunch time.  She reports the IV ABX are ending and she continues to have PT, OT and RN visits with HH.   CSW discussed concern related to her fall and being alone some at home. CSW will mail pt information on life alert program to consider.   Pt denies any CSW needs as well as CSW has identified no further needs at this time and will plan CSW referral closure. CSW will update [facility] team and PCP.  Please re-consult/order CSW if needs arise.    Eduard [name], MSW, [place] Worker  [place] [phone]

## 2017-09-24 NOTE — Patient Outreach (Signed)
Transition of care:  New referral on 09/21/2017 for transition of care. Initial successful outreach on 09/24/2017.  Patient reports recent back surgery and short term rehab stay at [facility].  Patient reports that she is doing well. Reports home health PT and nursing with patient daily due to IV antibiotics. Reports she completed her IV antibiotics yesterday.  Patient reports she had one fall slipping off rolling stool in bathroom.  Patient reports she is sore but no new injury.  Patient reports that she has a follow up planned with Infectious disease next week. Denies follow up with primary MD.  Patient reports that she has transportation to MD appointment and is taking her medications as prescribed.   PLAN: offered home visit for 09/30/2017 and patient has accepted.  Will plan to follow for 31 days. Provided my contact information and confirmed address.  Tomasa Rand, RN, BSN, CEN [facility] [employer] [phone]

## 2017-09-24 NOTE — Telephone Encounter (Signed)
Thank you :)

## 2017-09-24 NOTE — Telephone Encounter (Signed)
Pull picc line

## 2017-09-29 DIAGNOSIS — I10 Essential (primary) hypertension: Secondary | ICD-10-CM | POA: Diagnosis not present

## 2017-09-29 DIAGNOSIS — Z452 Encounter for adjustment and management of vascular access device: Secondary | ICD-10-CM | POA: Diagnosis not present

## 2017-09-29 DIAGNOSIS — T8142XD Infection following a procedure, deep incisional surgical site, subsequent encounter: Secondary | ICD-10-CM | POA: Diagnosis not present

## 2017-09-29 DIAGNOSIS — M1991 Primary osteoarthritis, unspecified site: Secondary | ICD-10-CM | POA: Diagnosis not present

## 2017-09-29 DIAGNOSIS — D62 Acute posthemorrhagic anemia: Secondary | ICD-10-CM | POA: Diagnosis not present

## 2017-09-30 ENCOUNTER — Ambulatory Visit (INDEPENDENT_AMBULATORY_CARE_PROVIDER_SITE_OTHER): Payer: PPO | Admitting: Internal Medicine

## 2017-09-30 ENCOUNTER — Other Ambulatory Visit: Payer: Self-pay

## 2017-09-30 ENCOUNTER — Encounter: Payer: Self-pay | Admitting: Internal Medicine

## 2017-09-30 VITALS — BP 122/76 | HR 63 | Temp 97.7°F

## 2017-09-30 DIAGNOSIS — M869 Osteomyelitis, unspecified: Secondary | ICD-10-CM | POA: Diagnosis not present

## 2017-09-30 DIAGNOSIS — M4626 Osteomyelitis of vertebra, lumbar region: Secondary | ICD-10-CM

## 2017-09-30 NOTE — Progress Notes (Signed)
Patient ID: Karen Castillo, female   DOB: [birth year], 77 y.o.   MRN: [id number]  HPI  Karen Castillo is a 77 y.o. female with history of chronic back pain, HTN, bipolar disorder, OSA, who has had hx of lumbar fusion revision of L-S1 procedure on 5/2 but post operatively had ongoing back/hip pain until becoming intolerable. She was found to have L3-L4 fracture. She was admitted for revision since she had failed conservative pain management. Intra-operatively, tan-colored, possibly purulent fluid collection vs. seroma was noted as they dissected paraspinal musculature. Frozen section did not suggests any PMNs. cx sent due to concern for infection not previously known. Swab - did not show any bacteria or pmns on gram stain. She had HW replacement in addn to pulse lavage of antibiotics to her affected area. 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 Finished 4 wk  Lab Results  Component Value Date   ESRSEDRATE 55 (H) 08/28/2017      Outpatient Encounter Medications as of 09/30/2017  Medication Sig  . allopurinol (ZYLOPRIM) 100 MG tablet Take 100 mg by mouth daily.  Marland Kitchen amLODipine (NORVASC) 10 MG tablet Take 10 mg by mouth daily.  Marland Kitchen aspirin EC 81 MG tablet Take 81 mg by mouth daily.  Marland Kitchen atenolol (TENORMIN) 100 MG tablet Take 100 mg by mouth at bedtime.   Marland Kitchen azelastine (ASTELIN) 0.1 % nasal spray Place 1-2 sprays into both nostrils 2 (two) times daily as needed for rhinitis or allergies.   . Calcium Carb-Cholecalciferol (CALCIUM+D3 PO) Take 1 tablet by mouth daily.   . Cholecalciferol (VITAMIN D3) 2000 units TABS Take 2,000 Units by mouth daily.  . cloNIDine (CATAPRES) 0.1 MG tablet Take 0.1 mg by mouth at bedtime.  . cycloSPORINE (RESTASIS) 0.05 % ophthalmic emulsion Place 1 drop into both eyes 4 (four) times daily.  . diazepam (VALIUM) 5 MG tablet Take 1 tablet (5 mg total) by mouth every 6 (six) hours as needed for muscle spasms (for  spasms).  . lansoprazole (PREVACID) 30 MG capsule Take 30 mg by mouth daily before breakfast. 30 minutes before breakfast  . latanoprost (XALATAN) 0.005 % ophthalmic solution Place 1 drop into both eyes at bedtime.  Marland Kitchen losartan-hydrochlorothiazide (HYZAAR) 100-25 MG tablet Take 1 tablet by mouth daily.  . Melatonin 10 MG TABS Take 10 mg by mouth at bedtime. (2100)  . methocarbamol (ROBAXIN) 500 MG tablet Take 1 tablet (500 mg total) by mouth every 6 (six) hours as needed for muscle spasms (in between valium).  . nortriptyline (PAMELOR) 10 MG capsule Take 10 mg by mouth at bedtime.   . ondansetron (ZOFRAN) 4 MG/2ML SOLN injection Inject 2 mLs (4 mg total) into the vein every 6 (six) hours as needed for nausea or vomiting.  . polyethylene glycol (MIRALAX / GLYCOLAX) packet Take 17 g by mouth daily.  . promethazine (PHENERGAN) 12.5 MG tablet Take 12.5 mg by mouth 2 (two) times daily as needed for nausea or vomiting.   . senna-docusate (SENOKOT-S) 8.6-50 MG tablet Take 2 tablets by mouth 2 (two) times daily.  Marland Kitchen spironolactone (ALDACTONE) 25 MG tablet Take 25 mg by mouth daily.  . timolol (BETIMOL) 0.5 % ophthalmic solution Place 1 drop into both eyes daily.  . traZODone (DESYREL) 100 MG tablet Take 200 mg by mouth at bedtime.   . Wheat Dextrin (BENEFIBER) POWD Take 1 packet by mouth daily. Clear and Sugar FREE  . [DISCONTINUED] cefTRIAXone 2  g in sodium chloride 0.9 % 100 mL Inject 2 g into the vein daily.  . [DISCONTINUED] Vancomycin (VANCOCIN) 750-5 MG/150ML-% SOLN Inject 150 mLs (750 mg total) into the vein every 12 (twelve) hours.   No facility-administered encounter medications on file as of 09/30/2017.      Patient Active Problem List   Diagnosis Date Noted  . 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 Abdominal pain   . Nausea without vomiting   . Constipation   . Postoperative anemia due to acute blood loss 08/29/2017  . Low back pain 08/27/2017  . Urticaria 07/19/2017  . Acute  post-operative pain 07/13/2017  . Intractable low back pain 07/13/2017  . Radiculopathy 05/04/2014     Health Maintenance Due  Topic Date Due  . TETANUS/TDAP  02/25/1961  . COLONOSCOPY  02/26/1992  . DEXA SCAN  02/26/2007  . PNA vac Low Risk Adult (1 of 2 - PCV13) 02/26/2007    Social History   Tobacco Use  . Smoking status: Never Smoker  . Smokeless tobacco: Never Used  Substance Use Topics  . Alcohol use: No  . Drug use: No    Review of Systems +weakness from deconditioning, still having chronic back pain. Otherwise 12 point ros is negative. Physical Exam   BP 122/76   Pulse 63   Temp 97.7 F (36.5 C) (Oral)   Physical Exam  Constitutional:  oriented to person, place, and time. appears well-developed and well-nourished. No distress.  HENT: [place]/AT, PERRLA, no scleral icterus Mouth/Throat: Oropharynx is clear and moist. No oropharyngeal exudate.  Cardiovascular: Normal rate, regular rhythm and normal heart sounds. Exam reveals no gallop and no friction rub.  No murmur heard.  Pulmonary/Chest: Effort normal and breath sounds normal. No respiratory distress.  has no wheezes.  Neck = supple, no nuchal rigidity Abdominal: Soft. Bowel sounds are normal.  exhibits no distension. There is no tenderness.  Lymphadenopathy: no cervical adenopathy. No axillary adenopathy Neurological: alert and oriented to person, place, and time.  Skin: Skin is warm and dry. No rash noted. No erythema.  Psychiatric: a normal mood and affect.  behavior is normal.   CBC Lab Results  Component Value Date   WBC 9.6 08/31/2017   RBC 3.85 (L) 08/31/2017   HGB 11.1 (L) 08/31/2017   HCT 32.8 (L) 08/31/2017   PLT 276 08/31/2017   MCV 85.2 08/31/2017   MCH 28.8 08/31/2017   MCHC 33.8 08/31/2017   RDW 13.4 08/31/2017   LYMPHSABS 1.8 08/31/2017   MONOABS 1.0 08/31/2017   EOSABS 0.4 08/31/2017    BMET Lab Results  Component Value Date   NA 131 (L) 09/02/2017   K 3.3 (L) 09/02/2017   CL 95  (L) 09/02/2017   CO2 26 09/02/2017   GLUCOSE 122 (H) 09/02/2017   BUN <5 (L) 09/02/2017   CREATININE 0.90 09/02/2017   CALCIUM 8.8 (L) 09/02/2017   GFRNONAA >60 09/02/2017   GFRAA >60 09/02/2017   Lab Results  Component Value Date   ESRSEDRATE 50 (H) 09/30/2017   Lab Results  Component Value Date   CRP 5.0 09/30/2017    Assessment and Plan  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 will check sed rate and crp. Will plan to continue with 4 wk of oral abtx. Will pull picc line. Will send in rx for doxycycline 100mg  po bid on full stomach  Decondition = recommend to continue with working with PT/OT  Will have her return to clinic in 4 wk

## 2017-09-30 NOTE — Patient Outreach (Signed)
[facility]) Care Management  09/30/2017  Karen Castillo 04-22-1940 [id number]   1:15pm  Arrived for scheduled home visit. No answer at the door. Dogs barking inside.  Placed call to patient who answered and reports that she went to MD today and fell getting back into the house. Reports she is in the bed and can not come to the door for fear of falling. Reports she is very sore.   Reviewed pending primary MD appointment for tomorrow and patient is unsure if she will attend appointment.   Reports she has her medications and is taking them as prescribed. Reports she is active with home health.   PLAN: Transition of care call next week and home visit to be planned in 2 weeks. Reviewed fall precautions.   Tomasa Rand, RN, BSN, CEN [place] [employer] [phone]

## 2017-10-01 LAB — CBC WITH DIFFERENTIAL/PLATELET
Basophils Absolute: 59 cells/uL (ref 0–200)
Basophils Relative: 0.8 %
EOS PCT: 4.9 %
Eosinophils Absolute: 363 cells/uL (ref 15–500)
HCT: 33.2 % — ABNORMAL LOW (ref 35.0–45.0)
HEMOGLOBIN: 10.8 g/dL — AB (ref 11.7–15.5)
Lymphs Abs: 1598 cells/uL (ref 850–3900)
MCH: 29 pg (ref 27.0–33.0)
MCHC: 32.5 g/dL (ref 32.0–36.0)
MCV: 89 fL (ref 80.0–100.0)
MONOS PCT: 7.5 %
MPV: 9.8 fL (ref 7.5–12.5)
NEUTROS ABS: 4825 {cells}/uL (ref 1500–7800)
Neutrophils Relative %: 65.2 %
PLATELETS: 333 10*3/uL (ref 140–400)
RBC: 3.73 10*6/uL — AB (ref 3.80–5.10)
RDW: 13.8 % (ref 11.0–15.0)
TOTAL LYMPHOCYTE: 21.6 %
WBC mixed population: 555 cells/uL (ref 200–950)
WBC: 7.4 10*3/uL (ref 3.8–10.8)

## 2017-10-01 LAB — C-REACTIVE PROTEIN: CRP: 5 mg/L (ref <8.0)

## 2017-10-01 LAB — SEDIMENTATION RATE: Sed Rate: 50 mm/h — ABNORMAL HIGH (ref 0–30)

## 2017-10-02 MED ORDER — DOXYCYCLINE HYCLATE 100 MG PO TABS: 100.0000 mg | ORAL_TABLET | Freq: Two times a day (BID) | ORAL | 1 refills | Status: DC

## 2017-10-07 ENCOUNTER — Other Ambulatory Visit: Payer: Self-pay | Admitting: *Deleted

## 2017-10-07 ENCOUNTER — Ambulatory Visit: Payer: Self-pay

## 2017-10-07 NOTE — Patient Outreach (Signed)
[facility]) Care Management  10/07/2017  Karen Castillo 02-08-1941 [id number]  Transition of care call   Covering for assigned care coordinator    New referral on 09/21/2017 for transition of care. Initial successful outreach on 09/24/2017.  Patient reports recent back surgery and short term rehab stay at [facility].    0925 Unsuccessful outreach call to patient , able to leave a message for return call. Will plan return call later in day.  1226 Patient returned call, reports that she is feeling pretty good.  Patient discussed that home health OT just visited on today.  Patient reports that she is getting along alright with using walker in home, she denies having a fall. Patient discussed that she still has a rolling computer  chair in her bathroom but she keeps it against the wall to prevent it from sliding from under her, advised regarding fall precautions with sitting on rolling chair .  Patient discussed that she has a office visit with her back surgeon on Friday and she has transportation. Discussed follow up with PCP states she has not scheduled yet but she will get to it.  Discussed PCP visit patient states that she has not yet made appointment but she will.   Patient denies any new concerns at this time.   Plan Will update assigned care coordinator, reminded of her Tomasa Rand call her in the next week for continued transition of care follow up .  Reinforced fall prevention .   Joylene Draft, RN, [place] Management Coordinator  [phone]- Mobile [phone]- Toll Free Main Office

## 2017-10-09 DIAGNOSIS — M79672 Pain in left foot: Secondary | ICD-10-CM | POA: Diagnosis not present

## 2017-10-09 DIAGNOSIS — M79671 Pain in right foot: Secondary | ICD-10-CM | POA: Diagnosis not present

## 2017-10-13 ENCOUNTER — Other Ambulatory Visit: Payer: Self-pay

## 2017-10-13 DIAGNOSIS — I10 Essential (primary) hypertension: Secondary | ICD-10-CM | POA: Diagnosis not present

## 2017-10-13 DIAGNOSIS — D62 Acute posthemorrhagic anemia: Secondary | ICD-10-CM | POA: Diagnosis not present

## 2017-10-13 DIAGNOSIS — Z452 Encounter for adjustment and management of vascular access device: Secondary | ICD-10-CM | POA: Diagnosis not present

## 2017-10-13 DIAGNOSIS — T8142XD Infection following a procedure, deep incisional surgical site, subsequent encounter: Secondary | ICD-10-CM | POA: Diagnosis not present

## 2017-10-13 DIAGNOSIS — M1991 Primary osteoarthritis, unspecified site: Secondary | ICD-10-CM | POA: Diagnosis not present

## 2017-10-13 NOTE — Patient Outreach (Signed)
Transition of care:  Placed call to patient who reports she is doing well since her last fall. Reports that she is active with PT, OT and nursing with home health. Reports she saw her orthopedist last Friday and her back was okay.  Reviewed follow up with primary MD is planned for this week.   Patient reports that she does not need anything. Offered another home visit and patient reports that she is on longer interested in [facility]. Reports she has too many appointments and phone calls.  PLAN: will close case at patients request. Offered to mail a letter with my contact information and patient agreed. Will notify MD of case closure.  Tomasa Rand, RN, BSN, CEN [facility] [employer] [phone]

## 2017-10-16 DIAGNOSIS — K5900 Constipation, unspecified: Secondary | ICD-10-CM | POA: Diagnosis not present

## 2017-10-16 DIAGNOSIS — M869 Osteomyelitis, unspecified: Secondary | ICD-10-CM | POA: Diagnosis not present

## 2017-10-16 DIAGNOSIS — S32039A Unspecified fracture of third lumbar vertebra, initial encounter for closed fracture: Secondary | ICD-10-CM | POA: Diagnosis not present

## 2017-10-16 DIAGNOSIS — Z9189 Other specified personal risk factors, not elsewhere classified: Secondary | ICD-10-CM | POA: Diagnosis not present

## 2017-10-16 DIAGNOSIS — Z79899 Other long term (current) drug therapy: Secondary | ICD-10-CM | POA: Diagnosis not present

## 2017-10-23 DIAGNOSIS — N390 Urinary tract infection, site not specified: Secondary | ICD-10-CM | POA: Diagnosis not present

## 2017-10-30 ENCOUNTER — Encounter: Payer: Self-pay | Admitting: Gastroenterology

## 2017-11-03 DIAGNOSIS — M4326 Fusion of spine, lumbar region: Secondary | ICD-10-CM | POA: Diagnosis not present

## 2017-11-03 DIAGNOSIS — M129 Arthropathy, unspecified: Secondary | ICD-10-CM | POA: Diagnosis not present

## 2017-11-03 DIAGNOSIS — M4057 Lordosis, unspecified, lumbosacral region: Secondary | ICD-10-CM | POA: Diagnosis not present

## 2017-11-03 DIAGNOSIS — M5136 Other intervertebral disc degeneration, lumbar region: Secondary | ICD-10-CM | POA: Diagnosis not present

## 2017-11-03 DIAGNOSIS — Z79899 Other long term (current) drug therapy: Secondary | ICD-10-CM | POA: Diagnosis not present

## 2017-11-06 DIAGNOSIS — M5416 Radiculopathy, lumbar region: Secondary | ICD-10-CM | POA: Diagnosis not present

## 2017-11-11 ENCOUNTER — Ambulatory Visit: Payer: PPO | Admitting: Internal Medicine

## 2017-11-17 DIAGNOSIS — M129 Arthropathy, unspecified: Secondary | ICD-10-CM | POA: Diagnosis not present

## 2017-11-17 DIAGNOSIS — M4057 Lordosis, unspecified, lumbosacral region: Secondary | ICD-10-CM | POA: Diagnosis not present

## 2017-11-17 DIAGNOSIS — M5136 Other intervertebral disc degeneration, lumbar region: Secondary | ICD-10-CM | POA: Diagnosis not present

## 2017-11-18 DIAGNOSIS — Z136 Encounter for screening for cardiovascular disorders: Secondary | ICD-10-CM | POA: Diagnosis not present

## 2017-11-18 DIAGNOSIS — N390 Urinary tract infection, site not specified: Secondary | ICD-10-CM | POA: Diagnosis not present

## 2017-11-18 DIAGNOSIS — I1310 Hypertensive heart and chronic kidney disease without heart failure, with stage 1 through stage 4 chronic kidney disease, or unspecified chronic kidney disease: Secondary | ICD-10-CM | POA: Diagnosis not present

## 2017-11-18 DIAGNOSIS — E669 Obesity, unspecified: Secondary | ICD-10-CM | POA: Diagnosis not present

## 2017-11-18 DIAGNOSIS — Z6837 Body mass index (BMI) 37.0-37.9, adult: Secondary | ICD-10-CM | POA: Diagnosis not present

## 2017-11-18 DIAGNOSIS — E785 Hyperlipidemia, unspecified: Secondary | ICD-10-CM | POA: Diagnosis not present

## 2017-11-18 DIAGNOSIS — Z9181 History of falling: Secondary | ICD-10-CM | POA: Diagnosis not present

## 2017-11-18 DIAGNOSIS — F3342 Major depressive disorder, recurrent, in full remission: Secondary | ICD-10-CM | POA: Diagnosis not present

## 2017-11-18 DIAGNOSIS — Z1339 Encounter for screening examination for other mental health and behavioral disorders: Secondary | ICD-10-CM | POA: Diagnosis not present

## 2017-11-18 DIAGNOSIS — R739 Hyperglycemia, unspecified: Secondary | ICD-10-CM | POA: Diagnosis not present

## 2017-11-18 DIAGNOSIS — K219 Gastro-esophageal reflux disease without esophagitis: Secondary | ICD-10-CM | POA: Diagnosis not present

## 2017-11-18 DIAGNOSIS — Z0000 Encounter for general adult medical examination without abnormal findings: Secondary | ICD-10-CM | POA: Diagnosis not present

## 2017-11-24 DIAGNOSIS — Z79899 Other long term (current) drug therapy: Secondary | ICD-10-CM | POA: Diagnosis not present

## 2017-11-24 DIAGNOSIS — M5136 Other intervertebral disc degeneration, lumbar region: Secondary | ICD-10-CM | POA: Diagnosis not present

## 2017-11-24 DIAGNOSIS — M545 Low back pain: Secondary | ICD-10-CM | POA: Diagnosis not present

## 2017-11-24 DIAGNOSIS — M961 Postlaminectomy syndrome, not elsewhere classified: Secondary | ICD-10-CM | POA: Diagnosis not present

## 2017-11-25 ENCOUNTER — Ambulatory Visit: Payer: PPO | Admitting: Gastroenterology

## 2017-11-26 ENCOUNTER — Encounter: Payer: Self-pay | Admitting: Gastroenterology

## 2017-11-27 ENCOUNTER — Ambulatory Visit (INDEPENDENT_AMBULATORY_CARE_PROVIDER_SITE_OTHER): Payer: PPO | Admitting: Gastroenterology

## 2017-11-27 ENCOUNTER — Encounter: Payer: Self-pay | Admitting: Gastroenterology

## 2017-11-27 VITALS — BP 136/80 | HR 73 | Ht 64.0 in | Wt 217.4 lb

## 2017-11-27 DIAGNOSIS — R1084 Generalized abdominal pain: Secondary | ICD-10-CM

## 2017-11-27 DIAGNOSIS — K5900 Constipation, unspecified: Secondary | ICD-10-CM

## 2017-11-27 MED ORDER — LINACLOTIDE 290 MCG PO CAPS: 290.0000 ug | ORAL_CAPSULE | Freq: Every day | ORAL | 11 refills | Status: AC

## 2017-11-27 NOTE — Patient Instructions (Signed)
If you are age 76 or older, your body mass index should be between 23-30. Your Body mass index is 37.31 kg/m. If this is out of the aforementioned range listed, please consider follow up with your Primary Care Provider.  If you are age 45 or younger, your body mass index should be between 19-25. Your Body mass index is 37.31 kg/m. If this is out of the aformentioned range listed, please consider follow up with your Primary Care Provider.   We have sent the following medications to your pharmacy for you to pick up at your convenience: Linzess 290 mcg once daily.   You will be due for a recall colonoscopy in 2021. We will send you a reminder in the mail when it gets closer to that time.   Thank you,  Dr. Jackquline Denmark

## 2017-11-27 NOTE — Progress Notes (Signed)
Chief Complaint:   Referring Provider:  Nicoletta Dress, MD      ASSESSMENT AND PLAN;   #1.  IBS with predominant constipation (element of medications induced constipation including narcotics)  #2.  Chronic abdominal pain, negative CT scan 08/2017 except for constipation and rectal impaction s/p manual disimpaction. Plan: - Continue benefiber, miralax 17g bid - Linzess 265mcg - Continue prunes - Next colon due 2021. - Increase water intake and start walking once back brace is off. - She will talk to Ihor Dow PA regarding amolidipine (causing severe constipation) and if it can be switched to any other antihypertensive medications. - Follow-up in 1 year.  Earlier, if with any new problems.   HPI:    Karen Castillo is a 77 y.o. female  S/p back Sx x 2 on dilaudid and Robaxin Has a back brace Chronic constipation In June had severe constipation associated with abdominal pain, underwent CT scan of the abdomen and pelvis which showed significant constipation and rectal impaction, requiring manual disimpaction. 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 Allergic rhinitis   . Anemia   . Arthritis   . Bipolar disorder ([place])    patient denies  . Cancer (HCC)    SKIN    ,    LEFT BREAST   . Chronic constipation   . Chronic pain syndrome   . Dry eyes   . Dysuria   . Elevated cholesterol   . Gallstones   . GERD (gastroesophageal reflux disease)   . Glaucoma    both eyes  . Gout   . Hypertension   . IBS (irritable bowel syndrome)   . Insomnia   . Obesity   . Schizophrenia ([place])   . Sleep apnea    USES CPAP   . Urticaria   . Vitamin  deficiency     Past Surgical History:  Procedure Laterality Date  . ABDOMINAL EXPOSURE N/A 05/04/2014   Procedure: ABDOMINAL EXPOSURE;  Surgeon: Serafina Mitchell, MD;  Location: [place];  Service: Vascular;  Laterality: N/A;  . ANTERIOR LUMBAR FUSION N/A 05/04/2014   Procedure: ANTERIOR LUMBAR FUSION 1 LEVEL;  Surgeon: Sinclair Ship, MD;  Location: [place];  Service: Orthopedics;  Laterality: N/A;  Lumbar 5-sacrum 1 anterior lumbar interbody fusion with instrumentation and allograft  . BACK SURGERY     2010 ,2015LUMB FUSION, 06/2013 [facility] FUSION  . BACK SURGERY  07/2017   Lumbar  . BREAST SURGERY     LEFT BRREAST BX  . CARPAL TUNNEL RELEASE     1994 RT  . CARPAL TUNNEL RELEASE Bilateral   . CHOLECYSTECTOMY     2004  . COLONOSCOPY W/ BIOPSIES    . ERCP     2009  . ESOPHAGOGASTRODUODENOSCOPY  08/09/2017   Small hiatal hernia. Mild gastritis. Incidenta; duodenal diverticulum. Incidental gastric polyp.   Marland Kitchen LEEP     [id number]  . SKIN CANCER EXCISION     X 4   . TUBAL LIGATION      Family History  Problem Relation Age of Onset  . Diabetes Mother        had a history but not anymore  . Prostate cancer  Father   . Stroke Sister        has a brain tumor  . Colon cancer Neg Hx   . Esophageal cancer Neg Hx     Social History   Tobacco Use  . Smoking status: Never Smoker  . Smokeless tobacco: Never Used  Substance Use Topics  . Alcohol use: No  . Drug use: No    Current Outpatient Medications  Medication Sig Dispense Refill  . allopurinol (ZYLOPRIM) 100 MG tablet Take 100 mg by mouth daily.    Marland Kitchen amLODipine (NORVASC) 10 MG tablet Take 10 mg by mouth daily.    Marland Kitchen aspirin EC 81 MG tablet Take 81 mg by mouth daily.    Marland Kitchen atenolol (TENORMIN) 100 MG tablet Take 100 mg by mouth at bedtime.     Marland Kitchen azelastine (ASTELIN) 0.1 % nasal spray Place 1-2 sprays into both nostrils 2 (two) times daily as needed for rhinitis or allergies.     . Calcium Carb-Cholecalciferol (CALCIUM+D3 PO)  Take 1 tablet by mouth daily.     . Cholecalciferol (VITAMIN D3) 2000 units TABS Take 2,000 Units by mouth daily.    . cloNIDine (CATAPRES) 0.1 MG tablet Take 0.1 mg by mouth at bedtime.    . cycloSPORINE (RESTASIS) 0.05 % ophthalmic emulsion Place 1 drop into both eyes 4 (four) times daily.    . diazepam (VALIUM) 5 MG tablet Take 1 tablet (5 mg total) by mouth every 6 (six) hours as needed for muscle spasms (for spasms). 40 tablet 0  . doxycycline (VIBRA-TABS) 100 MG tablet Take 1 tablet (100 mg total) by mouth 2 (two) times daily. 60 tablet 1  . lansoprazole (PREVACID) 30 MG capsule Take 30 mg by mouth daily before breakfast. 30 minutes before breakfast    . latanoprost (XALATAN) 0.005 % ophthalmic solution Place 1 drop into both eyes at bedtime.    Marland Kitchen losartan-hydrochlorothiazide (HYZAAR) 100-25 MG tablet Take 1 tablet by mouth daily.    . Melatonin 10 MG TABS Take 10 mg by mouth at bedtime. (2100)    . methocarbamol (ROBAXIN) 500 MG tablet Take 1 tablet (500 mg total) by mouth every 6 (six) hours as needed for muscle spasms (in between valium). 60 tablet 1  . nortriptyline (PAMELOR) 10 MG capsule Take 10 mg by mouth at bedtime.     . ondansetron (ZOFRAN) 4 MG/2ML SOLN injection Inject 2 mLs (4 mg total) into the vein every 6 (six) hours as needed for nausea or vomiting. 2 mL 0  . polyethylene glycol (MIRALAX / GLYCOLAX) packet Take 17 g by mouth daily. 14 each 0  . promethazine (PHENERGAN) 12.5 MG tablet Take 12.5 mg by mouth 2 (two) times daily as needed for nausea or vomiting.   0  . senna-docusate (SENOKOT-S) 8.6-50 MG tablet Take 2 tablets by mouth 2 (two) times daily. 30 tablet 0  . spironolactone (ALDACTONE) 25 MG tablet Take 25 mg by mouth daily.    . timolol (BETIMOL) 0.5 % ophthalmic solution Place 1 drop into both eyes daily.    . traZODone (DESYREL) 100 MG tablet Take 200 mg by mouth at bedtime.     . Wheat Dextrin (BENEFIBER) POWD Take 1 packet by mouth daily. Clear and Sugar FREE      No current facility-administered medications for this visit.     Allergies  Allergen Reactions  . Ciprofloxacin Swelling    Tongue swells  . Cyclobenzaprine Swelling    Feet swell   . Pregabalin  Anaphylaxis, Shortness Of Breath, Swelling and Other (See Comments)    Dizziness also  . Fluvastatin Other (See Comments)    Leg pain   . Meloxicam Swelling    Feet became swollen  . Metaxalone Rash and Other (See Comments)    Flushing of the skin and sore throat, too  . Pramipexole Nausea And Vomiting  . Pravastatin Other (See Comments)    Leg pain  . Rosuvastatin Other (See Comments)    Leg pain  . Sertraline Other (See Comments)    Sensitive teeth  . Statins Other (See Comments)    Leg pain     . Amitriptyline Other (See Comments)    Weird feeling all over  . Arthrotec [Diclofenac-Misoprostol] Diarrhea  . Augmentin [Amoxicillin-Pot Clavulanate] Diarrhea    Has patient had a PCN reaction causing immediate rash, facial/tongue/throat swelling, SOB or lightheadedness with hypotension: No Has patient had a PCN reaction causing severe rash involving mucus membranes or skin necrosis: No Has patient had a PCN reaction that required hospitalization: No Has patient had a PCN reaction occurring within the last 10 years: No If all of the above answers are "NO", then may proceed with Cephalosporin use.   . Bromfenac Diarrhea and Nausea Only  . Cephalexin Diarrhea and Nausea Only  . Conj Estrog-Medroxyprogest Ace Other (See Comments)    Unsure of reaction type Couldn't tolerate  . Diclofenac-Misoprostol Nausea Only and Diarrhea  . Erythromycin Diarrhea and Nausea Only  . Esomeprazole Diarrhea  . Esomeprazole Magnesium Nausea Only  . Levofloxacin Other (See Comments) and Nausea Only    Stomach upset  . Methadone Diarrhea  . Metoclopramide Diarrhea and Nausea Only  . Oxycodone Hives, Swelling and Rash  . Propoxyphene Diarrhea and Nausea Only  . Rofecoxib Diarrhea and Nausea Only    . Sulfur Diarrhea and Nausea Only  . Tramadol Diarrhea  . Zoloft [Sertraline Hcl] Other (See Comments)    Sensitivity with teeth    Review of Systems:  Constitutional: Denies fever, chills, diaphoresis, appetite change and fatigue.  HEENT: Denies photophobia, eye pain, redness, hearing loss, ear pain, congestion, sore throat, rhinorrhea, sneezing, mouth sores, neck pain, neck stiffness and tinnitus.   Respiratory: Denies SOB, DOE, cough, chest tightness,  and wheezing.   Cardiovascular: Denies chest pain, palpitations and leg swelling.  Genitourinary: Denies dysuria, urgency, frequency, hematuria, flank pain and difficulty urinating.  Musculoskeletal: Denies myalgias, back pain, joint swelling, arthralgias and gait problem.  Skin: No rash.  Neurological: Denies dizziness, seizures, syncope, weakness, light-headedness, numbness and headaches.  Hematological: Denies adenopathy. Easy bruising, personal or family bleeding history  Psychiatric/Behavioral: No anxiety or depression     Physical Exam:    BP 136/80   Pulse 73   Ht 5\' 4"  (1.626 m)   Wt 217 lb 6 oz (98.6 kg)   BMI 37.31 kg/m  Filed Weights   11/27/17 1445  Weight: 217 lb 6 oz (98.6 kg)   Constitutional:  Well-developed, in no acute distress. Psychiatric: Normal mood and affect. Behavior is normal. HEENT: Pupils normal.  Conjunctivae are normal. No scleral icterus. Neck supple.  Cardiovascular: Normal rate, regular rhythm. No edema Pulmonary/chest: Effort normal and breath sounds normal. No wheezing, rales or rhonchi. Abdominal: Soft, nondistended. Nontender. Bowel sounds active throughout. There are no masses palpable. No hepatomegaly. Rectal:  defered Neurological: Alert and oriented to person place and time. Skin: Skin is warm and dry. No rashes noted.  Data Reviewed: I have personally reviewed following labs and imaging studies  CBC: CBC Latest Ref Rng & Units 09/30/2017 08/31/2017 08/30/2017  WBC 3.8 - 10.8  Thousand/uL 7.4 9.6 8.6  Hemoglobin 11.7 - 15.5 g/dL 10.8(L) 11.1(L) 10.7(L)  Hematocrit 35.0 - 45.0 % 33.2(L) 32.8(L) 32.4(L)  Platelets 140 - 400 Thousand/uL 333 276 235    CMP: CMP Latest Ref Rng & Units 09/02/2017 09/01/2017 08/31/2017  Glucose 70 - 99 mg/dL 122(H) 136(H) 104(H)  BUN 8 - 23 mg/dL <5(L) <5(L) 5(L)  Creatinine 0.44 - 1.00 mg/dL 0.90 1.07(H) 0.92  Sodium 135 - 145 mmol/L 131(L) 130(L) 128(L)  Potassium 3.5 - 5.1 mmol/L 3.3(L) 3.3(L) 2.9(L)  Chloride 98 - 111 mmol/L 95(L) 91(L) 90(L)  CO2 22 - 32 mmol/L 26 24 27   Calcium 8.9 - 10.3 mg/dL 8.8(L) 9.2 9.3  Total Protein 6.5 - 8.1 g/dL - - 5.8(L)  Total Bilirubin 0.3 - 1.2 mg/dL - - 1.2  Alkaline Phos 38 - 126 U/L - - 95  AST 15 - 41 U/L - - 19  ALT 14 - 54 U/L - - 14     Carmell Austria, MD 11/27/2017, 3:00 PM  Cc: Nicoletta Dress, MD

## 2017-12-01 DIAGNOSIS — N3946 Mixed incontinence: Secondary | ICD-10-CM | POA: Diagnosis not present

## 2017-12-01 DIAGNOSIS — N952 Postmenopausal atrophic vaginitis: Secondary | ICD-10-CM | POA: Diagnosis not present

## 2017-12-01 DIAGNOSIS — N390 Urinary tract infection, site not specified: Secondary | ICD-10-CM | POA: Diagnosis not present

## 2017-12-01 DIAGNOSIS — Z79899 Other long term (current) drug therapy: Secondary | ICD-10-CM | POA: Diagnosis not present

## 2017-12-04 DIAGNOSIS — M545 Low back pain: Secondary | ICD-10-CM | POA: Diagnosis not present

## 2017-12-07 DIAGNOSIS — H04123 Dry eye syndrome of bilateral lacrimal glands: Secondary | ICD-10-CM | POA: Diagnosis not present

## 2017-12-14 DIAGNOSIS — M5136 Other intervertebral disc degeneration, lumbar region: Secondary | ICD-10-CM | POA: Diagnosis not present

## 2017-12-14 DIAGNOSIS — M961 Postlaminectomy syndrome, not elsewhere classified: Secondary | ICD-10-CM | POA: Diagnosis not present

## 2017-12-14 DIAGNOSIS — M545 Low back pain: Secondary | ICD-10-CM | POA: Diagnosis not present

## 2017-12-16 DIAGNOSIS — M4326 Fusion of spine, lumbar region: Secondary | ICD-10-CM | POA: Diagnosis not present

## 2017-12-16 DIAGNOSIS — M545 Low back pain: Secondary | ICD-10-CM | POA: Diagnosis not present

## 2017-12-18 DIAGNOSIS — M4326 Fusion of spine, lumbar region: Secondary | ICD-10-CM | POA: Diagnosis not present

## 2017-12-18 DIAGNOSIS — M545 Low back pain: Secondary | ICD-10-CM | POA: Diagnosis not present

## 2017-12-21 DIAGNOSIS — M4326 Fusion of spine, lumbar region: Secondary | ICD-10-CM | POA: Diagnosis not present

## 2017-12-21 DIAGNOSIS — M545 Low back pain: Secondary | ICD-10-CM | POA: Diagnosis not present

## 2017-12-22 DIAGNOSIS — M5136 Other intervertebral disc degeneration, lumbar region: Secondary | ICD-10-CM | POA: Diagnosis not present

## 2017-12-22 DIAGNOSIS — Z23 Encounter for immunization: Secondary | ICD-10-CM | POA: Diagnosis not present

## 2017-12-22 DIAGNOSIS — M545 Low back pain: Secondary | ICD-10-CM | POA: Diagnosis not present

## 2017-12-22 DIAGNOSIS — M961 Postlaminectomy syndrome, not elsewhere classified: Secondary | ICD-10-CM | POA: Diagnosis not present

## 2017-12-22 DIAGNOSIS — G4700 Insomnia, unspecified: Secondary | ICD-10-CM | POA: Diagnosis not present

## 2017-12-22 DIAGNOSIS — Z79899 Other long term (current) drug therapy: Secondary | ICD-10-CM | POA: Diagnosis not present

## 2017-12-28 DIAGNOSIS — M545 Low back pain: Secondary | ICD-10-CM | POA: Diagnosis not present

## 2017-12-28 DIAGNOSIS — M4326 Fusion of spine, lumbar region: Secondary | ICD-10-CM | POA: Diagnosis not present

## 2017-12-29 DIAGNOSIS — N390 Urinary tract infection, site not specified: Secondary | ICD-10-CM | POA: Diagnosis not present

## 2017-12-29 DIAGNOSIS — N952 Postmenopausal atrophic vaginitis: Secondary | ICD-10-CM | POA: Diagnosis not present

## 2017-12-29 DIAGNOSIS — N3946 Mixed incontinence: Secondary | ICD-10-CM | POA: Diagnosis not present

## 2017-12-31 DIAGNOSIS — M545 Low back pain: Secondary | ICD-10-CM | POA: Diagnosis not present

## 2017-12-31 DIAGNOSIS — M4326 Fusion of spine, lumbar region: Secondary | ICD-10-CM | POA: Diagnosis not present

## 2018-01-07 DIAGNOSIS — M4326 Fusion of spine, lumbar region: Secondary | ICD-10-CM | POA: Diagnosis not present

## 2018-01-07 DIAGNOSIS — M545 Low back pain: Secondary | ICD-10-CM | POA: Diagnosis not present

## 2018-01-11 DIAGNOSIS — M545 Low back pain: Secondary | ICD-10-CM | POA: Diagnosis not present

## 2018-01-11 DIAGNOSIS — M4326 Fusion of spine, lumbar region: Secondary | ICD-10-CM | POA: Diagnosis not present

## 2018-01-14 DIAGNOSIS — M545 Low back pain: Secondary | ICD-10-CM | POA: Diagnosis not present

## 2018-01-14 DIAGNOSIS — M4326 Fusion of spine, lumbar region: Secondary | ICD-10-CM | POA: Diagnosis not present

## 2018-01-15 DIAGNOSIS — M545 Low back pain: Secondary | ICD-10-CM | POA: Diagnosis not present

## 2018-01-18 DIAGNOSIS — M4326 Fusion of spine, lumbar region: Secondary | ICD-10-CM | POA: Diagnosis not present

## 2018-01-18 DIAGNOSIS — M545 Low back pain: Secondary | ICD-10-CM | POA: Diagnosis not present

## 2018-01-19 DIAGNOSIS — M5136 Other intervertebral disc degeneration, lumbar region: Secondary | ICD-10-CM | POA: Diagnosis not present

## 2018-01-19 DIAGNOSIS — M545 Low back pain: Secondary | ICD-10-CM | POA: Diagnosis not present

## 2018-01-19 DIAGNOSIS — M961 Postlaminectomy syndrome, not elsewhere classified: Secondary | ICD-10-CM | POA: Diagnosis not present

## 2018-01-19 DIAGNOSIS — Z79899 Other long term (current) drug therapy: Secondary | ICD-10-CM | POA: Diagnosis not present

## 2018-01-20 DIAGNOSIS — M8589 Other specified disorders of bone density and structure, multiple sites: Secondary | ICD-10-CM | POA: Diagnosis not present

## 2018-01-20 DIAGNOSIS — Z1231 Encounter for screening mammogram for malignant neoplasm of breast: Secondary | ICD-10-CM | POA: Diagnosis not present

## 2018-01-22 DIAGNOSIS — M5489 Other dorsalgia: Secondary | ICD-10-CM | POA: Diagnosis not present

## 2018-01-22 DIAGNOSIS — G8929 Other chronic pain: Secondary | ICD-10-CM | POA: Diagnosis not present

## 2018-01-22 DIAGNOSIS — I318 Other specified diseases of pericardium: Secondary | ICD-10-CM | POA: Diagnosis not present

## 2018-01-22 DIAGNOSIS — Z853 Personal history of malignant neoplasm of breast: Secondary | ICD-10-CM | POA: Diagnosis not present

## 2018-01-22 DIAGNOSIS — M8580 Other specified disorders of bone density and structure, unspecified site: Secondary | ICD-10-CM | POA: Diagnosis not present

## 2018-01-22 DIAGNOSIS — Z79899 Other long term (current) drug therapy: Secondary | ICD-10-CM | POA: Diagnosis not present

## 2018-01-22 DIAGNOSIS — M8589 Other specified disorders of bone density and structure, multiple sites: Secondary | ICD-10-CM | POA: Diagnosis not present

## 2018-01-25 DIAGNOSIS — M545 Low back pain: Secondary | ICD-10-CM | POA: Diagnosis not present

## 2018-01-25 DIAGNOSIS — M4326 Fusion of spine, lumbar region: Secondary | ICD-10-CM | POA: Diagnosis not present

## 2018-02-09 DIAGNOSIS — M545 Low back pain: Secondary | ICD-10-CM | POA: Diagnosis not present

## 2018-02-11 DIAGNOSIS — L570 Actinic keratosis: Secondary | ICD-10-CM | POA: Diagnosis not present

## 2018-02-11 DIAGNOSIS — L814 Other melanin hyperpigmentation: Secondary | ICD-10-CM | POA: Diagnosis not present

## 2018-02-11 DIAGNOSIS — L821 Other seborrheic keratosis: Secondary | ICD-10-CM | POA: Diagnosis not present

## 2018-02-11 DIAGNOSIS — M4326 Fusion of spine, lumbar region: Secondary | ICD-10-CM | POA: Diagnosis not present

## 2018-02-11 DIAGNOSIS — D225 Melanocytic nevi of trunk: Secondary | ICD-10-CM | POA: Diagnosis not present

## 2018-02-11 DIAGNOSIS — M545 Low back pain: Secondary | ICD-10-CM | POA: Diagnosis not present

## 2018-02-11 DIAGNOSIS — D1801 Hemangioma of skin and subcutaneous tissue: Secondary | ICD-10-CM | POA: Diagnosis not present

## 2018-02-15 DIAGNOSIS — M545 Low back pain: Secondary | ICD-10-CM | POA: Diagnosis not present

## 2018-02-15 DIAGNOSIS — M4326 Fusion of spine, lumbar region: Secondary | ICD-10-CM | POA: Diagnosis not present

## 2018-02-16 DIAGNOSIS — M25531 Pain in right wrist: Secondary | ICD-10-CM | POA: Diagnosis not present

## 2018-02-16 DIAGNOSIS — M545 Low back pain: Secondary | ICD-10-CM | POA: Diagnosis not present

## 2018-02-16 DIAGNOSIS — Z79899 Other long term (current) drug therapy: Secondary | ICD-10-CM | POA: Diagnosis not present

## 2018-02-16 DIAGNOSIS — M5136 Other intervertebral disc degeneration, lumbar region: Secondary | ICD-10-CM | POA: Diagnosis not present

## 2018-02-18 DIAGNOSIS — M4326 Fusion of spine, lumbar region: Secondary | ICD-10-CM | POA: Diagnosis not present

## 2018-02-18 DIAGNOSIS — M545 Low back pain: Secondary | ICD-10-CM | POA: Diagnosis not present

## 2018-02-22 DIAGNOSIS — M545 Low back pain: Secondary | ICD-10-CM | POA: Diagnosis not present

## 2018-02-22 DIAGNOSIS — M4326 Fusion of spine, lumbar region: Secondary | ICD-10-CM | POA: Diagnosis not present

## 2018-03-01 DIAGNOSIS — M545 Low back pain: Secondary | ICD-10-CM | POA: Diagnosis not present

## 2018-03-01 DIAGNOSIS — M4326 Fusion of spine, lumbar region: Secondary | ICD-10-CM | POA: Diagnosis not present

## 2018-03-04 DIAGNOSIS — M4326 Fusion of spine, lumbar region: Secondary | ICD-10-CM | POA: Diagnosis not present

## 2018-03-04 DIAGNOSIS — M545 Low back pain: Secondary | ICD-10-CM | POA: Diagnosis not present

## 2018-03-12 DIAGNOSIS — M545 Low back pain: Secondary | ICD-10-CM | POA: Diagnosis not present

## 2018-03-18 DIAGNOSIS — G4700 Insomnia, unspecified: Secondary | ICD-10-CM | POA: Diagnosis not present

## 2018-03-18 DIAGNOSIS — M545 Low back pain: Secondary | ICD-10-CM | POA: Diagnosis not present

## 2018-03-18 DIAGNOSIS — Z79899 Other long term (current) drug therapy: Secondary | ICD-10-CM | POA: Diagnosis not present

## 2018-03-18 DIAGNOSIS — M5136 Other intervertebral disc degeneration, lumbar region: Secondary | ICD-10-CM | POA: Diagnosis not present

## 2018-03-18 DIAGNOSIS — M961 Postlaminectomy syndrome, not elsewhere classified: Secondary | ICD-10-CM | POA: Diagnosis not present

## 2018-03-30 DIAGNOSIS — H04123 Dry eye syndrome of bilateral lacrimal glands: Secondary | ICD-10-CM | POA: Diagnosis not present

## 2018-03-30 DIAGNOSIS — H401113 Primary open-angle glaucoma, right eye, severe stage: Secondary | ICD-10-CM | POA: Diagnosis not present

## 2018-03-31 DIAGNOSIS — N3946 Mixed incontinence: Secondary | ICD-10-CM | POA: Diagnosis not present

## 2018-03-31 DIAGNOSIS — N390 Urinary tract infection, site not specified: Secondary | ICD-10-CM | POA: Diagnosis not present

## 2018-03-31 DIAGNOSIS — N952 Postmenopausal atrophic vaginitis: Secondary | ICD-10-CM | POA: Diagnosis not present

## 2018-04-15 DIAGNOSIS — M961 Postlaminectomy syndrome, not elsewhere classified: Secondary | ICD-10-CM | POA: Diagnosis not present

## 2018-04-15 DIAGNOSIS — E559 Vitamin D deficiency, unspecified: Secondary | ICD-10-CM | POA: Diagnosis not present

## 2018-04-15 DIAGNOSIS — Z79899 Other long term (current) drug therapy: Secondary | ICD-10-CM | POA: Diagnosis not present

## 2018-04-15 DIAGNOSIS — R5383 Other fatigue: Secondary | ICD-10-CM | POA: Diagnosis not present

## 2018-04-15 DIAGNOSIS — E7800 Pure hypercholesterolemia, unspecified: Secondary | ICD-10-CM | POA: Diagnosis not present

## 2018-04-15 DIAGNOSIS — M5136 Other intervertebral disc degeneration, lumbar region: Secondary | ICD-10-CM | POA: Diagnosis not present

## 2018-04-15 DIAGNOSIS — M129 Arthropathy, unspecified: Secondary | ICD-10-CM | POA: Diagnosis not present

## 2018-04-15 DIAGNOSIS — M545 Low back pain: Secondary | ICD-10-CM | POA: Diagnosis not present

## 2018-04-15 DIAGNOSIS — D539 Nutritional anemia, unspecified: Secondary | ICD-10-CM | POA: Diagnosis not present

## 2018-04-15 DIAGNOSIS — R300 Dysuria: Secondary | ICD-10-CM | POA: Diagnosis not present

## 2018-04-15 DIAGNOSIS — M62830 Muscle spasm of back: Secondary | ICD-10-CM | POA: Diagnosis not present

## 2018-04-20 DIAGNOSIS — H04123 Dry eye syndrome of bilateral lacrimal glands: Secondary | ICD-10-CM | POA: Diagnosis not present

## 2018-05-13 DIAGNOSIS — Z79899 Other long term (current) drug therapy: Secondary | ICD-10-CM | POA: Diagnosis not present

## 2018-05-13 DIAGNOSIS — M545 Low back pain: Secondary | ICD-10-CM | POA: Diagnosis not present

## 2018-05-13 DIAGNOSIS — G4700 Insomnia, unspecified: Secondary | ICD-10-CM | POA: Diagnosis not present

## 2018-05-13 DIAGNOSIS — M5136 Other intervertebral disc degeneration, lumbar region: Secondary | ICD-10-CM | POA: Diagnosis not present

## 2018-05-25 DIAGNOSIS — I1310 Hypertensive heart and chronic kidney disease without heart failure, with stage 1 through stage 4 chronic kidney disease, or unspecified chronic kidney disease: Secondary | ICD-10-CM | POA: Diagnosis not present

## 2018-05-25 DIAGNOSIS — E785 Hyperlipidemia, unspecified: Secondary | ICD-10-CM | POA: Diagnosis not present

## 2018-05-25 DIAGNOSIS — R739 Hyperglycemia, unspecified: Secondary | ICD-10-CM | POA: Diagnosis not present

## 2018-05-25 DIAGNOSIS — K5900 Constipation, unspecified: Secondary | ICD-10-CM | POA: Diagnosis not present

## 2018-05-25 DIAGNOSIS — K219 Gastro-esophageal reflux disease without esophagitis: Secondary | ICD-10-CM | POA: Diagnosis not present

## 2018-06-10 DIAGNOSIS — E785 Hyperlipidemia, unspecified: Secondary | ICD-10-CM | POA: Diagnosis not present

## 2018-06-10 DIAGNOSIS — K5730 Diverticulosis of large intestine without perforation or abscess without bleeding: Secondary | ICD-10-CM | POA: Diagnosis not present

## 2018-06-10 DIAGNOSIS — Z7952 Long term (current) use of systemic steroids: Secondary | ICD-10-CM | POA: Diagnosis not present

## 2018-06-10 DIAGNOSIS — M87151 Osteonecrosis due to drugs, right femur: Secondary | ICD-10-CM | POA: Diagnosis not present

## 2018-06-10 DIAGNOSIS — M8580 Other specified disorders of bone density and structure, unspecified site: Secondary | ICD-10-CM | POA: Diagnosis not present

## 2018-06-10 DIAGNOSIS — Z7982 Long term (current) use of aspirin: Secondary | ICD-10-CM | POA: Diagnosis not present

## 2018-06-10 DIAGNOSIS — M545 Low back pain: Secondary | ICD-10-CM | POA: Diagnosis not present

## 2018-06-10 DIAGNOSIS — M5136 Other intervertebral disc degeneration, lumbar region: Secondary | ICD-10-CM | POA: Diagnosis not present

## 2018-06-10 DIAGNOSIS — G4700 Insomnia, unspecified: Secondary | ICD-10-CM | POA: Diagnosis not present

## 2018-06-10 DIAGNOSIS — Z87891 Personal history of nicotine dependence: Secondary | ICD-10-CM | POA: Diagnosis not present

## 2018-06-10 DIAGNOSIS — M353 Polymyalgia rheumatica: Secondary | ICD-10-CM | POA: Diagnosis not present

## 2018-06-10 DIAGNOSIS — Z8261 Family history of arthritis: Secondary | ICD-10-CM | POA: Diagnosis not present

## 2018-06-10 DIAGNOSIS — Z8249 Family history of ischemic heart disease and other diseases of the circulatory system: Secondary | ICD-10-CM | POA: Diagnosis not present

## 2018-06-10 DIAGNOSIS — Z524 Kidney donor: Secondary | ICD-10-CM | POA: Diagnosis not present

## 2018-06-10 DIAGNOSIS — Z800 Family history of malignant neoplasm of digestive organs: Secondary | ICD-10-CM | POA: Diagnosis not present

## 2018-06-10 DIAGNOSIS — Z79890 Hormone replacement therapy (postmenopausal): Secondary | ICD-10-CM | POA: Diagnosis not present

## 2018-06-10 DIAGNOSIS — F1190 Opioid use, unspecified, uncomplicated: Secondary | ICD-10-CM | POA: Diagnosis not present

## 2018-06-10 DIAGNOSIS — T380X5A Adverse effect of glucocorticoids and synthetic analogues, initial encounter: Secondary | ICD-10-CM | POA: Diagnosis not present

## 2018-06-10 DIAGNOSIS — E039 Hypothyroidism, unspecified: Secondary | ICD-10-CM | POA: Diagnosis not present

## 2018-06-10 DIAGNOSIS — I10 Essential (primary) hypertension: Secondary | ICD-10-CM | POA: Diagnosis not present

## 2018-06-30 DIAGNOSIS — N3946 Mixed incontinence: Secondary | ICD-10-CM | POA: Diagnosis not present

## 2018-06-30 DIAGNOSIS — N952 Postmenopausal atrophic vaginitis: Secondary | ICD-10-CM | POA: Diagnosis not present

## 2018-06-30 DIAGNOSIS — N390 Urinary tract infection, site not specified: Secondary | ICD-10-CM | POA: Diagnosis not present

## 2018-07-09 DIAGNOSIS — M62830 Muscle spasm of back: Secondary | ICD-10-CM | POA: Diagnosis not present

## 2018-07-09 DIAGNOSIS — M545 Low back pain: Secondary | ICD-10-CM | POA: Diagnosis not present

## 2018-07-09 DIAGNOSIS — M5136 Other intervertebral disc degeneration, lumbar region: Secondary | ICD-10-CM | POA: Diagnosis not present

## 2018-07-09 DIAGNOSIS — G4700 Insomnia, unspecified: Secondary | ICD-10-CM | POA: Diagnosis not present

## 2018-08-09 DIAGNOSIS — Z79899 Other long term (current) drug therapy: Secondary | ICD-10-CM | POA: Diagnosis not present

## 2018-08-09 DIAGNOSIS — G4700 Insomnia, unspecified: Secondary | ICD-10-CM | POA: Diagnosis not present

## 2018-08-09 DIAGNOSIS — Z780 Asymptomatic menopausal state: Secondary | ICD-10-CM | POA: Diagnosis not present

## 2018-08-09 DIAGNOSIS — M62830 Muscle spasm of back: Secondary | ICD-10-CM | POA: Diagnosis not present

## 2018-08-09 DIAGNOSIS — M545 Low back pain: Secondary | ICD-10-CM | POA: Diagnosis not present

## 2018-08-09 DIAGNOSIS — M5136 Other intervertebral disc degeneration, lumbar region: Secondary | ICD-10-CM | POA: Diagnosis not present

## 2018-08-12 DIAGNOSIS — N182 Chronic kidney disease, stage 2 (mild): Secondary | ICD-10-CM | POA: Diagnosis not present

## 2018-08-12 DIAGNOSIS — I1310 Hypertensive heart and chronic kidney disease without heart failure, with stage 1 through stage 4 chronic kidney disease, or unspecified chronic kidney disease: Secondary | ICD-10-CM | POA: Diagnosis not present

## 2018-08-12 DIAGNOSIS — N3946 Mixed incontinence: Secondary | ICD-10-CM | POA: Diagnosis not present

## 2018-08-12 DIAGNOSIS — N390 Urinary tract infection, site not specified: Secondary | ICD-10-CM | POA: Diagnosis not present

## 2018-09-06 DIAGNOSIS — D539 Nutritional anemia, unspecified: Secondary | ICD-10-CM | POA: Diagnosis not present

## 2018-09-06 DIAGNOSIS — M62830 Muscle spasm of back: Secondary | ICD-10-CM | POA: Diagnosis not present

## 2018-09-06 DIAGNOSIS — E559 Vitamin D deficiency, unspecified: Secondary | ICD-10-CM | POA: Diagnosis not present

## 2018-09-06 DIAGNOSIS — E7800 Pure hypercholesterolemia, unspecified: Secondary | ICD-10-CM | POA: Diagnosis not present

## 2018-09-06 DIAGNOSIS — Z79899 Other long term (current) drug therapy: Secondary | ICD-10-CM | POA: Diagnosis not present

## 2018-09-06 DIAGNOSIS — G4700 Insomnia, unspecified: Secondary | ICD-10-CM | POA: Diagnosis not present

## 2018-09-06 DIAGNOSIS — M545 Low back pain: Secondary | ICD-10-CM | POA: Diagnosis not present

## 2018-09-06 DIAGNOSIS — R5383 Other fatigue: Secondary | ICD-10-CM | POA: Diagnosis not present

## 2018-09-06 DIAGNOSIS — M129 Arthropathy, unspecified: Secondary | ICD-10-CM | POA: Diagnosis not present

## 2018-09-06 DIAGNOSIS — Z1159 Encounter for screening for other viral diseases: Secondary | ICD-10-CM | POA: Diagnosis not present

## 2018-09-23 DIAGNOSIS — N952 Postmenopausal atrophic vaginitis: Secondary | ICD-10-CM | POA: Diagnosis not present

## 2018-09-23 DIAGNOSIS — N390 Urinary tract infection, site not specified: Secondary | ICD-10-CM | POA: Diagnosis not present

## 2018-09-23 DIAGNOSIS — N3946 Mixed incontinence: Secondary | ICD-10-CM | POA: Diagnosis not present

## 2018-10-04 DIAGNOSIS — M545 Low back pain: Secondary | ICD-10-CM | POA: Diagnosis not present

## 2018-10-04 DIAGNOSIS — M62830 Muscle spasm of back: Secondary | ICD-10-CM | POA: Diagnosis not present

## 2018-10-04 DIAGNOSIS — G4700 Insomnia, unspecified: Secondary | ICD-10-CM | POA: Diagnosis not present

## 2018-10-04 DIAGNOSIS — M810 Age-related osteoporosis without current pathological fracture: Secondary | ICD-10-CM | POA: Diagnosis not present

## 2018-10-04 DIAGNOSIS — Z79899 Other long term (current) drug therapy: Secondary | ICD-10-CM | POA: Diagnosis not present

## 2018-11-01 DIAGNOSIS — M545 Low back pain: Secondary | ICD-10-CM | POA: Diagnosis not present

## 2018-11-01 DIAGNOSIS — E7800 Pure hypercholesterolemia, unspecified: Secondary | ICD-10-CM | POA: Diagnosis not present

## 2018-11-01 DIAGNOSIS — Z79899 Other long term (current) drug therapy: Secondary | ICD-10-CM | POA: Diagnosis not present

## 2018-11-01 DIAGNOSIS — M5136 Other intervertebral disc degeneration, lumbar region: Secondary | ICD-10-CM | POA: Diagnosis not present

## 2018-11-03 DIAGNOSIS — H04123 Dry eye syndrome of bilateral lacrimal glands: Secondary | ICD-10-CM | POA: Diagnosis not present

## 2018-11-03 DIAGNOSIS — H401132 Primary open-angle glaucoma, bilateral, moderate stage: Secondary | ICD-10-CM | POA: Diagnosis not present

## 2018-11-22 DIAGNOSIS — Z1331 Encounter for screening for depression: Secondary | ICD-10-CM | POA: Diagnosis not present

## 2018-11-22 DIAGNOSIS — Z139 Encounter for screening, unspecified: Secondary | ICD-10-CM | POA: Diagnosis not present

## 2018-11-22 DIAGNOSIS — Z9181 History of falling: Secondary | ICD-10-CM | POA: Diagnosis not present

## 2018-11-22 DIAGNOSIS — E785 Hyperlipidemia, unspecified: Secondary | ICD-10-CM | POA: Diagnosis not present

## 2018-11-22 DIAGNOSIS — Z6835 Body mass index (BMI) 35.0-35.9, adult: Secondary | ICD-10-CM | POA: Diagnosis not present

## 2018-11-22 DIAGNOSIS — Z0000 Encounter for general adult medical examination without abnormal findings: Secondary | ICD-10-CM | POA: Diagnosis not present

## 2018-11-29 DIAGNOSIS — E785 Hyperlipidemia, unspecified: Secondary | ICD-10-CM | POA: Diagnosis not present

## 2018-11-29 DIAGNOSIS — N182 Chronic kidney disease, stage 2 (mild): Secondary | ICD-10-CM | POA: Diagnosis not present

## 2018-11-29 DIAGNOSIS — I1310 Hypertensive heart and chronic kidney disease without heart failure, with stage 1 through stage 4 chronic kidney disease, or unspecified chronic kidney disease: Secondary | ICD-10-CM | POA: Diagnosis not present

## 2018-11-29 DIAGNOSIS — R05 Cough: Secondary | ICD-10-CM | POA: Diagnosis not present

## 2018-11-29 DIAGNOSIS — Z6836 Body mass index (BMI) 36.0-36.9, adult: Secondary | ICD-10-CM | POA: Diagnosis not present

## 2018-11-29 DIAGNOSIS — R739 Hyperglycemia, unspecified: Secondary | ICD-10-CM | POA: Diagnosis not present

## 2018-11-29 DIAGNOSIS — K5900 Constipation, unspecified: Secondary | ICD-10-CM | POA: Diagnosis not present

## 2018-11-29 DIAGNOSIS — Z23 Encounter for immunization: Secondary | ICD-10-CM | POA: Diagnosis not present

## 2018-11-29 DIAGNOSIS — K219 Gastro-esophageal reflux disease without esophagitis: Secondary | ICD-10-CM | POA: Diagnosis not present

## 2018-11-30 DIAGNOSIS — M5136 Other intervertebral disc degeneration, lumbar region: Secondary | ICD-10-CM | POA: Diagnosis not present

## 2018-11-30 DIAGNOSIS — M545 Low back pain: Secondary | ICD-10-CM | POA: Diagnosis not present

## 2018-11-30 DIAGNOSIS — Z79899 Other long term (current) drug therapy: Secondary | ICD-10-CM | POA: Diagnosis not present

## 2018-11-30 DIAGNOSIS — G4700 Insomnia, unspecified: Secondary | ICD-10-CM | POA: Diagnosis not present

## 2018-11-30 DIAGNOSIS — M62830 Muscle spasm of back: Secondary | ICD-10-CM | POA: Diagnosis not present

## 2018-12-28 DIAGNOSIS — G4700 Insomnia, unspecified: Secondary | ICD-10-CM | POA: Diagnosis not present

## 2018-12-28 DIAGNOSIS — Z1159 Encounter for screening for other viral diseases: Secondary | ICD-10-CM | POA: Diagnosis not present

## 2018-12-28 DIAGNOSIS — M545 Low back pain: Secondary | ICD-10-CM | POA: Diagnosis not present

## 2018-12-28 DIAGNOSIS — E559 Vitamin D deficiency, unspecified: Secondary | ICD-10-CM | POA: Diagnosis not present

## 2018-12-28 DIAGNOSIS — Z79899 Other long term (current) drug therapy: Secondary | ICD-10-CM | POA: Diagnosis not present

## 2018-12-28 DIAGNOSIS — M62830 Muscle spasm of back: Secondary | ICD-10-CM | POA: Diagnosis not present

## 2018-12-28 DIAGNOSIS — M5136 Other intervertebral disc degeneration, lumbar region: Secondary | ICD-10-CM | POA: Diagnosis not present

## 2018-12-28 DIAGNOSIS — D539 Nutritional anemia, unspecified: Secondary | ICD-10-CM | POA: Diagnosis not present

## 2018-12-28 DIAGNOSIS — M129 Arthropathy, unspecified: Secondary | ICD-10-CM | POA: Diagnosis not present

## 2018-12-28 DIAGNOSIS — E7800 Pure hypercholesterolemia, unspecified: Secondary | ICD-10-CM | POA: Diagnosis not present

## 2019-01-11 DIAGNOSIS — Z6836 Body mass index (BMI) 36.0-36.9, adult: Secondary | ICD-10-CM | POA: Diagnosis not present

## 2019-01-11 DIAGNOSIS — R002 Palpitations: Secondary | ICD-10-CM | POA: Diagnosis not present

## 2019-01-11 DIAGNOSIS — R05 Cough: Secondary | ICD-10-CM | POA: Diagnosis not present

## 2019-01-12 ENCOUNTER — Emergency Department (HOSPITAL_COMMUNITY)
Admission: EM | Admit: 2019-01-12 | Discharge: 2019-01-12 | Disposition: A | Discharge status: Home / Self Care | Payer: PPO | Attending: Emergency Medicine | Admitting: Emergency Medicine

## 2019-01-12 ENCOUNTER — Emergency Department (HOSPITAL_COMMUNITY): Payer: PPO

## 2019-01-12 ENCOUNTER — Other Ambulatory Visit: Payer: Self-pay

## 2019-01-12 DIAGNOSIS — R1013 Epigastric pain: Secondary | ICD-10-CM | POA: Diagnosis not present

## 2019-01-12 DIAGNOSIS — R0789 Other chest pain: Secondary | ICD-10-CM | POA: Diagnosis not present

## 2019-01-12 DIAGNOSIS — R079 Chest pain, unspecified: Secondary | ICD-10-CM | POA: Diagnosis not present

## 2019-01-12 DIAGNOSIS — R002 Palpitations: Secondary | ICD-10-CM | POA: Diagnosis not present

## 2019-01-12 DIAGNOSIS — I10 Essential (primary) hypertension: Secondary | ICD-10-CM | POA: Diagnosis not present

## 2019-01-12 DIAGNOSIS — M545 Low back pain: Secondary | ICD-10-CM | POA: Diagnosis not present

## 2019-01-12 DIAGNOSIS — M549 Dorsalgia, unspecified: Secondary | ICD-10-CM | POA: Diagnosis not present

## 2019-01-12 DIAGNOSIS — I700 Atherosclerosis of aorta: Secondary | ICD-10-CM | POA: Diagnosis not present

## 2019-01-12 DIAGNOSIS — R072 Precordial pain: Secondary | ICD-10-CM | POA: Diagnosis not present

## 2019-01-12 LAB — BASIC METABOLIC PANEL
Anion gap: 12 (ref 5–15)
BUN: 15 mg/dL (ref 8–23)
CO2: 25 mmol/L (ref 22–32)
Calcium: 9.6 mg/dL (ref 8.9–10.3)
Chloride: 97 mmol/L — ABNORMAL LOW (ref 98–111)
Creatinine, Ser: 1 mg/dL (ref 0.44–1.00)
GFR calc Af Amer: >60 mL/min (ref >60)
GFR calc non Af Amer: 55 mL/min — ABNORMAL LOW (ref >60)
Glucose, Bld: 133 mg/dL — ABNORMAL HIGH (ref 70–99)
Potassium: 3.7 mmol/L (ref 3.5–5.1)
Sodium: 134 mmol/L — ABNORMAL LOW (ref 135–145)

## 2019-01-12 LAB — CBC
HCT: 37.5 % (ref 36.0–46.0)
Hemoglobin: 12.4 g/dL (ref 12.0–15.0)
MCH: 30 pg (ref 26.0–34.0)
MCHC: 33.1 g/dL (ref 30.0–36.0)
MCV: 90.6 fL (ref 80.0–100.0)
Platelets: 280 10*3/uL (ref 150–400)
RBC: 4.14 MIL/uL (ref 3.87–5.11)
RDW: 12.6 % (ref 11.5–15.5)
WBC: 7.9 10*3/uL (ref 4.0–10.5)
nRBC: 0 % (ref 0.0–0.2)

## 2019-01-12 LAB — TROPONIN I (HIGH SENSITIVITY)
Troponin I (High Sensitivity): 7 ng/L (ref <18)
Troponin I (High Sensitivity): 9 ng/L (ref <18)

## 2019-01-12 LAB — LIPASE, BLOOD: Lipase: 39 U/L (ref 11–51)

## 2019-01-12 MED ORDER — IOHEXOL 350 MG/ML SOLN: 75.0000 mL | Freq: Once | INTRAVENOUS | Status: DC | PRN

## 2019-01-12 MED ORDER — SODIUM CHLORIDE 0.9% FLUSH: 3.0000 mL | Freq: Once | INTRAVENOUS | Status: DC

## 2019-01-12 MED ORDER — IOHEXOL 350 MG/ML SOLN
75.0000 mL | Freq: Once | INTRAVENOUS | Status: AC | PRN
  Administered 2019-01-12: 03:00:00 75 mL via INTRAVENOUS

## 2019-01-12 NOTE — Discharge Instructions (Addendum)
Your work-up today was overall reassuring and your cardiac enzymes were negative both times we checked them.  Please follow-up with your PCP and cardiology for further management.  If any symptoms change or worsen, please return to the nearest emergency department.

## 2019-01-12 NOTE — ED Notes (Signed)
Patient transported to CT 

## 2019-01-12 NOTE — ED Triage Notes (Signed)
Per pt she went to her pcp on Saturday for heart palpitations, and was told to go to ed if anything worsened, so tonight about 12 AM pt began having chest pain that radiates into he epigastric are ans into her back between shoulder blades. Pt is not having nausea, nor vomiting. Pt is took 324 aspirin and 2 nitro's. Pt's pain is 3/10 now.150/80, cbg 130,

## 2019-01-12 NOTE — ED Provider Notes (Signed)
Care assumed with Dr. Dayna Barker.  At time of transfer of care, patient is awaiting results of second troponin.  Patient second troponin is negative, anticipate discharge home for outpatient cardiology and PCP follow-up for chest discomfort.  9:28 AM Delta troponin was negative.  Patient agrees with discharge home and PCP/cardiology follow-up.  She no other questions or concerns and was discharged in good condition after reassuring work-up.  Clinical Impression: 1. Nonspecific chest pain     Disposition: Discharge  Condition: Good  I have discussed the results, Dx and Tx plan with the pt(& family if present). He/she/they expressed understanding and agree(s) with the plan. Discharge instructions discussed at great length. Strict return precautions discussed and pt &/or family have verbalized understanding of the instructions. No further questions at time of discharge.    New Prescriptions   No medications on file    Follow Up: Nicoletta Dress, MD [address] [place] [address] [place] [address] [id number] [place] [facility] [place] [phone]          Tegeler, Gwenyth Allegra, MD 01/12/19 [phone]

## 2019-01-14 ENCOUNTER — Encounter: Payer: Self-pay | Admitting: *Deleted

## 2019-01-14 ENCOUNTER — Ambulatory Visit (INDEPENDENT_AMBULATORY_CARE_PROVIDER_SITE_OTHER): Payer: PPO

## 2019-01-14 ENCOUNTER — Ambulatory Visit (INDEPENDENT_AMBULATORY_CARE_PROVIDER_SITE_OTHER): Payer: PPO | Admitting: Cardiology

## 2019-01-14 ENCOUNTER — Other Ambulatory Visit: Payer: Self-pay

## 2019-01-14 VITALS — BP 140/62 | HR 64 | Ht 64.0 in | Wt 206.0 lb

## 2019-01-14 DIAGNOSIS — I10 Essential (primary) hypertension: Secondary | ICD-10-CM

## 2019-01-14 DIAGNOSIS — R079 Chest pain, unspecified: Secondary | ICD-10-CM | POA: Diagnosis not present

## 2019-01-14 DIAGNOSIS — Z01812 Encounter for preprocedural laboratory examination: Secondary | ICD-10-CM | POA: Diagnosis not present

## 2019-01-14 DIAGNOSIS — R002 Palpitations: Secondary | ICD-10-CM | POA: Diagnosis not present

## 2019-01-14 NOTE — Progress Notes (Signed)
Cardiology Office Note:    Date:  01/14/2019   ID:  Karen Castillo, DOB [birth year], MRN [id number]  PCP:  Karen Dress, MD  Cardiologist:  No primary care provider on file.  Electrophysiologist:  None   Referring MD: Karen Dress, MD   No chief complaint on file.   History of Present Illness:    Karen Castillo is a 78 y.o. female with a hx of hypertension, hyperlipidemia presents today to be evaluated for palpitations.    Patient tells me that over the last several months she has been experiencing palpitations.  She explained an episode to me where she had a abrupt onset of fast heart rate and lasted for 10 minutes.  She notes with was remarkable about this episode was she started experience fullness in her neck and this particular time it lasted longer than prior.  She states that when this happened talk to her PCP who changed her atenolol to nadolol.  She says she was taking this medication as prescribed.    She tells me that last week she experienced an episode of chest pressure.  She notes it was this was at night when she was sitting and watching elevation she suddenly started to experience significant heaviness in her chest.  She states she decided to wait for a while to see if this will resolve but unfortunately it did not resolve.  She therefore called her daughter who came to help her and called 911.  He tells me on the way to the ED was given 2 nitroglycerin seemed to have helped after 25 minutes.  Unfortunately in the ED significantly hypertensive with a systolic blood pressure in the 60s.  She was treated quickly and her blood pressure recovered.  Work-up in the ED was unremarkable.  Therefore patient was asked to come see cardiology.  The patient does not have any chest pain during our encounter.  She is here with her daughter Karen Castillo.  Past Medical History:  Diagnosis Date  . Abdominal pain   . Acute post-operative pain 07/13/2017  . Allergic rhinitis   . Anemia    . Arthritis   . Bipolar disorder ([place])    patient denies  . Cancer (HCC)    SKIN    ,    LEFT BREAST   . Chronic bilateral low back pain 09/16/2016  . Chronic constipation   . Chronic pain syndrome   . Constipation   . 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 Dry eyes   . Dysuria   . Elevated cholesterol   . 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 Gallstones   . GERD (gastroesophageal reflux disease)   . Glaucoma    both eyes  . Gout   . Hypertension   . IBS (irritable bowel syndrome)   . Idiopathic peripheral neuropathy 06/11/2015  . 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 Ingrown nail of great toe of right foot 05/04/2017  . Insomnia   . Intractable low back pain 07/13/2017  . Low back pain 08/27/2017  . Nausea without vomiting   . Obesity   . Pain syndrome, chronic 11/29/2015  . Paronychia of great toe, right 05/04/2017  . Postoperative anemia due to acute blood loss 08/29/2017  . 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 Radiculopathy 05/04/2014  . Radiculopathy of lumbar region 11/29/2015  . Risk for falls 06/11/2015  . Schizophrenia ([place])   . Sleep apnea    USES CPAP   . Sleep apnea, obstructive  04/03/2015  . Urticaria   . Vitamin deficiency     Past Surgical History:  Procedure Laterality Date  . ABDOMINAL EXPOSURE N/A 05/04/2014   Procedure: ABDOMINAL EXPOSURE;  Surgeon: Serafina Mitchell, MD;  Location[place];  Service: Vascular;  Laterality: N/A;  . ANTERIOR LUMBAR FUSION N/A 05/04/2014   Procedure: ANTERIOR LUMBAR FUSION 1 LEVEL;  Surgeon: Sinclair Ship, MD;  Location: [place];  Service: Orthopedics;  Laterality: N/A;  Lumbar 5-sacrum 1 anterior lumbar interbody fusion with instrumentation and allograft  . ANTERIOR LUMBAR FUSION  07/2017  . ANTERIOR LUMBAR FUSION  08/2017  . BACK SURGERY     2010 ,2015LUMB FUSION, 06/2013 [facility] FUSION  . BACK SURGERY  07/2017   Lumbar  . BREAST BIOPSY  2013   pt said it was positive   . BREAST SURGERY     LEFT BRREAST BX  .  CARPAL TUNNEL RELEASE     1994 RT  . CARPAL TUNNEL RELEASE Bilateral   . CATARACT EXTRACTION, BILATERAL  2017  . CHOLECYSTECTOMY     2004  . COLONOSCOPY W/ BIOPSIES     Colonoscopy [id number], 2009, 2011  . ERCP     2009  . ESOPHAGOGASTRODUODENOSCOPY  08/09/2017   Small hiatal hernia. Mild gastritis. Incidenta; duodenal diverticulum. Incidental gastric polyp.   Marland Kitchen LEEP     1992 and 1994  . SKIN CANCER EXCISION     X 4   . thorocolumbar fusion  06/2013  . TUBAL LIGATION      Current Medications: Current Meds  Medication Sig  . alendronate (FOSAMAX) 70 MG tablet Take 70 mg by mouth every Thursday. Take with a full glass of water on an empty stomach.  Marland Kitchen allopurinol (ZYLOPRIM) 100 MG tablet Take 100 mg by mouth daily.  Marland Kitchen aspirin EC 81 MG tablet Take 81 mg by mouth daily.  . calcium carbonate (OSCAL) 1500 (600 Ca) MG TABS tablet Take 600 mg of elemental calcium by mouth 3 (three) times daily with meals.  . Cholecalciferol (VITAMIN D3) 2000 units TABS Take 1 tablet by mouth daily.  . cloNIDine (CATAPRES) 0.1 MG tablet Take 0.1 mg by mouth at bedtime.  . cycloSPORINE (RESTASIS) 0.05 % ophthalmic emulsion Place 1 drop into both eyes 4 (four) times daily.  . hydrochlorothiazide (HYDRODIURIL) 25 MG tablet Take 25 mg by mouth daily.  Marland Kitchen HYDROmorphone (DILAUDID) 2 MG tablet Take 2 mg by mouth every 4 (four) hours.  . Lactobacillus (PROBIOTIC ACIDOPHILUS PO) Take 1 tablet by mouth daily.  . lansoprazole (PREVACID) 30 MG capsule Take 30 mg by mouth daily before breakfast. 30 minutes before breakfast  . latanoprost (XALATAN) 0.005 % ophthalmic solution Place 1 drop into both eyes at bedtime.  Marland Kitchen linaclotide (LINZESS) 290 MCG CAPS capsule Take 290 mcg by mouth daily before breakfast.  . linaclotide (LINZESS) 290 MCG CAPS capsule Take 1 capsule (290 mcg total) by mouth daily before breakfast.  . LIVALO 2 MG TABS Take 1 tablet by mouth at bedtime.  . methocarbamol (ROBAXIN) 500 MG tablet Take 1  tablet (500 mg total) by mouth every 6 (six) hours as needed for muscle spasms (in between valium).  . nadolol (CORGARD) 40 MG tablet Take 40 mg by mouth at bedtime.  . Omega-3 Fatty Acids (FISH OIL) 600 MG CAPS Take 600 mg by mouth 2 (two) times daily.  Marland Kitchen oxybutynin (DITROPAN) 5 MG tablet Take 5 mg by mouth 2 (two) times daily.  . polyethylene glycol (MIRALAX / GLYCOLAX)  packet Take 17 g by mouth daily.  Marland Kitchen spironolactone (ALDACTONE) 25 MG tablet Take 25 mg by mouth daily.  . timolol (BETIMOL) 0.5 % ophthalmic solution Place 1 drop into both eyes daily.  . traZODone (DESYREL) 100 MG tablet Take 200 mg by mouth at bedtime.   Marland Kitchen trimethoprim (TRIMPEX) 100 MG tablet Take 100 mg by mouth daily.  . valsartan (DIOVAN) 80 MG tablet Take 80 mg by mouth daily.  . Wheat Dextrin (BENEFIBER) POWD Take 1 packet by mouth daily. Clear and Sugar FREE     Allergies:   Ciprofloxacin, Cyclobenzaprine, Pregabalin, Fluvastatin, Meloxicam, Metaxalone, Pramipexole, Pravastatin, Rosuvastatin, Sertraline, Statins, Other, Pravachol [pravastatin sodium], Amitriptyline, Arthrotec [diclofenac-misoprostol], Augmentin [amoxicillin-pot clavulanate], Bromfenac, Cephalexin, Conj estrog-medroxyprogest ace, Diclofenac-misoprostol, Erythromycin, Esomeprazole, Esomeprazole magnesium, Levofloxacin, Methadone, Metoclopramide, Oxycodone, Propoxyphene, Rofecoxib, Sulfur, Tramadol, and Zoloft [sertraline hcl]   Social History   Socioeconomic History  . Marital status: Widowed    Spouse name: Not on file  . Number of children: 2  . Years of education: Not on file  . Highest education level: Not on file  Occupational History  . Occupation: Retired  Scientific laboratory technician  . Financial resource strain: Not on file  . Food insecurity    Worry: Not on file    Inability: Not on file  . Transportation needs    Medical: Not on file    Non-medical: Not on file  Tobacco Use  . Smoking status: Never Smoker  . Smokeless tobacco: Never Used   Substance and Sexual Activity  . Alcohol use: No  . Drug use: No  . Sexual activity: Not on file  Lifestyle  . Physical activity    Days per week: Not on file    Minutes per session: Not on file  . Stress: Not on file  Relationships  . Social Herbalist on phone: Not on file    Gets together: Not on file    Attends religious service: Not on file    Active member of club or organization: Not on file    Attends meetings of clubs or organizations: Not on file    Relationship status: Not on file  Other Topics Concern  . Not on file  Social History Narrative  . Not on file     Family History: The patient's family history includes Diabetes in her mother; Prostate cancer in her father; Stroke in her sister. There is no history of Colon cancer or Esophageal cancer.  ROS:   Review of Systems  Constitution: Negative for decreased appetite, fever and weight gain.  HENT: Negative for congestion, ear discharge, hoarse voice and sore throat.   Eyes: Negative for discharge, redness, vision loss in right eye and visual halos.  Cardiovascular: Negative for chest pain, dyspnea on exertion, leg swelling, orthopnea and palpitations.  Respiratory: Negative for cough, hemoptysis, shortness of breath and snoring.   Endocrine: Negative for heat intolerance and polyphagia.  Hematologic/Lymphatic: Negative for bleeding problem. Does not bruise/bleed easily.  Skin: Negative for flushing, nail changes, rash and suspicious lesions.  Musculoskeletal: Negative for arthritis, joint pain, muscle cramps, myalgias, neck pain and stiffness.  Gastrointestinal: Negative for abdominal pain, bowel incontinence, diarrhea and excessive appetite.  Genitourinary: Negative for decreased libido, genital sores and incomplete emptying.  Neurological: Negative for brief paralysis, focal weakness, headaches and loss of balance.  Psychiatric/Behavioral: Negative for altered mental status, depression and suicidal  ideas.  Allergic/Immunologic: Negative for HIV exposure and persistent infections.    EKGs/Labs/Other Studies Reviewed:  The following studies were reviewed today:   EKG:  The ekg ordered today demonstrates sinus rhythm, heart rate 60 bpm poor R wave progression in the precordial leads suggesting evidence of anterior traction.  Compared to EKG done on 01/12/2019 no significant change.  Recent Labs: 01/12/2019: BUN 15; Creatinine, Ser 1.00; Hemoglobin 12.4; Platelets 280; Potassium 3.7; Sodium 134  Recent Lipid Panel No results found for: CHOL, TRIG, HDL, CHOLHDL, VLDL, LDLCALC, LDLDIRECT  Physical Exam:    VS:  BP 140/62 (BP Location: Left Arm, Patient Position: Sitting, Cuff Size: Large)   Pulse 64   Ht 5\' 4"  (1.626 m)   Wt 206 lb (93.4 kg)   SpO2 95%   BMI 35.36 kg/m     Wt Readings from Last 3 Encounters:  01/14/19 206 lb (93.4 kg)  01/12/19 203 lb (92.1 kg)  11/27/17 217 lb 6 oz (98.6 kg)     GEN: Well nourished, well developed in no acute distress HEENT: Normal NECK: No JVD; No carotid bruits LYMPHATICS: No lymphadenopathy CARDIAC: S1S2 noted,RRR, no murmurs, rubs, gallops RESPIRATORY:  Clear to auscultation without rales, wheezing or rhonchi  ABDOMEN: Soft, non-tender, non-distended, +bowel sounds, no guarding. EXTREMITIES: No edema, No cyanosis, no clubbing MUSCULOSKELETAL:  No edema; No deformity  SKIN: Warm and dry NEUROLOGIC:  Alert and oriented x 3, non-focal PSYCHIATRIC:  Normal affect, good insight  ASSESSMENT:    1. Chest pain, unspecified type   2. Palpitations   3. Pre-procedure lab exam   4. Hypertension, unspecified type    PLAN:    1.  The patient does have risk factor for coronary disease therefore this chest pain episode is concerning to me.  At this time I would like to restratify her by pursuing ischemic evaluation.  I have discussed with the patient about CT angio coronaries and she seemed agreeable for this testing.  She denies any IV  contrast allergy and is willing to proceed.  2.  In addition, I would like to rule out a cardiovascular etiology of this palpitation, therefore at this time I would like to placed a zio patch for   7 days. In additon a transthoracic echocardiogram will be ordered to assess LV/RV function and any structural abnormalities. Once these testing have been performed amd reviewed further reccomendations will be made. For now, I do reccomend that the patient goes to the nearest ED if  symptoms recur.  3.  Hypertension-we will continue patient on her current antihypertensive medication for now.  Have educated her about decreasing salt intake.  Her target blood pressure will be less than 130/80.  The patient is in agreement with the above plan. The patient left the office in stable condition.  The patient will follow up in 3 months or sooner if needed.   Medication Adjustments/Labs and Tests Ordered: Current medicines are reviewed at length with the patient today.  Concerns regarding medicines are outlined above.  Orders Placed This Encounter  Procedures  . CT CORONARY FRACTIONAL FLOW RESERVE DATA PREP  . CT CORONARY FRACTIONAL FLOW RESERVE FLUID ANALYSIS  . CT CORONARY MORPH W/CTA COR W/SCORE W/CA W/CM &/OR WO/CM  . Basic Metabolic Panel (BMET)  . LONG TERM MONITOR (3-14 DAYS)  . EKG 12-Lead  . ECHOCARDIOGRAM COMPLETE   No orders of the defined types were placed in this encounter.   Patient Instructions  Medication Instructions:  Your physician recommends that you continue on your current medications as directed. Please refer to the Current Medication list given to  you today.  *If you need a refill on your cardiac medications before your next appointment, please call your pharmacy*  Lab Work: Your physician recommends that you return for lab work in:   3-7 days prior to CT:BMP  If you have labs (blood work) drawn today and your tests are completely normal, you will receive your results only  by: Marland Kitchen MyChart Message (if you have MyChart) OR . A paper copy in the mail If you have any lab test that is abnormal or we need to change your treatment, we will call you to review the results.  Testing/Procedures: Your physician has requested that you have an echocardiogram. Echocardiography is a painless test that uses sound waves to create images of your heart. It provides your doctor with information about the size and shape of your heart and how well your heart's chambers and valves are working. This procedure takes approximately one hour. There are no restrictions for this procedure.  A zio monitor was placed today. It will remain on for 7 days. You will then return monitor and event diary in provided box. It takes 1-2 weeks for report to be downloaded and returned to [place]. We will call you with the results. If monitor falls off or has orange flashing light, please call Zio for further instructions.   Your physician has requested that you have cardiac CT. Cardiac computed tomography (CT) is a painless test that uses an x-ray machine to take clear, detailed pictures of your heart. For further information please visit [employer]. Please follow instruction sheet as given.  Your cardiac CT will be scheduled at one of the below locations:   [facility] [address] [place], [place] [address] [phone] [place] [address] [place], [place] [address] [phone]  If scheduled at [facility], please arrive at the [facility] main entrance of [facility] 30-45 minutes prior to test start time. Proceed to the [facility] Radiology Department (first floor) to check-in and test prep.  If scheduled at [facility], please arrive 15 mins early for check-in and test prep.  Please follow these instructions carefully (unless otherwise directed):  Hold all erectile  dysfunction medications at least 3 days (72 hrs) prior to test.  On the Night Before the Test: . Be sure to Drink plenty of water. . Do not consume any caffeinated/decaffeinated beverages or chocolate 12 hours prior to your test. . Do not take any antihistamines 12 hours prior to your test.  On the Day of the Test: . Drink plenty of water. Do not drink any water within one hour of the test. . Do not eat any food 4 hours prior to the test. . You may take your regular medications prior to the test.  . Take Nadolol 40 mg  two hours prior to test. . HOLD Hydrochlorothiazide morning of the test. . FEMALES- please wear underwire-free bra if available   After the Test: . Drink plenty of water. . After receiving IV contrast, you may experience a mild flushed feeling. This is normal. . On occasion, you may experience a mild rash up to 24 hours after the test. This is not dangerous. If this occurs, you can take Benadryl 25 mg and increase your fluid intake. . If you experience trouble breathing, this can be serious. If it is severe call 911 IMMEDIATELY. If it is mild, please call our office.  Once we have  confirmed authorization from your insurance company, we will call you to set up a date and time for your test.   For non-scheduling related questions, please contact the cardiac imaging nurse navigator should you have any questions/concerns: Marchia Bond, RN Navigator Cardiac Imaging [facility] [phone] Office    Follow-Up: At [facility], you and your health needs are our priority.  As part of our continuing mission to provide you with exceptional heart care, we have created designated Provider Care Teams.  These Care Teams include your primary Cardiologist (physician) and Advanced Practice Providers (APPs -  Physician Assistants and Nurse Practitioners) who all work together to provide you with the care you need, when you need it.  Your next appointment:    3 months  The format for your next appointment:   In Person  Provider:   Berniece Salines, DO  Other Instructions      Adopting a Healthy Lifestyle.  Know what a healthy weight is for you (roughly BMI <25) and aim to maintain this   Aim for 7+ servings of fruits and vegetables daily   65-80+ fluid ounces of water or unsweet tea for healthy kidneys   Limit to max 1 drink of alcohol per day; avoid smoking/tobacco   Limit animal fats in diet for cholesterol and heart health - choose grass fed whenever available   Avoid highly processed foods, and foods high in saturated/trans fats   Aim for low stress - take time to unwind and care for your mental health   Aim for 150 min of moderate intensity exercise weekly for heart health, and weights twice weekly for bone health   Aim for 7-9 hours of sleep daily   When it comes to diets, agreement about the perfect plan isnt easy to find, even among the experts. Experts at the [employer] developed an idea known as the Healthy Eating Plate. Just imagine a plate divided into logical, healthy portions.   The emphasis is on diet quality:   Load up on vegetables and fruits - one-half of your plate: Aim for color and variety, and remember that potatoes dont count.   Go for whole grains - one-quarter of your plate: Whole wheat, barley, wheat berries, quinoa, oats, brown rice, and foods made with them. If you want pasta, go with whole wheat pasta.   Protein power - one-quarter of your plate: Fish, chicken, beans, and nuts are all healthy, versatile protein sources. Limit red meat.   The diet, however, does go beyond the plate, offering a few other suggestions.   Use healthy plant oils, such as olive, canola, soy, corn, sunflower and peanut. Check the labels, and avoid partially hydrogenated oil, which have unhealthy trans fats.   If youre thirsty, drink water. Coffee and tea are good in moderation, but skip sugary drinks  and limit milk and dairy products to one or two daily servings.   The type of carbohydrate in the diet is more important than the amount. Some sources of carbohydrates, such as vegetables, fruits, whole grains, and beans-are healthier than others.   Finally, stay active  Signed, Berniece Salines, DO  01/14/2019 3:28 PM    [facility]

## 2019-01-14 NOTE — Patient Instructions (Addendum)
Medication Instructions:  Your physician recommends that you continue on your current medications as directed. Please refer to the Current Medication list given to you today.  *If you need a refill on your cardiac medications before your next appointment, please call your pharmacy*  Lab Work: Your physician recommends that you return for lab work in:   3-7 days prior to CT:BMP  If you have labs (blood work) drawn today and your tests are completely normal, you will receive your results only by: Marland Kitchen MyChart Message (if you have MyChart) OR . A paper copy in the mail If you have any lab test that is abnormal or we need to change your treatment, we will call you to review the results.  Testing/Procedures: Your physician has requested that you have an echocardiogram. Echocardiography is a painless test that uses sound waves to create images of your heart. It provides your doctor with information about the size and shape of your heart and how well your heart's chambers and valves are working. This procedure takes approximately one hour. There are no restrictions for this procedure.  A zio monitor was placed today. It will remain on for 7 days. You will then return monitor and event diary in provided box. It takes 1-2 weeks for report to be downloaded and returned to [place]. We will call you with the results. If monitor falls off or has orange flashing light, please call Zio for further instructions.   Your physician has requested that you have cardiac CT. Cardiac computed tomography (CT) is a painless test that uses an x-ray machine to take clear, detailed pictures of your heart. For further information please visit [employer]. Please follow instruction sheet as given.  Your cardiac CT will be scheduled at one of the below locations:   [facility] [address] [place], [place] [address] (336) [place] [address] [place],  [address] [phone]  If scheduled at [facility], please arrive at the [facility] main entrance of [facility] 30-45 minutes prior to test start time. Proceed to the [facility] Radiology Department (first floor) to check-in and test prep.  If scheduled at [facility], please arrive 15 mins early for check-in and test prep.  Please follow these instructions carefully (unless otherwise directed):  Hold all erectile dysfunction medications at least 3 days (72 hrs) prior to test.  On the Night Before the Test: . Be sure to Drink plenty of water. . Do not consume any caffeinated/decaffeinated beverages or chocolate 12 hours prior to your test. . Do not take any antihistamines 12 hours prior to your test.  On the Day of the Test: . Drink plenty of water. Do not drink any water within one hour of the test. . Do not eat any food 4 hours prior to the test. . You may take your regular medications prior to the test.  . Take Nadolol 40 mg  two hours prior to test. . HOLD Hydrochlorothiazide morning of the test. . FEMALES- please wear underwire-free bra if available   After the Test: . Drink plenty of water. . After receiving IV contrast, you may experience a mild flushed feeling. This is normal. . On occasion, you may experience a mild rash up to 24 hours after the test. This is not dangerous. If this occurs, you can take Benadryl 25 mg and increase your fluid intake. . If you experience trouble breathing,  this can be serious. If it is severe call 911 IMMEDIATELY. If it is mild, please call our office.  Once we have confirmed authorization from your insurance company, we will call you to set up a date and time for your test.   For non-scheduling related questions, please contact the cardiac imaging nurse navigator should you have any questions/concerns: Marchia Bond, RN Navigator Cardiac Imaging [facility] [phone] Office    Follow-Up: At [facility], you and your health needs are our priority.  As part of our continuing mission to provide you with exceptional heart care, we have created designated Provider Care Teams.  These Care Teams include your primary Cardiologist (physician) and Advanced Practice Providers (APPs -  Physician Assistants and Nurse Practitioners) who all work together to provide you with the care you need, when you need it.  Your next appointment:   3 months  The format for your next appointment:   In Person  Provider:   Berniece Salines, DO  Other Instructions

## 2019-01-15 ENCOUNTER — Other Ambulatory Visit: Payer: Self-pay

## 2019-01-15 ENCOUNTER — Encounter (HOSPITAL_COMMUNITY): Payer: Self-pay

## 2019-01-15 ENCOUNTER — Observation Stay (HOSPITAL_COMMUNITY)
Admission: EM | Admit: 2019-01-15 | Discharge: 2019-01-17 | Disposition: A | Discharge status: Home / Self Care | Payer: PPO | Attending: Internal Medicine | Admitting: Internal Medicine

## 2019-01-15 DIAGNOSIS — R079 Chest pain, unspecified: Secondary | ICD-10-CM | POA: Diagnosis present

## 2019-01-15 DIAGNOSIS — Z853 Personal history of malignant neoplasm of breast: Secondary | ICD-10-CM | POA: Insufficient documentation

## 2019-01-15 DIAGNOSIS — Z881 Allergy status to other antibiotic agents status: Secondary | ICD-10-CM | POA: Diagnosis not present

## 2019-01-15 DIAGNOSIS — Z79891 Long term (current) use of opiate analgesic: Secondary | ICD-10-CM | POA: Diagnosis not present

## 2019-01-15 DIAGNOSIS — I2510 Atherosclerotic heart disease of native coronary artery without angina pectoris: Secondary | ICD-10-CM | POA: Insufficient documentation

## 2019-01-15 DIAGNOSIS — I10 Essential (primary) hypertension: Secondary | ICD-10-CM | POA: Insufficient documentation

## 2019-01-15 DIAGNOSIS — E782 Mixed hyperlipidemia: Secondary | ICD-10-CM | POA: Diagnosis not present

## 2019-01-15 DIAGNOSIS — Z20828 Contact with and (suspected) exposure to other viral communicable diseases: Secondary | ICD-10-CM | POA: Insufficient documentation

## 2019-01-15 DIAGNOSIS — Z880 Allergy status to penicillin: Secondary | ICD-10-CM | POA: Insufficient documentation

## 2019-01-15 DIAGNOSIS — R0789 Other chest pain: Principal | ICD-10-CM | POA: Insufficient documentation

## 2019-01-15 DIAGNOSIS — R109 Unspecified abdominal pain: Secondary | ICD-10-CM

## 2019-01-15 DIAGNOSIS — Z888 Allergy status to other drugs, medicaments and biological substances status: Secondary | ICD-10-CM | POA: Diagnosis not present

## 2019-01-15 DIAGNOSIS — G894 Chronic pain syndrome: Secondary | ICD-10-CM | POA: Insufficient documentation

## 2019-01-15 DIAGNOSIS — K5904 Chronic idiopathic constipation: Secondary | ICD-10-CM | POA: Insufficient documentation

## 2019-01-15 DIAGNOSIS — Z7982 Long term (current) use of aspirin: Secondary | ICD-10-CM | POA: Diagnosis not present

## 2019-01-15 DIAGNOSIS — M545 Low back pain: Secondary | ICD-10-CM | POA: Insufficient documentation

## 2019-01-15 DIAGNOSIS — Z79899 Other long term (current) drug therapy: Secondary | ICD-10-CM | POA: Diagnosis not present

## 2019-01-15 DIAGNOSIS — I491 Atrial premature depolarization: Secondary | ICD-10-CM | POA: Diagnosis not present

## 2019-01-15 MED ORDER — NITROGLYCERIN 0.4 MG SL SUBL
0.4000 mg | SUBLINGUAL_TABLET | SUBLINGUAL | Status: AC | PRN
  Administered 2019-01-16 (×3): 0.4 mg via SUBLINGUAL
  Filled 2019-01-15: qty 1

## 2019-01-15 NOTE — ED Provider Notes (Addendum)
TIME SEEN: 11:34 PM  CHIEF COMPLAINT: chest pain  HPI: Patient is a 78 y.o. F with HTN, HLD, and obesity who presents to the emergency department chest pain.  States today at rest while watching TV around 3:40 PM he developed pressure in chest worse at 9pm and wrapped around chest.  Has no SOB, no N/V, no diaphoresis, no dizziness.  Came in by EMS from home.  No A/A factors.  Pain not exertional, pleuritic, related to food.  No PE, DVT.  No LE pain.  Legs swelling for several days.  History of same after back surgery.  No history of CHF.  Took 325 mg at home.  No meds given by EMS.  Never had a cardiac catheterization.  Last stress test was 10 years ago.  In ED, 01/12/19 for same.  Negative CTA for dissection.  Two negative troponins.  States that pain resolved with 2 nitroglycerin tablets.  Sent to follow-up with outpatient cardiology. Saw cards 01/14/19.  Wearing holter given intermittent palpitations.  Scheduled for coronary artery CT.    Intermittent cough since March.  No fever, V, D, loss of taste or smell, COVID exposures.    Cardiology - Dr. Harriet Masson PCP - Dr. Delena Bali  ROS: See HPI Constitutional: no fever  Eyes: no drainage  ENT: no runny nose   Cardiovascular:   chest pain  Resp: no SOB  GI: no vomiting GU: no dysuria Integumentary: no rash  Allergy: no hives  Musculoskeletal: no leg swelling  Neurological: no slurred speech ROS otherwise negative  PAST MEDICAL HISTORY/PAST SURGICAL HISTORY:  Past Medical History:  Diagnosis Date  . Abdominal pain   . Acute post-operative pain 07/13/2017  . Allergic rhinitis   . Anemia   . Arthritis   . Bipolar disorder ([employer])    patient denies  . Cancer (HCC)    SKIN    ,    LEFT BREAST   . Chronic bilateral low back pain 09/16/2016  . Chronic constipation   . Chronic pain syndrome   . Constipation   . 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 Dry eyes   . Dysuria   . Elevated cholesterol   . 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 Gallstones   . GERD (gastroesophageal reflux disease)   . Glaucoma    both eyes  . Gout   . Hypertension   . IBS (irritable bowel syndrome)   . Idiopathic peripheral neuropathy 06/11/2015  . 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 Ingrown nail of great toe of right foot 05/04/2017  . Insomnia   . Intractable low back pain 07/13/2017  . Low back pain 08/27/2017  . Nausea without vomiting   . Obesity   . Pain syndrome, chronic 11/29/2015  . Paronychia of great toe, right 05/04/2017  . Postoperative anemia due to acute blood loss 08/29/2017  . 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 Radiculopathy 05/04/2014  . Radiculopathy of lumbar region 11/29/2015  . Risk for falls 06/11/2015  . Schizophrenia ([place])   . Sleep apnea    USES CPAP   . Sleep apnea, obstructive 04/03/2015  . Urticaria   . Vitamin deficiency     MEDICATIONS:  Prior to Admission medications   Medication Sig Start Date End Date Taking? Authorizing Provider  alendronate (FOSAMAX) 70 MG tablet Take 70 mg by mouth every Thursday. Take with a full glass of water on an empty stomach.    [provider]  allopurinol (ZYLOPRIM) 100 MG tablet Take  100 mg by mouth daily.    [provider]  aspirin EC 81 MG tablet Take 81 mg by mouth daily.    [provider]  calcium carbonate (OSCAL) 1500 (600 Ca) MG TABS tablet Take 600 mg of elemental calcium by mouth 3 (three) times daily with meals.    [provider]  Cholecalciferol (VITAMIN D3) 2000 units TABS Take 1 tablet by mouth daily.    [provider]  cloNIDine (CATAPRES) 0.1 MG tablet Take 0.1 mg by mouth at bedtime.    [provider]  cycloSPORINE (RESTASIS) 0.05 % ophthalmic emulsion Place 1 drop into both eyes 4 (four) times daily.    [provider]  hydrochlorothiazide (HYDRODIURIL) 25 MG tablet Take 25 mg by mouth daily. 01/14/19   [provider]  HYDROmorphone (DILAUDID) 2 MG tablet Take 2 mg by  mouth every 4 (four) hours.    [provider]  Lactobacillus (PROBIOTIC ACIDOPHILUS PO) Take 1 tablet by mouth daily.    [provider]  lansoprazole (PREVACID) 30 MG capsule Take 30 mg by mouth daily before breakfast. 30 minutes before breakfast    [provider]  latanoprost (XALATAN) 0.005 % ophthalmic solution Place 1 drop into both eyes at bedtime.    [provider]  linaclotide (LINZESS) 290 MCG CAPS capsule Take 290 mcg by mouth daily before breakfast.    [provider]  linaclotide (LINZESS) 290 MCG CAPS capsule Take 1 capsule (290 mcg total) by mouth daily before breakfast. 11/27/17   Jackquline Denmark, MD  LIVALO 2 MG TABS Take 1 tablet by mouth at bedtime. 12/27/18   [provider]  methocarbamol (ROBAXIN) 500 MG tablet Take 1 tablet (500 mg total) by mouth every 6 (six) hours as needed for muscle spasms (in between valium). 09/01/17   McKenzie, Lennie Muckle, PA-C  nadolol (CORGARD) 40 MG tablet Take 40 mg by mouth at bedtime.    [provider]  Omega-3 Fatty Acids (FISH OIL) 600 MG CAPS Take 600 mg by mouth 2 (two) times daily.    [provider]  oxybutynin (DITROPAN) 5 MG tablet Take 5 mg by mouth 2 (two) times daily.    [provider]  polyethylene glycol (MIRALAX / GLYCOLAX) packet Take 17 g by mouth daily. 07/23/17   Kayleen Memos, DO  spironolactone (ALDACTONE) 25 MG tablet Take 25 mg by mouth daily.    [provider]  timolol (BETIMOL) 0.5 % ophthalmic solution Place 1 drop into both eyes daily.    [provider]  traZODone (DESYREL) 100 MG tablet Take 200 mg by mouth at bedtime.     [provider]  trimethoprim (TRIMPEX) 100 MG tablet Take 100 mg by mouth daily.    [provider]  valsartan (DIOVAN) 80 MG tablet Take 80 mg by mouth daily.    [provider]  Wheat Dextrin (BENEFIBER) POWD Take 1 packet by mouth daily. Clear and Sugar FREE    [provider]    ALLERGIES:  Allergies  Allergen Reactions  . Ciprofloxacin Swelling    Tongue swells  . Cyclobenzaprine Swelling    Feet swell   . Pregabalin Anaphylaxis, Shortness Of Breath, Swelling and Other (See Comments)    Dizziness also  . Fluvastatin Other (See Comments)    Leg pain   . Meloxicam Swelling    Feet became swollen  . Metaxalone Rash and Other (See Comments)    Flushing of the skin  and sore throat, too  . Pramipexole Nausea And Vomiting  . Pravastatin Other (See Comments)    Leg pain  . Rosuvastatin Other (See Comments)    Leg pain  . Sertraline Other (See Comments)    Sensitive teeth  . Statins Other (See Comments)    Leg pain     . Other     Darvocet  . Pravachol [Pravastatin Sodium]     Upper leg pain  . Amitriptyline Other (See Comments)    Weird feeling all over  . Arthrotec [Diclofenac-Misoprostol] Diarrhea  . Augmentin [Amoxicillin-Pot Clavulanate] Diarrhea    Has patient had a PCN reaction causing immediate rash, facial/tongue/throat swelling, SOB or lightheadedness with hypotension: No Has patient had a PCN reaction causing severe rash involving mucus membranes or skin necrosis: No Has patient had a PCN reaction that required hospitalization: No Has patient had a PCN reaction occurring within the last 10 years: No If all of the above answers are "NO", then may proceed with Cephalosporin use.   . Bromfenac Diarrhea and Nausea Only  . Cephalexin Diarrhea and Nausea Only  . Conj Estrog-Medroxyprogest Ace Other (See Comments)    Unsure of reaction type Couldn't tolerate  . Diclofenac-Misoprostol Nausea Only and Diarrhea  . Erythromycin Diarrhea and Nausea Only  . Esomeprazole Diarrhea  . Esomeprazole Magnesium Nausea Only  . Levofloxacin Other (See Comments) and Nausea Only    Stomach upset  . Methadone Diarrhea  . Metoclopramide Diarrhea and Nausea Only  . Oxycodone Hives, Swelling and Rash  . Propoxyphene Diarrhea and Nausea  Only  . Rofecoxib Diarrhea and Nausea Only  . Sulfur Diarrhea and Nausea Only  . Tramadol Diarrhea  . Zoloft [Sertraline Hcl] Other (See Comments)    Sensitivity with teeth    SOCIAL HISTORY:  Social History   Tobacco Use  . Smoking status: Never Smoker  . Smokeless tobacco: Never Used  Substance Use Topics  . Alcohol use: No    FAMILY HISTORY: Family History  Problem Relation Age of Onset  . Diabetes Mother        had a history but not anymore  . Prostate cancer Father   . Stroke Sister        has a brain tumor  . Colon cancer Neg Hx   . Esophageal cancer Neg Hx     EXAM: BP (!) 175/61   Pulse 65   Temp 97.6 F (36.4 C) (Oral)   Resp 13   SpO2 98%  CONSTITUTIONAL: Alert and oriented and responds appropriately to questions. Well-appearing; well-nourished, obese HEAD: Normocephalic EYES: Conjunctivae clear, pupils appear equal, EOMI ENT: normal nose; moist mucous membranes NECK: Supple, no meningismus, no nuchal rigidity, no LAD  CARD: RRR; S1 and S2 appreciated; no murmurs, no clicks, no rubs, no gallops CHEST:  Chest wall is nontender to palpation.  No crepitus, ecchymosis, erythema, warmth, rash or other lesions present.   RESP: Normal chest excursion without splinting or tachypnea; breath sounds clear and equal bilaterally; no wheezes, no rhonchi, no rales, no hypoxia or respiratory distress, speaking full sentences ABD/GI: Normal bowel sounds; non-distended; soft, non-tender, no rebound, no guarding, no peritoneal signs, no hepatosplenomegaly BACK:  The back appears normal and is non-tender to palpation, there is no CVA tenderness EXT: Normal ROM in all joints; non-tender to palpation; no edema; normal capillary refill; no cyanosis, no calf tenderness or swelling    SKIN: Normal color for age and race; warm; no rash NEURO: Moves all extremities equally PSYCH:  The patient's mood and manner are appropriate. Grooming and personal hygiene are appropriate.  MEDICAL  DECISION MAKING: Patient here with active chest pain.  EKG shows no ischemia, arrhythmia.  Will obtain cardiac labs, chest x-ray.  I feel patient will need admission given recurrent episodes of chest pain with multiple risk factors.  She agrees.  Took aspirin at home.  Will give nitroglycerin.  ED PROGRESS: Labs unremarkable with negative troponin and normal BNP.  Chest x-ray clear.  Still complaining of chest pressure after 3 nitroglycerin tablets.  Will give morphine and Zofran.  HEART score 5.    1:30 AM  Patient states no significant improvement after 1 dose of morphine.  Will give second dose of morphine and likely start nitro drip.  Will discuss with cardiology on-call.   1:31 AM  Spoke with Dr. Emilio Aspen on-call for cardiology.  He agrees that patient can have a CT of her coronary arteries and this can be done in the morning.  Does recommend medicine admission and cardiology will consult.  Does state that it is reassuring that her troponins so far have been negative.  Agrees with nitroglycerin drip and starting heparin drip.  Will discuss with hospitalist.  Cardiology states he will see the patient in the ED tonight.  1:53 AM Discussed patient's case with hospitalist, Dr. Myna Hidalgo.  I have recommended admission and patient (and family if present) agree with this plan. Admitting physician will place admission orders.   Third EKG shows no changes.  Second troponin being drawn.  Remains hemodynamically stable.  On nitroglycerin drip.   I reviewed all nursing notes, vitals, pertinent previous records and interpreted all EKGs, lab and urine results, imaging (as available).     EKG Interpretation  Date/Time:  Saturday January 15 2019 23:52:50 EST Ventricular Rate:  68 PR Interval:    QRS Duration: 104 QT Interval:  421 QTC Calculation: 448 R Axis:   59 Text Interpretation: Sinus rhythm No significant change since last tracing Confirmed by Pryor Curia [phone]) on 01/15/2019 11:55:53 PM        EKG Interpretation  Date/Time:  Sunday January 16 2019 01:48:01 EST Ventricular Rate:  58 PR Interval:    QRS Duration: 99 QT Interval:  444 QTC Calculation: 437 R Axis:   47 Text Interpretation: Sinus rhythm No significant change since last tracing Confirmed by Pryor Curia [phone]) on 01/16/2019 1:53:53 AM        CRITICAL CARE Performed by: Cyril Mourning Dimarco Minkin   Total critical care time: 43 minutes  Critical care time was exclusive of separately billable procedures and treating other patients.  Critical care was necessary to treat or prevent imminent or life-threatening deterioration.  Critical care was time spent personally by me on the following activities: development of treatment plan with patient and/or surrogate as well as nursing, discussions with consultants, evaluation of patient's response to treatment, examination of patient, obtaining history from patient or surrogate, ordering and performing treatments and interventions, ordering and review of laboratory studies, ordering and review of radiographic studies, pulse oximetry and re-evaluation of patient's condition.    MONAYE WEECH was evaluated in Emergency Department on 01/15/2019 for the symptoms described in the history of present illness. She was evaluated in the context of the global COVID-19 pandemic, which necessitated consideration that the patient might be at risk for infection with the SARS-CoV-2 virus that causes COVID-19. Institutional protocols and algorithms that pertain to the evaluation of patients at risk for COVID-19 are in a state of rapid  change based on information released by regulatory bodies including the CDC and federal and state organizations. These policies and algorithms were followed during the patient's care in the ED.      Luisangel Wainright, Delice Bison, DO 01/16/19 0154    Yosiel Thieme, Delice Bison, DO 01/16/19 0221

## 2019-01-15 NOTE — ED Triage Notes (Addendum)
Pt arrived from [facility] with c/o centralized CP that started at 340p; pt states that pain increasingly got worse at 9p and begin to radiate to her back; pt had similar episode on the 4th and states that on Fri at Cardiology appt she was placed on Holter monitor; pt took ASA 325; no nitro; denies N/V/D; occasional PVCs noted per EMS; 154/86; 14; 70; pt has chronic back pain

## 2019-01-16 ENCOUNTER — Observation Stay (HOSPITAL_BASED_OUTPATIENT_CLINIC_OR_DEPARTMENT_OTHER): Payer: PPO

## 2019-01-16 ENCOUNTER — Other Ambulatory Visit: Payer: Self-pay

## 2019-01-16 ENCOUNTER — Observation Stay (HOSPITAL_COMMUNITY): Payer: PPO

## 2019-01-16 ENCOUNTER — Emergency Department (HOSPITAL_COMMUNITY): Payer: PPO

## 2019-01-16 ENCOUNTER — Encounter (HOSPITAL_COMMUNITY): Payer: Self-pay | Admitting: Family Medicine

## 2019-01-16 DIAGNOSIS — E782 Mixed hyperlipidemia: Secondary | ICD-10-CM | POA: Diagnosis not present

## 2019-01-16 DIAGNOSIS — R079 Chest pain, unspecified: Secondary | ICD-10-CM | POA: Diagnosis present

## 2019-01-16 DIAGNOSIS — G894 Chronic pain syndrome: Secondary | ICD-10-CM | POA: Diagnosis not present

## 2019-01-16 DIAGNOSIS — Z79891 Long term (current) use of opiate analgesic: Secondary | ICD-10-CM | POA: Diagnosis not present

## 2019-01-16 DIAGNOSIS — Z853 Personal history of malignant neoplasm of breast: Secondary | ICD-10-CM | POA: Diagnosis not present

## 2019-01-16 DIAGNOSIS — R0789 Other chest pain: Secondary | ICD-10-CM | POA: Diagnosis not present

## 2019-01-16 DIAGNOSIS — I10 Essential (primary) hypertension: Secondary | ICD-10-CM

## 2019-01-16 DIAGNOSIS — Z888 Allergy status to other drugs, medicaments and biological substances status: Secondary | ICD-10-CM | POA: Diagnosis not present

## 2019-01-16 DIAGNOSIS — K5904 Chronic idiopathic constipation: Secondary | ICD-10-CM | POA: Diagnosis not present

## 2019-01-16 DIAGNOSIS — M545 Low back pain: Secondary | ICD-10-CM | POA: Diagnosis not present

## 2019-01-16 DIAGNOSIS — Z20828 Contact with and (suspected) exposure to other viral communicable diseases: Secondary | ICD-10-CM | POA: Diagnosis not present

## 2019-01-16 DIAGNOSIS — Z7982 Long term (current) use of aspirin: Secondary | ICD-10-CM | POA: Diagnosis not present

## 2019-01-16 DIAGNOSIS — I340 Nonrheumatic mitral (valve) insufficiency: Secondary | ICD-10-CM | POA: Diagnosis not present

## 2019-01-16 DIAGNOSIS — Z79899 Other long term (current) drug therapy: Secondary | ICD-10-CM | POA: Diagnosis not present

## 2019-01-16 LAB — BASIC METABOLIC PANEL
Anion gap: 10 (ref 5–15)
Anion gap: 11 (ref 5–15)
BUN: 12 mg/dL (ref 8–23)
BUN: 12 mg/dL (ref 8–23)
CO2: 23 mmol/L (ref 22–32)
CO2: 22 mmol/L (ref 22–32)
Calcium: 9.2 mg/dL (ref 8.9–10.3)
Calcium: 9 mg/dL (ref 8.9–10.3)
Chloride: 101 mmol/L (ref 98–111)
Chloride: 105 mmol/L (ref 98–111)
Creatinine, Ser: 0.85 mg/dL (ref 0.44–1.00)
Creatinine, Ser: 0.95 mg/dL (ref 0.44–1.00)
GFR calc Af Amer: >60 mL/min (ref >60)
GFR calc Af Amer: >60 mL/min (ref >60)
GFR calc non Af Amer: >60 mL/min (ref >60)
GFR calc non Af Amer: 58 mL/min — ABNORMAL LOW (ref >60)
Glucose, Bld: 122 mg/dL — ABNORMAL HIGH (ref 70–99)
Glucose, Bld: 119 mg/dL — ABNORMAL HIGH (ref 70–99)
Potassium: 3.7 mmol/L (ref 3.5–5.1)
Potassium: 3.7 mmol/L (ref 3.5–5.1)
Sodium: 134 mmol/L — ABNORMAL LOW (ref 135–145)
Sodium: 138 mmol/L (ref 135–145)

## 2019-01-16 LAB — CBC WITH DIFFERENTIAL/PLATELET
Abs Immature Granulocytes: 0.03 10*3/uL (ref 0.00–0.07)
Basophils Absolute: 0 10*3/uL (ref 0.0–0.1)
Basophils Relative: 1 %
Eosinophils Absolute: 0.2 10*3/uL (ref 0.0–0.5)
Eosinophils Relative: 3 %
HCT: 38 % (ref 36.0–46.0)
Hemoglobin: 12.5 g/dL (ref 12.0–15.0)
Immature Granulocytes: 0 %
Lymphocytes Relative: 30 %
Lymphs Abs: 2.5 10*3/uL (ref 0.7–4.0)
MCH: 29.8 pg (ref 26.0–34.0)
MCHC: 32.9 g/dL (ref 30.0–36.0)
MCV: 90.7 fL (ref 80.0–100.0)
Monocytes Absolute: 0.6 10*3/uL (ref 0.1–1.0)
Monocytes Relative: 7 %
Neutro Abs: 4.8 10*3/uL (ref 1.7–7.7)
Neutrophils Relative %: 59 %
Platelets: 282 10*3/uL (ref 150–400)
RBC: 4.19 MIL/uL (ref 3.87–5.11)
RDW: 12.7 % (ref 11.5–15.5)
WBC: 8.1 10*3/uL (ref 4.0–10.5)
nRBC: 0 % (ref 0.0–0.2)

## 2019-01-16 LAB — CBC
HCT: 35.9 % — ABNORMAL LOW (ref 36.0–46.0)
Hemoglobin: 11.6 g/dL — ABNORMAL LOW (ref 12.0–15.0)
MCH: 29.7 pg (ref 26.0–34.0)
MCHC: 32.3 g/dL (ref 30.0–36.0)
MCV: 91.8 fL (ref 80.0–100.0)
Platelets: 265 10*3/uL (ref 150–400)
RBC: 3.91 MIL/uL (ref 3.87–5.11)
RDW: 12.7 % (ref 11.5–15.5)
WBC: 8.2 10*3/uL (ref 4.0–10.5)
nRBC: 0 % (ref 0.0–0.2)

## 2019-01-16 LAB — ECHOCARDIOGRAM COMPLETE
Height: 64 in
Weight: 3284.8 oz

## 2019-01-16 LAB — SARS CORONAVIRUS 2 (TAT 6-24 HRS): SARS Coronavirus 2: NEGATIVE

## 2019-01-16 LAB — TROPONIN I (HIGH SENSITIVITY)
Troponin I (High Sensitivity): 6 ng/L (ref <18)
Troponin I (High Sensitivity): 7 ng/L (ref <18)
Troponin I (High Sensitivity): 9 ng/L (ref <18)

## 2019-01-16 LAB — BRAIN NATRIURETIC PEPTIDE: B Natriuretic Peptide: 92.4 pg/mL (ref 0.0–100.0)

## 2019-01-16 LAB — HEPARIN LEVEL (UNFRACTIONATED): Heparin Unfractionated: 0.5 IU/mL (ref 0.30–0.70)

## 2019-01-16 MED ORDER — LINACLOTIDE 145 MCG PO CAPS
290.0000 ug | ORAL_CAPSULE | Freq: Every day | ORAL | Status: DC
  Administered 2019-01-17: 290 ug via ORAL
  Filled 2019-01-16: qty 2

## 2019-01-16 MED ORDER — IOHEXOL 350 MG/ML SOLN
80.0000 mL | Freq: Once | INTRAVENOUS | Status: AC | PRN
  Administered 2019-01-16: 80 mL via INTRAVENOUS

## 2019-01-16 MED ORDER — ALUM & MAG HYDROXIDE-SIMETH 200-200-20 MG/5ML PO SUSP
30.0000 mL | Freq: Once | ORAL | Status: AC
  Administered 2019-01-16: 30 mL via ORAL
  Filled 2019-01-16: qty 30

## 2019-01-16 MED ORDER — NITROGLYCERIN 0.4 MG SL SUBL: 0.4000 mg | SUBLINGUAL_TABLET | SUBLINGUAL | Status: DC | PRN

## 2019-01-16 MED ORDER — HEPARIN BOLUS VIA INFUSION
4000.0000 [IU] | Freq: Once | INTRAVENOUS | Status: AC
  Administered 2019-01-16: 02:00:00 4000 [IU] via INTRAVENOUS
  Filled 2019-01-16: qty 4000

## 2019-01-16 MED ORDER — HEPARIN SODIUM (PORCINE) 5000 UNIT/ML IJ SOLN
5000.0000 [IU] | Freq: Three times a day (TID) | INTRAMUSCULAR | Status: DC
  Administered 2019-01-16 – 2019-01-17 (×2): 5000 [IU] via SUBCUTANEOUS
  Filled 2019-01-16 (×2): qty 1

## 2019-01-16 MED ORDER — NITROGLYCERIN 0.4 MG SL SUBL
SUBLINGUAL_TABLET | SUBLINGUAL | Status: AC
  Administered 2019-01-16: 0.4 mg via SUBLINGUAL
  Filled 2019-01-16: qty 1

## 2019-01-16 MED ORDER — ONDANSETRON HCL 4 MG/2ML IJ SOLN: 4.0000 mg | Freq: Four times a day (QID) | INTRAMUSCULAR | Status: DC | PRN

## 2019-01-16 MED ORDER — POLYETHYLENE GLYCOL 3350 17 G PO PACK
17.0000 g | PACK | Freq: Two times a day (BID) | ORAL | Status: DC
  Filled 2019-01-16: qty 1

## 2019-01-16 MED ORDER — ONDANSETRON HCL 4 MG/2ML IJ SOLN
4.0000 mg | Freq: Once | INTRAMUSCULAR | Status: AC
  Administered 2019-01-16: 01:00:00 4 mg via INTRAVENOUS

## 2019-01-16 MED ORDER — ACETAMINOPHEN 325 MG PO TABS: 650.0000 mg | ORAL_TABLET | Freq: Four times a day (QID) | ORAL | Status: DC | PRN

## 2019-01-16 MED ORDER — ASPIRIN EC 81 MG PO TBEC: 81.0000 mg | DELAYED_RELEASE_TABLET | Freq: Every day | ORAL | Status: DC

## 2019-01-16 MED ORDER — FAMOTIDINE IN NACL 20-0.9 MG/50ML-% IV SOLN
20.0000 mg | Freq: Once | INTRAVENOUS | Status: AC
  Administered 2019-01-16: 15:00:00 20 mg via INTRAVENOUS
  Filled 2019-01-16: qty 50

## 2019-01-16 MED ORDER — METOPROLOL TARTRATE 25 MG PO TABS
25.0000 mg | ORAL_TABLET | Freq: Two times a day (BID) | ORAL | Status: DC
  Administered 2019-01-16: 25 mg via ORAL
  Filled 2019-01-16 (×2): qty 1

## 2019-01-16 MED ORDER — MORPHINE SULFATE (PF) 4 MG/ML IV SOLN
4.0000 mg | Freq: Once | INTRAVENOUS | Status: AC
  Administered 2019-01-16: 4 mg via INTRAVENOUS
  Filled 2019-01-16: qty 1

## 2019-01-16 MED ORDER — ACETAMINOPHEN 325 MG PO TABS
650.0000 mg | ORAL_TABLET | ORAL | Status: DC | PRN
  Administered 2019-01-16: 650 mg via ORAL
  Filled 2019-01-16: qty 2

## 2019-01-16 MED ORDER — HYDROMORPHONE HCL 2 MG PO TABS
2.0000 mg | ORAL_TABLET | ORAL | Status: DC
  Administered 2019-01-16 – 2019-01-17 (×6): 2 mg via ORAL
  Filled 2019-01-16 (×6): qty 1

## 2019-01-16 MED ORDER — NITROGLYCERIN 0.4 MG SL SUBL
0.4000 mg | SUBLINGUAL_TABLET | Freq: Once | SUBLINGUAL | Status: AC
  Administered 2019-01-16: 11:00:00 0.4 mg via SUBLINGUAL

## 2019-01-16 MED ORDER — METHOCARBAMOL 500 MG PO TABS
500.0000 mg | ORAL_TABLET | Freq: Four times a day (QID) | ORAL | Status: DC | PRN
  Administered 2019-01-16: 500 mg via ORAL
  Filled 2019-01-16: qty 1

## 2019-01-16 MED ORDER — SUCRALFATE 1 GM/10ML PO SUSP
1.0000 g | Freq: Three times a day (TID) | ORAL | Status: DC
  Administered 2019-01-16 – 2019-01-17 (×3): 1 g via ORAL
  Filled 2019-01-16 (×3): qty 10

## 2019-01-16 MED ORDER — MORPHINE SULFATE (PF) 4 MG/ML IV SOLN
4.0000 mg | Freq: Once | INTRAVENOUS | Status: AC
  Administered 2019-01-16: 01:00:00 4 mg via INTRAVENOUS

## 2019-01-16 MED ORDER — METOPROLOL TARTRATE 50 MG PO TABS
50.0000 mg | ORAL_TABLET | Freq: Once | ORAL | Status: AC
  Administered 2019-01-16: 10:00:00 50 mg via ORAL
  Filled 2019-01-16: qty 1

## 2019-01-16 MED ORDER — NITROGLYCERIN IN D5W 200-5 MCG/ML-% IV SOLN
0.0000 ug/min | INTRAVENOUS | Status: DC
  Administered 2019-01-16: 5 ug/min via INTRAVENOUS
  Filled 2019-01-16: qty 250

## 2019-01-16 MED ORDER — ONDANSETRON HCL 4 MG/2ML IJ SOLN
INTRAMUSCULAR | Status: AC
  Filled 2019-01-16: qty 2

## 2019-01-16 MED ORDER — TRAZODONE HCL 100 MG PO TABS
200.0000 mg | ORAL_TABLET | Freq: Once | ORAL | Status: AC
  Administered 2019-01-16: 200 mg via ORAL
  Filled 2019-01-16: qty 2

## 2019-01-16 MED ORDER — HEPARIN (PORCINE) 25000 UT/250ML-% IV SOLN
1100.0000 [IU]/h | INTRAVENOUS | Status: DC
  Administered 2019-01-16: 1100 [IU]/h via INTRAVENOUS
  Filled 2019-01-16: qty 250

## 2019-01-16 MED ORDER — ALUM & MAG HYDROXIDE-SIMETH 200-200-20 MG/5ML PO SUSP: 30.0000 mL | Freq: Four times a day (QID) | ORAL | Status: DC | PRN

## 2019-01-16 MED ORDER — PANTOPRAZOLE SODIUM 40 MG IV SOLR
40.0000 mg | Freq: Two times a day (BID) | INTRAVENOUS | Status: DC
  Administered 2019-01-16: 40 mg via INTRAVENOUS
  Filled 2019-01-16: qty 40

## 2019-01-16 MED ORDER — MORPHINE SULFATE (PF) 2 MG/ML IV SOLN: 2.0000 mg | INTRAVENOUS | Status: DC | PRN

## 2019-01-16 MED ORDER — FAMOTIDINE IN NACL 20-0.9 MG/50ML-% IV SOLN
20.0000 mg | Freq: Two times a day (BID) | INTRAVENOUS | Status: DC
  Filled 2019-01-16: qty 50

## 2019-01-16 MED ORDER — MORPHINE SULFATE (PF) 4 MG/ML IV SOLN
INTRAVENOUS | Status: AC
  Filled 2019-01-16: qty 1

## 2019-01-16 NOTE — ED Notes (Signed)
Attempted to call report to [phone] 

## 2019-01-16 NOTE — Progress Notes (Signed)
   Primary Cardiologist Godfrey Pick Tobb  Subjective:   Some SSCP this am. Patient seen on CT table. ECG with J point elevation in 2,3 Troponin negative on iv heparin and nitro Has been beta blocked for cardiac CT  Objective:  Vitals:   01/16/19 0403 01/16/19 0503 01/16/19 0908 01/16/19 0940  BP: (!) 129/48 (!) 130/49 (!) 124/55   Pulse: (!) 59 (!) 54 (!) 57 60  Resp: 18 18    Temp:   98.2 F (36.8 C)   TempSrc:   Oral   SpO2: 99% 97% 100%   Weight:      Height:        Intake/Output from previous day:  Intake/Output Summary (Last 24 hours) at 01/16/2019 1025 Last data filed at 01/16/2019 0930 Gross per 24 hour  Intake 299.91 ml  Output 600 ml  Net -300.09 ml    Physical Exam:  Affect appropriate Healthy:  appears stated age HEENT: normal Neck supple with no adenopathy JVP normal no bruits no thyromegaly Lungs clear with no wheezing and good diaphragmatic motion Heart:  S1/S2 no murmur, no rub, gallop or click PMI normal Abdomen: benighn, BS positve, no tenderness, no AAA no bruit.  No HSM or HJR Distal pulses intact with no bruits No edema Neuro non-focal Skin warm and dry No muscular weakness   Lab Results: Basic Metabolic Panel: Recent Labs    01/15/19 2315 01/16/19 0331  NA 134* 138  K 3.7 3.7  CL 101 105  CO2 23 22  GLUCOSE 122* 119*  BUN 12 12  CREATININE 0.85 0.95  CALCIUM 9.2 9.0   Liver Function Tests: No results for input(s): AST, ALT, ALKPHOS, BILITOT, PROT, ALBUMIN in the last 72 hours. No results for input(s): LIPASE, AMYLASE in the last 72 hours. CBC: Recent Labs    01/15/19 2315  WBC 8.1  NEUTROABS 4.8  HGB 12.5  HCT 38.0  MCV 90.7  PLT 282    Imaging: Dg Chest Portable 1 View  Result Date: 01/16/2019 CLINICAL DATA:  Chest pain EXAM: PORTABLE CHEST 1 VIEW COMPARISON:  January 12, 2019 FINDINGS: The heart size and mediastinal contours are within normal limits. Aortic knob calcifications are seen. Both lungs are clear. No acute  osseous abnormality. Overlying recorder is seen. IMPRESSION: No acute cardiopulmonary process. Electronically Signed   By: Prudencio Pair M.D.   On: 01/16/2019 01:07    Cardiac Studies:  ECG: BS rate 52 J point elevation leads 2,3    Telemetry:  NSR 01/16/2019   Echo: pending   Medications:   . [START ON 01/17/2019] aspirin EC  81 mg Oral Daily  . metoprolol tartrate  25 mg Oral BID     . heparin 1,100 Units/hr (01/16/19 0700)  . nitroGLYCERIN 15 mcg/min (01/16/19 0700)    Assessment/Plan:   1. Chest Pain:  R/O ECG with pain this am J point elevation leads 2,3 For cardiac CT now continue heparin and nitro despite negative troponin do to recurrent pain. Also pending echo further recs depending on cardiac CT and echo CXR NAD and labs ok   2. HTN:  Well controlled.  Continue current medications and low sodium Dash type diet.    3. HLD:  Continue statin will have calcium score as part of cardiac CT   Jenkins Rouge 01/16/2019, 10:25 AM

## 2019-01-16 NOTE — Progress Notes (Signed)
ANTICOAGULATION CONSULT NOTE - Initial Consult  Pharmacy Consult for Heparin Indication: chest pain/ACS  Allergies  Allergen Reactions  . Ciprofloxacin Swelling    Tongue swells  . Cyclobenzaprine Swelling    Feet swell   . Pregabalin Anaphylaxis, Shortness Of Breath, Swelling and Other (See Comments)    Dizziness also  . Fluvastatin Other (See Comments)    Leg pain   . Meloxicam Swelling    Feet became swollen  . Metaxalone Rash and Other (See Comments)    Flushing of the skin and sore throat, too  . Pramipexole Nausea And Vomiting  . Pravastatin Other (See Comments)    Leg pain  . Rosuvastatin Other (See Comments)    Leg pain  . Sertraline Other (See Comments)    Sensitive teeth  . Statins Other (See Comments)    Leg pain     . Other     Darvocet  . Pravachol [Pravastatin Sodium]     Upper leg pain  . Amitriptyline Other (See Comments)    Weird feeling all over  . Arthrotec [Diclofenac-Misoprostol] Diarrhea  . Augmentin [Amoxicillin-Pot Clavulanate] Diarrhea    Has patient had a PCN reaction causing immediate rash, facial/tongue/throat swelling, SOB or lightheadedness with hypotension: No Has patient had a PCN reaction causing severe rash involving mucus membranes or skin necrosis: No Has patient had a PCN reaction that required hospitalization: No Has patient had a PCN reaction occurring within the last 10 years: No If all of the above answers are "NO", then may proceed with Cephalosporin use.   . Bromfenac Diarrhea and Nausea Only  . Cephalexin Diarrhea and Nausea Only  . Conj Estrog-Medroxyprogest Ace Other (See Comments)    Unsure of reaction type Couldn't tolerate  . Diclofenac-Misoprostol Nausea Only and Diarrhea  . Erythromycin Diarrhea and Nausea Only  . Esomeprazole Diarrhea  . Esomeprazole Magnesium Nausea Only  . Levofloxacin Other (See Comments) and Nausea Only    Stomach upset  . Methadone Diarrhea  . Metoclopramide Diarrhea and Nausea Only  .  Oxycodone Hives, Swelling and Rash  . Propoxyphene Diarrhea and Nausea Only  . Rofecoxib Diarrhea and Nausea Only  . Sulfur Diarrhea and Nausea Only  . Tramadol Diarrhea  . Zoloft [Sertraline Hcl] Other (See Comments)    Sensitivity with teeth    Patient Measurements:   Heparin Dosing Weight: 75 kg  Vital Signs: Temp: 98.2 F (36.8 C) (11/08 0012) Temp Source: Oral (11/08 0012) BP: 147/58 (11/08 0100) Pulse Rate: 66 (11/08 0100)  Labs: Recent Labs    01/15/19 2315  HGB 12.5  HCT 38.0  PLT 282  CREATININE 0.85  TROPONINIHS 6    Estimated Creatinine Clearance: 62.4 mL/min (by C-G formula based on SCr of 0.85 mg/dL).   Medical History: Past Medical History:  Diagnosis Date  . Abdominal pain   . Acute post-operative pain 07/13/2017  . Allergic rhinitis   . Anemia   . Arthritis   . Bipolar disorder ([place])    patient denies  . Cancer (HCC)    SKIN    ,    LEFT BREAST   . Chronic bilateral low back pain 09/16/2016  . Chronic constipation   . Chronic pain syndrome   . Constipation   . 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 Dry eyes   . Dysuria   . Elevated cholesterol   . 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 Gallstones   . GERD (gastroesophageal reflux disease)   . Glaucoma  both eyes  . Gout   . Hypertension   . IBS (irritable bowel syndrome)   . Idiopathic peripheral neuropathy 06/11/2015  . 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 Ingrown nail of great toe of right foot 05/04/2017  . Insomnia   . Intractable low back pain 07/13/2017  . Low back pain 08/27/2017  . Nausea without vomiting   . Obesity   . Pain syndrome, chronic 11/29/2015  . Paronychia of great toe, right 05/04/2017  . Postoperative anemia due to acute blood loss 08/29/2017  . 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 Radiculopathy 05/04/2014  . Radiculopathy of lumbar region 11/29/2015  . Risk for falls 06/11/2015  . Schizophrenia ([place])   . Sleep apnea    USES CPAP   . Sleep apnea,  obstructive 04/03/2015  . Urticaria   . Vitamin deficiency     Medications:  No current facility-administered medications on file prior to encounter.    Current Outpatient Medications on File Prior to Encounter  Medication Sig Dispense Refill  . alendronate (FOSAMAX) 70 MG tablet Take 70 mg by mouth every Thursday. Take with a full glass of water on an empty stomach.    Marland Kitchen allopurinol (ZYLOPRIM) 100 MG tablet Take 100 mg by mouth daily.    Marland Kitchen aspirin EC 81 MG tablet Take 81 mg by mouth daily.    . calcium carbonate (OSCAL) 1500 (600 Ca) MG TABS tablet Take 600 mg of elemental calcium by mouth 3 (three) times daily with meals.    . Cholecalciferol (VITAMIN D3) 2000 units TABS Take 1 tablet by mouth daily.    . cloNIDine (CATAPRES) 0.1 MG tablet Take 0.1 mg by mouth at bedtime.    . cycloSPORINE (RESTASIS) 0.05 % ophthalmic emulsion Place 1 drop into both eyes 4 (four) times daily.    . hydrochlorothiazide (HYDRODIURIL) 25 MG tablet Take 25 mg by mouth daily.    Marland Kitchen HYDROmorphone (DILAUDID) 2 MG tablet Take 2 mg by mouth every 4 (four) hours.    . Lactobacillus (PROBIOTIC ACIDOPHILUS PO) Take 1 tablet by mouth daily.    . lansoprazole (PREVACID) 30 MG capsule Take 30 mg by mouth daily before breakfast. 30 minutes before breakfast    . latanoprost (XALATAN) 0.005 % ophthalmic solution Place 1 drop into both eyes at bedtime.    Marland Kitchen linaclotide (LINZESS) 290 MCG CAPS capsule Take 290 mcg by mouth daily before breakfast.    . linaclotide (LINZESS) 290 MCG CAPS capsule Take 1 capsule (290 mcg total) by mouth daily before breakfast. 30 capsule 11  . LIVALO 2 MG TABS Take 1 tablet by mouth at bedtime.    . methocarbamol (ROBAXIN) 500 MG tablet Take 1 tablet (500 mg total) by mouth every 6 (six) hours as needed for muscle spasms (in between valium). 60 tablet 1  . nadolol (CORGARD) 40 MG tablet Take 40 mg by mouth at bedtime.    . Omega-3 Fatty Acids (FISH OIL) 600 MG CAPS Take 600 mg by mouth 2 (two)  times daily.    Marland Kitchen oxybutynin (DITROPAN) 5 MG tablet Take 5 mg by mouth 2 (two) times daily.    . polyethylene glycol (MIRALAX / GLYCOLAX) packet Take 17 g by mouth daily. 14 each 0  . spironolactone (ALDACTONE) 25 MG tablet Take 25 mg by mouth daily.    . timolol (BETIMOL) 0.5 % ophthalmic solution Place 1 drop into both eyes daily.    . traZODone (DESYREL) 100 MG tablet Take 200 mg by mouth  at bedtime.     Marland Kitchen trimethoprim (TRIMPEX) 100 MG tablet Take 100 mg by mouth daily.    . valsartan (DIOVAN) 80 MG tablet Take 80 mg by mouth daily.    . Wheat Dextrin (BENEFIBER) POWD Take 1 packet by mouth daily. Clear and Sugar FREE       Assessment: 78 y.o. female with chest pain for heparin  Goal of Therapy:  Heparin level 0.3-0.7 units/ml Monitor platelets by anticoagulation protocol: Yes   Plan:  Heparin 4000 units IV bolus, then start heparin 1100 units/hr Check heparin level in 6 hours.   Kimorah Ridolfi, [place] Curb 01/16/2019,1:45 AM

## 2019-01-16 NOTE — Progress Notes (Signed)
ANTICOAGULATION CONSULT NOTE - Follow Up Consult  Pharmacy Consult for heparin Indication: chest pain/ACS  Allergies  Allergen Reactions  . Ciprofloxacin Swelling    Tongue swells  . Cyclobenzaprine Swelling    Feet swell   . Pregabalin Anaphylaxis, Shortness Of Breath, Swelling and Other (See Comments)    Dizziness also  . Fluvastatin Other (See Comments)    Leg pain   . Meloxicam Swelling    Feet became swollen  . Metaxalone Rash and Other (See Comments)    Flushing of the skin and sore throat, too  . Pramipexole Nausea And Vomiting  . Pravastatin Other (See Comments)    Leg pain  . Rosuvastatin Other (See Comments)    Leg pain  . Sertraline Other (See Comments)    Sensitive teeth  . Statins Other (See Comments)    Leg pain     . Other     Darvocet  . Pravachol [Pravastatin Sodium]     Upper leg pain  . Amitriptyline Other (See Comments)    Weird feeling all over  . Arthrotec [Diclofenac-Misoprostol] Diarrhea  . Augmentin [Amoxicillin-Pot Clavulanate] Diarrhea    Has patient had a PCN reaction causing immediate rash, facial/tongue/throat swelling, SOB or lightheadedness with hypotension: No Has patient had a PCN reaction causing severe rash involving mucus membranes or skin necrosis: No Has patient had a PCN reaction that required hospitalization: No Has patient had a PCN reaction occurring within the last 10 years: No If all of the above answers are "NO", then may proceed with Cephalosporin use.   . Bromfenac Diarrhea and Nausea Only  . Cephalexin Diarrhea and Nausea Only  . Conj Estrog-Medroxyprogest Ace Other (See Comments)    Unsure of reaction type Couldn't tolerate  . Diclofenac-Misoprostol Nausea Only and Diarrhea  . Erythromycin Diarrhea and Nausea Only  . Esomeprazole Diarrhea  . Esomeprazole Magnesium Nausea Only  . Levofloxacin Other (See Comments) and Nausea Only    Stomach upset  . Methadone Diarrhea  . Metoclopramide Diarrhea and Nausea Only   . Oxycodone Hives, Swelling and Rash  . Propoxyphene Diarrhea and Nausea Only  . Rofecoxib Diarrhea and Nausea Only  . Sulfur Diarrhea and Nausea Only  . Tramadol Diarrhea  . Zoloft [Sertraline Hcl] Other (See Comments)    Sensitivity with teeth    Patient Measurements: Height: 5\' 4"  (162.6 cm) Weight: 205 lb 4.8 oz (93.1 kg) IBW/kg (Calculated) : 54.7 Heparin Dosing Weight: 75.8 kg   Vital Signs: Temp: 98.2 F (36.8 C) (11/08 0908) Temp Source: Oral (11/08 0908) BP: 124/55 (11/08 0908) Pulse Rate: 60 (11/08 0940)  Labs: Recent Labs    01/15/19 2315 01/16/19 0154 01/16/19 0331 01/16/19 0932  HGB 12.5  --   --   --   HCT 38.0  --   --   --   PLT 282  --   --   --   HEPARINUNFRC  --   --   --  0.50  CREATININE 0.85  --  0.95  --   TROPONINIHS 6 7  --   --     Estimated Creatinine Clearance: 55.8 mL/min (by C-G formula based on SCr of 0.95 mg/dL).   Medications:  Scheduled:  . [START ON 01/17/2019] aspirin EC  81 mg Oral Daily  . metoprolol tartrate  25 mg Oral BID   Infusions:  . heparin 1,100 Units/hr (01/16/19 0700)  . nitroGLYCERIN 15 mcg/min (01/16/19 0700)    Assessment: 78 yo female presented on 01/15/2019 with  chest pain and pharmacy was consulted to dose heparin for ACS. Of note the patient presented to the ED on 01/12/2019 with chest pain and was seen as an outpatient by cardiology on 01/14/2019 for chest pain and palpitations and planned for outpatient coronary CTA.   Troponins negative upon admission. Heparin level of 0.50 is therapeutic on heparin 1100 units/hr. CBC stable. No reported bleeding. Cardiology plans to continue heparin and obtain coronary CTA.  Goal of Therapy:  Heparin level 0.3-0.7 units/ml Monitor platelets by anticoagulation protocol: Yes   Plan:  Continue heparin 1100 units/hr  Check heparin level at 1800 Follow up results of coronary CTA and plan for heparin  Monitor daily heparin level, CBC, and S/S of bleeding    Cristela Felt, PharmD PGY1 Pharmacy Resident [employer]: [phone]  01/16/2019,10:05 AM

## 2019-01-16 NOTE — Progress Notes (Signed)
*  PRELIMINARY RESULTS* Echocardiogram 2D Echocardiogram has been performed.  Karen Castillo 01/16/2019, 3:07 PM

## 2019-01-16 NOTE — Consult Note (Signed)
CARDIOLOGY CONSULT NOTE   Referring Physician: Dr Starla Link Primary Physician: Dr Delena Bali Primary Cardiologist: Dr Harriet Masson Reason for Consultation: Chest pain   HPI: Karen Castillo is a 78 y.o. female w history of HTN, HLD who presents with chest pain.   Briefly, the patient describes new onset chest pain several days ago while at rest. She came to the ED for this and was found to have normal troponin x 2 and a normal ECG. Her pain responded to nitroglycerin in the ambulance and she remained chest pain free during the remainder of her ED course, so she was discharged home with cardiology follow up. She was seen by Dr. Harriet Masson in cardiology clinic on 11/6 for chest pain and a several month history of palpitations. She had a Zio patch placed and was scheduled to undergo coronary CTA in the outpatient setting.   Yesterday however the patient again developed chest pain while at rest similar to her episode from several days ago. The pain was constant for several hours. She took aspirin and came to the ED for evaluation. In the ED, her pain this time did not respond to sublingual nitroglycerin. She was found to have normal labs (including normal hsTnI x2) and a normal ECG without evidence of acute ischemia. She was started on a heparin drip and a nitro drip and admitted to the medical service for evaluation.   Notably the patient denies any personal or family history of cardiovascular disease. She is a never smoker. She has had many back surgeries and has persistent back pain, thus she is unable to do any regular physical activity other than ADL's and IADL's around the house.   Review of Systems:     Cardiac Review of Systems: {Y] = yes [ ]  = no  Chest Pain [ X  ]  Resting SOB [   ] Exertional SOB  [  ]  Orthopnea [  ]   Pedal Edema [   ]    Palpitations [  ] Syncope  [  ]   Presyncope [   ]  General Review of Systems: [Y] = yes [  ]=no Constitional: recent weight change [  ]; anorexia [  ]; fatigue [  ];  nausea [  ]; night sweats [  ]; fever [  ]; or chills [  ];                                                                     Eyes : blurred vision [  ]; diplopia [   ]; vision changes [  ];  Amaurosis fugax[  ]; Resp: cough [  ];  wheezing[  ];  hemoptysis[  ];  PND [  ];  GI:  gallstones[  ], vomiting[  ];  dysphagia[  ]; melena[  ];  hematochezia [  ]; heartburn[  ];   GU: kidney stones [  ]; hematuria[  ];   dysuria [  ];  nocturia[  ]; incontinence [  ];             Skin: rash, swelling[  ];, hair loss[  ];  peripheral edema[  ];  or itching[  ]; Musculosketetal: myalgias[  ];  joint swelling[  ];  joint erythema[  ];  joint pain[  ];  back pain[  ];  Heme/Lymph: bruising[  ];  bleeding[  ];  anemia[  ];  Neuro: TIA[  ];  headaches[  ];  stroke[  ];  vertigo[  ];  seizures[  ];   paresthesias[  ];  difficulty walking[  ];  Psych:depression[  ]; anxiety[  ];  Endocrine: diabetes[  ];  thyroid dysfunction[  ];  Other:  Past Medical History:  Diagnosis Date   Abdominal pain    Acute post-operative pain 07/13/2017   Allergic rhinitis    Anemia    Arthritis    Bipolar disorder ([place])    patient denies   Cancer ([place])    SKIN    ,    LEFT BREAST    Chronic bilateral low back pain 09/16/2016   Chronic constipation    Chronic pain syndrome    Constipation    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   Dry eyes    Dysuria    Elevated cholesterol    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   Gallstones    GERD (gastroesophageal reflux disease)    Glaucoma    both eyes   Gout    Hypertension    IBS (irritable bowel syndrome)    Idiopathic peripheral neuropathy 06/11/2015   Infection of lumbar spine (HCC)    Ingrown nail of great toe of right foot 05/04/2017   Insomnia    Intractable low back pain 07/13/2017   Low back pain 08/27/2017   Nausea without vomiting    Obesity    Pain syndrome, chronic 11/29/2015   Paronychia of great toe, right 05/04/2017    Postoperative anemia due to acute blood loss 08/29/2017   Psychophysiological insomnia 04/03/2015   Radiculopathy 05/04/2014   Radiculopathy of lumbar region 11/29/2015   Risk for falls 06/11/2015   Schizophrenia ([place])    Sleep apnea    USES CPAP    Sleep apnea, obstructive 04/03/2015   Urticaria    Vitamin deficiency     Medications Prior to Admission  Medication Sig Dispense Refill   alendronate (FOSAMAX) 70 MG tablet Take 70 mg by mouth every Thursday. Take with a full glass of water on an empty stomach.     allopurinol (ZYLOPRIM) 100 MG tablet Take 100 mg by mouth daily.     aspirin EC 81 MG tablet Take 81 mg by mouth daily.     calcium carbonate (OSCAL) 1500 (600 Ca) MG TABS tablet Take 600 mg of elemental calcium by mouth 3 (three) times daily with meals.     Cholecalciferol (VITAMIN D3) 2000 units TABS Take 1 tablet by mouth daily.     cloNIDine (CATAPRES) 0.1 MG tablet Take 0.1 mg by mouth at bedtime.     cycloSPORINE (RESTASIS) 0.05 % ophthalmic emulsion Place 1 drop into both eyes 4 (four) times daily.     hydrochlorothiazide (HYDRODIURIL) 25 MG tablet Take 25 mg by mouth daily.     HYDROmorphone (DILAUDID) 2 MG tablet Take 2 mg by mouth every 4 (four) hours.     Lactobacillus (PROBIOTIC ACIDOPHILUS PO) Take 1 tablet by mouth daily.     lansoprazole (PREVACID) 30 MG capsule Take 30 mg by mouth daily before breakfast. 30 minutes before breakfast     latanoprost (XALATAN) 0.005 % ophthalmic solution Place 1 drop into both eyes at bedtime.     linaclotide (LINZESS) 290 MCG CAPS capsule Take 290 mcg by mouth daily before breakfast.  linaclotide (LINZESS) 290 MCG CAPS capsule Take 1 capsule (290 mcg total) by mouth daily before breakfast. 30 capsule 11   LIVALO 2 MG TABS Take 1 tablet by mouth at bedtime.     methocarbamol (ROBAXIN) 500 MG tablet Take 1 tablet (500 mg total) by mouth every 6 (six) hours as needed for muscle spasms (in between valium). 60 tablet  1   nadolol (CORGARD) 40 MG tablet Take 40 mg by mouth at bedtime.     Omega-3 Fatty Acids (FISH OIL) 600 MG CAPS Take 600 mg by mouth 2 (two) times daily.     oxybutynin (DITROPAN) 5 MG tablet Take 5 mg by mouth 2 (two) times daily.     polyethylene glycol (MIRALAX / GLYCOLAX) packet Take 17 g by mouth daily. 14 each 0   spironolactone (ALDACTONE) 25 MG tablet Take 25 mg by mouth daily.     timolol (BETIMOL) 0.5 % ophthalmic solution Place 1 drop into both eyes daily.     traZODone (DESYREL) 100 MG tablet Take 200 mg by mouth at bedtime.      trimethoprim (TRIMPEX) 100 MG tablet Take 100 mg by mouth daily.     valsartan (DIOVAN) 80 MG tablet Take 80 mg by mouth daily.     Wheat Dextrin (BENEFIBER) POWD Take 1 packet by mouth daily. Clear and Sugar FREE        [START ON 01/17/2019] aspirin EC  81 mg Oral Daily   metoprolol tartrate  25 mg Oral BID    Infusions:  heparin 1,100 Units/hr (01/16/19 0500)   nitroGLYCERIN 15 mcg/min (01/16/19 0500)    Allergies  Allergen Reactions   Ciprofloxacin Swelling    Tongue swells   Cyclobenzaprine Swelling    Feet swell    Pregabalin Anaphylaxis, Shortness Of Breath, Swelling and Other (See Comments)    Dizziness also   Fluvastatin Other (See Comments)    Leg pain    Meloxicam Swelling    Feet became swollen   Metaxalone Rash and Other (See Comments)    Flushing of the skin and sore throat, too   Pramipexole Nausea And Vomiting   Pravastatin Other (See Comments)    Leg pain   Rosuvastatin Other (See Comments)    Leg pain   Sertraline Other (See Comments)    Sensitive teeth   Statins Other (See Comments)    Leg pain      Other     Darvocet   Pravachol [Pravastatin Sodium]     Upper leg pain   Amitriptyline Other (See Comments)    Weird feeling all over   Arthrotec [Diclofenac-Misoprostol] Diarrhea   Augmentin [Amoxicillin-Pot Clavulanate] Diarrhea    Has patient had a PCN reaction causing  immediate rash, facial/tongue/throat swelling, SOB or lightheadedness with hypotension: No Has patient had a PCN reaction causing severe rash involving mucus membranes or skin necrosis: No Has patient had a PCN reaction that required hospitalization: No Has patient had a PCN reaction occurring within the last 10 years: No If all of the above answers are "NO", then may proceed with Cephalosporin use.    Bromfenac Diarrhea and Nausea Only   Cephalexin Diarrhea and Nausea Only   Conj Estrog-Medroxyprogest Ace Other (See Comments)    Unsure of reaction type Couldn't tolerate   Diclofenac-Misoprostol Nausea Only and Diarrhea   Erythromycin Diarrhea and Nausea Only   Esomeprazole Diarrhea   Esomeprazole Magnesium Nausea Only   Levofloxacin Other (See Comments) and Nausea Only    Stomach  upset   Methadone Diarrhea   Metoclopramide Diarrhea and Nausea Only   Oxycodone Hives, Swelling and Rash   Propoxyphene Diarrhea and Nausea Only   Rofecoxib Diarrhea and Nausea Only   Sulfur Diarrhea and Nausea Only   Tramadol Diarrhea   Zoloft [Sertraline Hcl] Other (See Comments)    Sensitivity with teeth    Social History   Socioeconomic History   Marital status: Widowed    Spouse name: Not on file   Number of children: 2   Years of education: Not on file   Highest education level: Not on file  Occupational History   Occupation: Retired  Scientist, product/process development strain: Not on file   Food insecurity    Worry: Not on file    Inability: Not on Lexicographer needs    Medical: Not on file    Non-medical: Not on file  Tobacco Use   Smoking status: Never Smoker   Smokeless tobacco: Never Used  Substance and Sexual Activity   Alcohol use: No   Drug use: No   Sexual activity: Not on file  Lifestyle   Physical activity    Days per week: Not on file    Minutes per session: Not on file   Stress: Not on file  Relationships   Social  connections    Talks on phone: Not on file    Gets together: Not on file    Attends religious service: Not on file    Active member of club or organization: Not on file    Attends meetings of clubs or organizations: Not on file    Relationship status: Not on file   Intimate partner violence    Fear of current or ex partner: Not on file    Emotionally abused: Not on file    Physically abused: Not on file    Forced sexual activity: Not on file  Other Topics Concern   Not on file  Social History Narrative   Not on file    Family History  Problem Relation Age of Onset   Diabetes Mother        had a history but not anymore   Prostate cancer Father    Stroke Sister        has a brain tumor   Colon cancer Neg Hx    Esophageal cancer Neg Hx     PHYSICAL EXAM: Vitals:   01/16/19 0403 01/16/19 0503  BP: (!) 129/48 (!) 130/49  Pulse: (!) 59 (!) 54  Resp: 18 18  Temp:    SpO2: 99% 97%     Intake/Output Summary (Last 24 hours) at 01/16/2019 0557 Last data filed at 01/16/2019 0500 Gross per 24 hour  Intake 84.91 ml  Output --  Net 84.91 ml    General:  Pleasant, NAD HEENT: normal Neck: no JVD Cor: PMI nondisplaced. Regular rate & rhythm. No rubs, gallops or murmurs. Lungs: clear Abdomen: soft, nontender, nondistended. No hepatosplenomegaly. No bruits or masses. Good bowel sounds. Extremities: no cyanosis, clubbing, rash, edema Neuro: alert & oriented x 3, cranial nerves grossly intact. moves all 4 extremities w/o difficulty. Affect pleasant.  ECG: NSR, HR 58, normal axis, normal intervals, normal morphologies, no ST or T wave changes suggestive of ischemia, no Q waves, normal ECG  Results for orders placed or performed during the hospital encounter of 01/15/19 (from the past 24 hour(s))  CBC with Differential     Status: None   Collection Time:  01/15/19 11:15 PM  Result Value Ref Range   WBC 8.1 4.0 - 10.5 K/uL   RBC 4.19 3.87 - 5.11 MIL/uL   Hemoglobin 12.5  12.0 - 15.0 g/dL   HCT 38.0 36.0 - 46.0 %   MCV 90.7 80.0 - 100.0 fL   MCH 29.8 26.0 - 34.0 pg   MCHC 32.9 30.0 - 36.0 g/dL   RDW 12.7 11.5 - 15.5 %   Platelets 282 150 - 400 K/uL   nRBC 0.0 0.0 - 0.2 %   Neutrophils Relative % 59 %   Neutro Abs 4.8 1.7 - 7.7 K/uL   Lymphocytes Relative 30 %   Lymphs Abs 2.5 0.7 - 4.0 K/uL   Monocytes Relative 7 %   Monocytes Absolute 0.6 0.1 - 1.0 K/uL   Eosinophils Relative 3 %   Eosinophils Absolute 0.2 0.0 - 0.5 K/uL   Basophils Relative 1 %   Basophils Absolute 0.0 0.0 - 0.1 K/uL   Immature Granulocytes 0 %   Abs Immature Granulocytes 0.03 0.00 - 0.07 K/uL  Basic metabolic panel     Status: Abnormal   Collection Time: 01/15/19 11:15 PM  Result Value Ref Range   Sodium 134 (L) 135 - 145 mmol/L   Potassium 3.7 3.5 - 5.1 mmol/L   Chloride 101 98 - 111 mmol/L   CO2 23 22 - 32 mmol/L   Glucose, Bld 122 (H) 70 - 99 mg/dL   BUN 12 8 - 23 mg/dL   Creatinine, Ser 0.85 0.44 - 1.00 mg/dL   Calcium 9.2 8.9 - 10.3 mg/dL   GFR calc non Af Amer >60 >60 mL/min   GFR calc Af Amer >60 >60 mL/min   Anion gap 10 5 - 15  Troponin I (High Sensitivity)     Status: None   Collection Time: 01/15/19 11:15 PM  Result Value Ref Range   Troponin I (High Sensitivity) 6 <18 ng/L  Brain natriuretic peptide     Status: None   Collection Time: 01/15/19 11:15 PM  Result Value Ref Range   B Natriuretic Peptide 92.4 0.0 - 100.0 pg/mL  SARS CORONAVIRUS 2 (TAT 6-24 HRS) Nasopharyngeal Nasopharyngeal Swab     Status: None   Collection Time: 01/15/19 11:53 PM   Specimen: Nasopharyngeal Swab  Result Value Ref Range   SARS Coronavirus 2 NEGATIVE NEGATIVE  Troponin I (High Sensitivity)     Status: None   Collection Time: 01/16/19  1:54 AM  Result Value Ref Range   Troponin I (High Sensitivity) 7 <18 ng/L  Basic metabolic panel     Status: Abnormal   Collection Time: 01/16/19  3:31 AM  Result Value Ref Range   Sodium 138 135 - 145 mmol/L   Potassium 3.7 3.5 - 5.1  mmol/L   Chloride 105 98 - 111 mmol/L   CO2 22 22 - 32 mmol/L   Glucose, Bld 119 (H) 70 - 99 mg/dL   BUN 12 8 - 23 mg/dL   Creatinine, Ser 0.95 0.44 - 1.00 mg/dL   Calcium 9.0 8.9 - 10.3 mg/dL   GFR calc non Af Amer 58 (L) >60 mL/min   GFR calc Af Amer >60 >60 mL/min   Anion gap 11 5 - 15   Dg Chest Portable 1 View  Result Date: 01/16/2019 CLINICAL DATA:  Chest pain EXAM: PORTABLE CHEST 1 VIEW COMPARISON:  January 12, 2019 FINDINGS: The heart size and mediastinal contours are within normal limits. Aortic knob calcifications are seen. Both lungs are clear.  No acute osseous abnormality. Overlying recorder is seen. IMPRESSION: No acute cardiopulmonary process. Electronically Signed   By: Prudencio Pair M.D.   On: 01/16/2019 01:07    ASSESSMENT: Karen Castillo is a 78 y.o. female w history of HTN, HLD who presents with chest pain. Patient has a normal appearing ECG and normal high sensitivity cardiac troponins, however she does have several risk factors for coronary disease and a somewhat concerning story. She is intermediate risk for coronary disease and thus proceeding with non-invasive evaluation is reasonable.   PLAN/DISCUSSION: #) Chest pain - coronary CTA ordered  - check lipids, A1c - cont ASA 81mg  daily - ok to continue heparin drip for now - it does not seem as though the nitroglycerin drip is helping with her chest pain, thus this can be stopped  - agree with starting beta blocker, may be able to stop this if coronary CT is normal given that her BP's after arriving to the inpatient floor have been quite good - will have to discuss whether statin therapy is indicated pending lipid panel, ASCVD risk score, and results of coronary CTA - close monitoring of blood pressure and cardiac rhythm  Marcie Mowers, MD Cardiology Fellow, PGY-7

## 2019-01-16 NOTE — Progress Notes (Signed)
Patient still complains of "pressure" despite r/o and no acute ECG changes Cardiac CT with very high calcium socre over 1200 95 th percentile Moderate appearing mid RCA and proximal LAD disease  Study sent for FFR CT Also still needs echo  Hopefully both of these results will be available by tomorrow Will keep NPO in am in case FFR CT shows flow limiting lesion Discussed with patient Will keep on heparin / nitro for now   [employer]

## 2019-01-16 NOTE — ED Notes (Signed)
Attempt to call report to [facility]

## 2019-01-16 NOTE — H&P (Signed)
History and Physical    Karen Castillo [id number] DOB: 10-01-1940 DOA: 01/15/2019  PCP: Nicoletta Dress, MD   Patient coming from: Home   Chief Complaint: Chest pain   HPI: Karen Castillo is a 78 y.o. female with medical history significant for chronic back pain status post L5-S1 fusion, hypertension, and OSA on CPAP, now presenting to the emergency department for evaluation of chest pain.  Patient reports a several month history of palpitations, she had an episode of chest pain while at rest late at night on 01/11/2019 with negative troponins and reassuring EKG in the emergency department, pain resolved and was discharged back home, saw cardiology in the clinic on 01/14/2019, had Zio patch placed for evaluation of palpitations, and was scheduled for coronary CT.  This afternoon, she developed chest pressure while at rest, this remained constant and fairly mild for several hours, but has since become more intense.  There has not been any associated shortness of breath, cough, nausea, vomiting, leg swelling, or leg tenderness.  She took a full dose aspirin prior to coming into the ED.  ED Course: Upon arrival to the ED, patient is found to be afebrile, saturating well on room air, and with stable blood pressure.  EKG features a sinus rhythm and chest x-ray is negative for acute cardiopulmonary disease.  Chemistry panel and CBC are unremarkable.  High-sensitivity troponin and BNP are normal.  Patient continued to have pain after sublingual nitroglycerin, was started on IV heparin infusion, nitroglycerin infusion, and given 2 doses of IV morphine.  Cardiology was consulted by the ED physician and recommended medical admission.  Review of Systems:  All other systems reviewed and apart from HPI, are negative.  Past Medical History:  Diagnosis Date  . Abdominal pain   . Acute post-operative pain 07/13/2017  . Allergic rhinitis   . Anemia   . Arthritis   . Bipolar disorder ([place])    patient denies   . Cancer (HCC)    SKIN    ,    LEFT BREAST   . Chronic bilateral low back pain 09/16/2016  . Chronic constipation   . Chronic pain syndrome   . Constipation   . 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 Dry eyes   . Dysuria   . Elevated cholesterol   . 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 Gallstones   . GERD (gastroesophageal reflux disease)   . Glaucoma    both eyes  . Gout   . Hypertension   . IBS (irritable bowel syndrome)   . Idiopathic peripheral neuropathy 06/11/2015  . 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 Ingrown nail of great toe of right foot 05/04/2017  . Insomnia   . Intractable low back pain 07/13/2017  . Low back pain 08/27/2017  . Nausea without vomiting   . Obesity   . Pain syndrome, chronic 11/29/2015  . Paronychia of great toe, right 05/04/2017  . Postoperative anemia due to acute blood loss 08/29/2017  . 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 Radiculopathy 05/04/2014  . Radiculopathy of lumbar region 11/29/2015  . Risk for falls 06/11/2015  . Schizophrenia ([place])   . Sleep apnea    USES CPAP   . Sleep apnea, obstructive 04/03/2015  . Urticaria   . Vitamin deficiency     Past Surgical History:  Procedure Laterality Date  . ABDOMINAL EXPOSURE N/A 05/04/2014   Procedure: ABDOMINAL EXPOSURE;  Surgeon: Serafina Mitchell, MD;  Location: [facility];  Service: Vascular;  Laterality: N/A;  . ANTERIOR LUMBAR FUSION N/A 05/04/2014   Procedure: ANTERIOR LUMBAR FUSION 1 LEVEL;  Surgeon: Sinclair Ship, MD;  Location: [facility];  Service: Orthopedics;  Laterality: N/A;  Lumbar 5-sacrum 1 anterior lumbar interbody fusion with instrumentation and allograft  . ANTERIOR LUMBAR FUSION  07/2017  . ANTERIOR LUMBAR FUSION  08/2017  . BACK SURGERY     2010 ,2015LUMB FUSION, 06/2013 [facility] FUSION  . BACK SURGERY  07/2017   Lumbar  . BREAST BIOPSY  2013   pt said it was positive   . BREAST SURGERY     LEFT BRREAST BX  . CARPAL TUNNEL RELEASE     1994 RT  . CARPAL TUNNEL RELEASE  Bilateral   . CATARACT EXTRACTION, BILATERAL  2017  . CHOLECYSTECTOMY     2004  . COLONOSCOPY W/ BIOPSIES     Colonoscopy AO:[id number], 2009, 2011  . ERCP     2009  . ESOPHAGOGASTRODUODENOSCOPY  08/09/2017   Small hiatal hernia. Mild gastritis. Incidenta; duodenal diverticulum. Incidental gastric polyp.   [facility] LEEP     1992 and 1994  . SKIN CANCER EXCISION     X 4   . thorocolumbar fusion  06/2013  . TUBAL LIGATION       reports that she has never smoked. She has never used smokeless tobacco. She reports that she does not drink alcohol or use drugs.  Allergies  Allergen Reactions  . Ciprofloxacin Swelling    Tongue swells  . Cyclobenzaprine Swelling    Feet swell   . Pregabalin Anaphylaxis, Shortness Of Breath, Swelling and Other (See Comments)    Dizziness also  . Fluvastatin Other (See Comments)    Leg pain   . Meloxicam Swelling    Feet became swollen  . Metaxalone Rash and Other (See Comments)    Flushing of the skin and sore throat, too  . Pramipexole Nausea And Vomiting  . Pravastatin Other (See Comments)    Leg pain  . Rosuvastatin Other (See Comments)    Leg pain  . Sertraline Other (See Comments)    Sensitive teeth  . Statins Other (See Comments)    Leg pain     . Other     Darvocet  . Pravachol [Pravastatin Sodium]     Upper leg pain  . Amitriptyline Other (See Comments)    Weird feeling all over  . Arthrotec [Diclofenac-Misoprostol] Diarrhea  . Augmentin [Amoxicillin-Pot Clavulanate] Diarrhea    Has patient had a PCN reaction causing immediate rash, facial/tongue/throat swelling, SOB or lightheadedness with hypotension: No Has patient had a PCN reaction causing severe rash involving mucus membranes or skin necrosis: No Has patient had a PCN reaction that required hospitalization: No Has patient had a PCN reaction occurring within the last 10 years: No If all of the above answers are "NO", then may proceed with Cephalosporin use.   . Bromfenac Diarrhea  and Nausea Only  . Cephalexin Diarrhea and Nausea Only  . Conj Estrog-Medroxyprogest Ace Other (See Comments)    Unsure of reaction type Couldn't tolerate  . Diclofenac-Misoprostol Nausea Only and Diarrhea  . Erythromycin Diarrhea and Nausea Only  . Esomeprazole Diarrhea  . Esomeprazole Magnesium Nausea Only  . Levofloxacin Other (See Comments) and Nausea Only    Stomach upset  . Methadone Diarrhea  . Metoclopramide Diarrhea and Nausea Only  . Oxycodone Hives, Swelling and Rash  . Propoxyphene Diarrhea and Nausea Only  . Rofecoxib Diarrhea and Nausea Only  . Sulfur  Diarrhea and Nausea Only  . Tramadol Diarrhea  . Zoloft [Sertraline Hcl] Other (See Comments)    Sensitivity with teeth    Family History  Problem Relation Age of Onset  . Diabetes Mother        had a history but not anymore  . Prostate cancer Father   . Stroke Sister        has a brain tumor  . Colon cancer Neg Hx   . Esophageal cancer Neg Hx      Prior to Admission medications   Medication Sig Start Date End Date Taking? Authorizing Provider  alendronate (FOSAMAX) 70 MG tablet Take 70 mg by mouth every Thursday. Take with a full glass of water on an empty stomach.    [provider]  allopurinol (ZYLOPRIM) 100 MG tablet Take 100 mg by mouth daily.    [provider]  aspirin EC 81 MG tablet Take 81 mg by mouth daily.    [provider]  calcium carbonate (OSCAL) 1500 (600 Ca) MG TABS tablet Take 600 mg of elemental calcium by mouth 3 (three) times daily with meals.    [provider]  Cholecalciferol (VITAMIN D3) 2000 units TABS Take 1 tablet by mouth daily.    [provider]  cloNIDine (CATAPRES) 0.1 MG tablet Take 0.1 mg by mouth at bedtime.    [provider]  cycloSPORINE (RESTASIS) 0.05 % ophthalmic emulsion Place 1 drop into both eyes 4 (four) times daily.    [provider]  hydrochlorothiazide (HYDRODIURIL) 25 MG tablet Take 25 mg by mouth  daily. 01/14/19   [provider]  HYDROmorphone (DILAUDID) 2 MG tablet Take 2 mg by mouth every 4 (four) hours.    [provider]  Lactobacillus (PROBIOTIC ACIDOPHILUS PO) Take 1 tablet by mouth daily.    [provider]  lansoprazole (PREVACID) 30 MG capsule Take 30 mg by mouth daily before breakfast. 30 minutes before breakfast    [provider]  latanoprost (XALATAN) 0.005 % ophthalmic solution Place 1 drop into both eyes at bedtime.    [provider]  linaclotide (LINZESS) 290 MCG CAPS capsule Take 290 mcg by mouth daily before breakfast.    [provider]  linaclotide (LINZESS) 290 MCG CAPS capsule Take 1 capsule (290 mcg total) by mouth daily before breakfast. 11/27/17   Jackquline Denmark, MD  LIVALO 2 MG TABS Take 1 tablet by mouth at bedtime. 12/27/18   [provider]  methocarbamol (ROBAXIN) 500 MG tablet Take 1 tablet (500 mg total) by mouth every 6 (six) hours as needed for muscle spasms (in between valium). 09/01/17   McKenzie, Lennie Muckle, PA-C  nadolol (CORGARD) 40 MG tablet Take 40 mg by mouth at bedtime.    [provider]  Omega-3 Fatty Acids (FISH OIL) 600 MG CAPS Take 600 mg by mouth 2 (two) times daily.    [provider]  oxybutynin (DITROPAN) 5 MG tablet Take 5 mg by mouth 2 (two) times daily.    [provider]  polyethylene glycol (MIRALAX / GLYCOLAX) packet Take 17 g by mouth daily. 07/23/17   Kayleen Memos, DO  spironolactone (ALDACTONE) 25 MG tablet Take 25 mg by mouth daily.    [provider]  timolol (BETIMOL) 0.5 % ophthalmic solution Place 1 drop into both eyes daily.    [provider]  traZODone (DESYREL) 100 MG tablet Take 200 mg by mouth at bedtime.     [provider]  trimethoprim (TRIMPEX) 100 MG tablet Take 100 mg by mouth daily.    [provider]  valsartan (DIOVAN) 80 MG tablet Take 80 mg by mouth daily.    [provider]   Wheat Dextrin (BENEFIBER) POWD Take 1 packet by mouth daily. Clear and Sugar FREE    [provider]    Physical Exam: Vitals:   01/16/19 0012 01/16/19 0015 01/16/19 0030 01/16/19 0100  BP: (!) 154/53 (!) 163/59 (!) 152/65 (!) 147/58  Pulse: 61 65 70 66  Resp: 16 14 16 11   Temp: 98.2 F (36.8 C)     TempSrc: Oral     SpO2: 97% 97% 99% 96%    Constitutional: NAD, calm  Eyes: PERTLA, lids and conjunctivae normal ENMT: Mucous membranes are moist. Posterior pharynx clear of any exudate or lesions.   Neck: normal, supple, no masses, no thyromegaly Respiratory: no wheezing, no crackles. Normal respiratory effort. No accessory muscle use.  Cardiovascular: S1 & S2 heard, regular rate and rhythm. No extremity edema.  Abdomen: No distension, no tenderness, soft. Bowel sounds normal.  Musculoskeletal: no clubbing / cyanosis. No joint deformity upper and lower extremities.    Skin: no significant rashes, lesions, ulcers. Warm, dry, well-perfused. Neurologic: No facial asymmetry. Sensation intact. Moving all extremities.  Psychiatric: Alert and oriented to person, place, and situation. Pleasant, cooperative.     Labs on Admission: I have personally reviewed following labs and imaging studies  CBC: Recent Labs  Lab 01/12/19 0138 01/15/19 2315  WBC 7.9 8.1  NEUTROABS  --  4.8  HGB 12.4 12.5  HCT 37.5 38.0  MCV 90.6 90.7  PLT 280 [id number]   Basic Metabolic Panel: Recent Labs  Lab 01/12/19 0138 01/15/19 2315  NA 134* 134*  K 3.7 3.7  CL 97* 101  CO2 25 23  GLUCOSE 133* 122*  BUN 15 12  CREATININE 1.00 0.85  CALCIUM 9.6 9.2   GFR: Estimated Creatinine Clearance: 62.4 mL/min (by C-G formula based on SCr of 0.85 mg/dL). Liver Function Tests: No results for input(s): AST, ALT, ALKPHOS, BILITOT, PROT, ALBUMIN in the last 168 hours. Recent Labs  Lab 01/12/19 0500  LIPASE 39   No results for input(s): AMMONIA in the last 168 hours. Coagulation Profile: No results for  input(s): INR, PROTIME in the last 168 hours. Cardiac Enzymes: No results for input(s): CKTOTAL, CKMB, CKMBINDEX, TROPONINI in the last 168 hours. BNP (last 3 results) No results for input(s): PROBNP in the last 8760 hours. HbA1C: No results for input(s): HGBA1C in the last 72 hours. CBG: No results for input(s): GLUCAP in the last 168 hours. Lipid Profile: No results for input(s): CHOL, HDL, LDLCALC, TRIG, CHOLHDL, LDLDIRECT in the last 72 hours. Thyroid Function Tests: No results for input(s): TSH, T4TOTAL, FREET4, T3FREE, THYROIDAB in the last 72 hours. Anemia Panel: No results for input(s): VITAMINB12, FOLATE, FERRITIN, TIBC, IRON, RETICCTPCT in the last 72 hours. Urine analysis:    Component Value Date/Time   COLORURINE YELLOW 07/20/2017 1600   APPEARANCEUR CLEAR 07/20/2017 1600   LABSPEC 1.014 07/20/2017 1600   PHURINE 5.0 07/20/2017 1600   GLUCOSEU 150 (A) 07/20/2017 1600   HGBUR NEGATIVE 07/20/2017 1600   BILIRUBINUR NEGATIVE 07/20/2017 1600   KETONESUR NEGATIVE 07/20/2017 1600   PROTEINUR NEGATIVE 07/20/2017 1600   UROBILINOGEN 0.2 04/25/2014 1234   NITRITE NEGATIVE 07/20/2017 1600   LEUKOCYTESUR NEGATIVE 07/20/2017 1600   Sepsis Labs: @LABRCNTIP (procalcitonin:4,lacticidven:4) )No results found for this or any previous visit (from the  past 240 hour(s)).   Radiological Exams on Admission: Dg Chest Portable 1 View  Result Date: 01/16/2019 CLINICAL DATA:  Chest pain EXAM: PORTABLE CHEST 1 VIEW COMPARISON:  January 12, 2019 FINDINGS: The heart size and mediastinal contours are within normal limits. Aortic knob calcifications are seen. Both lungs are clear. No acute osseous abnormality. Overlying recorder is seen. IMPRESSION: No acute cardiopulmonary process. Electronically Signed   By: Prudencio Pair M.D.   On: 01/16/2019 01:07    EKG: Independently reviewed. Sinus rhythm, no acute ischemic changes appreciated.   Assessment/Plan   1. Chest pain  - Presents with  constant chest pressure that began while at rest ~3:40 pm and has worsened with no alleviating or exacerbating factors identified  - No acute ischemic features appreciated on EKG, initial HS troponin is 6, CXR unremarkable  - Patient took ASA 325 mg pta  - There was no relief with SL NTG in ED  - Cardiology consulted by ED physician and patient was started on heparin and nitroglycerin infusions  - She reports intolerance of statins  - Continue cardiac monitoring, repeat troponin, trend EKG, continue IV heparin and NTG infusions   2. Hypertension  - SBP was 175 in ED, improved to 150 on NTG gtt  - Pharmacy medication-reconciliation is pending, will start metoprolol for now given concern for possible ACS     PPE: mask, face shield  DVT prophylaxis: IV heparin  Code Status: Full  Family Communication: Daughter updated at bedside   Consults called: Cardiology consulted by ED physician  Admission status: Observation     Vianne Bulls, MD [facility] Pager [phone]  If 7PM-7AM, please contact night-coverage [email or web address] Password [email or web address]  01/16/2019, 1:53 AM

## 2019-01-16 NOTE — Progress Notes (Signed)
Patient complained of CP 4/10.  O2 increased from 2 L to 4 L.  EKG obtained.  No changes.  Dr. Johnsie Cancel notified.

## 2019-01-16 NOTE — Progress Notes (Signed)
Verbal order received from Dr Johnsie Cancel  to remove zio patch for coronary CT then to re apply after.  He also gave verbal order to give 1 nitroglycerin sublingual for coronary CT as patient has IV nitro infusing.  BP prior to nitro admin. = 149/57, HR=54   Nitro given at 1039  BP post Coronary CT=133/62 HR= 61

## 2019-01-16 NOTE — Progress Notes (Signed)
Patient ID: Karen Castillo, female   DOB: [birth year], 78 y.o.   MRN: [id number] Patient admitted early this morning for chest pain.  I have reviewed patient medical records including this morning's H&P myself.  Patient seen and examined at bedside and plan of care discussed with her.  Cardiology has been consulted.  Follow recommendations.

## 2019-01-16 NOTE — Progress Notes (Addendum)
See Dr. Kyla Balzarine note from this afternoon. F/u EKGs despite CP have been normal. He reviewed CT FFR results and they were normal, arguing against ischemia as cause of pain. He also indicated that CT did not show any evidence of dissection. 2D echo is unrevealing for cardiac cause of pain. Dr. Johnsie Cancel feels chest pain is noncardiac and can stop IV heparin/NTG. Will update pharmacy order on heparin to reflect DVT ppx only. We did give her a GI cocktail and dose of IV Pepcid so awaiting clinical response.  Would recommend further management of noncardiac chest pain to IM. Discussed with pt, nurse and IM attending. Patient grateful to hear results. Dayna Dunn PA-C

## 2019-01-17 DIAGNOSIS — Z888 Allergy status to other drugs, medicaments and biological substances status: Secondary | ICD-10-CM | POA: Diagnosis not present

## 2019-01-17 DIAGNOSIS — I10 Essential (primary) hypertension: Secondary | ICD-10-CM | POA: Diagnosis not present

## 2019-01-17 DIAGNOSIS — Z79899 Other long term (current) drug therapy: Secondary | ICD-10-CM | POA: Diagnosis not present

## 2019-01-17 DIAGNOSIS — M545 Low back pain: Secondary | ICD-10-CM | POA: Diagnosis not present

## 2019-01-17 DIAGNOSIS — R079 Chest pain, unspecified: Secondary | ICD-10-CM | POA: Diagnosis not present

## 2019-01-17 DIAGNOSIS — Z7982 Long term (current) use of aspirin: Secondary | ICD-10-CM | POA: Diagnosis not present

## 2019-01-17 DIAGNOSIS — Z79891 Long term (current) use of opiate analgesic: Secondary | ICD-10-CM | POA: Diagnosis not present

## 2019-01-17 DIAGNOSIS — E782 Mixed hyperlipidemia: Secondary | ICD-10-CM | POA: Diagnosis not present

## 2019-01-17 DIAGNOSIS — K5904 Chronic idiopathic constipation: Secondary | ICD-10-CM | POA: Diagnosis not present

## 2019-01-17 DIAGNOSIS — R0789 Other chest pain: Secondary | ICD-10-CM | POA: Diagnosis not present

## 2019-01-17 DIAGNOSIS — G894 Chronic pain syndrome: Secondary | ICD-10-CM | POA: Diagnosis not present

## 2019-01-17 DIAGNOSIS — Z20828 Contact with and (suspected) exposure to other viral communicable diseases: Secondary | ICD-10-CM | POA: Diagnosis not present

## 2019-01-17 DIAGNOSIS — Z853 Personal history of malignant neoplasm of breast: Secondary | ICD-10-CM | POA: Diagnosis not present

## 2019-01-17 LAB — COMPREHENSIVE METABOLIC PANEL
ALT: 44 U/L (ref 0–44)
AST: 32 U/L (ref 15–41)
Albumin: 3.1 g/dL — ABNORMAL LOW (ref 3.5–5.0)
Alkaline Phosphatase: 82 U/L (ref 38–126)
Anion gap: 10 (ref 5–15)
BUN: 9 mg/dL (ref 8–23)
CO2: 24 mmol/L (ref 22–32)
Calcium: 8.7 mg/dL — ABNORMAL LOW (ref 8.9–10.3)
Chloride: 103 mmol/L (ref 98–111)
Creatinine, Ser: 0.74 mg/dL (ref 0.44–1.00)
GFR calc Af Amer: >60 mL/min (ref >60)
GFR calc non Af Amer: >60 mL/min (ref >60)
Glucose, Bld: 97 mg/dL (ref 70–99)
Potassium: 3.9 mmol/L (ref 3.5–5.1)
Sodium: 137 mmol/L (ref 135–145)
Total Bilirubin: 0.6 mg/dL (ref 0.3–1.2)
Total Protein: 6.5 g/dL (ref 6.5–8.1)

## 2019-01-17 LAB — CBC
HCT: 36.2 % (ref 36.0–46.0)
Hemoglobin: 11.9 g/dL — ABNORMAL LOW (ref 12.0–15.0)
MCH: 29.8 pg (ref 26.0–34.0)
MCHC: 32.9 g/dL (ref 30.0–36.0)
MCV: 90.7 fL (ref 80.0–100.0)
Platelets: 245 10*3/uL (ref 150–400)
RBC: 3.99 MIL/uL (ref 3.87–5.11)
RDW: 12.5 % (ref 11.5–15.5)
WBC: 8.1 10*3/uL (ref 4.0–10.5)
nRBC: 0 % (ref 0.0–0.2)

## 2019-01-17 LAB — MAGNESIUM: Magnesium: 2.2 mg/dL (ref 1.7–2.4)

## 2019-01-17 MED ORDER — LANSOPRAZOLE 30 MG PO CPDR: 30.0000 mg | DELAYED_RELEASE_CAPSULE | Freq: Every day | ORAL | 0 refills | Status: AC

## 2019-01-17 MED ORDER — NITROGLYCERIN 0.4 MG SL SUBL: 0.4000 mg | SUBLINGUAL_TABLET | SUBLINGUAL | 0 refills | Status: AC | PRN

## 2019-01-17 MED ORDER — ONDANSETRON HCL 4 MG PO TABS: 4.0000 mg | ORAL_TABLET | Freq: Every day | ORAL | 0 refills | Status: DC | PRN

## 2019-01-17 MED ORDER — ALUM & MAG HYDROXIDE-SIMETH 200-200-20 MG/5ML PO SUSP: 30.0000 mL | Freq: Four times a day (QID) | ORAL | 0 refills | Status: DC | PRN

## 2019-01-17 MED ORDER — SUCRALFATE 1 GM/10ML PO SUSP: 1.0000 g | Freq: Three times a day (TID) | ORAL | 2 refills | Status: DC

## 2019-01-17 MED ORDER — POLYETHYLENE GLYCOL 3350 17 G PO PACK: 17.0000 g | PACK | Freq: Every day | ORAL | Status: AC | PRN

## 2019-01-17 NOTE — Discharge Instructions (Signed)
Abdominal Pain, Adult    Many things can cause belly (abdominal) pain. Most times, belly pain is not dangerous. Many cases of belly pain can be watched and treated at home. Sometimes belly pain is serious, though. Your doctor will try to find the cause of your belly pain.  Follow these instructions at home:  · Take over-the-counter and prescription medicines only as told by your doctor. Do not take medicines that help you poop (laxatives) unless told to by your doctor.  · Drink enough fluid to keep your pee (urine) clear or pale yellow.  · Watch your belly pain for any changes.  · Keep all follow-up visits as told by your doctor. This is important.  Contact a doctor if:  · Your belly pain changes or gets worse.  · You are not hungry, or you lose weight without trying.  · You are having trouble pooping (constipated) or have watery poop (diarrhea) for more than 2-3 days.  · You have pain when you pee or poop.  · Your belly pain wakes you up at night.  · Your pain gets worse with meals, after eating, or with certain foods.  · You are throwing up and cannot keep anything down.  · You have a fever.  Get help right away if:  · Your pain does not go away as soon as your doctor says it should.  · You cannot stop throwing up.  · Your pain is only in areas of your belly, such as the right side or the left lower part of the belly.  · You have bloody or black poop, or poop that looks like tar.  · You have very bad pain, cramping, or bloating in your belly.  · You have signs of not having enough fluid or water in your body (dehydration), such as:  ? Dark pee, very little pee, or no pee.  ? Cracked lips.  ? Dry mouth.  ? Sunken eyes.  ? Sleepiness.  ? Weakness.  This information is not intended to replace advice given to you by your health care provider. Make sure you discuss any questions you have with your health care provider.  Document Released: 08/13/2007 Document Revised: 09/14/2015 Document Reviewed: 08/08/2015  [employer]vier  Interactive Patient Education © 2020 [employer]

## 2019-01-17 NOTE — Discharge Summary (Signed)
Physician Discharge Summary  Karen Castillo [id number] DOB: [birth year] DOA: 01/15/2019  PCP: Nicoletta Dress, MD  Admit date: 01/15/2019 Discharge date: 01/17/2019  Admitted From: Home Disposition: Home  Recommendations for Outpatient Follow-up:  1. Follow up with PCP in 1 week with repeat CBC/BMP 2. Outpatient follow-up with cardiology and gastroenterology 3. Follow up in ED if symptoms worsen or new appear   Home Health: No Equipment/Devices: None  Discharge Condition: Stable CODE STATUS: Full Diet recommendation: Heart healthy  Brief/Interim Summary: 78 year old female with history of chronic back pain status post L5-S1 fusion, hypertension, OSA on CPAP presented with chest pain.  Cardiology was consulted.  Troponins were negative.  She was started on heparin and nitroglycerin infusions.  Patient underwent coronary CTA which was normal; subsequently IV heparin and nitroglycerin drips were discontinued.  Protonix and Carafate were added.  Her chest pain has improved.  She feels much better and is okay going home today.  She will need outpatient GI evaluation and follow-up.  She will be discharged on lansoprazole and Carafate along with Maalox as needed.  Discharge Diagnoses:   Chest pain -No ischemic features on EKG.  Troponins have been negative so far.  Chest x-ray unremarkable. -Patient was started on heparin and nitroglycerin drips.  Cardiology was consulted. - Echo showed EF of 65-70%. Patient underwent coronary CTA which was normal; subsequently IV heparin and nitroglycerin drips were discontinued.  Cardiology thought that her chest pain was noncardiac in nature. -Protonix and Carafate were added.  Her chest pain has improved.  She feels much better and is okay going home today.  She will need outpatient GI evaluation and follow-up.  She will be discharged on lansoprazole and Carafate along with Maalox as needed. 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  Outpatient follow-up -Continue home regimen for constipation. Outpatient followup with GI.   Discharge Instructions  Discharge Instructions    Ambulatory referral to Cardiology   Complete by: As directed    Follow up for chest pain   Ambulatory referral to Gastroenterology   Complete by: As directed    Abdominal pain   Diet - low sodium heart healthy   Complete by: As directed    Increase activity slowly   Complete by: As directed      Allergies as of 01/17/2019      Reactions   Ciprofloxacin Swelling   Tongue swells   Cyclobenzaprine Swelling   Feet swell   Pregabalin Anaphylaxis, Shortness Of Breath, Swelling, Other (See Comments)   Dizziness also   Fluvastatin Other (See Comments)   Leg pain   Meloxicam Swelling   Feet became swollen   Metaxalone Rash, Other (See Comments)   Flushing of the skin and sore throat, too   Pramipexole Nausea And Vomiting   Pravastatin Other (See Comments)   Leg pain   Rosuvastatin Other (See Comments)   Leg pain   Sertraline Other (See Comments)   Sensitive teeth   Statins Other (See Comments)   Leg pain    Other    Darvocet   Pravachol [pravastatin Sodium]    Upper leg pain   Amitriptyline Other (See Comments)   Weird feeling all over   Arthrotec [diclofenac-misoprostol] Diarrhea   Augmentin [amoxicillin-pot Clavulanate] Diarrhea   Has patient had a PCN reaction causing immediate rash, facial/tongue/throat swelling, SOB or lightheadedness with hypotension: No Has patient had a PCN  reaction causing severe rash involving mucus membranes or skin necrosis: No Has patient had a PCN reaction that required hospitalization: No Has patient had a PCN reaction occurring within the last 10 years: No If all of the above answers are "NO", then may proceed with Cephalosporin use.   Bromfenac Diarrhea, Nausea  Only   Cephalexin Diarrhea, Nausea Only   Conj Estrog-medroxyprogest Ace Other (See Comments)   Unsure of reaction type Couldn't tolerate   Diclofenac-misoprostol Nausea Only, Diarrhea   Erythromycin Diarrhea, Nausea Only   Esomeprazole Diarrhea   Esomeprazole Magnesium Nausea Only   Levofloxacin Other (See Comments), Nausea Only   Stomach upset   Methadone Diarrhea   Metoclopramide Diarrhea, Nausea Only   Oxycodone Hives, Swelling, Rash   Propoxyphene Diarrhea, Nausea Only   Rofecoxib Diarrhea, Nausea Only   Sulfur Diarrhea, Nausea Only   Tramadol Diarrhea   Zoloft [sertraline Hcl] Other (See Comments)   Sensitivity with teeth      Medication List    TAKE these medications   alendronate 70 MG tablet Commonly known as: FOSAMAX Take 70 mg by mouth every Thursday. Take with a full glass of water on an empty stomach.   allopurinol 100 MG tablet Commonly known as: ZYLOPRIM Take 100 mg by mouth daily.   alum & mag hydroxide-simeth 200-200-20 MG/5ML suspension Commonly known as: MAALOX/MYLANTA Take 30 mLs by mouth every 6 (six) hours as needed for indigestion or heartburn.   aspirin EC 81 MG tablet Take 81 mg by mouth daily.   Benefiber Powd Take 1 packet by mouth daily. Clear and Sugar FREE   calcium carbonate 1500 (600 Ca) MG Tabs tablet Commonly known as: OSCAL Take 600 mg of elemental calcium by mouth 3 (three) times daily with meals.   cloNIDine 0.1 MG tablet Commonly known as: CATAPRES Take 0.1 mg by mouth at bedtime.   cycloSPORINE 0.05 % ophthalmic emulsion Commonly known as: RESTASIS Place 1 drop into both eyes 4 (four) times daily.   Fish Oil 600 MG Caps Take 600 mg by mouth 2 (two) times daily.   hydrochlorothiazide 25 MG tablet Commonly known as: HYDRODIURIL Take 25 mg by mouth daily.   HYDROmorphone 2 MG tablet Commonly known as: DILAUDID Take 2 mg by mouth every 4 (four) hours.   lansoprazole 30 MG capsule Commonly known as: PREVACID Take 1  capsule (30 mg total) by mouth daily before breakfast. 30 minutes before breakfast   latanoprost 0.005 % ophthalmic solution Commonly known as: XALATAN Place 1 drop into both eyes at bedtime.   linaclotide 290 MCG Caps capsule Commonly known as: Linzess Take 1 capsule (290 mcg total) by mouth daily before breakfast. What changed: Another medication with the same name was removed. Continue taking this medication, and follow the directions you see here.   Livalo 2 MG Tabs Generic drug: Pitavastatin Calcium Take 1 tablet by mouth at bedtime.   methocarbamol 750 MG tablet Commonly known as: ROBAXIN Take 750 mg by mouth 3 (three) times daily.   nadolol 40 MG tablet Commonly known as: CORGARD Take 40 mg by mouth at bedtime.   nitroGLYCERIN 0.4 MG SL tablet Commonly known as: NITROSTAT Place 1 tablet (0.4 mg total) under the tongue every 5 (five) minutes as needed for chest pain.   ondansetron 4 MG tablet Commonly known as: Zofran Take 1 tablet (4 mg total) by mouth daily as needed for nausea or vomiting.   oxybutynin 5 MG tablet Commonly known as: DITROPAN Take 5 mg by  mouth 2 (two) times daily.   polyethylene glycol 17 g packet Commonly known as: MIRALAX / GLYCOLAX Take 17 g by mouth daily as needed for mild constipation.   PROBIOTIC ACIDOPHILUS PO Take 1 tablet by mouth daily.   spironolactone 25 MG tablet Commonly known as: ALDACTONE Take 25 mg by mouth daily.   sucralfate 1 GM/10ML suspension Commonly known as: CARAFATE Take 10 mLs (1 g total) by mouth 4 (four) times daily -  with meals and at bedtime.   timolol 0.5 % ophthalmic solution Commonly known as: BETIMOL Place 1 drop into both eyes daily.   traZODone 100 MG tablet Commonly known as: DESYREL Take 200 mg by mouth at bedtime.   trimethoprim 100 MG tablet Commonly known as: TRIMPEX Take 100 mg by mouth daily.   valsartan 80 MG tablet Commonly known as: DIOVAN Take 80 mg by mouth daily.   Vitamin  D3 50 MCG (2000 UT) Tabs Take 1 tablet by mouth daily.      Follow-up Information    Nicoletta Dress, MD. Schedule an appointment as soon as possible for a visit in 1 week(s).   Specialty: Internal Medicine Contact information: [address] [place] [address] [phone]        Jackquline Denmark, MD. Schedule an appointment as soon as possible for a visit in 1 week(s).   Specialties: Gastroenterology, Internal Medicine Contact information: [place] [phone]          Allergies  Allergen Reactions  . Ciprofloxacin Swelling    Tongue swells  . Cyclobenzaprine Swelling    Feet swell   . Pregabalin Anaphylaxis, Shortness Of Breath, Swelling and Other (See Comments)    Dizziness also  . Fluvastatin Other (See Comments)    Leg pain   . Meloxicam Swelling    Feet became swollen  . Metaxalone Rash and Other (See Comments)    Flushing of the skin and sore throat, too  . Pramipexole Nausea And Vomiting  . Pravastatin Other (See Comments)    Leg pain  . Rosuvastatin Other (See Comments)    Leg pain  . Sertraline Other (See Comments)    Sensitive teeth  . Statins Other (See Comments)    Leg pain     . Other     Darvocet  . Pravachol [Pravastatin Sodium]     Upper leg pain  . Amitriptyline Other (See Comments)    Weird feeling all over  . Arthrotec [Diclofenac-Misoprostol] Diarrhea  . Augmentin [Amoxicillin-Pot Clavulanate] Diarrhea    Has patient had a PCN reaction causing immediate rash, facial/tongue/throat swelling, SOB or lightheadedness with hypotension: No Has patient had a PCN reaction causing severe rash involving mucus membranes or skin necrosis: No Has patient had a PCN reaction that required hospitalization: No Has patient had a PCN reaction occurring within the last 10 years: No If all of the above answers are "NO", then may proceed with Cephalosporin use.   . Bromfenac  Diarrhea and Nausea Only  . Cephalexin Diarrhea and Nausea Only  . Conj Estrog-Medroxyprogest Ace Other (See Comments)    Unsure of reaction type Couldn't tolerate  . Diclofenac-Misoprostol Nausea Only and Diarrhea  . Erythromycin Diarrhea and Nausea Only  . Esomeprazole Diarrhea  . Esomeprazole Magnesium Nausea Only  . Levofloxacin Other (See Comments) and Nausea Only    Stomach upset  . Methadone Diarrhea  . Metoclopramide Diarrhea and Nausea Only  . Oxycodone Hives,  Swelling and Rash  . Propoxyphene Diarrhea and Nausea Only  . Rofecoxib Diarrhea and Nausea Only  . Sulfur Diarrhea and Nausea Only  . Tramadol Diarrhea  . Zoloft [Sertraline Hcl] Other (See Comments)    Sensitivity with teeth    Consultations:  Cardiology   Procedures/Studies: Dg Chest 2 View  Result Date: 01/12/2019 CLINICAL DATA:  Chest pain EXAM: CHEST - 2 VIEW COMPARISON:  11/29/2018 FINDINGS: Heart and mediastinal contours are within normal limits. No focal opacities or effusions. No acute bony abnormality. Postoperative changes from posterior fusion in the lower thoracic and visualized lumbar spine. IMPRESSION: No active cardiopulmonary disease. Electronically Signed   By: Rolm Baptise M.D.   On: 01/12/2019 01:54   Ct Coronary Morph W/cta Cor W/score W/ca W/cm &/or Wo/cm  Addendum Date: 01/17/2019   ADDENDUM REPORT: 01/17/2019 10:23 EXAM: OVER-READ INTERPRETATION  CT CHEST The following report is an over-read performed by radiologist Dr. Eben Burow [facility] on 01/17/2019. This over-read does not include interpretation of cardiac or coronary anatomy or pathology. The coronary calcium score/coronary CTA interpretation by the cardiologist is attached. COMPARISON:  None. FINDINGS: The visualized portions of the lower lung fields show no suspicious nodules, masses, or infiltrates. No pleural fluid seen. The visualized portions of the mediastinum and chest wall are unremarkable. IMPRESSION: No  significant non-cardiovascular abnormality seen in visualized portion of the thorax. Electronically Signed   By: Marlaine Hind M.D.   On: 01/17/2019 10:23   Result Date: 01/17/2019 CLINICAL DATA:  Chest pain EXAM: Cardiac CTA MEDICATIONS: Sub lingual nitro. 4mg  and lopressor 50mg  Patient also on heparin and iv nitro from floor TECHNIQUE: The patient was scanned on a Siemens Force [id number] slice scanner. Gantry rotation speed was 250 msecs. Collimation was .6 mm. A 100 kV prospective scan was triggered in the ascending thoracic aorta at 140 HU's Full mA was used between 35% and 75% of the R-R interval. Average HR during the scan was 56 bpm. The 3D data set was interpreted on a dedicated work station using MPR, MIP and VRT modes. A total of 80cc of contrast was used. FINDINGS: Non-cardiac: See separate report from [facility]. No significant findings on limited lung and soft tissue windows. Calcium Score: LM and 3 vessel calcium noted Coronary Arteries: Right dominant with no anomalies LM: 1-24% calcified ostial plaque LAD: 50-69% long calcified plaque proximally 25-49% mixed plaque in mid vessel and 1-24% calcified plaque distally D1: Normal D2: Normal Circumflex: 1-24% mixed plaque in mid vessel involving OM2 OM1: Normal high take off RCA: 1-24% calcified plaque proximally 25-49% calcified plaque mid vessel 1-24% calcified plaque distally PDA: Normal PLA: 1-24% soft plaque IMPRESSION: 1. Normal aortic root 3.2 cm with moderate calcific atherosclerosis 2. Calcium score 1213 involving LM and 3 vessels this is 95 th percentile for age and sex 53. CAD see description above study sent for FFR CT to r/o obstructive disease in mid RCA and proximal LAD Jenkins Rouge Electronically Signed: By: Jenkins Rouge M.D. On: 01/16/2019 11:33   Dg Chest Portable 1 View  Result Date: 01/16/2019 CLINICAL DATA:  Chest pain EXAM: PORTABLE CHEST 1 VIEW COMPARISON:  January 12, 2019 FINDINGS: The heart size and mediastinal contours  are within normal limits. Aortic knob calcifications are seen. Both lungs are clear. No acute osseous abnormality. Overlying recorder is seen. IMPRESSION: No acute cardiopulmonary process. Electronically Signed   By: Prudencio Pair M.D.   On: 01/16/2019 01:07   Ct Coronary Fractional Flow [employer]  Prep  Result Date: 01/16/2019 CLINICAL DATA:  CAD EXAM: FFR CT TECHNIQUE: The best systolic and diastolic phases of the patients gated cardiac CTA sent to [facility] for hemodynamic analysis FINDINGS: All values in normal range Specifically RCA 0.98 LAD 0.90 and Circumflex 0.97 IMPRESSION: Negative FFR CT Jenkins Rouge Electronically Signed   By: Jenkins Rouge M.D.   On: 01/16/2019 15:50   Ct Angio Chest/abd/pel For Dissection W And/or Wo Contrast  Result Date: 01/12/2019 CLINICAL DATA:  Complex chest pain radiating to the epigastric region and back. EXAM: CT ANGIOGRAPHY CHEST, ABDOMEN AND PELVIS TECHNIQUE: Multidetector CT imaging through the chest, abdomen and pelvis was performed using the standard protocol during bolus administration of intravenous contrast. Multiplanar reconstructed images and MIPs were obtained and reviewed to evaluate the vascular anatomy. CONTRAST:  74mL OMNIPAQUE IOHEXOL 350 MG/ML SOLN COMPARISON:  CT dated August 31, 2017. FINDINGS: CTA CHEST FINDINGS Cardiovascular: There is no evidence for an aortic dissection. The heart size is enlarged. Aortic and coronary artery calcifications are noted. There is no pulmonary embolism. Mediastinum/Nodes: --No mediastinal or hilar lymphadenopathy. --No axillary lymphadenopathy. --No supraclavicular lymphadenopathy. --Normal thyroid gland. --The esophagus is unremarkable Lungs/Pleura: No pulmonary nodules or masses. No pleural effusion or pneumothorax. No focal airspace consolidation. No focal pleural abnormality. Musculoskeletal: No chest wall abnormality. No acute or significant osseous findings. Review of the MIP images confirms the above findings.  CTA ABDOMEN AND PELVIS FINDINGS VASCULAR Aorta: Atherosclerotic changes are noted without evidence for an aneurysm. Celiac: Patent without evidence of aneurysm, dissection, vasculitis or significant stenosis. SMA: Patent without evidence of aneurysm, dissection, vasculitis or significant stenosis. Renals: Both renal arteries are patent without evidence of aneurysm, dissection, vasculitis, fibromuscular dysplasia or significant stenosis. IMA: Patent without evidence of aneurysm, dissection, vasculitis or significant stenosis. Inflow: Patent without evidence of aneurysm, dissection, vasculitis or significant stenosis. Veins: No obvious venous abnormality within the limitations of this arterial phase study. Review of the MIP images confirms the above findings. NON-VASCULAR Hepatobiliary: The liver is normal. Status post cholecystectomy.There is no biliary ductal dilation. Pancreas: Normal contours without ductal dilatation. No peripancreatic fluid collection. Spleen: No splenic laceration or hematoma. Adrenals/Urinary Tract: --Adrenal glands: No adrenal hemorrhage. --Right kidney/ureter: No hydronephrosis or perinephric hematoma. --Left kidney/ureter: No hydronephrosis or perinephric hematoma. --Urinary bladder: Unremarkable. Stomach/Bowel: --Stomach/Duodenum: No hiatal hernia or other gastric abnormality. Normal duodenal course and caliber. --Small bowel: No dilatation or inflammation. --Colon: There is an above average amount of stool throughout the colon. --Appendix: Normal. Lymphatic: --No retroperitoneal lymphadenopathy. --No mesenteric lymphadenopathy. --No pelvic or inguinal lymphadenopathy. Reproductive: Unremarkable Other: No ascites or free air. The abdominal wall is normal. Musculoskeletal. Extensive posterior fusion hardware is noted extending from T10 through the sacrum. The hardware appears to be grossly intact. There is new height loss of the L1 vertebral body. Review of the MIP images confirms the above  findings. IMPRESSION: 1. No evidence for aortic dissection or other acute thoracic, abdominal or pelvic pathology. 2. Age-indeterminate height loss of the L1 vertebral body, new since August 31, 2017. 3. Above average stool burden. 4. Aortic atherosclerosis. Aortic Atherosclerosis (ICD10-I70.0). Electronically Signed   By: Constance Holster M.D.   On: 01/12/2019 04:22    Echo  1. Left ventricular ejection fraction, by visual estimation, is 65 to 70%. The left ventricle has normal function. There is mildly increased left ventricular hypertrophy.  2. Global right ventricle has normal systolic function.The right ventricular size is normal. No increase in right ventricular wall thickness.  3. Left  atrial size was upper normal.  4. Right atrial size was normal.  5. Mild aortic valve annular calcification.  6. Moderate mitral annular calcification.  7. The mitral valve is degenerative. Mild mitral valve regurgitation.  8. The tricuspid valve is grossly normal. Tricuspid valve regurgitation is trivial.  9. The aortic valve is tricuspid. Aortic valve regurgitation is not visualized. 10. The pulmonic valve was not well visualized. Pulmonic valve regurgitation is trivial. 11. TR signal is inadequate for assessing pulmonary artery systolic pressure.  Subjective: Patient seen and examined at bedside.  She states that her chest pain is much improved.  She is okay to go home today.  No overnight fever or vomiting. Discharge Exam: Vitals:   01/17/19 0005 01/17/19 0438  BP: (!) 141/57 (!) 146/48  Pulse: (!) 53 (!) 59  Resp:    Temp:  98.6 F (37 C)  SpO2: 99% 99%    General: Pt is alert, awake, not in acute distress Cardiovascular: Mild bradycardia, S1/S2 + Respiratory: bilateral decreased breath sounds at bases Abdominal: Soft, NT, ND, bowel sounds + Extremities: no edema, no cyanosis    The results of significant diagnostics from this hospitalization (including imaging, microbiology, ancillary  and laboratory) are listed below for reference.     Microbiology: Recent Results (from the past 240 hour(s))  SARS CORONAVIRUS 2 (TAT 6-24 HRS) Nasopharyngeal Nasopharyngeal Swab     Status: None   Collection Time: 01/15/19 11:53 PM   Specimen: Nasopharyngeal Swab  Result Value Ref Range Status   SARS Coronavirus 2 NEGATIVE NEGATIVE Final    Comment: (NOTE) SARS-CoV-2 target nucleic acids are NOT DETECTED. The SARS-CoV-2 RNA is generally detectable in upper and lower respiratory specimens during the acute phase of infection. Negative results do not preclude SARS-CoV-2 infection, do not rule out co-infections with other pathogens, and should not be used as the sole basis for treatment or other patient management decisions. Negative results must be combined with clinical observations, patient history, and epidemiological information. The expected result is Negative. Fact Sheet for Patients: SugarRoll.be Fact Sheet for Healthcare Providers: https://www.woods-mathews.com/ This test is not yet approved or cleared by the [place] FDA and  has been authorized for detection and/or diagnosis of SARS-CoV-2 by FDA under an Emergency Use Authorization (EUA). This EUA will remain  in effect (meaning this test can be used) for the duration of the COVID-19 declaration under Section 56 4(b)(1) of the Act, 21 U.S.C. section 360bbb-3(b)(1), unless the authorization is terminated or revoked sooner. Performed at [facility], [place] [address], [place], [place] [address]      Labs: BNP (last 3 results) Recent Labs    01/15/19 2315  BNP [id number]   Basic Metabolic Panel: Recent Labs  Lab 01/12/19 0138 01/15/19 2315 01/16/19 0331 01/17/19 0351  NA 134* 134* 138 137  K 3.7 3.7 3.7 3.9  CL 97* 101 105 103  CO2 25 23 22 24   GLUCOSE 133* 122* 119* 97  BUN 15 12 12 9   CREATININE 1.00 0.85 0.95 0.74  CALCIUM 9.6 9.2 9.0 8.7*  MG  --   --   --   2.2   Liver Function Tests: Recent Labs  Lab 01/17/19 0351  AST 32  ALT 44  ALKPHOS 82  BILITOT 0.6  PROT 6.5  ALBUMIN 3.1*   Recent Labs  Lab 01/12/19 0500  LIPASE 39   No results for input(s): AMMONIA in the last 168 hours. CBC: Recent Labs  Lab 01/12/19 0138 01/15/19 2315  01/16/19 1021 01/17/19 0351  WBC 7.9 8.1 8.2 8.1  NEUTROABS  --  4.8  --   --   HGB 12.4 12.5 11.6* 11.9*  HCT 37.5 38.0 35.9* 36.2  MCV 90.6 90.7 91.8 90.7  PLT 280 282 265 245   Cardiac Enzymes: No results for input(s): CKTOTAL, CKMB, CKMBINDEX, TROPONINI in the last 168 hours. BNP: Invalid input(s): POCBNP CBG: No results for input(s): GLUCAP in the last 168 hours. D-Dimer No results for input(s): DDIMER in the last 72 hours. Hgb A1c No results for input(s): HGBA1C in the last 72 hours. Lipid Profile No results for input(s): CHOL, HDL, LDLCALC, TRIG, CHOLHDL, LDLDIRECT in the last 72 hours. Thyroid function studies No results for input(s): TSH, T4TOTAL, T3FREE, THYROIDAB in the last 72 hours.  Invalid input(s): FREET3 Anemia work up No results for input(s): VITAMINB12, FOLATE, FERRITIN, TIBC, IRON, RETICCTPCT in the last 72 hours. Urinalysis    Component Value Date/Time   COLORURINE YELLOW 07/20/2017 1600   APPEARANCEUR CLEAR 07/20/2017 1600   LABSPEC 1.014 07/20/2017 1600   PHURINE 5.0 07/20/2017 1600   GLUCOSEU 150 (A) 07/20/2017 1600   HGBUR NEGATIVE 07/20/2017 1600   BILIRUBINUR NEGATIVE 07/20/2017 1600   KETONESUR NEGATIVE 07/20/2017 1600   PROTEINUR NEGATIVE 07/20/2017 1600   UROBILINOGEN 0.2 04/25/2014 1234   NITRITE NEGATIVE 07/20/2017 1600   LEUKOCYTESUR NEGATIVE 07/20/2017 1600   Sepsis Labs Invalid input(s): PROCALCITONIN,  WBC,  LACTICIDVEN Microbiology Recent Results (from the past 240 hour(s))  SARS CORONAVIRUS 2 (TAT 6-24 HRS) Nasopharyngeal Nasopharyngeal Swab     Status: None   Collection Time: 01/15/19 11:53 PM   Specimen: Nasopharyngeal Swab  Result  Value Ref Range Status   SARS Coronavirus 2 NEGATIVE NEGATIVE Final    Comment: (NOTE) SARS-CoV-2 target nucleic acids are NOT DETECTED. The SARS-CoV-2 RNA is generally detectable in upper and lower respiratory specimens during the acute phase of infection. Negative results do not preclude SARS-CoV-2 infection, do not rule out co-infections with other pathogens, and should not be used as the sole basis for treatment or other patient management decisions. Negative results must be combined with clinical observations, patient history, and epidemiological information. The expected result is Negative. Fact Sheet for Patients: SugarRoll.be Fact Sheet for Healthcare Providers: https://www.woods-mathews.com/ This test is not yet approved or cleared by the [place] FDA and  has been authorized for detection and/or diagnosis of SARS-CoV-2 by FDA under an Emergency Use Authorization (EUA). This EUA will remain  in effect (meaning this test can be used) for the duration of the COVID-19 declaration under Section 56 4(b)(1) of the Act, 21 U.S.C. section 360bbb-3(b)(1), unless the authorization is terminated or revoked sooner. Performed at [facility], [place] [address], [place], [place] [address]      Time coordinating discharge: 35 minutes  SIGNED:   Aline August, MD  [facility] 01/17/2019, 10:55 AM

## 2019-01-18 ENCOUNTER — Telehealth: Payer: Self-pay | Admitting: Cardiology

## 2019-01-18 ENCOUNTER — Encounter: Payer: Self-pay | Admitting: Gastroenterology

## 2019-01-18 NOTE — Telephone Encounter (Signed)
error 

## 2019-01-20 DIAGNOSIS — N390 Urinary tract infection, site not specified: Secondary | ICD-10-CM | POA: Diagnosis not present

## 2019-01-20 DIAGNOSIS — Z7689 Persons encountering health services in other specified circumstances: Secondary | ICD-10-CM | POA: Diagnosis not present

## 2019-01-20 DIAGNOSIS — K219 Gastro-esophageal reflux disease without esophagitis: Secondary | ICD-10-CM | POA: Diagnosis not present

## 2019-01-20 DIAGNOSIS — I1310 Hypertensive heart and chronic kidney disease without heart failure, with stage 1 through stage 4 chronic kidney disease, or unspecified chronic kidney disease: Secondary | ICD-10-CM | POA: Diagnosis not present

## 2019-01-20 DIAGNOSIS — N182 Chronic kidney disease, stage 2 (mild): Secondary | ICD-10-CM | POA: Diagnosis not present

## 2019-01-20 DIAGNOSIS — Z6835 Body mass index (BMI) 35.0-35.9, adult: Secondary | ICD-10-CM | POA: Diagnosis not present

## 2019-01-20 DIAGNOSIS — R002 Palpitations: Secondary | ICD-10-CM | POA: Diagnosis not present

## 2019-01-20 DIAGNOSIS — R0789 Other chest pain: Secondary | ICD-10-CM | POA: Diagnosis not present

## 2019-01-21 DIAGNOSIS — R002 Palpitations: Secondary | ICD-10-CM | POA: Diagnosis not present

## 2019-01-24 DIAGNOSIS — Z1231 Encounter for screening mammogram for malignant neoplasm of breast: Secondary | ICD-10-CM | POA: Diagnosis not present

## 2019-01-27 DIAGNOSIS — G4700 Insomnia, unspecified: Secondary | ICD-10-CM | POA: Diagnosis not present

## 2019-01-27 DIAGNOSIS — M5136 Other intervertebral disc degeneration, lumbar region: Secondary | ICD-10-CM | POA: Diagnosis not present

## 2019-01-27 DIAGNOSIS — M62830 Muscle spasm of back: Secondary | ICD-10-CM | POA: Diagnosis not present

## 2019-01-27 DIAGNOSIS — Z79899 Other long term (current) drug therapy: Secondary | ICD-10-CM | POA: Diagnosis not present

## 2019-01-27 DIAGNOSIS — M545 Low back pain: Secondary | ICD-10-CM | POA: Diagnosis not present

## 2019-01-28 DIAGNOSIS — M8580 Other specified disorders of bone density and structure, unspecified site: Secondary | ICD-10-CM | POA: Diagnosis not present

## 2019-01-28 DIAGNOSIS — M810 Age-related osteoporosis without current pathological fracture: Secondary | ICD-10-CM | POA: Diagnosis not present

## 2019-01-28 DIAGNOSIS — Z853 Personal history of malignant neoplasm of breast: Secondary | ICD-10-CM | POA: Diagnosis not present

## 2019-01-29 NOTE — ED Provider Notes (Signed)
[facility] EMERGENCY DEPARTMENT Provider Note   CSN: [id number] Arrival date & time: 01/12/19  0113     History   Chief Complaint Chief Complaint  Patient presents with   Chest Pain    HPI Karen Castillo is a 78 y.o. female.      Chest Pain Pain location:  Substernal area Pain quality: aching   Pain radiates to:  Does not radiate Pain severity:  Mild Onset quality:  Gradual Timing:  Constant Progression:  Resolved Chronicity:  New Context: not breathing   Relieved by:  None tried Worsened by:  Nothing   Past Medical History:  Diagnosis Date   Abdominal pain    Acute post-operative pain 07/13/2017   Allergic rhinitis    Anemia    Arthritis    Bipolar disorder ([place])    patient denies   Cancer ([place])    SKIN    ,    LEFT BREAST    Chronic bilateral low back pain 09/16/2016   Chronic constipation    Chronic pain syndrome    Constipation    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   Dry eyes    Dysuria    Elevated cholesterol    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   Gallstones    GERD (gastroesophageal reflux disease)    Glaucoma    both eyes   Gout    Hypertension    IBS (irritable bowel syndrome)    Idiopathic peripheral neuropathy 06/11/2015   Infection of lumbar spine (HCC)    Ingrown nail of great toe of right foot 05/04/2017   Insomnia    Intractable low back pain 07/13/2017   Low back pain 08/27/2017   Nausea without vomiting    Obesity    Pain syndrome, chronic 11/29/2015   Paronychia of great toe, right 05/04/2017   Postoperative anemia due to acute blood loss 08/29/2017   Psychophysiological insomnia 04/03/2015   Radiculopathy 05/04/2014   Radiculopathy of lumbar region 11/29/2015   Risk for falls 06/11/2015   Schizophrenia ([place])    Sleep apnea    USES CPAP    Sleep apnea, obstructive 04/03/2015   Urticaria    Vitamin deficiency     Patient Active Problem List   Diagnosis Date Noted     Chest pain 01/16/2019   Hypertension 01/16/2019   Infection of lumbar spine (HCC)    Abdominal pain    Nausea without vomiting    Constipation    Postoperative anemia due to acute blood loss 08/29/2017   Low back pain 08/27/2017   Urticaria 07/19/2017   Acute post-operative pain 07/13/2017   Intractable low back pain 07/13/2017   Ingrown nail of great toe of right foot 05/04/2017   Paronychia of great toe, right 05/04/2017   Chronic bilateral low back pain 09/16/2016   Radiculopathy of lumbar region 11/29/2015   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   Pain syndrome, chronic 11/29/2015   Idiopathic peripheral neuropathy 06/11/2015   Risk for falls 06/11/2015   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   Psychophysiological insomnia 04/03/2015   Sleep apnea, obstructive 04/03/2015   Radiculopathy 05/04/2014    Past Surgical History:  Procedure Laterality Date   ABDOMINAL EXPOSURE N/A 05/04/2014   Procedure: ABDOMINAL EXPOSURE;  Surgeon: Serafina Mitchell, MD;  Location: [facility];  Service: Vascular;  Laterality: N/A;   ANTERIOR LUMBAR FUSION N/A 05/04/2014   Procedure: ANTERIOR LUMBAR FUSION 1 LEVEL;  Surgeon: Sinclair Ship, MD;  Location: [facility] OR;  Service: Orthopedics;  Laterality: N/A;  Lumbar 5-sacrum 1 anterior lumbar interbody fusion with instrumentation and allograft   ANTERIOR LUMBAR FUSION  07/2017   ANTERIOR LUMBAR FUSION  08/2017   BACK SURGERY     2010 ,2015LUMB FUSION, 06/2013 [facility]USION   BACK SURGERY  07/2017   Lumbar   BREAST BIOPSY  2013   pt said it was positive    BREAST SURGERY     LEFT BRREAST BX   CARPAL TUNNEL RELEASE     1994 RT   CARPAL TUNNEL RELEASE Bilateral    CATARACT EXTRACTION, BILATERAL  2017   CHOLECYSTECTOMY     2004   COLONOSCOPY W/ BIOPSIES     Colonoscopy AO:[id number], 2009, 2011   ERCP     2009   ESOPHAGOGASTRODUODENOSCOPY  08/09/2017   Small hiatal hernia. Mild gastritis. Incidenta;  duodenal diverticulum. Incidental gastric polyp.    LEEP     1992 and 1994   SKIN CANCER EXCISION     X 4    thorocolumbar fusion  06/2013   TUBAL LIGATION       OB History   No obstetric history on file.      Home Medications    Prior to Admission medications   Medication Sig Start Date End Date Taking? Authorizing Provider  alendronate (FOSAMAX) 70 MG tablet Take 70 mg by mouth every Thursday. Take with a full glass of water on an empty stomach.   Yes [provider]  allopurinol (ZYLOPRIM) 100 MG tablet Take 100 mg by mouth daily.   Yes [provider]  aspirin EC 81 MG tablet Take 81 mg by mouth daily.   Yes [provider]  calcium carbonate (OSCAL) 1500 (600 Ca) MG TABS tablet Take 600 mg of elemental calcium by mouth 3 (three) times daily with meals.   Yes [provider]  Cholecalciferol (VITAMIN D3) 2000 units TABS Take 1 tablet by mouth daily.   Yes [provider]  cloNIDine (CATAPRES) 0.1 MG tablet Take 0.1 mg by mouth at bedtime.   Yes [provider]  cycloSPORINE (RESTASIS) 0.05 % ophthalmic emulsion Place 1 drop into both eyes 4 (four) times daily.   Yes [provider]  HYDROmorphone (DILAUDID) 2 MG tablet Take 2 mg by mouth every 4 (four) hours.   Yes [provider]  Lactobacillus (PROBIOTIC ACIDOPHILUS PO) Take 1 tablet by mouth daily.   Yes [provider]  latanoprost (XALATAN) 0.005 % ophthalmic solution Place 1 drop into both eyes at bedtime.   Yes [provider]  LIVALO 2 MG TABS Take 1 tablet by mouth at bedtime. 12/27/18  Yes [provider]  nadolol (CORGARD) 40 MG tablet Take 40 mg by mouth at bedtime.   Yes [provider]  oxybutynin (DITROPAN) 5 MG tablet Take 5 mg by mouth 2 (two) times daily.   Yes [provider]  spironolactone (ALDACTONE) 25 MG tablet Take 25 mg by mouth daily.   Yes [provider]  timolol (BETIMOL)  0.5 % ophthalmic solution Place 1 drop into both eyes daily.   Yes [provider]  traZODone (DESYREL) 100 MG tablet Take 200 mg by mouth at bedtime.    Yes [provider]  trimethoprim (TRIMPEX) 100 MG tablet Take 100 mg by mouth daily.   Yes [provider]  Wheat Dextrin (BENEFIBER) POWD Take 1 packet by mouth daily. Clear and Sugar FREE   Yes [provider]  alum &  mag hydroxide-simeth (MAALOX/MYLANTA) 200-200-20 MG/5ML suspension Take 30 mLs by mouth every 6 (six) hours as needed for indigestion or heartburn. 01/17/19   Aline August, MD  hydrochlorothiazide (HYDRODIURIL) 25 MG tablet Take 25 mg by mouth daily. 01/14/19   [provider]  lansoprazole (PREVACID) 30 MG capsule Take 1 capsule (30 mg total) by mouth daily before breakfast. 30 minutes before breakfast 01/17/19   Aline August, MD  linaclotide [facility]) 290 MCG CAPS capsule Take 1 capsule (290 mcg total) by mouth daily before breakfast. 11/27/17   Jackquline Denmark, MD  methocarbamol (ROBAXIN) 750 MG tablet Take 750 mg by mouth 3 (three) times daily.    [provider]  nitroGLYCERIN (NITROSTAT) 0.4 MG SL tablet Place 1 tablet (0.4 mg total) under the tongue every 5 (five) minutes as needed for chest pain. 01/17/19   Aline August, MD  Omega-3 Fatty Acids (FISH OIL) 600 MG CAPS Take 600 mg by mouth 2 (two) times daily.    [provider]  ondansetron (ZOFRAN) 4 MG tablet Take 1 tablet (4 mg total) by mouth daily as needed for nausea or vomiting. 01/17/19 01/17/20  Aline August, MD  polyethylene glycol (MIRALAX / GLYCOLAX) 17 g packet Take 17 g by mouth daily as needed for mild constipation. 01/17/19   Aline August, MD  sucralfate (CARAFATE) 1 GM/10ML suspension Take 10 mLs (1 g total) by mouth 4 (four) times daily -  with meals and at bedtime. 01/17/19   Aline August, MD  valsartan (DIOVAN) 80 MG tablet Take 80 mg by mouth daily.    [provider]    Family  History Family History  Problem Relation Age of Onset   Diabetes Mother        had a history but not anymore   Prostate cancer Father    Stroke Sister        has a brain tumor   Colon cancer Neg Hx    Esophageal cancer Neg Hx     Social History Social History   Tobacco Use   Smoking status: Never Smoker   Smokeless tobacco: Never Used  Substance Use Topics   Alcohol use: No   Drug use: No     Allergies   Ciprofloxacin, Cyclobenzaprine, Pregabalin, Fluvastatin, Meloxicam, Metaxalone, Pramipexole, Pravastatin, Rosuvastatin, Sertraline, Statins, Other, Pravachol [pravastatin sodium], Amitriptyline, Arthrotec [diclofenac-misoprostol], Augmentin [amoxicillin-pot clavulanate], Bromfenac, Cephalexin, Conj estrog-medroxyprogest ace, Diclofenac-misoprostol, Erythromycin, Esomeprazole, Esomeprazole magnesium, Levofloxacin, Methadone, Metoclopramide, Oxycodone, Propoxyphene, Rofecoxib, Sulfur, Tramadol, and Zoloft [sertraline hcl]   Review of Systems Review of Systems  Cardiovascular: Positive for chest pain.  All other systems reviewed and are negative.    Physical Exam Updated Vital Signs BP (!) 150/57 (BP Location: Right Arm)    Pulse 72    Temp 98.6 F (37 C) (Oral)    Resp 11    Ht 5\' 4"  (1.626 m)    Wt 92.1 kg    SpO2 96%    BMI 34.84 kg/m   Physical Exam Vitals signs and nursing note reviewed.  Constitutional:      Appearance: She is well-developed.  HENT:     Head: Normocephalic and atraumatic.     Nose: Nose normal.  Eyes:     Extraocular Movements: Extraocular movements intact.     Conjunctiva/sclera: Conjunctivae normal.  Neck:     Musculoskeletal: Normal range of motion.  Cardiovascular:     Rate and Rhythm: Normal rate and regular rhythm.  Pulmonary:     Effort: No respiratory distress.  Breath sounds: No stridor. No decreased breath sounds or wheezing.  Abdominal:     General: There is no distension.  Musculoskeletal:        General: No  swelling.  Skin:    General: Skin is warm and dry.  Neurological:     General: No focal deficit present.     Mental Status: She is alert.      ED Treatments / Results  Labs (all labs ordered are listed, but only abnormal results are displayed) Labs Reviewed  BASIC METABOLIC PANEL - Abnormal; Notable for the following components:      Result Value   Sodium 134 (*)    Chloride 97 (*)    Glucose, Bld 133 (*)    GFR calc non Af Amer 55 (*)    All other components within normal limits  CBC  LIPASE, BLOOD  TROPONIN I (HIGH SENSITIVITY)  TROPONIN I (HIGH SENSITIVITY)    EKG EKG Interpretation  Date/Time:  Wednesday January 12 2019 01:23:40 EST Ventricular Rate:  72 PR Interval:  188 QRS Duration: 92 QT Interval:  414 QTC Calculation: 453 R Axis:   49 Text Interpretation: Normal sinus rhythm Cannot rule out Anterior infarct , age undetermined Abnormal ECG pretty similar to june 2019 Confirmed by Merrily Pew [phone]) on 01/12/2019 1:32:25 AM   Radiology No results found.  Procedures Procedures (including critical care time)  Medications Ordered in ED Medications  iohexol (OMNIPAQUE) 350 MG/ML injection 75 mL (75 mLs Intravenous Contrast Given 01/12/19 0328)     Initial Impression / Assessment and Plan / ED Course  I have reviewed the triage vital signs and the nursing notes.  Pertinent labs & imaging results that were available during my care of the patient were reviewed by me and considered in my medical decision making (see chart for details).    low heart score, pending second troponin and reassess for likely discharge. No e/o dissection, PE or other acute causes for cp.  Final Clinical Impressions(s) / ED Diagnoses   Final diagnoses:  Nonspecific chest pain    ED Discharge Orders         Ordered    Ambulatory referral to Cardiology     01/12/19 0758           Karyme Mcconathy, Corene Cornea, MD 01/29/19 [phone]

## 2019-01-31 DIAGNOSIS — R002 Palpitations: Secondary | ICD-10-CM | POA: Diagnosis not present

## 2019-02-01 ENCOUNTER — Ambulatory Visit: Payer: PPO | Admitting: Gastroenterology

## 2019-02-08 DIAGNOSIS — R05 Cough: Secondary | ICD-10-CM | POA: Diagnosis not present

## 2019-02-10 DIAGNOSIS — R05 Cough: Secondary | ICD-10-CM | POA: Diagnosis not present

## 2019-02-15 ENCOUNTER — Telehealth: Payer: Self-pay

## 2019-02-15 ENCOUNTER — Other Ambulatory Visit: Payer: Self-pay

## 2019-02-15 ENCOUNTER — Encounter: Payer: Self-pay | Admitting: Gastroenterology

## 2019-02-15 ENCOUNTER — Telehealth (INDEPENDENT_AMBULATORY_CARE_PROVIDER_SITE_OTHER): Payer: PPO | Admitting: Gastroenterology

## 2019-02-15 VITALS — Ht 64.0 in | Wt 200.0 lb

## 2019-02-15 DIAGNOSIS — K5900 Constipation, unspecified: Secondary | ICD-10-CM | POA: Diagnosis not present

## 2019-02-15 DIAGNOSIS — R109 Unspecified abdominal pain: Secondary | ICD-10-CM | POA: Diagnosis not present

## 2019-02-15 DIAGNOSIS — K2100 Gastro-esophageal reflux disease with esophagitis, without bleeding: Secondary | ICD-10-CM | POA: Diagnosis not present

## 2019-02-15 DIAGNOSIS — R0789 Other chest pain: Secondary | ICD-10-CM

## 2019-02-15 NOTE — Telephone Encounter (Signed)
Called patient and left voicemail for patient to call back.

## 2019-02-15 NOTE — Patient Instructions (Signed)
If you are age 77 or older, your body mass index should be between 23-30. Your Body mass index is 34.33 kg/m. If this is out of the aforementioned range listed, please consider follow up with your Primary Care Provider.  If you are age 57 or younger, your body mass index should be between 19-25. Your Body mass index is 34.33 kg/m. If this is out of the aformentioned range listed, please consider follow up with your Primary Care Provider.   Follow up 12 weeks.   Thank you,  Dr. Jackquline Denmark

## 2019-02-15 NOTE — Progress Notes (Signed)
Chief Complaint: FU from hospital  Referring Provider:  Nicoletta Dress, MD      ASSESSMENT AND PLAN;   #1. GERD with Atypical chest pressure. She has appt with Dr Harriet Masson (cardiology) on 12/28.  #2.  Chronic abdominal pain, neg CTA 01/12/2019, neg CT scan 08/2017, 01/2019 except for constipation and rectal impaction s/p manual disimpaction.  #3. 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 Neg CTA 01/2019.  Plan: - Continue Pravacid 30mg  po qd. if she has any further chest pain, she can increase it to twice daily. - Continue benefiber, miralax 17g bid - Linzess 228mcg po QD to continue. - Continue prunes. - FU in 12 weeks in person.  Earlier, if with any new problems.  Would consider EGD/colonoscopy at FU visit.  Would like cardiology WU to be complete.   HPI:    Karen Castillo is a 78 y.o. female  [facility] admission to [facility]-atypical chest pain- neg cardiac eval including cardiac CT.  She currently has cardiac monitor and has follow-up appointment with cardiology in next few days.  Also underwent CTA chest Abdo/pelvis 01/12/2019 which was unremarkable except for mild constipation.  Does feel better.  Would occasionally have heartburn with waterbrash.  No odynophagia or dysphagia.  Has been taking Prevacid 30 mg every day.  She does get upper GI symptoms if she misses even a single dose.  S/p back Sx x 2 on dilaudid and Robaxin  Chronic constipation-better with Linzess, Benefiber and MiraLAX as needed.  Of note, in June had severe constipation associated with abdominal pain, underwent CT scan of the abdomen and pelvis which showed significant constipation and rectal impaction, requiring manual disimpaction. 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 Abdominal pain   . Acute post-operative pain 07/13/2017  . Allergic rhinitis   . Anemia   . Arthritis   . Bipolar disorder ([place])    patient denies  . Cancer (HCC)    SKIN    ,    LEFT BREAST   . Chronic bilateral low back pain 09/16/2016  . Chronic constipation   . Chronic pain syndrome   . Constipation   . 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 Dry eyes   . Dysuria   . Elevated cholesterol   . 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 Gallstones   . GERD (gastroesophageal reflux disease)   . Glaucoma    both eyes  . Gout   . Hypertension   . IBS (irritable bowel syndrome)   . Idiopathic peripheral neuropathy 06/11/2015  . 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 Ingrown nail of great toe of right foot 05/04/2017  . Insomnia   . Intractable low back pain 07/13/2017  . Low back pain 08/27/2017  . Nausea without vomiting   . Obesity   . Pain syndrome, chronic 11/29/2015  . Paronychia of great toe, right 05/04/2017  . Postoperative anemia due to acute blood loss 08/29/2017  . 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 Radiculopathy 05/04/2014  . Radiculopathy of lumbar region 11/29/2015  . Risk for falls 06/11/2015  . Schizophrenia ([place])   . Sleep apnea    USES CPAP   .  Sleep apnea, obstructive 04/03/2015  . Urticaria   . Vitamin deficiency     Past Surgical History:  Procedure Laterality Date  . ABDOMINAL EXPOSURE N/A 05/04/2014   Procedure: ABDOMINAL EXPOSURE;  Surgeon: Serafina Mitchell, MD;  Location: [facility];  Service: Vascular;  Laterality: N/A;  . ANTERIOR LUMBAR FUSION N/A 05/04/2014   Procedure: ANTERIOR LUMBAR FUSION 1 LEVEL;  Surgeon: Sinclair Ship, MD;  Location: [facility];  Service: Orthopedics;  Laterality: N/A;  Lumbar 5-sacrum 1 anterior lumbar interbody fusion with instrumentation and allograft  . ANTERIOR LUMBAR FUSION   07/2017  . ANTERIOR LUMBAR FUSION  08/2017  . BACK SURGERY     2010 ,2015LUMB FUSION, 06/2013 [facility] FUSION  . BACK SURGERY  07/2017   Lumbar  . BREAST BIOPSY  2013   pt said it was positive   . BREAST SURGERY     LEFT BRREAST BX  . CARPAL TUNNEL RELEASE     1994 RT  . CARPAL TUNNEL RELEASE Bilateral   . CATARACT EXTRACTION, BILATERAL  2017  . CHOLECYSTECTOMY     2004  . COLONOSCOPY W/ BIOPSIES     Colonoscopy AO:[id number], 2009, 2011  . ERCP     2009  . ESOPHAGOGASTRODUODENOSCOPY  08/09/2017   Small hiatal hernia. Mild gastritis. Incidenta; duodenal diverticulum. Incidental gastric polyp.   Marland Kitchen LEEP     1992 and 1994  . SKIN CANCER EXCISION     X 4   . thorocolumbar fusion  06/2013  . TUBAL LIGATION      Family History  Problem Relation Age of Onset  . Diabetes Mother        had a history but not anymore  . Prostate cancer Father   . Stroke Sister        has a brain tumor  . Colon cancer Neg Hx   . Esophageal cancer Neg Hx     Social History   Tobacco Use  . Smoking status: Never Smoker  . Smokeless tobacco: Never Used  Substance Use Topics  . Alcohol use: No  . Drug use: No    Current Outpatient Medications  Medication Sig Dispense Refill  . albuterol (VENTOLIN HFA) 108 (90 Base) MCG/ACT inhaler 2 puffs every 4 (four) hours as needed.    Mar[facility] Kitchen alendronate (FOSAMAX) 70 MG tablet Take 70 mg by mouth every Thursday. Take with a full glass of water on an empty stomach.    Marland Kitchen allopurinol (ZYLOPRIM) 100 MG tablet Take 100 mg by mouth daily.    Marland Kitchen amLODipine (NORVASC) 10 MG tablet Take 10 mg by mouth daily.    Marland Kitchen aspirin EC 81 MG tablet Take 81 mg by mouth daily.    . calcium carbonate (OSCAL) 1500 (600 Ca) MG TABS tablet Take 600 mg of elemental calcium by mouth 3 (three) times daily with meals.    . Cholecalciferol (VITAMIN D3) 2000 units TABS Take 1 tablet by mouth daily.    . cloNIDine (CATAPRES) 0.1 MG tablet Take 0.1 mg by mouth at bedtime.    . cycloSPORINE  (RESTASIS) 0.05 % ophthalmic emulsion Place 1 drop into both eyes 2 (two) times daily.     Marland Kitchen dextromethorphan (DELSYM) 30 MG/5ML liquid Take 10 mLs by mouth as needed for cough.    . hydrochlorothiazide (HYDRODIURIL) 25 MG tablet Take 25 mg by mouth daily.    Marland Kitchen HYDROmorphone (DILAUDID) 2 MG tablet Take 2 mg by mouth every 4 (four) hours.    . Lactobacillus (PROBIOTIC  ACIDOPHILUS PO) Take 1 tablet by mouth daily.    . lansoprazole (PREVACID) 30 MG capsule Take 1 capsule (30 mg total) by mouth daily before breakfast. 30 minutes before breakfast 30 capsule 0  . latanoprost (XALATAN) 0.005 % ophthalmic solution Place 1 drop into both eyes at bedtime.    Marland Kitchen linaclotide (LINZESS) 290 MCG CAPS capsule Take 1 capsule (290 mcg total) by mouth daily before breakfast. 30 capsule 11  . LIVALO 2 MG TABS Take 1 tablet by mouth at bedtime.    . methocarbamol (ROBAXIN) 750 MG tablet Take 750 mg by mouth 3 (three) times daily.    . nadolol (CORGARD) 40 MG tablet Take 40 mg by mouth at bedtime.    . nitroGLYCERIN (NITROSTAT) 0.4 MG SL tablet Place 1 tablet (0.4 mg total) under the tongue every 5 (five) minutes as needed for chest pain. 30 tablet 0  . Omega-3 Fatty Acids (FISH OIL) 600 MG CAPS Take 600 mg by mouth 2 (two) times daily.    . ondansetron (ZOFRAN) 4 MG tablet Take 1 tablet (4 mg total) by mouth daily as needed for nausea or vomiting. 20 tablet 0  . oxybutynin (DITROPAN) 5 MG tablet Take 5 mg by mouth 2 (two) times daily.    . polyethylene glycol (MIRALAX / GLYCOLAX) 17 g packet Take 17 g by mouth daily as needed for mild constipation. (Patient taking differently: Take 17 g by mouth as needed for mild constipation. )    . spironolactone (ALDACTONE) 25 MG tablet Take 25 mg by mouth daily.    . timolol (BETIMOL) 0.5 % ophthalmic solution Place 1 drop into both eyes daily. In the morning    . traZODone (DESYREL) 100 MG tablet Take 200 mg by mouth at bedtime.     Marland Kitchen trimethoprim (TRIMPEX) 100 MG tablet Take  100 mg by mouth daily.    . valsartan (DIOVAN) 80 MG tablet Take 80 mg by mouth daily.    . Wheat Dextrin (BENEFIBER) POWD Take 1 packet by mouth daily. Clear and Sugar FREE. 1 tablespoon daily     No current facility-administered medications for this visit.     Allergies  Allergen Reactions  . Ciprofloxacin Swelling    Tongue swells  . Cyclobenzaprine Swelling    Feet swell   . Pregabalin Anaphylaxis, Shortness Of Breath, Swelling and Other (See Comments)    Dizziness also  . Fluvastatin Other (See Comments)    Leg pain   . Meloxicam Swelling    Feet became swollen  . Metaxalone Rash and Other (See Comments)    Flushing of the skin and sore throat, too  . Pramipexole Nausea And Vomiting  . Pravastatin Other (See Comments)    Leg pain  . Rosuvastatin Other (See Comments)    Leg pain  . Sertraline Other (See Comments)    Sensitive teeth  . Statins Other (See Comments)    Leg pain     . Other     Darvocet  . Pravachol [Pravastatin Sodium]     Upper leg pain  . Amitriptyline Other (See Comments)    Weird feeling all over  . Arthrotec [Diclofenac-Misoprostol] Diarrhea  . Augmentin [Amoxicillin-Pot Clavulanate] Diarrhea    Has patient had a PCN reaction causing immediate rash, facial/tongue/throat swelling, SOB or lightheadedness with hypotension: No Has patient had a PCN reaction causing severe rash involving mucus membranes or skin necrosis: No Has patient had a PCN reaction that required hospitalization: No Has patient had a PCN  reaction occurring within the last 10 years: No If all of the above answers are "NO", then may proceed with Cephalosporin use.   . Bromfenac Diarrhea and Nausea Only  . Cephalexin Diarrhea and Nausea Only  . Conj Estrog-Medroxyprogest Ace Other (See Comments)    Unsure of reaction type Couldn't tolerate  . Diclofenac-Misoprostol Nausea Only and Diarrhea  . Erythromycin Diarrhea and Nausea Only  . Esomeprazole Diarrhea  . Esomeprazole  Magnesium Nausea Only  . Levofloxacin Other (See Comments) and Nausea Only    Stomach upset  . Methadone Diarrhea  . Metoclopramide Diarrhea and Nausea Only  . Oxycodone Hives, Swelling and Rash  . Propoxyphene Diarrhea and Nausea Only  . Rofecoxib Diarrhea and Nausea Only  . Sulfur Diarrhea and Nausea Only  . Tramadol Diarrhea  . Zoloft [Sertraline Hcl] Other (See Comments)    Sensitivity with teeth    Review of Systems:  neg     Physical Exam:    Ht 5\' 4"  (1.626 m)   Wt 200 lb (90.7 kg)   BMI 34.33 kg/m  Filed Weights   02/15/19 1037  Weight: 200 lb (90.7 kg)   Not examined since it was a televisit.  Data Reviewed: I have personally reviewed following labs and imaging studies  CBC: CBC Latest Ref Rng & Units 01/17/2019 01/16/2019 01/15/2019  WBC 4.0 - 10.5 K/uL 8.1 8.2 8.1  Hemoglobin 12.0 - 15.0 g/dL 11.9(L) 11.6(L) 12.5  Hematocrit 36.0 - 46.0 % 36.2 35.9(L) 38.0  Platelets 150 - 400 K/uL 245 265 282    CMP: CMP Latest Ref Rng & Units 01/17/2019 01/16/2019 01/15/2019  Glucose 70 - 99 mg/dL 97 119(H) 122(H)  BUN 8 - 23 mg/dL 9 12 12   Creatinine 0.44 - 1.00 mg/dL 0.74 0.95 0.85  Sodium 135 - 145 mmol/L 137 138 134(L)  Potassium 3.5 - 5.1 mmol/L 3.9 3.7 3.7  Chloride 98 - 111 mmol/L 103 105 101  CO2 22 - 32 mmol/L 24 22 23   Calcium 8.9 - 10.3 mg/dL 8.7(L) 9.0 9.2  Total Protein 6.5 - 8.1 g/dL 6.5 - -  Total Bilirubin 0.3 - 1.2 mg/dL 0.6 - -  Alkaline Phos 38 - 126 U/L 82 - -  AST 15 - 41 U/L 32 - -  ALT 0 - 44 U/L 44 - -  I connected with  Vivien Presto on 02/15/19 by a video enabled telemedicine application and verified that I am speaking with the correct person using two identifiers.   I discussed the limitations of evaluation and management by telemedicine. The patient expressed understanding and agreed to proceed.      Carmell Austria, MD 02/15/2019, 1:33 PM  Cc: Nicoletta Dress, MD

## 2019-02-21 DIAGNOSIS — Z6835 Body mass index (BMI) 35.0-35.9, adult: Secondary | ICD-10-CM | POA: Diagnosis not present

## 2019-02-21 DIAGNOSIS — J189 Pneumonia, unspecified organism: Secondary | ICD-10-CM | POA: Diagnosis not present

## 2019-02-21 DIAGNOSIS — R002 Palpitations: Secondary | ICD-10-CM | POA: Diagnosis not present

## 2019-03-01 ENCOUNTER — Other Ambulatory Visit: Payer: Self-pay

## 2019-03-01 ENCOUNTER — Ambulatory Visit: Payer: PPO | Admitting: Pulmonary Disease

## 2019-03-01 ENCOUNTER — Encounter: Payer: Self-pay | Admitting: Pulmonary Disease

## 2019-03-01 ENCOUNTER — Encounter: Payer: Self-pay | Admitting: Gastroenterology

## 2019-03-01 VITALS — BP 138/66 | HR 74 | Temp 98.7°F | Ht 64.0 in | Wt 208.4 lb

## 2019-03-01 DIAGNOSIS — R490 Dysphonia: Secondary | ICD-10-CM | POA: Diagnosis not present

## 2019-03-01 MED ORDER — BENZONATATE 200 MG PO CAPS: 200.0000 mg | ORAL_CAPSULE | Freq: Three times a day (TID) | ORAL | 2 refills | Status: DC | PRN

## 2019-03-01 NOTE — Patient Instructions (Addendum)
Cough and hoarseness  ENT referral for endoscopy Prescription for benzonatate  Alternative is promethazine with codeine(warning with allergy to oxycodone)  I will see you in about 4 weeks  If hoarseness and cough is better at that time we do not need to do any invasive testing  Call with significant concerns

## 2019-03-01 NOTE — Progress Notes (Signed)
Subjective:    Patient ID: Karen Castillo, female    DOB: 04-Mar-1941, 78 y.o.   MRN: [id number]  Patient with 1 month history of cough, hoarseness  Had a respiratory infection for which she was treated with courses of antibiotics Cough is much better but still persistent Hoarseness has persisted since the cough and infection  Denies any other associated symptoms No significant shortness of breath No significant nasal stuffiness or congestion or postnasal drip She does have a history of reflux-symptoms well controlled with reflux medications  No history of smoking  No pertinent occupational history  Has had hoarseness in the past with respiratory infections  Recently had a CT-shows no pathological findings that can explain hoarseness   Past Medical History:  Diagnosis Date  . Abdominal pain   . Acute post-operative pain 07/13/2017  . Allergic rhinitis   . Anemia   . Arthritis   . Bipolar disorder ([place])    patient denies  . Cancer (HCC)    SKIN    ,    LEFT BREAST   . Chronic bilateral low back pain 09/16/2016  . Chronic constipation   . Chronic pain syndrome   . Constipation   . 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 Dry eyes   . Dysuria   . Elevated cholesterol   . 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 Gallstones   . GERD (gastroesophageal reflux disease)   . Glaucoma    both eyes  . Gout   . Hypertension   . IBS (irritable bowel syndrome)   . Idiopathic peripheral neuropathy 06/11/2015  . 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 Ingrown nail of great toe of right foot 05/04/2017  . Insomnia   . Intractable low back pain 07/13/2017  . Low back pain 08/27/2017  . Nausea without vomiting   . Obesity   . Pain syndrome, chronic 11/29/2015  . Paronychia of great toe, right 05/04/2017  . Postoperative anemia due to acute blood loss 08/29/2017  . 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 Radiculopathy 05/04/2014  . Radiculopathy of lumbar region 11/29/2015  .  Risk for falls 06/11/2015  . Schizophrenia ([place])   . Sleep apnea    USES CPAP   . Sleep apnea, obstructive 04/03/2015  . Urticaria   . Vitamin deficiency    Social History   Socioeconomic History  . Marital status: Widowed    Spouse name: Not on file  . Number of children: 2  . Years of education: Not on file  . Highest education level: Not on file  Occupational History  . Occupation: Retired  Tobacco Use  . Smoking status: Never Smoker  . Smokeless tobacco: Never Used  Substance and Sexual Activity  . Alcohol use: No  . Drug use: No  . Sexual activity: Not on file  Other Topics Concern  . Not on file  Social History Narrative  . Not on file   Social Determinants of Health   Financial Resource Strain:   . Difficulty of Paying Living Expenses: Not on file  Food Insecurity:   . Worried About Charity fundraiser in the Last Year: Not on file  . Ran Out of Food in the Last Year: Not on file  Transportation Needs:   . Lack of Transportation (Medical): Not on file  . Lack of Transportation (Non-Medical): Not on file  Physical Activity:   . Days of Exercise per Week: Not on file  . Minutes of Exercise per Session:  Not on file  Stress:   . Feeling of Stress : Not on file  Social Connections:   . Frequency of Communication with Friends and Family: Not on file  . Frequency of Social Gatherings with Friends and Family: Not on file  . Attends Religious Services: Not on file  . Active Member of Clubs or Organizations: Not on file  . Attends Archivist Meetings: Not on file  . Marital Status: Not on file  Intimate Partner Violence:   . Fear of Current or Ex-Partner: Not on file  . Emotionally Abused: Not on file  . Physically Abused: Not on file  . Sexually Abused: Not on file   Family History  Problem Relation Age of Onset  . Diabetes Mother        had a history but not anymore  . Prostate cancer Father   . Stroke Sister        has a brain tumor  . Colon  cancer Neg Hx   . Esophageal cancer Neg Hx    Review of Systems  Constitutional: Negative for fever and unexpected weight change.  HENT: Positive for sore throat. Negative for congestion, dental problem, ear pain, nosebleeds, postnasal drip, rhinorrhea, sinus pressure, sneezing and trouble swallowing.   Eyes: Negative for redness and itching.  Respiratory: Positive for cough. Negative for chest tightness, shortness of breath and wheezing.   Cardiovascular: Positive for leg swelling. Negative for palpitations.  Gastrointestinal: Positive for nausea. Negative for vomiting.  Genitourinary: Positive for dysuria.  Musculoskeletal: Negative for joint swelling.  Skin: Negative for rash.  Allergic/Immunologic: Negative.  Negative for environmental allergies, food allergies and immunocompromised state.  Neurological: Negative for headaches.  Hematological: Bruises/bleeds easily.  Psychiatric/Behavioral: Negative for dysphoric mood. The patient is not nervous/anxious.       Objective:   Physical Exam Constitutional:      Appearance: She is obese.  HENT:     Head: Normocephalic and atraumatic.     Mouth/Throat:     Mouth: Mucous membranes are moist.  Eyes:     Pupils: Pupils are equal, round, and reactive to light.  Neck:     Vascular: No carotid bruit.  Cardiovascular:     Rate and Rhythm: Normal rate and regular rhythm.     Pulses: Normal pulses.     Heart sounds: Normal heart sounds. No murmur. No friction rub.  Pulmonary:     Effort: Pulmonary effort is normal. No respiratory distress.     Breath sounds: Normal breath sounds. No stridor. No wheezing.  Musculoskeletal:        General: Normal range of motion.     Cervical back: Neck supple. No rigidity or tenderness.  Lymphadenopathy:     Cervical: No cervical adenopathy.  Skin:    General: Skin is warm.  Neurological:     General: No focal deficit present.     Mental Status: She is alert and oriented to person, place, and time.   Psychiatric:        Mood and Affect: Mood normal.        Behavior: Behavior normal.    Vitals:   03/01/19 1539  BP: 138/66  Pulse: 74  Temp: 98.7 F (37.1 C)  SpO2: 93%   CT  01/16/2019-no significant pathology noted    Assessment & Plan:  .  Cough and hoarseness  .  Cough is better but still persistent  .  Hoarseness is unchanged -Has no associated symptoms -No history of lung  disease -No history of allergies -No nasal stuffiness or congestion -She does have a history of reflux-symptoms well controlled with current regimen -No history suggestive of vocal cord spasm  .  Plan: Referral to ENT for possible endoscopy  Tessalon Perles for cough  Promethazine with codeine may also be considered however she is allergic to oxycodone- long list of allergies  If no abnormalities found by ENT on endoscopy, a bronchoscopy may be considered if hoarseness still persists  Will see in 4 weeks

## 2019-03-07 ENCOUNTER — Ambulatory Visit: Payer: PPO | Admitting: Cardiology

## 2019-03-09 ENCOUNTER — Other Ambulatory Visit: Payer: PPO

## 2019-03-17 DIAGNOSIS — G4700 Insomnia, unspecified: Secondary | ICD-10-CM | POA: Diagnosis not present

## 2019-03-17 DIAGNOSIS — Z79899 Other long term (current) drug therapy: Secondary | ICD-10-CM | POA: Diagnosis not present

## 2019-03-17 DIAGNOSIS — M62830 Muscle spasm of back: Secondary | ICD-10-CM | POA: Diagnosis not present

## 2019-03-17 DIAGNOSIS — M545 Low back pain: Secondary | ICD-10-CM | POA: Diagnosis not present

## 2019-03-22 DIAGNOSIS — N390 Urinary tract infection, site not specified: Secondary | ICD-10-CM | POA: Diagnosis not present

## 2019-03-22 DIAGNOSIS — N3946 Mixed incontinence: Secondary | ICD-10-CM | POA: Diagnosis not present

## 2019-03-28 DIAGNOSIS — L814 Other melanin hyperpigmentation: Secondary | ICD-10-CM | POA: Diagnosis not present

## 2019-03-28 DIAGNOSIS — L820 Inflamed seborrheic keratosis: Secondary | ICD-10-CM | POA: Diagnosis not present

## 2019-03-28 DIAGNOSIS — D1801 Hemangioma of skin and subcutaneous tissue: Secondary | ICD-10-CM | POA: Diagnosis not present

## 2019-03-28 DIAGNOSIS — L728 Other follicular cysts of the skin and subcutaneous tissue: Secondary | ICD-10-CM | POA: Diagnosis not present

## 2019-03-28 DIAGNOSIS — D225 Melanocytic nevi of trunk: Secondary | ICD-10-CM | POA: Diagnosis not present

## 2019-03-28 DIAGNOSIS — L570 Actinic keratosis: Secondary | ICD-10-CM | POA: Diagnosis not present

## 2019-03-28 DIAGNOSIS — L821 Other seborrheic keratosis: Secondary | ICD-10-CM | POA: Diagnosis not present

## 2019-03-28 DIAGNOSIS — R208 Other disturbances of skin sensation: Secondary | ICD-10-CM | POA: Diagnosis not present

## 2019-03-30 ENCOUNTER — Ambulatory Visit: Payer: PPO | Admitting: Pulmonary Disease

## 2019-03-30 ENCOUNTER — Telehealth: Payer: Self-pay | Admitting: Pulmonary Disease

## 2019-04-15 DIAGNOSIS — G4700 Insomnia, unspecified: Secondary | ICD-10-CM | POA: Diagnosis not present

## 2019-04-15 DIAGNOSIS — M129 Arthropathy, unspecified: Secondary | ICD-10-CM | POA: Diagnosis not present

## 2019-04-15 DIAGNOSIS — R300 Dysuria: Secondary | ICD-10-CM | POA: Diagnosis not present

## 2019-04-15 DIAGNOSIS — Z79899 Other long term (current) drug therapy: Secondary | ICD-10-CM | POA: Diagnosis not present

## 2019-04-15 DIAGNOSIS — M545 Low back pain: Secondary | ICD-10-CM | POA: Diagnosis not present

## 2019-04-15 DIAGNOSIS — D539 Nutritional anemia, unspecified: Secondary | ICD-10-CM | POA: Diagnosis not present

## 2019-04-15 DIAGNOSIS — M5136 Other intervertebral disc degeneration, lumbar region: Secondary | ICD-10-CM | POA: Diagnosis not present

## 2019-04-15 DIAGNOSIS — E559 Vitamin D deficiency, unspecified: Secondary | ICD-10-CM | POA: Diagnosis not present

## 2019-04-15 DIAGNOSIS — E7800 Pure hypercholesterolemia, unspecified: Secondary | ICD-10-CM | POA: Diagnosis not present

## 2019-04-15 DIAGNOSIS — Z1159 Encounter for screening for other viral diseases: Secondary | ICD-10-CM | POA: Diagnosis not present

## 2019-04-19 ENCOUNTER — Encounter: Payer: Self-pay | Admitting: Cardiology

## 2019-04-19 ENCOUNTER — Other Ambulatory Visit: Payer: Self-pay

## 2019-04-19 ENCOUNTER — Ambulatory Visit (INDEPENDENT_AMBULATORY_CARE_PROVIDER_SITE_OTHER): Payer: PPO | Admitting: Cardiology

## 2019-04-19 VITALS — BP 120/60 | HR 70 | Ht 64.0 in | Wt 205.0 lb

## 2019-04-19 DIAGNOSIS — I2510 Atherosclerotic heart disease of native coronary artery without angina pectoris: Secondary | ICD-10-CM

## 2019-04-19 DIAGNOSIS — I10 Essential (primary) hypertension: Secondary | ICD-10-CM

## 2019-04-19 DIAGNOSIS — E782 Mixed hyperlipidemia: Secondary | ICD-10-CM

## 2019-04-19 NOTE — Progress Notes (Signed)
Cardiology Office Note:    Date:  04/19/2019   ID:  Karen Castillo, Karen Castillo 06/30/1940, MRN [id number]  PCP:  Nicoletta Dress, MD  Cardiologist:  No primary care provider on file.  Electrophysiologist:  None   Referring MD: Nicoletta Dress, MD   Chief Complaint  Patient presents with  . Follow-up    History of Present Illness:    Karen Castillo is a 79 y.o. female with a hx of coronary artery disease by coronary CTA, hypertension, hyperlipidemia, sleep apnea presents today for follow-up visit.  She was hospitalized at [facility] in November at which time she had a coronary CTA which showed moderate coronary disease with no obstructive component she also did see GI for gastroesophageal reflux disease as well.  At that time during her hospitalization it was thought to be noncardiac chest pain.  She has not had any repeat chest pain since.  Past Medical History:  Diagnosis Date  . Abdominal pain   . Acute post-operative pain 07/13/2017  . Allergic rhinitis   . Anemia   . Arthritis   . Bipolar disorder ([place])    patient denies  . Cancer (HCC)    SKIN    ,    LEFT BREAST   . Chronic bilateral low back pain 09/16/2016  . Chronic constipation   . Chronic pain syndrome   . Constipation   . 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 Dry eyes   . Dysuria   . Elevated cholesterol   . 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 Gallstones   . GERD (gastroesophageal reflux disease)   . Glaucoma    both eyes  . Gout   . Hypertension   . IBS (irritable bowel syndrome)   . Idiopathic peripheral neuropathy 06/11/2015  . 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 Ingrown nail of great toe of right foot 05/04/2017  . Insomnia   . Intractable low back pain 07/13/2017  . Low back pain 08/27/2017  . Nausea without vomiting   . Obesity   . Pain syndrome, chronic 11/29/2015  . Paronychia of great toe, right 05/04/2017  . Postoperative anemia due to acute blood loss 08/29/2017    . 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 Radiculopathy 05/04/2014  . Radiculopathy of lumbar region 11/29/2015  . Risk for falls 06/11/2015  . Schizophrenia ([place])   . Sleep apnea    USES CPAP   . Sleep apnea, obstructive 04/03/2015  . Urticaria   . Vitamin deficiency     Past Surgical History:  Procedure Laterality Date  . ABDOMINAL EXPOSURE N/A 05/04/2014   Procedure: ABDOMINAL EXPOSURE;  Surgeon: Serafina Mitchell, MD;  Location: [facility];  Service: Vascular;  Laterality: N/A;  . ANTERIOR LUMBAR FUSION N/A 05/04/2014   Procedure: ANTERIOR LUMBAR FUSION 1 LEVEL;  Surgeon: Sinclair Ship, MD;  Location: [place];  Service: Orthopedics;  Laterality: N/A;  Lumbar 5-sacrum 1 anterior lumbar interbody fusion with instrumentation and allograft  . ANTERIOR LUMBAR FUSION  07/2017  . ANTERIOR LUMBAR FUSION  08/2017  . BACK SURGERY     2010 ,2015LUMB FUSION, 06/2013 [facility] FUSION  . BACK SURGERY  07/2017   Lumbar  . BREAST BIOPSY  2013   pt said it was positive   . BREAST SURGERY     LEFT BRREAST BX  . CARPAL TUNNEL RELEASE     1994 RT  . CARPAL TUNNEL RELEASE Bilateral   . CATARACT EXTRACTION, BILATERAL  2017  .  CHOLECYSTECTOMY     2004  . COLONOSCOPY W/ BIOPSIES     Colonoscopy [id number], 2009, 2011  . ERCP     2009  . ESOPHAGOGASTRODUODENOSCOPY  08/09/2017   Small hiatal hernia. Mild gastritis. Incidenta; duodenal diverticulum. Incidental gastric polyp.   Marland Kitchen LEEP     1992 and 1994  . SKIN CANCER EXCISION     X 4   . thorocolumbar fusion  06/2013  . TUBAL LIGATION      Current Medications: Current Meds  Medication Sig  . alendronate (FOSAMAX) 70 MG tablet Take 70 mg by mouth every Thursday. Take with a full glass of water on an empty stomach.  Marland Kitchen allopurinol (ZYLOPRIM) 100 MG tablet Take 100 mg by mouth daily.  Marland Kitchen ascorbic acid (VITAMIN C) 500 MG tablet Take 1,000 mg by mouth daily.  Marland Kitchen aspirin EC 81 MG tablet Take 81 mg by mouth daily.  . Biotin 10 MG TABS Take 10 mg by mouth  daily.  . calcium carbonate (OSCAL) 1500 (600 Ca) MG TABS tablet Take 600 mg of elemental calcium by mouth 3 (three) times daily with meals.  . Cholecalciferol (VITAMIN D3) 2000 units TABS Take 1 tablet by mouth daily.  . cloNIDine (CATAPRES) 0.1 MG tablet Take 0.1 mg by mouth at bedtime.  . cycloSPORINE (RESTASIS) 0.05 % ophthalmic emulsion Place 1 drop into both eyes 2 (two) times daily.   . hydrochlorothiazide (HYDRODIURIL) 25 MG tablet Take 25 mg by mouth daily.  Marland Kitchen HYDROmorphone (DILAUDID) 2 MG tablet Take 2 mg by mouth every 4 (four) hours.  . Lactobacillus (PROBIOTIC ACIDOPHILUS PO) Take 1 tablet by mouth daily.  . lansoprazole (PREVACID) 30 MG capsule Take 1 capsule (30 mg total) by mouth daily before breakfast. 30 minutes before breakfast  . latanoprost (XALATAN) 0.005 % ophthalmic solution Place 1 drop into both eyes at bedtime.  Marland Kitchen linaclotide (LINZESS) 290 MCG CAPS capsule Take 1 capsule (290 mcg total) by mouth daily before breakfast.  . LIVALO 2 MG TABS Take 1 tablet by mouth at bedtime.  . Magnesium (V-R MAGNESIUM) 250 MG TABS Take 250 mg by mouth daily.  . methocarbamol (ROBAXIN) 750 MG tablet Take 750 mg by mouth 3 (three) times daily.  . nadolol (CORGARD) 40 MG tablet Take 40 mg by mouth at bedtime.  . nitroGLYCERIN (NITROSTAT) 0.4 MG SL tablet Place 1 tablet (0.4 mg total) under the tongue every 5 (five) minutes as needed for chest pain.  . Omega-3 Fatty Acids (FISH OIL) 600 MG CAPS Take 600 mg by mouth 2 (two) times daily.  Marland Kitchen oxybutynin (DITROPAN) 5 MG tablet Take 5 mg by mouth 2 (two) times daily.  . polyethylene glycol (MIRALAX / GLYCOLAX) 17 g packet Take 17 g by mouth daily as needed for mild constipation. (Patient taking differently: Take 17 g by mouth as needed for mild constipation. )  . spironolactone (ALDACTONE) 25 MG tablet Take 25 mg by mouth daily.  . timolol (BETIMOL) 0.5 % ophthalmic solution Place 1 drop into both eyes daily. In the morning  . traZODone (DESYREL)  100 MG tablet Take 200 mg by mouth at bedtime.   Marland Kitchen trimethoprim (TRIMPEX) 100 MG tablet Take 100 mg by mouth daily.  . valsartan (DIOVAN) 80 MG tablet Take 80 mg by mouth daily.  . Wheat Dextrin (BENEFIBER) POWD Take 1 packet by mouth daily. Clear and Sugar FREE. 1 tablespoon daily  . [DISCONTINUED] albuterol (VENTOLIN HFA) 108 (90 Base) MCG/ACT inhaler 2 puffs every 4 (four)  hours as needed.     Allergies:   Ciprofloxacin, Cyclobenzaprine, Pregabalin, Fluvastatin, Meloxicam, Metaxalone, Pramipexole, Pravastatin, Rosuvastatin, Sertraline, Statins, Other, Pravachol [pravastatin sodium], Amitriptyline, Arthrotec [diclofenac-misoprostol], Augmentin [amoxicillin-pot clavulanate], Bromfenac, Cephalexin, Conj estrog-medroxyprogest ace, Diclofenac-misoprostol, Erythromycin, Esomeprazole, Esomeprazole magnesium, Levofloxacin, Methadone, Metoclopramide, Oxycodone, Propoxyphene, Rofecoxib, Sulfur, Tramadol, and Zoloft [sertraline hcl]   Social History   Socioeconomic History  . Marital status: Widowed    Spouse name: Not on file  . Number of children: 2  . Years of education: Not on file  . Highest education level: Not on file  Occupational History  . Occupation: Retired  Tobacco Use  . Smoking status: Never Smoker  . Smokeless tobacco: Never Used  Substance and Sexual Activity  . Alcohol use: No  . Drug use: No  . Sexual activity: Not on file  Other Topics Concern  . Not on file  Social History Narrative  . Not on file   Social Determinants of Health   Financial Resource Strain:   . Difficulty of Paying Living Expenses: Not on file  Food Insecurity:   . Worried About Charity fundraiser in the Last Year: Not on file  . Ran Out of Food in the Last Year: Not on file  Transportation Needs:   . Lack of Transportation (Medical): Not on file  . Lack of Transportation (Non-Medical): Not on file  Physical Activity:   . Days of Exercise per Week: Not on file  . Minutes of Exercise per  Session: Not on file  Stress:   . Feeling of Stress : Not on file  Social Connections:   . Frequency of Communication with Friends and Family: Not on file  . Frequency of Social Gatherings with Friends and Family: Not on file  . Attends Religious Services: Not on file  . Active Member of Clubs or Organizations: Not on file  . Attends Archivist Meetings: Not on file  . Marital Status: Not on file     Family History: The patient's family history includes Diabetes in her mother; Prostate cancer in her father; Stroke in her sister. There is no history of Colon cancer or Esophageal cancer.  ROS:   Review of Systems  Constitution: Negative for decreased appetite, fever and weight gain.  HENT: Negative for congestion, ear discharge, hoarse voice and sore throat.   Eyes: Negative for discharge, redness, vision loss in right eye and visual halos.  Cardiovascular: Negative for chest pain, dyspnea on exertion, leg swelling, orthopnea and palpitations.  Respiratory: Negative for cough, hemoptysis, shortness of breath and snoring.   Endocrine: Negative for heat intolerance and polyphagia.  Hematologic/Lymphatic: Negative for bleeding problem. Does not bruise/bleed easily.  Skin: Negative for flushing, nail changes, rash and suspicious lesions.  Musculoskeletal: Negative for arthritis, joint pain, muscle cramps, myalgias, neck pain and stiffness.  Gastrointestinal: Negative for abdominal pain, bowel incontinence, diarrhea and excessive appetite.  Genitourinary: Negative for decreased libido, genital sores and incomplete emptying.  Neurological: Negative for brief paralysis, focal weakness, headaches and loss of balance.  Psychiatric/Behavioral: Negative for altered mental status, depression and suicidal ideas.  Allergic/Immunologic: Negative for HIV exposure and persistent infections.    EKGs/Labs/Other Studies Reviewed:    The following studies were reviewed today:   EKG: None  today  Coronary CTA IMPRESSION: 1. Normal aortic root 3.2 cm with moderate calcific atherosclerosis  2. Calcium score 1213 involving LM and 3 vessels this is 95 th percentile for age and sex  58. CAD see description above study  sent for FFR CT to r/o obstructive disease in mid RCA and proximal LAD  Recent Labs: 01/15/2019: B Natriuretic Peptide 92.4 01/17/2019: ALT 44; BUN 9; Creatinine, Ser 0.74; Hemoglobin 11.9; Magnesium 2.2; Platelets 245; Potassium 3.9; Sodium 137  Recent Lipid Panel No results found for: CHOL, TRIG, HDL, CHOLHDL, VLDL, LDLCALC, LDLDIRECT  Physical Exam:    VS:  BP 120/60 (BP Location: Right Arm, Patient Position: Sitting, Cuff Size: Large)   Pulse 70   Ht 5\' 4"  (1.626 m)   Wt 205 lb (93 kg)   SpO2 93%   BMI 35.19 kg/m     Wt Readings from Last 3 Encounters:  04/19/19 205 lb (93 kg)  03/01/19 208 lb 6.4 oz (94.5 kg)  02/15/19 200 lb (90.7 kg)     GEN: Well nourished, well developed in no acute distress HEENT: Normal NECK: No JVD; No carotid bruits LYMPHATICS: No lymphadenopathy CARDIAC: S1S2 noted,RRR, no murmurs, rubs, gallops RESPIRATORY:  Clear to auscultation without rales, wheezing or rhonchi  ABDOMEN: Soft, non-tender, non-distended, +bowel sounds, no guarding. EXTREMITIES: No edema, No cyanosis, no clubbing MUSCULOSKELETAL:  No deformity  SKIN: Warm and dry NEUROLOGIC:  Alert and oriented x 3, non-focal PSYCHIATRIC:  Normal affect, good insight  ASSESSMENT:    1. Coronary artery disease involving native coronary artery of native heart without angina pectoris   2. Essential hypertension   3. Mixed hyperlipidemia    PLAN:     She appears to be doing well from a cardiovascular standpoint.  Her blood pressure is acceptable at this time in the office.  I am going to continue patient on her current cardiovascular regimen.   The patient is in agreement with the above plan. The patient left the office in stable condition.  The patient  will follow up in 3 months or sooner if needed.   Medication Adjustments/Labs and Tests Ordered: Current medicines are reviewed at length with the patient today.  Concerns regarding medicines are outlined above.  No orders of the defined types were placed in this encounter.  No orders of the defined types were placed in this encounter.   Patient Instructions  Medication Instructions:  Your physician recommends that you continue on your current medications as directed. Please refer to the Current Medication list given to you today.  *If you need a refill on your cardiac medications before your next appointment, please call your pharmacy*  Lab Work: None If you have labs (blood work) drawn today and your tests are completely normal, you will receive your results only by: Marland Kitchen MyChart Message (if you have MyChart) OR . A paper copy in the mail If you have any lab test that is abnormal or we need to change your treatment, we will call you to review the results.  Testing/Procedures: None  Follow-Up: At [facility], you and your health needs are our priority.  As part of our continuing mission to provide you with exceptional heart care, we have created designated Provider Care Teams.  These Care Teams include your primary Cardiologist (physician) and Advanced Practice Providers (APPs -  Physician Assistants and Nurse Practitioners) who all work together to provide you with the care you need, when you need it.  Your next appointment:   3 month(s)  The format for your next appointment:   In Person  Provider:   Berniece Salines, DO  Other Instructions      Adopting a Healthy Lifestyle.  Know what a healthy weight is for you (roughly BMI <  25) and aim to maintain this   Aim for 7+ servings of fruits and vegetables daily   65-80+ fluid ounces of water or unsweet tea for healthy kidneys   Limit to max 1 drink of alcohol per day; avoid smoking/tobacco   Limit animal fats in diet for  cholesterol and heart health - choose grass fed whenever available   Avoid highly processed foods, and foods high in saturated/trans fats   Aim for low stress - take time to unwind and care for your mental health   Aim for 150 min of moderate intensity exercise weekly for heart health, and weights twice weekly for bone health   Aim for 7-9 hours of sleep daily   When it comes to diets, agreement about the perfect plan isnt easy to find, even among the experts. Experts at the [employer] developed an idea known as the Healthy Eating Plate. Just imagine a plate divided into logical, healthy portions.   The emphasis is on diet quality:   Load up on vegetables and fruits - one-half of your plate: Aim for color and variety, and remember that potatoes dont count.   Go for whole grains - one-quarter of your plate: Whole wheat, barley, wheat berries, quinoa, oats, brown rice, and foods made with them. If you want pasta, go with whole wheat pasta.   Protein power - one-quarter of your plate: Fish, chicken, beans, and nuts are all healthy, versatile protein sources. Limit red meat.   The diet, however, does go beyond the plate, offering a few other suggestions.   Use healthy plant oils, such as olive, canola, soy, corn, sunflower and peanut. Check the labels, and avoid partially hydrogenated oil, which have unhealthy trans fats.   If youre thirsty, drink water. Coffee and tea are good in moderation, but skip sugary drinks and limit milk and dairy products to one or two daily servings.   The type of carbohydrate in the diet is more important than the amount. Some sources of carbohydrates, such as vegetables, fruits, whole grains, and beans-are healthier than others.   Finally, stay active  Signed, Berniece Salines, DO  04/19/2019 3:58 PM    [facility]

## 2019-04-19 NOTE — Patient Instructions (Signed)
Medication Instructions:  Your physician recommends that you continue on your current medications as directed. Please refer to the Current Medication list given to you today.  *If you need a refill on your cardiac medications before your next appointment, please call your pharmacy*  Lab Work: None If you have labs (blood work) drawn today and your tests are completely normal, you will receive your results only by: Marland Kitchen MyChart Message (if you have MyChart) OR . A paper copy in the mail If you have any lab test that is abnormal or we need to change your treatment, we will call you to review the results.  Testing/Procedures: None  Follow-Up: At [facility], you and your health needs are our priority.  As part of our continuing mission to provide you with exceptional heart care, we have created designated Provider Care Teams.  These Care Teams include your primary Cardiologist (physician) and Advanced Practice Providers (APPs -  Physician Assistants and Nurse Practitioners) who all work together to provide you with the care you need, when you need it.  Your next appointment:   3 month(s)  The format for your next appointment:   In Person  Provider:   Berniece Salines, DO  Other Instructions

## 2019-05-13 DIAGNOSIS — M961 Postlaminectomy syndrome, not elsewhere classified: Secondary | ICD-10-CM | POA: Diagnosis not present

## 2019-05-13 DIAGNOSIS — M5136 Other intervertebral disc degeneration, lumbar region: Secondary | ICD-10-CM | POA: Diagnosis not present

## 2019-05-13 DIAGNOSIS — M545 Low back pain: Secondary | ICD-10-CM | POA: Diagnosis not present

## 2019-05-13 DIAGNOSIS — Z79899 Other long term (current) drug therapy: Secondary | ICD-10-CM | POA: Diagnosis not present

## 2019-05-13 DIAGNOSIS — M4057 Lordosis, unspecified, lumbosacral region: Secondary | ICD-10-CM | POA: Diagnosis not present

## 2019-05-30 DIAGNOSIS — F5105 Insomnia due to other mental disorder: Secondary | ICD-10-CM | POA: Diagnosis not present

## 2019-05-30 DIAGNOSIS — R739 Hyperglycemia, unspecified: Secondary | ICD-10-CM | POA: Diagnosis not present

## 2019-05-30 DIAGNOSIS — Z6836 Body mass index (BMI) 36.0-36.9, adult: Secondary | ICD-10-CM | POA: Diagnosis not present

## 2019-05-30 DIAGNOSIS — M85851 Other specified disorders of bone density and structure, right thigh: Secondary | ICD-10-CM | POA: Diagnosis not present

## 2019-05-30 DIAGNOSIS — I1310 Hypertensive heart and chronic kidney disease without heart failure, with stage 1 through stage 4 chronic kidney disease, or unspecified chronic kidney disease: Secondary | ICD-10-CM | POA: Diagnosis not present

## 2019-05-30 DIAGNOSIS — F419 Anxiety disorder, unspecified: Secondary | ICD-10-CM | POA: Diagnosis not present

## 2019-05-30 DIAGNOSIS — M85852 Other specified disorders of bone density and structure, left thigh: Secondary | ICD-10-CM | POA: Diagnosis not present

## 2019-05-30 DIAGNOSIS — E785 Hyperlipidemia, unspecified: Secondary | ICD-10-CM | POA: Diagnosis not present

## 2019-05-30 DIAGNOSIS — K219 Gastro-esophageal reflux disease without esophagitis: Secondary | ICD-10-CM | POA: Diagnosis not present

## 2019-05-30 DIAGNOSIS — N182 Chronic kidney disease, stage 2 (mild): Secondary | ICD-10-CM | POA: Diagnosis not present

## 2019-05-30 DIAGNOSIS — R05 Cough: Secondary | ICD-10-CM | POA: Diagnosis not present

## 2019-06-09 DIAGNOSIS — M961 Postlaminectomy syndrome, not elsewhere classified: Secondary | ICD-10-CM | POA: Diagnosis not present

## 2019-06-09 DIAGNOSIS — M62830 Muscle spasm of back: Secondary | ICD-10-CM | POA: Diagnosis not present

## 2019-06-09 DIAGNOSIS — M545 Low back pain: Secondary | ICD-10-CM | POA: Diagnosis not present

## 2019-06-09 DIAGNOSIS — M5136 Other intervertebral disc degeneration, lumbar region: Secondary | ICD-10-CM | POA: Diagnosis not present

## 2019-06-09 DIAGNOSIS — Z79899 Other long term (current) drug therapy: Secondary | ICD-10-CM | POA: Diagnosis not present

## 2019-06-16 DIAGNOSIS — M545 Low back pain: Secondary | ICD-10-CM | POA: Diagnosis not present

## 2019-07-08 DIAGNOSIS — Z79899 Other long term (current) drug therapy: Secondary | ICD-10-CM | POA: Diagnosis not present

## 2019-07-08 DIAGNOSIS — M961 Postlaminectomy syndrome, not elsewhere classified: Secondary | ICD-10-CM | POA: Diagnosis not present

## 2019-07-08 DIAGNOSIS — G4700 Insomnia, unspecified: Secondary | ICD-10-CM | POA: Diagnosis not present

## 2019-07-08 DIAGNOSIS — M5136 Other intervertebral disc degeneration, lumbar region: Secondary | ICD-10-CM | POA: Diagnosis not present

## 2019-07-08 DIAGNOSIS — M545 Low back pain: Secondary | ICD-10-CM | POA: Diagnosis not present

## 2019-07-18 ENCOUNTER — Other Ambulatory Visit: Payer: Self-pay

## 2019-07-19 ENCOUNTER — Encounter: Payer: Self-pay | Admitting: Cardiology

## 2019-07-19 ENCOUNTER — Other Ambulatory Visit: Payer: Self-pay

## 2019-07-19 ENCOUNTER — Ambulatory Visit: Payer: PPO | Admitting: Cardiology

## 2019-07-19 VITALS — BP 120/60 | HR 67 | Ht 64.0 in | Wt 207.0 lb

## 2019-07-19 DIAGNOSIS — E669 Obesity, unspecified: Secondary | ICD-10-CM | POA: Diagnosis not present

## 2019-07-19 DIAGNOSIS — E782 Mixed hyperlipidemia: Secondary | ICD-10-CM | POA: Diagnosis not present

## 2019-07-19 DIAGNOSIS — I10 Essential (primary) hypertension: Secondary | ICD-10-CM

## 2019-07-19 DIAGNOSIS — I2510 Atherosclerotic heart disease of native coronary artery without angina pectoris: Secondary | ICD-10-CM

## 2019-07-19 NOTE — Patient Instructions (Signed)

## 2019-07-19 NOTE — Progress Notes (Signed)
Cardiology Office Note:    Date:  07/19/2019   ID:  Karen, Castillo [birth year], MRN [id number]  PCP:  Nicoletta Dress, MD  Cardiologist:  Berniece Salines, DO  Electrophysiologist:  None   Referring MD: Nicoletta Dress, MD   Chief Complaint  Patient presents with  . Follow-up  " I have been doing well"   History of Present Illness:    Karen Castillo is a 79 y.o. female with a hx of coronary artery disease by coronary CTA, hypertension, hyperlipidemia, sleep apnea presents today for follow-up visit.  Past Medical History:  Diagnosis Date  . Abdominal pain   . Acute post-operative pain 07/13/2017  . Allergic rhinitis   . Anemia   . Arthritis   . Bipolar disorder ([place])    patient denies  . Cancer (HCC)    SKIN    ,    LEFT BREAST   . Chronic bilateral low back pain 09/16/2016  . Chronic constipation   . Chronic pain syndrome   . Constipation   . 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 Dry eyes   . Dysuria   . Elevated cholesterol   . 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 Gallstones   . GERD (gastroesophageal reflux disease)   . Glaucoma    both eyes  . Gout   . Hypertension   . IBS (irritable bowel syndrome)   . Idiopathic peripheral neuropathy 06/11/2015  . 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 Ingrown nail of great toe of right foot 05/04/2017  . Insomnia   . Intractable low back pain 07/13/2017  . Low back pain 08/27/2017  . Nausea without vomiting   . Obesity   . Pain syndrome, chronic 11/29/2015  . Paronychia of great toe, right 05/04/2017  . Postoperative anemia due to acute blood loss 08/29/2017  . 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 Radiculopathy 05/04/2014  . Radiculopathy of lumbar region 11/29/2015  . Risk for falls 06/11/2015  . Schizophrenia ([place])   . Sleep apnea    USES CPAP   . Sleep apnea, obstructive 04/03/2015  . Urticaria   . Vitamin deficiency     Past Surgical History:  Procedure Laterality Date  . ABDOMINAL  EXPOSURE N/A 05/04/2014   Procedure: ABDOMINAL EXPOSURE;  Surgeon: Serafina Mitchell, MD;  Location: [place];  Service: Vascular;  Laterality: N/A;  . ANTERIOR LUMBAR FUSION N/A 05/04/2014   Procedure: ANTERIOR LUMBAR FUSION 1 LEVEL;  Surgeon: Sinclair Ship, MD;  Location: [place];  Service: Orthopedics;  Laterality: N/A;  Lumbar 5-sacrum 1 anterior lumbar interbody fusion with instrumentation and allograft  . ANTERIOR LUMBAR FUSION  07/2017  . ANTERIOR LUMBAR FUSION  08/2017  . BACK SURGERY     2010 ,2015LUMB FUSION, 06/2013 [facility] FUSION  . BACK SURGERY  07/2017   Lumbar  . BREAST BIOPSY  2013   pt said it was positive   . BREAST SURGERY     LEFT BRREAST BX  . CARPAL TUNNEL RELEASE     1994 RT  . CARPAL TUNNEL RELEASE Bilateral   . CATARACT EXTRACTION, BILATERAL  2017  . CHOLECYSTECTOMY     2004  . COLONOSCOPY W/ BIOPSIES     Colonoscopy 7829,5621, 2009, 2011  . ERCP     2009  . ESOPHAGOGASTRODUODENOSCOPY  08/09/2017   Small hiatal hernia. Mild gastritis. Incidenta; duodenal diverticulum. Incidental gastric polyp.   Marland Kitchen LEEP     1992 and 1994  . SKIN  CANCER EXCISION     X 4   . thorocolumbar fusion  06/2013  . TUBAL LIGATION      Current Medications: Current Meds  Medication Sig  . alendronate (FOSAMAX) 70 MG tablet Take 70 mg by mouth every Thursday. Take with a full glass of water on an empty stomach.  Marland Kitchen allopurinol (ZYLOPRIM) 100 MG tablet Take 100 mg by mouth daily.  Marland Kitchen amLODipine (NORVASC) 10 MG tablet Take 10 mg by mouth daily.  Marland Kitchen ascorbic acid (VITAMIN C) 500 MG tablet Take 1,000 mg by mouth daily.  Marland Kitchen aspirin EC 81 MG tablet Take 81 mg by mouth daily.  . benzonatate (TESSALON) 100 MG capsule Take 100-200 mg by mouth every 8 (eight) hours as needed.  . Biotin 10 MG TABS Take 10 mg by mouth daily.  . calcium carbonate (OSCAL) 1500 (600 Ca) MG TABS tablet Take 600 mg of elemental calcium by mouth 3 (three) times daily with meals.  . Cholecalciferol (VITAMIN D3) 2000  units TABS Take 1 tablet by mouth daily.  . cloNIDine (CATAPRES) 0.1 MG tablet Take 0.1 mg by mouth at bedtime.  . cycloSPORINE (RESTASIS) 0.05 % ophthalmic emulsion Place 1 drop into both eyes 2 (two) times daily.   . hydrochlorothiazide (HYDRODIURIL) 25 MG tablet Take 25 mg by mouth daily.  Marland Kitchen HYDROmorphone (DILAUDID) 2 MG tablet Take 2 mg by mouth in the morning, at noon, and at bedtime.   Marland Kitchen HYDROmorphone HCl (EXALGO) 8 MG TB24 Take 8 mg by mouth at bedtime.  . Lactobacillus (PROBIOTIC ACIDOPHILUS PO) Take 1 tablet by mouth daily.  . lansoprazole (PREVACID) 30 MG capsule Take 1 capsule (30 mg total) by mouth daily before breakfast. 30 minutes before breakfast  . latanoprost (XALATAN) 0.005 % ophthalmic solution Place 1 drop into both eyes at bedtime.  Marland Kitchen linaclotide (LINZESS) 290 MCG CAPS capsule Take 1 capsule (290 mcg total) by mouth daily before breakfast.  . LIVALO 2 MG TABS Take 1 tablet by mouth at bedtime.  . Magnesium (V-R MAGNESIUM) 250 MG TABS Take 250 mg by mouth daily.  . methocarbamol (ROBAXIN) 750 MG tablet Take 750 mg by mouth 3 (three) times daily.  . nadolol (CORGARD) 40 MG tablet Take 60 mg by mouth daily.  Marland Kitchen NARCAN 4 MG/0.1ML LIQD nasal spray kit Place 4 mg into the nose. I spray in both nares PRN  . nitroGLYCERIN (NITROSTAT) 0.4 MG SL tablet Place 1 tablet (0.4 mg total) under the tongue every 5 (five) minutes as needed for chest pain.  . Omega-3 Fatty Acids (FISH OIL) 600 MG CAPS Take 600 mg by mouth 2 (two) times daily.  Marland Kitchen oxybutynin (DITROPAN) 5 MG tablet Take 5 mg by mouth 2 (two) times daily.  . polyethylene glycol (MIRALAX / GLYCOLAX) 17 g packet Take 17 g by mouth daily as needed for mild constipation.  Marland Kitchen spironolactone (ALDACTONE) 25 MG tablet Take 25 mg by mouth daily.  . timolol (BETIMOL) 0.5 % ophthalmic solution Place 1 drop into both eyes daily. In the morning  . traZODone (DESYREL) 100 MG tablet Take 200 mg by mouth at bedtime.   . trazodone (DESYREL) 300 MG  tablet Take 300 mg by mouth at bedtime.  Marland Kitchen trimethoprim (TRIMPEX) 100 MG tablet Take 100 mg by mouth daily.  . valsartan (DIOVAN) 80 MG tablet Take 80 mg by mouth daily.  . Wheat Dextrin (BENEFIBER) POWD Take 1 packet by mouth daily. Clear and Sugar FREE. 1 tablespoon daily  Allergies:   Ciprofloxacin, Cyclobenzaprine, Pregabalin, Fluvastatin, Meloxicam, Metaxalone, Pramipexole, Pravastatin, Rosuvastatin, Sertraline, Statins, Other, Pravachol [pravastatin sodium], Amitriptyline, Arthrotec [diclofenac-misoprostol], Augmentin [amoxicillin-pot clavulanate], Bromfenac, Cephalexin, Conj estrog-medroxyprogest ace, Diclofenac-misoprostol, Erythromycin, Esomeprazole, Esomeprazole magnesium, Levofloxacin, Methadone, Metoclopramide, Oxycodone, Propoxyphene, Rofecoxib, Sulfur, Tramadol, and Zoloft [sertraline hcl]   Social History   Socioeconomic History  . Marital status: Widowed    Spouse name: Not on file  . Number of children: 2  . Years of education: Not on file  . Highest education level: Not on file  Occupational History  . Occupation: Retired  Tobacco Use  . Smoking status: Never Smoker  . Smokeless tobacco: Never Used  Substance and Sexual Activity  . Alcohol use: No  . Drug use: No  . Sexual activity: Not on file  Other Topics Concern  . Not on file  Social History Narrative  . Not on file   Social Determinants of Health   Financial Resource Strain:   . Difficulty of Paying Living Expenses:   Food Insecurity:   . Worried About Charity fundraiser in the Last Year:   . Arboriculturist in the Last Year:   Transportation Needs:   . Film/video editor (Medical):   Marland Kitchen Lack of Transportation (Non-Medical):   Physical Activity:   . Days of Exercise per Week:   . Minutes of Exercise per Session:   Stress:   . Feeling of Stress :   Social Connections:   . Frequency of Communication with Friends and Family:   . Frequency of Social Gatherings with Friends and Family:   .  Attends Religious Services:   . Active Member of Clubs or Organizations:   . Attends Archivist Meetings:   Marland Kitchen Marital Status:      Family History: The patient's family history includes Diabetes in her mother; Prostate cancer in her father; Stroke in her sister. There is no history of Colon cancer or Esophageal cancer.  ROS:   Review of Systems  Constitution: Negative for decreased appetite, fever and weight gain.  HENT: Negative for congestion, ear discharge, hoarse voice and sore throat.   Eyes: Negative for discharge, redness, vision loss in right eye and visual halos.  Cardiovascular: Negative for chest pain, dyspnea on exertion, leg swelling, orthopnea and palpitations.  Respiratory: Negative for cough, hemoptysis, shortness of breath and snoring.   Endocrine: Negative for heat intolerance and polyphagia.  Hematologic/Lymphatic: Negative for bleeding problem. Does not bruise/bleed easily.  Skin: Negative for flushing, nail changes, rash and suspicious lesions.  Musculoskeletal: Negative for arthritis, joint pain, muscle cramps, myalgias, neck pain and stiffness.  Gastrointestinal: Negative for abdominal pain, bowel incontinence, diarrhea and excessive appetite.  Genitourinary: Negative for decreased libido, genital sores and incomplete emptying.  Neurological: Negative for brief paralysis, focal weakness, headaches and loss of balance.  Psychiatric/Behavioral: Negative for altered mental status, depression and suicidal ideas.  Allergic/Immunologic: Negative for HIV exposure and persistent infections.    EKGs/Labs/Other Studies Reviewed:    The following studies were reviewed today:   EKG:  None today  Coronary CTA IMPRESSION: 1. Normal aortic root 3.2 cm with moderate calcific atherosclerosis  2. Calcium score 1213 involving LM and 3 vessels this is 95 th percentile for age and sex  97. CAD see description above study sent for FFR CT to r/o obstructive disease  in mid RCA and proximal LAD  Recent Labs: 01/15/2019: B Natriuretic Peptide 92.4 01/17/2019: ALT 44; BUN 9; Creatinine, Ser 0.74; Hemoglobin 11.9; Magnesium 2.2;  Platelets 245; Potassium 3.9; Sodium 137  Recent Lipid Panel No results found for: CHOL, TRIG, HDL, CHOLHDL, VLDL, LDLCALC, LDLDIRECT  Physical Exam:    VS:  BP 120/60 (BP Location: Right Arm, Patient Position: Sitting, Cuff Size: Large)   Pulse 67   Ht 5' 4"  (1.626 m)   Wt 207 lb (93.9 kg)   SpO2 97%   BMI 35.53 kg/m     Wt Readings from Last 3 Encounters:  07/19/19 207 lb (93.9 kg)  04/19/19 205 lb (93 kg)  03/01/19 208 lb 6.4 oz (94.5 kg)     GEN: Well nourished, well developed in no acute distress HEENT: Normal NECK: No JVD; No carotid bruits LYMPHATICS: No lymphadenopathy CARDIAC: S1S2 noted,RRR, no murmurs, rubs, gallops RESPIRATORY:  Clear to auscultation without rales, wheezing or rhonchi  ABDOMEN: Soft, non-tender, non-distended, +bowel sounds, no guarding. EXTREMITIES: No edema, No cyanosis, no clubbing MUSCULOSKELETAL:  No deformity  SKIN: Warm and dry NEUROLOGIC:  Alert and oriented x 3, non-focal PSYCHIATRIC:  Normal affect, good insight  ASSESSMENT:    1. Essential hypertension   2. Coronary artery disease involving native coronary artery of native heart without angina pectoris   3. Obesity (BMI 30-39.9)   4. Mixed hyperlipidemia    PLAN:     1.  The patient appears to be doing well from a cardiovascular standpoint.  She had questions about mitral regurgitation patient was able to answer to her satisfaction.  2.  Coronary disease no angina symptoms.  Continue on current medication regimen.  3.  Hypertension-blood pressure is acceptable in the office today we will continue her current medication regimen.  4.  The patient understands the need to lose weight with diet and exercise. We have discussed specific strategies for this.  The patient is in agreement with the above plan. The patient left  the office in stable condition.  The patient will follow up in sooner if needed.   Medication Adjustments/Labs and Tests Ordered: Current medicines are reviewed at length with the patient today.  Concerns regarding medicines are outlined above.  No orders of the defined types were placed in this encounter.  No orders of the defined types were placed in this encounter.   Patient Instructions  Medication Instructions:  Your physician recommends that you continue on your current medications as directed. Please refer to the Current Medication list given to you today.  *If you need a refill on your cardiac medications before your next appointment, please call your pharmacy*   Lab Work: None If you have labs (blood work) drawn today and your tests are completely normal, you will receive your results only by: Marland Kitchen MyChart Message (if you have MyChart) OR . A paper copy in the mail If you have any lab test that is abnormal or we need to change your treatment, we will call you to review the results.   Testing/Procedures: None   Follow-Up: At [facility], you and your health needs are our priority.  As part of our continuing mission to provide you with exceptional heart care, we have created designated Provider Care Teams.  These Care Teams include your primary Cardiologist (physician) and Advanced Practice Providers (APPs -  Physician Assistants and Nurse Practitioners) who all work together to provide you with the care you need, when you need it.  We recommend signing up for the patient portal called "MyChart".  Sign up information is provided on this After Visit Summary.  MyChart is used to connect with patients  for Virtual Visits (Telemedicine).  Patients are able to view lab/test results, encounter notes, upcoming appointments, etc.  Non-urgent messages can be sent to your provider as well.   To learn more about what you can do with MyChart, go to [email or web address].    Your next  appointment:   6 month(s)  The format for your next appointment:   In Person  Provider:   Berniece Salines, DO   Other Instructions      Adopting a Healthy Lifestyle.  Know what a healthy weight is for you (roughly BMI <25) and aim to maintain this   Aim for 7+ servings of fruits and vegetables daily   65-80+ fluid ounces of water or unsweet tea for healthy kidneys   Limit to max 1 drink of alcohol per day; avoid smoking/tobacco   Limit animal fats in diet for cholesterol and heart health - choose grass fed whenever available   Avoid highly processed foods, and foods high in saturated/trans fats   Aim for low stress - take time to unwind and care for your mental health   Aim for 150 min of moderate intensity exercise weekly for heart health, and weights twice weekly for bone health   Aim for 7-9 hours of sleep daily   When it comes to diets, agreement about the perfect plan isnt easy to find, even among the experts. Experts at the [employer] developed an idea known as the Healthy Eating Plate. Just imagine a plate divided into logical, healthy portions.   The emphasis is on diet quality:   Load up on vegetables and fruits - one-half of your plate: Aim for color and variety, and remember that potatoes dont count.   Go for whole grains - one-quarter of your plate: Whole wheat, barley, wheat berries, quinoa, oats, brown rice, and foods made with them. If you want pasta, go with whole wheat pasta.   Protein power - one-quarter of your plate: Fish, chicken, beans, and nuts are all healthy, versatile protein sources. Limit red meat.   The diet, however, does go beyond the plate, offering a few other suggestions.   Use healthy plant oils, such as olive, canola, soy, corn, sunflower and peanut. Check the labels, and avoid partially hydrogenated oil, which have unhealthy trans fats.   If youre thirsty, drink water. Coffee and tea are good in moderation, but  skip sugary drinks and limit milk and dairy products to one or two daily servings.   The type of carbohydrate in the diet is more important than the amount. Some sources of carbohydrates, such as vegetables, fruits, whole grains, and beans-are healthier than others.   Finally, stay active  Signed, Berniece Salines, DO  07/19/2019 2:21 PM    [facility]

## 2019-07-21 ENCOUNTER — Encounter (HOSPITAL_COMMUNITY): Payer: Self-pay | Admitting: Emergency Medicine

## 2019-07-21 ENCOUNTER — Other Ambulatory Visit: Payer: Self-pay

## 2019-07-21 ENCOUNTER — Emergency Department (HOSPITAL_COMMUNITY): Payer: PPO

## 2019-07-21 ENCOUNTER — Emergency Department (HOSPITAL_COMMUNITY)
Admission: EM | Admit: 2019-07-21 | Discharge: 2019-07-21 | Disposition: A | Discharge status: Home / Self Care | Payer: PPO | Attending: Emergency Medicine | Admitting: Emergency Medicine

## 2019-07-21 DIAGNOSIS — R1084 Generalized abdominal pain: Secondary | ICD-10-CM | POA: Diagnosis not present

## 2019-07-21 DIAGNOSIS — R1110 Vomiting, unspecified: Secondary | ICD-10-CM | POA: Diagnosis not present

## 2019-07-21 DIAGNOSIS — Z79899 Other long term (current) drug therapy: Secondary | ICD-10-CM | POA: Diagnosis not present

## 2019-07-21 DIAGNOSIS — R52 Pain, unspecified: Secondary | ICD-10-CM | POA: Diagnosis not present

## 2019-07-21 DIAGNOSIS — R1013 Epigastric pain: Secondary | ICD-10-CM | POA: Diagnosis not present

## 2019-07-21 DIAGNOSIS — R112 Nausea with vomiting, unspecified: Secondary | ICD-10-CM | POA: Insufficient documentation

## 2019-07-21 DIAGNOSIS — I10 Essential (primary) hypertension: Secondary | ICD-10-CM | POA: Diagnosis not present

## 2019-07-21 DIAGNOSIS — R1010 Upper abdominal pain, unspecified: Secondary | ICD-10-CM | POA: Diagnosis not present

## 2019-07-21 DIAGNOSIS — Z85828 Personal history of other malignant neoplasm of skin: Secondary | ICD-10-CM | POA: Diagnosis not present

## 2019-07-21 LAB — COMPREHENSIVE METABOLIC PANEL
ALT: 15 U/L (ref 0–44)
AST: 18 U/L (ref 15–41)
Albumin: 3.3 g/dL — ABNORMAL LOW (ref 3.5–5.0)
Alkaline Phosphatase: 66 U/L (ref 38–126)
Anion gap: 13 (ref 5–15)
BUN: 10 mg/dL (ref 8–23)
CO2: 25 mmol/L (ref 22–32)
Calcium: 9.6 mg/dL (ref 8.9–10.3)
Chloride: 95 mmol/L — ABNORMAL LOW (ref 98–111)
Creatinine, Ser: 0.81 mg/dL (ref 0.44–1.00)
GFR calc Af Amer: >60 mL/min (ref >60)
GFR calc non Af Amer: >60 mL/min (ref >60)
Glucose, Bld: 111 mg/dL — ABNORMAL HIGH (ref 70–99)
Potassium: 3.7 mmol/L (ref 3.5–5.1)
Sodium: 133 mmol/L — ABNORMAL LOW (ref 135–145)
Total Bilirubin: 0.8 mg/dL (ref 0.3–1.2)
Total Protein: 7 g/dL (ref 6.5–8.1)

## 2019-07-21 LAB — CBC WITH DIFFERENTIAL/PLATELET
Abs Immature Granulocytes: 0.02 10*3/uL (ref 0.00–0.07)
Basophils Absolute: 0 10*3/uL (ref 0.0–0.1)
Basophils Relative: 1 %
Eosinophils Absolute: 0.2 10*3/uL (ref 0.0–0.5)
Eosinophils Relative: 2 %
HCT: 37.1 % (ref 36.0–46.0)
Hemoglobin: 12 g/dL (ref 12.0–15.0)
Immature Granulocytes: 0 %
Lymphocytes Relative: 27 %
Lymphs Abs: 1.7 10*3/uL (ref 0.7–4.0)
MCH: 29.3 pg (ref 26.0–34.0)
MCHC: 32.3 g/dL (ref 30.0–36.0)
MCV: 90.5 fL (ref 80.0–100.0)
Monocytes Absolute: 0.6 10*3/uL (ref 0.1–1.0)
Monocytes Relative: 9 %
Neutro Abs: 4 10*3/uL (ref 1.7–7.7)
Neutrophils Relative %: 61 %
Platelets: 274 10*3/uL (ref 150–400)
RBC: 4.1 MIL/uL (ref 3.87–5.11)
RDW: 12.5 % (ref 11.5–15.5)
WBC: 6.6 10*3/uL (ref 4.0–10.5)
nRBC: 0 % (ref 0.0–0.2)

## 2019-07-21 LAB — URINALYSIS, ROUTINE W REFLEX MICROSCOPIC
Bacteria, UA: NONE SEEN
Bilirubin Urine: NEGATIVE
Glucose, UA: NEGATIVE mg/dL
Hgb urine dipstick: NEGATIVE
Ketones, ur: NEGATIVE mg/dL
Leukocytes,Ua: NEGATIVE
Nitrite: NEGATIVE
Protein, ur: 30 mg/dL — AB
Specific Gravity, Urine: 1.01 (ref 1.005–1.030)
pH: 8 (ref 5.0–8.0)

## 2019-07-21 LAB — LIPASE, BLOOD: Lipase: 33 U/L (ref 11–51)

## 2019-07-21 MED ORDER — SODIUM CHLORIDE 0.9 % IV BOLUS
500.0000 mL | Freq: Once | INTRAVENOUS | Status: AC
  Administered 2019-07-21: 500 mL via INTRAVENOUS

## 2019-07-21 MED ORDER — SUCRALFATE 1 GM/10ML PO SUSP: 1.0000 g | Freq: Three times a day (TID) | ORAL | 0 refills | Status: DC

## 2019-07-21 MED ORDER — HYDROMORPHONE HCL 1 MG/ML IJ SOLN
0.5000 mg | Freq: Once | INTRAMUSCULAR | Status: AC
  Administered 2019-07-21: 0.5 mg via INTRAVENOUS
  Filled 2019-07-21: qty 1

## 2019-07-21 MED ORDER — FENTANYL CITRATE (PF) 100 MCG/2ML IJ SOLN
50.0000 ug | Freq: Once | INTRAMUSCULAR | Status: AC
  Administered 2019-07-21: 50 ug via INTRAVENOUS
  Filled 2019-07-21: qty 2

## 2019-07-21 MED ORDER — IOHEXOL 300 MG/ML  SOLN
100.0000 mL | Freq: Once | INTRAMUSCULAR | Status: AC | PRN
  Administered 2019-07-21: 100 mL via INTRAVENOUS

## 2019-07-21 MED ORDER — ONDANSETRON HCL 4 MG/2ML IJ SOLN
4.0000 mg | Freq: Once | INTRAMUSCULAR | Status: AC
  Administered 2019-07-21: 4 mg via INTRAVENOUS
  Filled 2019-07-21: qty 2

## 2019-07-21 NOTE — ED Triage Notes (Signed)
Pt arrives via [facility] from home with complaints of nausea X2 weeks off and on. Pt began to have abdominal pain and vomiting yesterday. Upper gastric pain tender to touch. 10/10 pain  4mg  zofran given en route 156/98 70 HR 98% RA 98.0

## 2019-07-21 NOTE — ED Provider Notes (Signed)
[facility] EMERGENCY DEPARTMENT Provider Note   CSN: [id number] Arrival date & time: 07/21/19  3785     History Chief Complaint  Patient presents with  . Abdominal Pain    Karen Castillo is a 79 y.o. female.  HPI Patient presents with upper abdominal pain.  Has had on and off for the last 2 weeks.  States becoming more severe.  States over the last couple days much more tender.  Has had previous cholecystectomy.  No diarrhea.  No constipation.  No fevers.  Somewhat decreased appetite.  States has not been able to eat today due to the pain and vomiting.    Past Medical History:  Diagnosis Date  . Abdominal pain   . Acute post-operative pain 07/13/2017  . Allergic rhinitis   . Anemia   . Arthritis   . Bipolar disorder ([place])    patient denies  . Cancer (HCC)    SKIN    ,    LEFT BREAST   . Chronic bilateral low back pain 09/16/2016  . Chronic constipation   . Chronic pain syndrome   . Constipation   . 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 Dry eyes   . Dysuria   . Elevated cholesterol   . 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 Gallstones   . GERD (gastroesophageal reflux disease)   . Glaucoma    both eyes  . Gout   . Hypertension   . IBS (irritable bowel syndrome)   . Idiopathic peripheral neuropathy 06/11/2015  . 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 Ingrown nail of great toe of right foot 05/04/2017  . Insomnia   . Intractable low back pain 07/13/2017  . Low back pain 08/27/2017  . Nausea without vomiting   . Obesity   . Pain syndrome, chronic 11/29/2015  . Paronychia of great toe, right 05/04/2017  . Postoperative anemia due to acute blood loss 08/29/2017  . 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 Radiculopathy 05/04/2014  . Radiculopathy of lumbar region 11/29/2015  . Risk for falls 06/11/2015  . Schizophrenia ([place])   . Sleep apnea    USES CPAP   . Sleep apnea, obstructive 04/03/2015  . Urticaria   . Vitamin deficiency     Patient  Active Problem List   Diagnosis Date Noted  . Chest pain 01/16/2019  . Hypertension 01/16/2019  . 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 Abdominal pain   . Nausea without vomiting   . Constipation   . Postoperative anemia due to acute blood loss 08/29/2017  . Low back pain 08/27/2017  . Urticaria 07/19/2017  . Acute post-operative pain 07/13/2017  . Intractable low back pain 07/13/2017  . Ingrown nail of great toe of right foot 05/04/2017  . Paronychia of great toe, right 05/04/2017  . Chronic bilateral low back pain 09/16/2016  . Radiculopathy of lumbar region 11/29/2015  . 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 Pain syndrome, chronic 11/29/2015  . Idiopathic peripheral neuropathy 06/11/2015  . Risk for falls 06/11/2015  . 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 Sleep apnea, obstructive 04/03/2015  . Radiculopathy 05/04/2014    Past Surgical History:  Procedure Laterality Date  . ABDOMINAL EXPOSURE N/A 05/04/2014   Procedure: ABDOMINAL EXPOSURE;  Surgeon: Serafina Mitchell, MD;  Location: [place];  Service: Vascular;  Laterality: N/A;  . ANTERIOR LUMBAR FUSION N/A 05/04/2014   Procedure: ANTERIOR LUMBAR FUSION 1 LEVEL;  Surgeon: Elta Guadeloupe  Suzan Slick, MD;  Location: [place];  Service: Orthopedics;  Laterality: N/A;  Lumbar 5-sacrum 1 anterior lumbar interbody fusion with instrumentation and allograft  . ANTERIOR LUMBAR FUSION  07/2017  . ANTERIOR LUMBAR FUSION  08/2017  . BACK SURGERY     2010 ,2015LUMB FUSION, 06/2013 [facility] FUSION  . BACK SURGERY  07/2017   Lumbar  . BREAST BIOPSY  2013   pt said it was positive   . BREAST SURGERY     LEFT BRREAST BX  . CARPAL TUNNEL RELEASE     1994 RT  . CARPAL TUNNEL RELEASE Bilateral   . CATARACT EXTRACTION, BILATERAL  2017  . CHOLECYSTECTOMY     2004  . COLONOSCOPY W/ BIOPSIES     Colonoscopy 3810,1751, 2009, 2011  . ERCP     2009  . ESOPHAGOGASTRODUODENOSCOPY  08/09/2017   Small  hiatal hernia. Mild gastritis. Incidenta; duodenal diverticulum. Incidental gastric polyp.   [facility] LEEP     1992 and 1994  . SKIN CANCER EXCISION     X 4   . thorocolumbar fusion  06/2013  . TUBAL LIGATION       OB History   No obstetric history on file.     Family History  Problem Relation Age of Onset  . Diabetes Mother        had a history but not anymore  . Prostate cancer Father   . Stroke Sister        has a brain tumor  . Colon cancer Neg Hx   . Esophageal cancer Neg Hx     Social History   Tobacco Use  . Smoking status: Never Smoker  . Smokeless tobacco: Never Used  Substance Use Topics  . Alcohol use: No  . Drug use: No    Home Medications Prior to Admission medications   Medication Sig Start Date End Date Taking? Authorizing Provider  alendronate (FOSAMAX) 70 MG tablet Take 70 mg by mouth every Thursday. Take with a full glass of water on an empty stomach.    [provider]  allopurinol (ZYLOPRIM) 100 MG tablet Take 100 mg by mouth daily.    [provider]  amLODipine (NORVASC) 10 MG tablet Take 10 mg by mouth daily. 05/04/19   [provider]  ascorbic acid (VITAMIN C) 500 MG tablet Take 1,000 mg by mouth daily.    [provider]  aspirin EC 81 MG tablet Take 81 mg by mouth daily.    [provider]  benzonatate (TESSALON) 100 MG capsule Take 100-200 mg by mouth every 8 (eight) hours as needed. 07/13/19   [provider]  Biotin 10 MG TABS Take 10 mg by mouth daily.    [provider]  calcium carbonate (OSCAL) 1500 (600 Ca) MG TABS tablet Take 600 mg of elemental calcium by mouth 3 (three) times daily with meals.    [provider]  Cholecalciferol (VITAMIN D3) 2000 units TABS Take 1 tablet by mouth daily.    [provider]  cloNIDine (CATAPRES) 0.1 MG tablet Take 0.1 mg by mouth at bedtime.    [provider]  cycloSPORINE (RESTASIS) 0.05 % ophthalmic emulsion Place 1  drop into both eyes 2 (two) times daily.     [provider]  hydrochlorothiazide (HYDRODIURIL) 25 MG tablet Take 25 mg by mouth daily. 01/14/19   [provider]  HYDROmorphone (DILAUDID) 2 MG tablet Take 2 mg by mouth in the morning, at noon, and at  bedtime.     [provider]  HYDROmorphone HCl (EXALGO) 8 MG TB24 Take 8 mg by mouth at bedtime.    [provider]  Lactobacillus (PROBIOTIC ACIDOPHILUS PO) Take 1 tablet by mouth daily.    [provider]  lansoprazole (PREVACID) 30 MG capsule Take 1 capsule (30 mg total) by mouth daily before breakfast. 30 minutes before breakfast 01/17/19   Aline August, MD  latanoprost (XALATAN) 0.005 % ophthalmic solution Place 1 drop into both eyes at bedtime.    [provider]  linaclotide Rolan Lipa) 290 MCG CAPS capsule Take 1 capsule (290 mcg total) by mouth daily before breakfast. 11/27/17   Jackquline Denmark, MD  LIVALO 2 MG TABS Take 1 tablet by mouth at bedtime. 12/27/18   [provider]  Magnesium (V-R MAGNESIUM) 250 MG TABS Take 250 mg by mouth daily.    [provider]  methocarbamol (ROBAXIN) 750 MG tablet Take 750 mg by mouth 3 (three) times daily.    [provider]  nadolol (CORGARD) 40 MG tablet Take 60 mg by mouth daily.    [provider]  NARCAN 4 MG/0.1ML LIQD nasal spray kit Place 4 mg into the nose. I spray in both nares PRN 07/08/19   [provider]  nitroGLYCERIN (NITROSTAT) 0.4 MG SL tablet Place 1 tablet (0.4 mg total) under the tongue every 5 (five) minutes as needed for chest pain. 01/17/19   Aline August, MD  Omega-3 Fatty Acids (FISH OIL) 600 MG CAPS Take 600 mg by mouth 2 (two) times daily.    [provider]  oxybutynin (DITROPAN) 5 MG tablet Take 5 mg by mouth 2 (two) times daily.    [provider]  polyethylene glycol (MIRALAX / GLYCOLAX) 17 g packet Take 17 g by mouth daily as needed for mild constipation. 01/17/19    Aline August, MD  spironolactone (ALDACTONE) 25 MG tablet Take 25 mg by mouth daily.    [provider]  sucralfate (CARAFATE) 1 GM/10ML suspension Take 10 mLs (1 g total) by mouth 4 (four) times daily -  with meals and at bedtime. 07/21/19   Davonna Belling, MD  timolol (BETIMOL) 0.5 % ophthalmic solution Place 1 drop into both eyes daily. In the morning    [provider]  traZODone (DESYREL) 100 MG tablet Take 200 mg by mouth at bedtime.     [provider]  trazodone (DESYREL) 300 MG tablet Take 300 mg by mouth at bedtime. 07/10/19   [provider]  trimethoprim (TRIMPEX) 100 MG tablet Take 100 mg by mouth daily.    [provider]  valsartan (DIOVAN) 80 MG tablet Take 80 mg by mouth daily.    [provider]  Wheat Dextrin (BENEFIBER) POWD Take 1 packet by mouth daily. Clear and Sugar FREE. 1 tablespoon daily    [provider]    Allergies    Ciprofloxacin, Cyclobenzaprine, Pregabalin, Fluvastatin, Meloxicam, Metaxalone, Pramipexole, Pravastatin, Rosuvastatin, Sertraline, Statins, Other, Pravachol [pravastatin sodium], Amitriptyline, Arthrotec [diclofenac-misoprostol], Augmentin [amoxicillin-pot clavulanate], Bromfenac, Cephalexin, Conj estrog-medroxyprogest ace, Diclofenac-misoprostol, Erythromycin, Esomeprazole, Esomeprazole magnesium, Levofloxacin, Methadone, Metoclopramide, Oxycodone, Propoxyphene, Rofecoxib, Sulfur, Tramadol, and Zoloft [sertraline hcl]  Review of Systems   Review of Systems  Constitutional: Positive for appetite change.  HENT: Negative for congestion.   Respiratory: Negative for shortness of breath.   Cardiovascular: Negative for chest pain.  Gastrointestinal: Positive for abdominal pain, nausea and vomiting.  Genitourinary: Negative for flank pain.  Musculoskeletal: Negative for back pain.  Neurological: Negative for weakness.  Psychiatric/Behavioral: Negative for confusion.    Physical  Exam Updated Vital Signs BP (!) 141/69 (BP Location: Left Arm)   Pulse 69   Temp 98.4 F (36.9 C) (Oral)   Resp (!) 25   Ht 5' 4"  (1.626 m)   Wt 92.5 kg   SpO2 96%   BMI 35.02 kg/m   Physical Exam Vitals and nursing note reviewed.  HENT:     Head: Atraumatic.  Eyes:     Extraocular Movements: Extraocular movements intact.  Cardiovascular:     Rate and Rhythm: Normal rate and regular rhythm.  Pulmonary:     Breath sounds: Normal breath sounds.  Abdominal:     Tenderness: There is abdominal tenderness.     Comments: Epigastric right upper quadrant tenderness without rebound or guarding.  No hernia.  Skin:    General: Skin is warm.  Neurological:     Mental Status: She is alert and oriented to person, place, and time.     ED Results / Procedures / Treatments   Labs (all labs ordered are listed, but only abnormal results are displayed) Labs Reviewed  COMPREHENSIVE METABOLIC PANEL - Abnormal; Notable for the following components:      Result Value   Sodium 133 (*)    Chloride 95 (*)    Glucose, Bld 111 (*)    Albumin 3.3 (*)    All other components within normal limits  URINALYSIS, ROUTINE W REFLEX MICROSCOPIC - Abnormal; Notable for the following components:   Protein, ur 30 (*)    All other components within normal limits  CBC WITH DIFFERENTIAL/PLATELET  LIPASE, BLOOD    EKG EKG Interpretation  Date/Time:  Thursday Jul 21 2019 08:09:26 EDT Ventricular Rate:  62 PR Interval:    QRS Duration: 133 QT Interval:  443 QTC Calculation: 450 R Axis:   67 Text Interpretation: Sinus rhythm Nonspecific intraventricular conduction delay Confirmed by Davonna Belling [phone]) on 07/21/2019 10:41:40 AM   Radiology CT ABDOMEN PELVIS W CONTRAST  Result Date: 07/21/2019 CLINICAL DATA:  Upper abdominal pain, nausea and vomiting for 1 week. EXAM: CT ABDOMEN AND PELVIS WITH CONTRAST TECHNIQUE: Multidetector CT imaging of the abdomen and pelvis was performed using the  standard protocol following bolus administration of intravenous contrast. CONTRAST:  100 mL OMNIPAQUE IOHEXOL 300 MG/ML  SOLN COMPARISON:  CT angiogram of the chest, abdomen and pelvis 01/12/2019. FINDINGS: Lower chest: Lung bases clear.  No pleural or pericardial effusion. Hepatobiliary: No focal liver abnormality is seen. Status post cholecystectomy. No biliary dilatation. Pancreas: Unremarkable. No pancreatic ductal dilatation or surrounding inflammatory changes. Spleen: Normal in size without focal abnormality. Adrenals/Urinary Tract: Adrenal glands are unremarkable. Kidneys are normal, without renal calculi, focal lesion, or hydronephrosis. Tiny left renal cyst incidentally noted. Bladder is unremarkable. Stomach/Bowel: Stomach is within normal limits. Small hiatal hernia noted. Appendix appears normal. No evidence of bowel wall thickening, distention, or inflammatory changes. Vascular/Lymphatic: Aortic atherosclerosis. No enlarged abdominal or pelvic lymph nodes. Reproductive: Uterus and bilateral adnexa are unremarkable. Other: None. Musculoskeletal: No acute abnormality. Remote and mild T12, L1 and L2 compression fractures are unchanged. Convex left scoliosis and thoracolumbar fusion hardware again seen. IMPRESSION: No acute abnormality abdomen or pelvis. No finding to explain the patient's symptoms. Atherosclerosis. Small hiatal hernia. Electronically Signed   By: Inge Rise M.D.   On: 07/21/2019 11:25    Procedures Procedures (including critical care time)  Medications Ordered in ED Medications  ondansetron (ZOFRAN) injection 4 mg (4  mg Intravenous Given 07/21/19 0846)  fentaNYL (SUBLIMAZE) injection 50 mcg (50 mcg Intravenous Given 07/21/19 0847)  sodium chloride 0.9 % bolus 500 mL (0 mLs Intravenous Stopped 07/21/19 1038)  HYDROmorphone (DILAUDID) injection 0.5 mg (0.5 mg Intravenous Given 07/21/19 1111)  iohexol (OMNIPAQUE) 300 MG/ML solution 100 mL (100 mLs Intravenous Contrast Given  07/21/19 1057)    ED Course  I have reviewed the triage vital signs and the nursing notes.  Pertinent labs & imaging results that were available during my care of the patient were reviewed by me and considered in my medical decision making (see chart for details).    MDM Rules/Calculators/A&P                      Patient with epigastric abdominal pain.  Has had for the last 2 weeks.  Nausea and vomiting yesterday.  CT scan done and reassuring.  Blood work reassuring.  Continued pain.  Has had previous gastritis/esophagitis.  Potentially a cause of this.  Will add Carafate since patient is already on Prevacid.  Will have follow-up with Dr. Lyndel Safe, her gastroenterologist. Final Clinical Impression(s) / ED Diagnoses Final diagnoses:  Epigastric pain    Rx / DC Orders ED Discharge Orders         Ordered    sucralfate (CARAFATE) 1 GM/10ML suspension  3 times daily with meals & bedtime     07/21/19 1209           Davonna Belling, MD 07/21/19 1210

## 2019-08-05 DIAGNOSIS — R300 Dysuria: Secondary | ICD-10-CM | POA: Diagnosis not present

## 2019-08-05 DIAGNOSIS — E7800 Pure hypercholesterolemia, unspecified: Secondary | ICD-10-CM | POA: Diagnosis not present

## 2019-08-05 DIAGNOSIS — Z79899 Other long term (current) drug therapy: Secondary | ICD-10-CM | POA: Diagnosis not present

## 2019-08-05 DIAGNOSIS — M961 Postlaminectomy syndrome, not elsewhere classified: Secondary | ICD-10-CM | POA: Diagnosis not present

## 2019-08-05 DIAGNOSIS — M5136 Other intervertebral disc degeneration, lumbar region: Secondary | ICD-10-CM | POA: Diagnosis not present

## 2019-08-05 DIAGNOSIS — M129 Arthropathy, unspecified: Secondary | ICD-10-CM | POA: Diagnosis not present

## 2019-08-05 DIAGNOSIS — D539 Nutritional anemia, unspecified: Secondary | ICD-10-CM | POA: Diagnosis not present

## 2019-08-05 DIAGNOSIS — E559 Vitamin D deficiency, unspecified: Secondary | ICD-10-CM | POA: Diagnosis not present

## 2019-08-05 DIAGNOSIS — R5383 Other fatigue: Secondary | ICD-10-CM | POA: Diagnosis not present

## 2019-08-05 DIAGNOSIS — M545 Low back pain: Secondary | ICD-10-CM | POA: Diagnosis not present

## 2019-08-23 ENCOUNTER — Encounter: Payer: Self-pay | Admitting: Gastroenterology

## 2019-08-23 ENCOUNTER — Ambulatory Visit: Payer: PPO | Admitting: Gastroenterology

## 2019-08-23 VITALS — BP 120/74 | HR 75 | Temp 97.3°F | Ht 64.0 in | Wt 204.4 lb

## 2019-08-23 DIAGNOSIS — R109 Unspecified abdominal pain: Secondary | ICD-10-CM | POA: Diagnosis not present

## 2019-08-23 DIAGNOSIS — R0789 Other chest pain: Secondary | ICD-10-CM | POA: Diagnosis not present

## 2019-08-23 DIAGNOSIS — G8929 Other chronic pain: Secondary | ICD-10-CM | POA: Diagnosis not present

## 2019-08-23 DIAGNOSIS — R1310 Dysphagia, unspecified: Secondary | ICD-10-CM | POA: Diagnosis not present

## 2019-08-23 DIAGNOSIS — K219 Gastro-esophageal reflux disease without esophagitis: Secondary | ICD-10-CM

## 2019-08-23 NOTE — Progress Notes (Signed)
Chief Complaint: FU  Referring Provider:  Nicoletta Dress, MD      ASSESSMENT AND PLAN;   #1. GERD with small HH with Atypical chest pains. Occ dysphagia. Neg cardiac eval  #2.  Chronic abdominal pain, neg CTA 01/12/2019, neg CT scan 07/2019, 08/2017, 01/2019 except for constipation and rectal impaction s/p manual disimpaction.  #3. 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 Neg CTA 01/2019.  Plan: - Continue Pravacid 71m po BID - EGD with possible dil. - Continue benefiber, miralax 17g bid - Linzess 2922m po QD to continue. - Continue prunes. - She wants to hold off on colon at this time as she is doing well.  She does understand the risks and benefits.   HPI:    Karen Castillo a [age].o. female   [facility] CT AP 07/2019.   [facility].  Negative cardiac evaluation including cardiac CT, 2D echo. Also neg CTA chest Abdo/pelvis 01/12/2019.  Advised EGD.  Does feel better except for occasional heartburn with waterbrash.  Lately she has been having problems with food getting hung up in the mid chest-mostly solids.  She has been taking Prevacid 30 mg p.o. twice daily but could tell if she misses even a single dose.  Constipation is much better with Linzess, Benefiber and MiraLAX as needed.  Adamantly wants to hold off on colonoscopy since she had problems drinking preparation during the last colonoscopy.  She does understand that there is a small but definite chance of missing colorectal neoplasms.  No melena or hematochezia.  No weight loss.  Continues to be on narcotics for chronic back pain.  No significant nausea or vomiting.  No fever chills or night sweats.  No jaundice dark urine or pale stools.  Past GI procedures: -EGD 2004, 2007, 2009, 2011 (Dr DuEvette Cristal 2016 (Dr GuLyndel Safe 2018 (Dr BuMelina Copa small HH, mild gastritis, duodenal  diverticulum, small gastric polyps (hyperplastic/fundic gland) -[facility] 2004, 2007, 2009, [facility] Past Medical History:  Diagnosis Date  . Abdominal pain   . Acute post-operative pain 07/13/2017  . Allergic rhinitis   . Anemia   . Arthritis   . [place] disorder ([place]  patient denies  . Cancer (HCC)    SKIN    ,    LEFT BREAST   . Chronic bilateral low back pain 09/16/2016  . Chronic constipation   . Chronic pain syndrome   . Constipation   . 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 Dry eyes   . Dysuria   . Elevated cholesterol   . 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 Gallstones   . GERD (gastroesophageal reflux disease)   . Glaucoma    both eyes  . Gout   . Hypertension   . IBS (irritable bowel syndrome)   . Idiopathic peripheral neuropathy 06/11/2015  . [place] of [place] ([place] . Ingrown nail of great toe of right foot 05/04/2017  . Insomnia   . Intractable low back pain 07/13/2017  . Low back pain 08/27/2017  . Nausea without vomiting   . Obesity   . Pain syndrome, chronic 11/29/2015  . Paronychia of great toe, right 05/04/2017  . Postoperative anemia due to acute blood loss 08/29/2017  . 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 Radiculopathy 05/04/2014  .  Radiculopathy of lumbar region 11/29/2015  . Risk for falls 06/11/2015  . Schizophrenia ([place])   . Sleep apnea    USES CPAP   . Sleep apnea, obstructive 04/03/2015  . Urticaria   . Vitamin deficiency     Past Surgical History:  Procedure Laterality Date  . ABDOMINAL EXPOSURE N/A 05/04/2014   Procedure: ABDOMINAL EXPOSURE;  Surgeon: Serafina Mitchell, MD;  Location: [place];  Service: Vascular;  Laterality: N/A;  . ANTERIOR LUMBAR FUSION N/A 05/04/2014   Procedure: ANTERIOR LUMBAR FUSION 1 LEVEL;  Surgeon: Sinclair Ship, MD;  Location: [place];  Service: Orthopedics;  Laterality: N/A;  Lumbar 5-sacrum 1 anterior lumbar  interbody fusion with instrumentation and allograft  . ANTERIOR LUMBAR FUSION  07/2017  . ANTERIOR LUMBAR FUSION  08/2017  . BACK SURGERY     2010 ,2015LUMB FUSION, 06/2013 [facility] FUSION  . BACK SURGERY  07/2017   Lumbar  . BREAST BIOPSY  2013   pt said it was positive   . BREAST SURGERY     LEFT BRREAST BX  . CARPAL TUNNEL RELEASE     1994 RT  . CARPAL TUNNEL RELEASE Bilateral   . CATARACT EXTRACTION, BILATERAL  2017  . CHOLECYSTECTOMY     2004  . COLONOSCOPY W/ BIOPSIES     Colonoscopy 8563,[id number], 2009, 2011  . ERCP     2009  . ESOPHAGOGASTRODUODENOSCOPY  08/09/2017   Small hiatal hernia. Mild gastritis. Incidenta; duodenal diverticulum. Incidental gastric polyp.   Marland Kitchen LEEP     1992 and 1994  . SKIN CANCER EXCISION     X 4   . thorocolumbar fusion  06/2013  . TUBAL LIGATION      Family History  Problem Relation Age of Onset  . Diabetes Mother        had a history but not anymore  . Prostate cancer Father   . Stroke Sister        has a brain tumor  . Colon cancer Neg Hx   . Esophageal cancer Neg Hx     Social History   Tobacco Use  . Smoking status: Never Smoker  . Smokeless tobacco: Never Used  Vaping Use  . Vaping Use: Never used  Substance Use Topics  . Alcohol use: No  . Drug use: No    Current Outpatient Medications  Medication Sig Dispense Refill  . alendronate (FOSAMAX) 70 MG tablet Take 70 mg by mouth every Thursday. Take with a full glass of water on an empty stomach.    Marland Kitchen allopurinol (ZYLOPRIM) 100 MG tablet Take 100 mg by mouth daily.    Marland Kitchen amLODipine (NORVASC) 10 MG tablet Take 10 mg by mouth daily.    Marland Kitchen ascorbic acid (VITAMIN C) 500 MG tablet Take 1,000 mg by mouth daily.    Marland Kitchen aspirin EC 81 MG tablet Take 81 mg by mouth daily.    . benzonatate (TESSALON) 100 MG capsule Take 100-200 mg by mouth every 8 (eight) hours as needed.    . Biotin 10 MG TABS Take 10 mg by mouth daily.    . calcium carbonate (OSCAL) 1500 (600 Ca) MG TABS tablet Take 600 mg  of elemental calcium by mouth 3 (three) times daily with meals.    . Cholecalciferol (VITAMIN D3) 2000 units TABS Take 1 tablet by mouth daily.    . cloNIDine (CATAPRES) 0.1 MG tablet Take 0.1 mg by mouth at bedtime.    . cycloSPORINE (RESTASIS) 0.05 % ophthalmic emulsion  Place 1 drop into both eyes 2 (two) times daily.     . hydrochlorothiazide (HYDRODIURIL) 25 MG tablet Take 25 mg by mouth daily.    Marland Kitchen HYDROmorphone (DILAUDID) 2 MG tablet Take 2 mg by mouth in the morning, at noon, and at bedtime.     Marland Kitchen HYDROmorphone HCl (EXALGO) 8 MG TB24 Take 8 mg by mouth at bedtime.    . Lactobacillus (PROBIOTIC ACIDOPHILUS PO) Take 1 tablet by mouth daily.    . lansoprazole (PREVACID) 30 MG capsule Take 1 capsule (30 mg total) by mouth daily before breakfast. 30 minutes before breakfast 30 capsule 0  . latanoprost (XALATAN) 0.005 % ophthalmic solution Place 1 drop into both eyes at bedtime.    Marland Kitchen linaclotide (LINZESS) 290 MCG CAPS capsule Take 1 capsule (290 mcg total) by mouth daily before breakfast. 30 capsule 11  . LIVALO 2 MG TABS Take 1 tablet by mouth at bedtime.    . Magnesium (V-R MAGNESIUM) 250 MG TABS Take 250 mg by mouth daily.    . methocarbamol (ROBAXIN) 750 MG tablet Take 750 mg by mouth 3 (three) times daily.    . nadolol (CORGARD) 40 MG tablet Take 60 mg by mouth daily.    . nitroGLYCERIN (NITROSTAT) 0.4 MG SL tablet Place 1 tablet (0.4 mg total) under the tongue every 5 (five) minutes as needed for chest pain. 30 tablet 0  . Omega-3 Fatty Acids (FISH OIL) 600 MG CAPS Take 600 mg by mouth 2 (two) times daily.    Marland Kitchen oxybutynin (DITROPAN) 5 MG tablet Take 5 mg by mouth 2 (two) times daily.    . polyethylene glycol (MIRALAX / GLYCOLAX) 17 g packet Take 17 g by mouth daily as needed for mild constipation.    Marland Kitchen spironolactone (ALDACTONE) 25 MG tablet Take 25 mg by mouth daily.    . sucralfate (CARAFATE) 1 GM/10ML suspension Take 10 mLs (1 g total) by mouth 4 (four) times daily -  with meals and at  bedtime. 420 mL 0  . timolol (BETIMOL) 0.5 % ophthalmic solution Place 1 drop into both eyes daily. In the morning    . trazodone (DESYREL) 300 MG tablet Take 300 mg by mouth at bedtime.    Marland Kitchen trimethoprim (TRIMPEX) 100 MG tablet Take 100 mg by mouth daily.    . valsartan (DIOVAN) 80 MG tablet Take 80 mg by mouth daily.    . Wheat Dextrin (BENEFIBER) POWD Take 1 packet by mouth daily. Clear and Sugar FREE. 1 tablespoon daily    . NARCAN 4 MG/0.1ML LIQD nasal spray kit Place 4 mg into the nose. I spray in both nares PRN (Patient not taking: Reported on 08/23/2019)     No current facility-administered medications for this visit.    Allergies  Allergen Reactions  . Ciprofloxacin Swelling    Tongue swells  . Cyclobenzaprine Swelling    Feet swell   . Pregabalin Anaphylaxis, Shortness Of Breath, Swelling and Other (See Comments)    Dizziness also  . Fluvastatin Other (See Comments)    Leg pain   . Meloxicam Swelling    Feet became swollen  . Metaxalone Rash and Other (See Comments)    Flushing of the skin and sore throat, too  . Pramipexole Nausea And Vomiting  . Pravastatin Other (See Comments)    Leg pain  . Rosuvastatin Other (See Comments)    Leg pain  . Sertraline Other (See Comments)    Sensitive teeth  . Statins Other (See  Comments)    Leg pain     . Other     Darvocet  . Pravachol [Pravastatin Sodium]     Upper leg pain  . Amitriptyline Other (See Comments)    Weird feeling all over  . Arthrotec [Diclofenac-Misoprostol] Diarrhea  . Augmentin [Amoxicillin-Pot Clavulanate] Diarrhea    Has patient had a PCN reaction causing immediate rash, facial/tongue/throat swelling, SOB or lightheadedness with hypotension: No Has patient had a PCN reaction causing severe rash involving mucus membranes or skin necrosis: No Has patient had a PCN reaction that required hospitalization: No Has patient had a PCN reaction occurring within the last 10 years: No If all of the above answers  are "NO", then may proceed with Cephalosporin use.   . Bromfenac Diarrhea and Nausea Only  . Cephalexin Diarrhea and Nausea Only  . Conj Estrog-Medroxyprogest Ace Other (See Comments)    Unsure of reaction type Couldn't tolerate  . Diclofenac-Misoprostol Nausea Only and Diarrhea  . Erythromycin Diarrhea and Nausea Only  . Esomeprazole Diarrhea  . Esomeprazole Magnesium Nausea Only  . Levofloxacin Other (See Comments) and Nausea Only    Stomach upset  . Methadone Diarrhea  . Metoclopramide Diarrhea and Nausea Only  . Oxycodone Hives, Swelling and Rash  . Propoxyphene Diarrhea and Nausea Only  . Rofecoxib Diarrhea and Nausea Only  . Sulfur Diarrhea and Nausea Only  . Tramadol Diarrhea  . Zoloft [Sertraline Hcl] Other (See Comments)    Sensitivity with teeth    Review of Systems:  neg     Physical Exam:    BP 120/74   Pulse 75   Temp (!) 97.3 F (36.3 C)   Ht 5' 4"  (1.626 m)   Wt 204 lb 6 oz (92.7 kg)   BMI 35.08 kg/m  Filed Weights   08/23/19 1330  Weight: 204 lb 6 oz (92.7 kg)   Gen: awake, alert, NAD HEENT: anicteric, no pallor CV: RRR, no mrg Pulm: CTA b/l Abd: soft, NT/ND, +BS throughout Ext: no c/c/e Neuro: nonfocal  Data Reviewed: I have personally reviewed following labs and imaging studies  CBC: CBC Latest Ref Rng & Units 07/21/2019 01/17/2019 01/16/2019  WBC 4.0 - 10.5 K/uL 6.6 8.1 8.2  Hemoglobin 12.0 - 15.0 g/dL 12.0 11.9(L) 11.6(L)  Hematocrit 36 - 46 % 37.1 36.2 35.9(L)  Platelets 150 - 400 K/uL 274 245 265    CMP: CMP Latest Ref Rng & Units 07/21/2019 01/17/2019 01/16/2019  Glucose 70 - 99 mg/dL 111(H) 97 119(H)  BUN 8 - 23 mg/dL 10 9 12   Creatinine 0.44 - 1.00 mg/dL 0.81 0.74 0.95  Sodium 135 - 145 mmol/L 133(L) 137 138  Potassium 3.5 - 5.1 mmol/L 3.7 3.9 3.7  Chloride 98 - 111 mmol/L 95(L) 103 105  CO2 22 - 32 mmol/L 25 24 22   Calcium 8.9 - 10.3 mg/dL 9.6 8.7(L) 9.0  Total Protein 6.5 - 8.1 g/dL 7.0 6.5 -  Total Bilirubin 0.3 - 1.2 mg/dL  0.8 0.6 -  Alkaline Phos 38 - 126 U/L 66 82 -  AST 15 - 41 U/L 18 32 -  ALT 0 - 44 U/L 15 44 -        Carmell Austria, MD 08/23/2019, 1:50 PM  Cc: Nicoletta Dress, MD

## 2019-08-23 NOTE — Patient Instructions (Addendum)
If you are age 78 or older, your body mass index should be between 23-30. Your Body mass index is 35.08 kg/m. If this is out of the aforementioned range listed, please consider follow up with your Primary Care Provider.  If you are age 64 or younger, your body mass index should be between 19-25. Your Body mass index is 35.08 kg/m. If this is out of the aformentioned range listed, please consider follow up with your Primary Care Provider.   You have been scheduled for an endoscopy. Please follow written instructions given to you at your visit today. If you use inhalers (even only as needed), please bring them with you on the day of your procedure. Your physician has requested that you go to www.startemmi.com and enter the access code given to you at your visit today. This web site gives a general overview about your procedure. However, you should still follow specific instructions given to you by our office regarding your preparation for the procedure.  Continue Prevacid 30 mg twice daily.  Continue Benefiber and Miralax 17 grams twice daily.  Continue Linzess 290 mcg daily.  Continue prunes.  Thank you for choosing me and [facility].   Jackquline Denmark, MD

## 2019-09-02 DIAGNOSIS — M545 Low back pain: Secondary | ICD-10-CM | POA: Diagnosis not present

## 2019-09-02 DIAGNOSIS — Z79899 Other long term (current) drug therapy: Secondary | ICD-10-CM | POA: Diagnosis not present

## 2019-09-02 DIAGNOSIS — E7800 Pure hypercholesterolemia, unspecified: Secondary | ICD-10-CM | POA: Diagnosis not present

## 2019-09-02 DIAGNOSIS — M5136 Other intervertebral disc degeneration, lumbar region: Secondary | ICD-10-CM | POA: Diagnosis not present

## 2019-09-05 DIAGNOSIS — R739 Hyperglycemia, unspecified: Secondary | ICD-10-CM | POA: Diagnosis not present

## 2019-09-05 DIAGNOSIS — I1310 Hypertensive heart and chronic kidney disease without heart failure, with stage 1 through stage 4 chronic kidney disease, or unspecified chronic kidney disease: Secondary | ICD-10-CM | POA: Diagnosis not present

## 2019-09-05 DIAGNOSIS — E785 Hyperlipidemia, unspecified: Secondary | ICD-10-CM | POA: Diagnosis not present

## 2019-09-05 DIAGNOSIS — G4700 Insomnia, unspecified: Secondary | ICD-10-CM | POA: Diagnosis not present

## 2019-09-05 DIAGNOSIS — N182 Chronic kidney disease, stage 2 (mild): Secondary | ICD-10-CM | POA: Diagnosis not present

## 2019-09-05 DIAGNOSIS — K219 Gastro-esophageal reflux disease without esophagitis: Secondary | ICD-10-CM | POA: Diagnosis not present

## 2019-09-05 DIAGNOSIS — R05 Cough: Secondary | ICD-10-CM | POA: Diagnosis not present

## 2019-09-07 ENCOUNTER — Ambulatory Visit (AMBULATORY_SURGERY_CENTER): Payer: PPO | Admitting: Gastroenterology

## 2019-09-07 ENCOUNTER — Encounter: Payer: Self-pay | Admitting: Gastroenterology

## 2019-09-07 ENCOUNTER — Other Ambulatory Visit: Payer: Self-pay

## 2019-09-07 VITALS — BP 122/56 | HR 61 | Temp 97.3°F | Resp 14 | Ht 64.0 in | Wt 204.0 lb

## 2019-09-07 DIAGNOSIS — B3781 Candidal esophagitis: Secondary | ICD-10-CM | POA: Diagnosis not present

## 2019-09-07 DIAGNOSIS — K225 Diverticulum of esophagus, acquired: Secondary | ICD-10-CM

## 2019-09-07 DIAGNOSIS — K2970 Gastritis, unspecified, without bleeding: Secondary | ICD-10-CM

## 2019-09-07 DIAGNOSIS — K222 Esophageal obstruction: Secondary | ICD-10-CM

## 2019-09-07 DIAGNOSIS — K219 Gastro-esophageal reflux disease without esophagitis: Secondary | ICD-10-CM

## 2019-09-07 DIAGNOSIS — K317 Polyp of stomach and duodenum: Secondary | ICD-10-CM | POA: Diagnosis not present

## 2019-09-07 DIAGNOSIS — K2950 Unspecified chronic gastritis without bleeding: Secondary | ICD-10-CM | POA: Diagnosis not present

## 2019-09-07 DIAGNOSIS — K449 Diaphragmatic hernia without obstruction or gangrene: Secondary | ICD-10-CM | POA: Diagnosis not present

## 2019-09-07 DIAGNOSIS — R1310 Dysphagia, unspecified: Secondary | ICD-10-CM

## 2019-09-07 MED ORDER — SODIUM CHLORIDE 0.9 % IV SOLN: 500.0000 mL | Freq: Once | INTRAVENOUS | Status: DC

## 2019-09-07 NOTE — Progress Notes (Signed)
pt tolerated well. VSS. awake and to recovery. Report given to RN. Bite block inserted and removed without trauma. 

## 2019-09-07 NOTE — Op Note (Signed)
[place] Patient Name: Karen Castillo Procedure Date: 09/07/2019 10:01 AM MRN: [id number] Endoscopist: Jackquline Denmark , MD Age: 79 Referring MD:  Date of Birth: Oct 19, 1940 Gender: Female Account #: [id number] Procedure:                Upper GI endoscopy Indications:              Dysphagia Medicines:                Monitored Anesthesia Care Procedure:                Pre-Anesthesia Assessment:                           - Prior to the procedure, a History and Physical                            was performed, and patient medications and                            allergies were reviewed. The patient's tolerance of                            previous anesthesia was also reviewed. The risks                            and benefits of the procedure and the sedation                            options and risks were discussed with the patient.                            All questions were answered, and informed consent                            was obtained. Prior Anticoagulants: The patient has                            taken no previous anticoagulant or antiplatelet                            agents. ASA Grade Assessment: III - A patient with                            severe systemic disease. After reviewing the risks                            and benefits, the patient was deemed in                            satisfactory condition to undergo the procedure.                           After obtaining informed consent, the endoscope was  passed under direct vision. Throughout the                            procedure, the patient's blood pressure, pulse, and                            oxygen saturations were monitored continuously. The                            Endoscope was introduced through the mouth, and                            advanced to the second part of duodenum. The upper                            GI endoscopy was accomplished without  difficulty.                            The patient tolerated the procedure well. Scope In: Scope Out: Findings:                 The esophagus was mildly tortuous. Few flat whitish                            lesions were noted in the proximal esophagus.                            Multiple biopsies were obtained to rule out Candida                            esophagitis. One benign-appearing, wide open distal                            esophageal stricture was found at GE junction, 35                            cm from the incisors. The stenosis was traversed.                            The scope was withdrawn. Dilation was performed                            with a Maloney dilator with mild resistance at 50                            Fr. Mid and distal esophageal biopsies were not                            performed since previous biopsies were neg for                            eosinophilic esophagitis.  A small transient hiatal hernia was present.                           Mild inflammation characterized by erythema was                            found in the gastric body and in the gastric                            antrum. Biopsies were taken with a cold forceps for                            histology.                           A few small sessile polyps with no bleeding and no                            stigmata of recent bleeding were found in the                            gastric body and in the gastric antrum. These were                            not removed as these were deemed to be hyperplastic                            previously.                           A 10 mm non-bleeding diverticulum was found in the                            second portion of the duodenum.                           The rest of the examined duodenum was normal. Complications:            No immediate complications. Estimated Blood Loss:     Estimated blood loss:  none. Impression:               - Benign-appearing esophageal stenosis. Dilated.                           - Small transient hiatal hernia.                           - Gastritis. Biopsied.                           - A few incidental gastric polyps.                           - Non-bleeding duodenal diverticulum. Recommendation:           - Post dilatation diet.                           -  Continue present medications including Prevacid.                           - Await pathology results.                           - Patient has a contact number available for                            emergencies. The signs and symptoms of potential                            delayed complications were discussed with the                            patient. Return to normal activities tomorrow.                            Written discharge instructions were provided to the                            patient.                           - The findings and recommendations were discussed                            with the patient. Jackquline Denmark, MD 09/07/2019 10:38:32 AM This report has been signed electronically.

## 2019-09-07 NOTE — Progress Notes (Signed)
VS by CW  Pt's states no medical or surgical changes since previsit or office visit.  

## 2019-09-07 NOTE — Progress Notes (Signed)
Called to room to assist during endoscopic procedure.  Patient ID and intended procedure confirmed with present staff. Received instructions for my participation in the procedure from the performing physician.  

## 2019-09-07 NOTE — Patient Instructions (Signed)
YOU HAD AN ENDOSCOPIC PROCEDURE TODAY AT THE [facility]:   Refer to the procedure report that was given to you for any specific questions about what was found during the examination.  If the procedure report does not answer your questions, please call your gastroenterologist to clarify.  If you requested that your care partner not be given the details of your procedure findings, then the procedure report has been included in a sealed envelope for you to review at your convenience later.  YOU SHOULD EXPECT: Some feelings of bloating in the abdomen. Passage of more gas than usual.  Walking can help get rid of the air that was put into your GI tract during the procedure and reduce the bloating. If you had a lower endoscopy (such as a colonoscopy or flexible sigmoidoscopy) you may notice spotting of blood in your stool or on the toilet paper. If you underwent a bowel prep for your procedure, you may not have a normal bowel movement for a few days.  Please Note:  You might notice some irritation and congestion in your nose or some drainage.  This is from the oxygen used during your procedure.  There is no need for concern and it should clear up in a day or so.  SYMPTOMS TO REPORT IMMEDIATELY:    Following upper endoscopy (EGD)  Vomiting of blood or coffee ground material  New chest pain or pain under the shoulder blades  Painful or persistently difficult swallowing  New shortness of breath  Fever of 100F or higher  Black, tarry-looking stools  For urgent or emergent issues, a gastroenterologist can be reached at any hour by calling [phone]. Do not use MyChart messaging for urgent concerns.    DIET:  We do recommend a small meal at first, but then you may proceed to your regular diet.  Drink plenty of fluids but you should avoid alcoholic beverages for 24 hours.  ACTIVITY:  You should plan to take it easy for the rest of today and you should NOT DRIVE or use heavy machinery  until tomorrow (because of the sedation medicines used during the test).    FOLLOW UP: Our staff will call the number listed on your records 48-72 hours following your procedure to check on you and address any questions or concerns that you may have regarding the information given to you following your procedure. If we do not reach you, we will leave a message.  We will attempt to reach you two times.  During this call, we will ask if you have developed any symptoms of COVID 19. If you develop any symptoms (ie: fever, flu-like symptoms, shortness of breath, cough etc.) before then, please call [phone].  If you test positive for Covid 19 in the 2 weeks post procedure, please call and report this information to us.    If any biopsies were taken you will be contacted by phone or by letter within the next 1-3 weeks.  Please call us at [phone] if you have not heard about the biopsies in 3 weeks.    SIGNATURES/CONFIDENTIALITY: You and/or your care partner have signed paperwork which will be entered into your electronic medical record.  These signatures attest to the fact that that the information above on your After Visit Summary has been reviewed and is understood.  Full responsibility of the confidentiality of this discharge information lies with you and/or your care-partner. 

## 2019-09-09 ENCOUNTER — Telehealth: Payer: Self-pay | Admitting: *Deleted

## 2019-09-09 ENCOUNTER — Telehealth: Payer: Self-pay

## 2019-09-09 NOTE — Telephone Encounter (Signed)
First post procedure follow up call, no answer 

## 2019-09-09 NOTE — Telephone Encounter (Signed)
  Follow up Call-  Call back number 09/07/2019  Post procedure Call Back phone  # [phone]  Permission to leave phone message Yes  Some recent data might be hidden     Patient questions:  Do you have a fever, pain , or abdominal swelling? No. Pain Score  0 *  Have you tolerated food without any problems? Yes.    Have you been able to return to your normal activities? Yes.    Do you have any questions about your discharge instructions: Diet   No. Medications  No. Follow up visit  No.  Do you have questions or concerns about your Care? No.  Actions: * If pain score is 4 or above: No action needed, pain <4.   1. Have you developed a fever since your procedure? no  2.   Have you had an respiratory symptoms (SOB or cough) since your procedure? no  3.   Have you tested positive for COVID 19 since your procedure no  4.   Have you had any family members/close contacts diagnosed with the COVID 19 since your procedure?  no   If yes to any of these questions please route to Joylene John, RN and Erenest Rasher, RN

## 2019-09-19 DIAGNOSIS — N3946 Mixed incontinence: Secondary | ICD-10-CM | POA: Diagnosis not present

## 2019-09-19 DIAGNOSIS — N390 Urinary tract infection, site not specified: Secondary | ICD-10-CM | POA: Diagnosis not present

## 2019-09-20 ENCOUNTER — Encounter: Payer: Self-pay | Admitting: Gastroenterology

## 2019-09-21 ENCOUNTER — Other Ambulatory Visit: Payer: Self-pay | Admitting: Gastroenterology

## 2019-09-21 ENCOUNTER — Telehealth: Payer: Self-pay | Admitting: Gastroenterology

## 2019-09-21 MED ORDER — FLUCONAZOLE 100 MG PO TABS
ORAL_TABLET | ORAL | 0 refills | Status: DC
Start: 2019-09-21 — End: 2020-03-29

## 2019-09-21 NOTE — Telephone Encounter (Signed)
Patient returned your call about results, please call patient one more time.   

## 2019-09-21 NOTE — Telephone Encounter (Signed)
Spoke to patient see result note

## 2019-09-30 DIAGNOSIS — M5136 Other intervertebral disc degeneration, lumbar region: Secondary | ICD-10-CM | POA: Diagnosis not present

## 2019-09-30 DIAGNOSIS — Z79899 Other long term (current) drug therapy: Secondary | ICD-10-CM | POA: Diagnosis not present

## 2019-09-30 DIAGNOSIS — M961 Postlaminectomy syndrome, not elsewhere classified: Secondary | ICD-10-CM | POA: Diagnosis not present

## 2019-09-30 DIAGNOSIS — M545 Low back pain: Secondary | ICD-10-CM | POA: Diagnosis not present

## 2019-09-30 DIAGNOSIS — M62830 Muscle spasm of back: Secondary | ICD-10-CM | POA: Diagnosis not present

## 2019-10-31 DIAGNOSIS — M546 Pain in thoracic spine: Secondary | ICD-10-CM | POA: Diagnosis not present

## 2019-10-31 DIAGNOSIS — M545 Low back pain: Secondary | ICD-10-CM | POA: Diagnosis not present

## 2019-10-31 DIAGNOSIS — M961 Postlaminectomy syndrome, not elsewhere classified: Secondary | ICD-10-CM | POA: Diagnosis not present

## 2019-10-31 DIAGNOSIS — Z79899 Other long term (current) drug therapy: Secondary | ICD-10-CM | POA: Diagnosis not present

## 2019-10-31 DIAGNOSIS — M5136 Other intervertebral disc degeneration, lumbar region: Secondary | ICD-10-CM | POA: Diagnosis not present

## 2019-11-02 DIAGNOSIS — H26492 Other secondary cataract, left eye: Secondary | ICD-10-CM | POA: Diagnosis not present

## 2019-11-02 DIAGNOSIS — H401132 Primary open-angle glaucoma, bilateral, moderate stage: Secondary | ICD-10-CM | POA: Diagnosis not present

## 2019-11-02 DIAGNOSIS — H16223 Keratoconjunctivitis sicca, not specified as Sjogren's, bilateral: Secondary | ICD-10-CM | POA: Diagnosis not present

## 2019-11-02 DIAGNOSIS — Z961 Presence of intraocular lens: Secondary | ICD-10-CM | POA: Diagnosis not present

## 2019-11-02 DIAGNOSIS — H04123 Dry eye syndrome of bilateral lacrimal glands: Secondary | ICD-10-CM | POA: Diagnosis not present

## 2019-11-07 DIAGNOSIS — M546 Pain in thoracic spine: Secondary | ICD-10-CM | POA: Diagnosis not present

## 2019-11-10 DIAGNOSIS — M65331 Trigger finger, right middle finger: Secondary | ICD-10-CM | POA: Diagnosis not present

## 2019-11-10 DIAGNOSIS — M1811 Unilateral primary osteoarthritis of first carpometacarpal joint, right hand: Secondary | ICD-10-CM | POA: Diagnosis not present

## 2019-11-30 DIAGNOSIS — Z9181 History of falling: Secondary | ICD-10-CM | POA: Diagnosis not present

## 2019-11-30 DIAGNOSIS — E785 Hyperlipidemia, unspecified: Secondary | ICD-10-CM | POA: Diagnosis not present

## 2019-11-30 DIAGNOSIS — Z0000 Encounter for general adult medical examination without abnormal findings: Secondary | ICD-10-CM | POA: Diagnosis not present

## 2019-11-30 DIAGNOSIS — E669 Obesity, unspecified: Secondary | ICD-10-CM | POA: Diagnosis not present

## 2019-11-30 DIAGNOSIS — Z1331 Encounter for screening for depression: Secondary | ICD-10-CM | POA: Diagnosis not present

## 2019-12-01 DIAGNOSIS — M419 Scoliosis, unspecified: Secondary | ICD-10-CM | POA: Diagnosis not present

## 2019-12-01 DIAGNOSIS — M545 Low back pain: Secondary | ICD-10-CM | POA: Diagnosis not present

## 2019-12-01 DIAGNOSIS — M546 Pain in thoracic spine: Secondary | ICD-10-CM | POA: Diagnosis not present

## 2019-12-01 DIAGNOSIS — Z79899 Other long term (current) drug therapy: Secondary | ICD-10-CM | POA: Diagnosis not present

## 2019-12-01 DIAGNOSIS — Z23 Encounter for immunization: Secondary | ICD-10-CM | POA: Diagnosis not present

## 2019-12-01 DIAGNOSIS — M961 Postlaminectomy syndrome, not elsewhere classified: Secondary | ICD-10-CM | POA: Diagnosis not present

## 2019-12-13 DIAGNOSIS — M1811 Unilateral primary osteoarthritis of first carpometacarpal joint, right hand: Secondary | ICD-10-CM | POA: Diagnosis not present

## 2019-12-13 DIAGNOSIS — M65322 Trigger finger, left index finger: Secondary | ICD-10-CM | POA: Diagnosis not present

## 2019-12-13 DIAGNOSIS — M65331 Trigger finger, right middle finger: Secondary | ICD-10-CM | POA: Diagnosis not present

## 2019-12-15 DIAGNOSIS — M67431 Ganglion, right wrist: Secondary | ICD-10-CM | POA: Diagnosis not present

## 2019-12-30 DIAGNOSIS — Z79899 Other long term (current) drug therapy: Secondary | ICD-10-CM | POA: Diagnosis not present

## 2019-12-30 DIAGNOSIS — M419 Scoliosis, unspecified: Secondary | ICD-10-CM | POA: Diagnosis not present

## 2019-12-30 DIAGNOSIS — E7800 Pure hypercholesterolemia, unspecified: Secondary | ICD-10-CM | POA: Diagnosis not present

## 2019-12-30 DIAGNOSIS — M961 Postlaminectomy syndrome, not elsewhere classified: Secondary | ICD-10-CM | POA: Diagnosis not present

## 2019-12-30 DIAGNOSIS — D539 Nutritional anemia, unspecified: Secondary | ICD-10-CM | POA: Diagnosis not present

## 2019-12-30 DIAGNOSIS — R5383 Other fatigue: Secondary | ICD-10-CM | POA: Diagnosis not present

## 2019-12-30 DIAGNOSIS — M545 Low back pain, unspecified: Secondary | ICD-10-CM | POA: Diagnosis not present

## 2019-12-30 DIAGNOSIS — M129 Arthropathy, unspecified: Secondary | ICD-10-CM | POA: Diagnosis not present

## 2019-12-30 DIAGNOSIS — E559 Vitamin D deficiency, unspecified: Secondary | ICD-10-CM | POA: Diagnosis not present

## 2019-12-30 DIAGNOSIS — M546 Pain in thoracic spine: Secondary | ICD-10-CM | POA: Diagnosis not present

## 2019-12-30 DIAGNOSIS — Z20822 Contact with and (suspected) exposure to covid-19: Secondary | ICD-10-CM | POA: Diagnosis not present

## 2020-01-05 DIAGNOSIS — R739 Hyperglycemia, unspecified: Secondary | ICD-10-CM | POA: Diagnosis not present

## 2020-01-05 DIAGNOSIS — Z6836 Body mass index (BMI) 36.0-36.9, adult: Secondary | ICD-10-CM | POA: Diagnosis not present

## 2020-01-05 DIAGNOSIS — E785 Hyperlipidemia, unspecified: Secondary | ICD-10-CM | POA: Diagnosis not present

## 2020-01-05 DIAGNOSIS — T23122A Burn of first degree of single left finger (nail) except thumb, initial encounter: Secondary | ICD-10-CM | POA: Diagnosis not present

## 2020-01-05 DIAGNOSIS — G4700 Insomnia, unspecified: Secondary | ICD-10-CM | POA: Diagnosis not present

## 2020-01-05 DIAGNOSIS — K219 Gastro-esophageal reflux disease without esophagitis: Secondary | ICD-10-CM | POA: Diagnosis not present

## 2020-01-05 DIAGNOSIS — R609 Edema, unspecified: Secondary | ICD-10-CM | POA: Diagnosis not present

## 2020-01-05 DIAGNOSIS — I1310 Hypertensive heart and chronic kidney disease without heart failure, with stage 1 through stage 4 chronic kidney disease, or unspecified chronic kidney disease: Secondary | ICD-10-CM | POA: Diagnosis not present

## 2020-01-05 DIAGNOSIS — N182 Chronic kidney disease, stage 2 (mild): Secondary | ICD-10-CM | POA: Diagnosis not present

## 2020-01-27 ENCOUNTER — Telehealth: Payer: Self-pay | Admitting: Oncology

## 2020-01-27 NOTE — Telephone Encounter (Signed)
Per Patient's daughter, Mammo rescheduled to 12/23 - Reschedule Follow up to 12/27 - Gave New Appointment to daughter

## 2020-01-30 ENCOUNTER — Inpatient Hospital Stay: Payer: PPO | Admitting: Oncology

## 2020-02-01 DIAGNOSIS — G4700 Insomnia, unspecified: Secondary | ICD-10-CM | POA: Diagnosis not present

## 2020-02-01 DIAGNOSIS — M961 Postlaminectomy syndrome, not elsewhere classified: Secondary | ICD-10-CM | POA: Diagnosis not present

## 2020-02-01 DIAGNOSIS — Z79899 Other long term (current) drug therapy: Secondary | ICD-10-CM | POA: Diagnosis not present

## 2020-02-01 DIAGNOSIS — M545 Low back pain, unspecified: Secondary | ICD-10-CM | POA: Diagnosis not present

## 2020-02-01 DIAGNOSIS — M546 Pain in thoracic spine: Secondary | ICD-10-CM | POA: Diagnosis not present

## 2020-02-09 ENCOUNTER — Other Ambulatory Visit: Payer: Self-pay

## 2020-02-09 DIAGNOSIS — E7800 Pure hypercholesterolemia, unspecified: Secondary | ICD-10-CM | POA: Insufficient documentation

## 2020-02-09 DIAGNOSIS — R300 Dysuria: Secondary | ICD-10-CM | POA: Insufficient documentation

## 2020-02-09 DIAGNOSIS — M1990 Unspecified osteoarthritis, unspecified site: Secondary | ICD-10-CM | POA: Insufficient documentation

## 2020-02-09 DIAGNOSIS — K589 Irritable bowel syndrome without diarrhea: Secondary | ICD-10-CM | POA: Insufficient documentation

## 2020-02-09 DIAGNOSIS — H409 Unspecified glaucoma: Secondary | ICD-10-CM | POA: Insufficient documentation

## 2020-02-09 DIAGNOSIS — H04123 Dry eye syndrome of bilateral lacrimal glands: Secondary | ICD-10-CM | POA: Insufficient documentation

## 2020-02-09 DIAGNOSIS — K8020 Calculus of gallbladder without cholecystitis without obstruction: Secondary | ICD-10-CM | POA: Insufficient documentation

## 2020-02-09 DIAGNOSIS — C801 Malignant (primary) neoplasm, unspecified: Secondary | ICD-10-CM | POA: Insufficient documentation

## 2020-02-09 DIAGNOSIS — K219 Gastro-esophageal reflux disease without esophagitis: Secondary | ICD-10-CM | POA: Insufficient documentation

## 2020-02-09 DIAGNOSIS — E669 Obesity, unspecified: Secondary | ICD-10-CM | POA: Insufficient documentation

## 2020-02-09 DIAGNOSIS — F319 Bipolar disorder, unspecified: Secondary | ICD-10-CM | POA: Insufficient documentation

## 2020-02-09 DIAGNOSIS — D649 Anemia, unspecified: Secondary | ICD-10-CM | POA: Insufficient documentation

## 2020-02-09 DIAGNOSIS — G4730 Sleep apnea, unspecified: Secondary | ICD-10-CM | POA: Insufficient documentation

## 2020-02-09 DIAGNOSIS — G894 Chronic pain syndrome: Secondary | ICD-10-CM | POA: Insufficient documentation

## 2020-02-09 DIAGNOSIS — E569 Vitamin deficiency, unspecified: Secondary | ICD-10-CM | POA: Insufficient documentation

## 2020-02-09 DIAGNOSIS — F209 Schizophrenia, unspecified: Secondary | ICD-10-CM | POA: Insufficient documentation

## 2020-02-09 DIAGNOSIS — K5909 Other constipation: Secondary | ICD-10-CM | POA: Insufficient documentation

## 2020-02-09 DIAGNOSIS — J309 Allergic rhinitis, unspecified: Secondary | ICD-10-CM | POA: Insufficient documentation

## 2020-02-09 DIAGNOSIS — M109 Gout, unspecified: Secondary | ICD-10-CM | POA: Insufficient documentation

## 2020-02-09 DIAGNOSIS — G4700 Insomnia, unspecified: Secondary | ICD-10-CM | POA: Insufficient documentation

## 2020-02-13 ENCOUNTER — Ambulatory Visit: Payer: PPO | Admitting: Cardiology

## 2020-02-13 ENCOUNTER — Encounter: Payer: Self-pay | Admitting: *Deleted

## 2020-02-13 ENCOUNTER — Other Ambulatory Visit: Payer: Self-pay

## 2020-02-13 ENCOUNTER — Encounter: Payer: Self-pay | Admitting: Cardiology

## 2020-02-13 VITALS — BP 140/70 | HR 68 | Ht 64.0 in | Wt 210.8 lb

## 2020-02-13 DIAGNOSIS — I10 Essential (primary) hypertension: Secondary | ICD-10-CM | POA: Diagnosis not present

## 2020-02-13 DIAGNOSIS — G4733 Obstructive sleep apnea (adult) (pediatric): Secondary | ICD-10-CM

## 2020-02-13 DIAGNOSIS — I2510 Atherosclerotic heart disease of native coronary artery without angina pectoris: Secondary | ICD-10-CM

## 2020-02-13 DIAGNOSIS — E669 Obesity, unspecified: Secondary | ICD-10-CM | POA: Diagnosis not present

## 2020-02-13 DIAGNOSIS — E66811 Obesity, class 1: Secondary | ICD-10-CM

## 2020-02-13 HISTORY — DX: Atherosclerotic heart disease of native coronary artery without angina pectoris: I25.10

## 2020-02-13 HISTORY — DX: Obesity, unspecified: E66.9

## 2020-02-13 HISTORY — DX: Obesity, class 1: E66.811

## 2020-02-13 NOTE — Progress Notes (Signed)
Cardiology Office Note:    Date:  02/13/2020   ID:  Karen Castillo, Karen Castillo 12-01-1940, MRN [id number]  PCP:  Nicoletta Dress, MD  Cardiologist:  Berniece Salines, DO  Electrophysiologist:  None   Referring MD: Nicoletta Dress, MD   " I am doing well"   History of Present Illness:    Karen Castillo is a 79 y.o. female with a hx of coronary artery disease on CTA, hypertension, hyperlipidemia and sleep apnea here today for a follow-up visit she offers no complaints at this time. She does have some back pain which caused some limitation but otherwise no chest pain or shortness of breath.  Past Medical History:  Diagnosis Date  . Abdominal pain   . Acute post-operative pain 07/13/2017  . Allergic rhinitis   . Anemia   . Arthritis   . Bipolar disorder ([place])    patient denies  . Cancer (HCC)    SKIN    ,    LEFT BREAST   . Chronic bilateral low back pain 09/16/2016  . Chronic constipation   . Chronic pain syndrome   . Constipation   . 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 Dry eyes   . Dysuria   . Elevated cholesterol   . 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 Gallstones   . GERD (gastroesophageal reflux disease)   . Glaucoma    both eyes  . Gout   . Hypertension   . IBS (irritable bowel syndrome)   . Idiopathic peripheral neuropathy 06/11/2015  . 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 Ingrown nail of great toe of right foot 05/04/2017  . Insomnia   . Intractable low back pain 07/13/2017  . Low back pain 08/27/2017  . Nausea without vomiting   . Obesity   . Pain syndrome, chronic 11/29/2015  . Paronychia of great toe, right 05/04/2017  . Postoperative anemia due to acute blood loss 08/29/2017  . 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 Radiculopathy 05/04/2014  . Radiculopathy of lumbar region 11/29/2015  . Risk for falls 06/11/2015  . Schizophrenia ([place])   . Sleep apnea    USES CPAP   . Sleep apnea, obstructive 04/03/2015  . Urticaria   . Vitamin deficiency       Past Surgical History:  Procedure Laterality Date  . ABDOMINAL EXPOSURE N/A 05/04/2014   Procedure: ABDOMINAL EXPOSURE;  Surgeon: Serafina Mitchell, MD;  Location: [facility];  Service: Vascular;  Laterality: N/A;  . ANTERIOR LUMBAR FUSION N/A 05/04/2014   Procedure: ANTERIOR LUMBAR FUSION 1 LEVEL;  Surgeon: Sinclair Ship, MD;  Location: [facility];  Service: Orthopedics;  Laterality: N/A;  Lumbar 5-sacrum 1 anterior lumbar interbody fusion with instrumentation and allograft  . ANTERIOR LUMBAR FUSION  07/2017  . ANTERIOR LUMBAR FUSION  08/2017  . BACK SURGERY     2010 ,2015LUMB FUSION, 06/2013 [facility] FUSION  . BACK SURGERY  07/2017   Lumbar  . BREAST BIOPSY  2013   pt said it was positive   . BREAST SURGERY     LEFT BRREAST BX  . CARPAL TUNNEL RELEASE     1994 RT  . CARPAL TUNNEL RELEASE Bilateral   . CATARACT EXTRACTION, BILATERAL  2017  . CHOLECYSTECTOMY     2004  . COLONOSCOPY W/ BIOPSIES     Colonoscopy 1517,6160, 2009, 2011  . ERCP     2009  . ESOPHAGOGASTRODUODENOSCOPY  08/09/2017   Small hiatal hernia. Mild gastritis. Incidenta; duodenal diverticulum.  Incidental gastric polyp.   Marland Kitchen LEEP     1992 and 1994  . SKIN CANCER EXCISION     X 4   . thorocolumbar fusion  06/2013  . TUBAL LIGATION      Current Medications: Current Meds  Medication Sig  . alendronate (FOSAMAX) 70 MG tablet Take 70 mg by mouth every Thursday. Take with a full glass of water on an empty stomach.  Marland Kitchen allopurinol (ZYLOPRIM) 100 MG tablet Take 100 mg by mouth daily.  Marland Kitchen amLODipine (NORVASC) 10 MG tablet Take 10 mg by mouth daily.  Marland Kitchen ascorbic acid (VITAMIN C) 500 MG tablet Take 1,000 mg by mouth daily.  Marland Kitchen aspirin EC 81 MG tablet Take 81 mg by mouth daily.  . benzonatate (TESSALON) 100 MG capsule Take 100-200 mg by mouth every 8 (eight) hours as needed.  . Biotin 10 MG TABS Take 10 mg by mouth daily.  . calcium carbonate (OSCAL) 1500 (600 Ca) MG TABS tablet Take 600 mg of elemental calcium by mouth 3  (three) times daily with meals.  . Cholecalciferol (VITAMIN D3) 2000 units TABS Take 1 tablet by mouth daily.  . cloNIDine (CATAPRES) 0.1 MG tablet Take 0.1 mg by mouth at bedtime.  . cycloSPORINE (RESTASIS) 0.05 % ophthalmic emulsion Place 1 drop into both eyes 2 (two) times daily.   . fluconazole (DIFLUCAN) 100 MG tablet Take Fluconazole 200 mg for 1 day. Then followed by Fluconazole 100 mg daily for 10 days  . hydrochlorothiazide (HYDRODIURIL) 25 MG tablet Take 25 mg by mouth daily.  Marland Kitchen HYDROmorphone (DILAUDID) 4 MG tablet Take 4 mg by mouth.  . hydrOXYzine (ATARAX/VISTARIL) 25 MG tablet Take 25 mg by mouth daily.  . Lactobacillus (PROBIOTIC ACIDOPHILUS PO) Take 1 tablet by mouth daily.  . lansoprazole (PREVACID) 30 MG capsule Take 1 capsule (30 mg total) by mouth daily before breakfast. 30 minutes before breakfast  . latanoprost (XALATAN) 0.005 % ophthalmic solution Place 1 drop into both eyes at bedtime.  Marland Kitchen linaclotide (LINZESS) 290 MCG CAPS capsule Take 1 capsule (290 mcg total) by mouth daily before breakfast.  . LIVALO 2 MG TABS Take 1 tablet by mouth at bedtime.  . Magnesium (V-R MAGNESIUM) 250 MG TABS Take 250 mg by mouth daily.  . methocarbamol (ROBAXIN) 750 MG tablet Take 750 mg by mouth 3 (three) times daily.  . nadolol (CORGARD) 40 MG tablet Take 60 mg by mouth daily.  Marland Kitchen NARCAN 4 MG/0.1ML LIQD nasal spray kit Place 4 mg into the nose. I spray in both nares PRN  . nitroGLYCERIN (NITROSTAT) 0.4 MG SL tablet Place 1 tablet (0.4 mg total) under the tongue every 5 (five) minutes as needed for chest pain.  . Omega-3 Fatty Acids (FISH OIL) 600 MG CAPS Take 600 mg by mouth 2 (two) times daily.  Marland Kitchen oxybutynin (DITROPAN) 5 MG tablet Take 5 mg by mouth 2 (two) times daily.  . polyethylene glycol (MIRALAX / GLYCOLAX) 17 g packet Take 17 g by mouth daily as needed for mild constipation.  Marland Kitchen spironolactone (ALDACTONE) 25 MG tablet Take 25 mg by mouth daily.  . sucralfate (CARAFATE) 1 GM/10ML  suspension Take 10 mLs (1 g total) by mouth 4 (four) times daily -  with meals and at bedtime.  . timolol (BETIMOL) 0.5 % ophthalmic solution Place 1 drop into both eyes daily. In the morning  . timolol (TIMOPTIC) 0.5 % ophthalmic solution SMARTSIG:In Eye(s)  . trazodone (DESYREL) 300 MG tablet Take 300 mg by mouth  at bedtime.  Marland Kitchen trimethoprim (TRIMPEX) 100 MG tablet Take 100 mg by mouth daily.  . valsartan (DIOVAN) 80 MG tablet Take 80 mg by mouth daily.  . Wheat Dextrin (BENEFIBER) POWD Take 1 packet by mouth daily. Clear and Sugar FREE. 1 tablespoon daily     Allergies:   Ciprofloxacin, Cyclobenzaprine, Pregabalin, Fluvastatin, Meloxicam, Metaxalone, Pramipexole, Pravastatin, Rosuvastatin, Sertraline, Statins, Other, Pravachol [pravastatin sodium], Amitriptyline, Arthrotec [diclofenac-misoprostol], Augmentin [amoxicillin-pot clavulanate], Bromfenac, Cephalexin, Conj estrog-medroxyprogest ace, Diclofenac-misoprostol, Erythromycin, Esomeprazole, Esomeprazole magnesium, Levofloxacin, Methadone, Metoclopramide, Oxycodone, Propoxyphene, Rofecoxib, Sulfur, Tramadol, and Zoloft [sertraline hcl]   Social History   Socioeconomic History  . Marital status: Widowed    Spouse name: Not on file  . Number of children: 2  . Years of education: Not on file  . Highest education level: Not on file  Occupational History  . Occupation: Retired  Tobacco Use  . Smoking status: Never Smoker  . Smokeless tobacco: Never Used  Vaping Use  . Vaping Use: Never used  Substance and Sexual Activity  . Alcohol use: No  . Drug use: No  . Sexual activity: Not on file  Other Topics Concern  . Not on file  Social History Narrative  . Not on file   Social Determinants of Health   Financial Resource Strain:   . Difficulty of Paying Living Expenses: Not on file  Food Insecurity:   . Worried About Charity fundraiser in the Last Year: Not on file  . Ran Out of Food in the Last Year: Not on file  Transportation  Needs:   . Lack of Transportation (Medical): Not on file  . Lack of Transportation (Non-Medical): Not on file  Physical Activity:   . Days of Exercise per Week: Not on file  . Minutes of Exercise per Session: Not on file  Stress:   . Feeling of Stress : Not on file  Social Connections:   . Frequency of Communication with Friends and Family: Not on file  . Frequency of Social Gatherings with Friends and Family: Not on file  . Attends Religious Services: Not on file  . Active Member of Clubs or Organizations: Not on file  . Attends Archivist Meetings: Not on file  . Marital Status: Not on file     Family History: The patient's family history includes Diabetes in her mother; Prostate cancer in her father; Stroke in her sister. There is no history of Colon cancer, Esophageal cancer, Stomach cancer, or Rectal cancer.  ROS:   Review of Systems  Constitution: Negative for decreased appetite, fever and weight gain.  HENT: Negative for congestion, ear discharge, hoarse voice and sore throat.   Eyes: Negative for discharge, redness, vision loss in right eye and visual halos.  Cardiovascular: Negative for chest pain, dyspnea on exertion, leg swelling, orthopnea and palpitations.  Respiratory: Negative for cough, hemoptysis, shortness of breath and snoring.   Endocrine: Negative for heat intolerance and polyphagia.  Hematologic/Lymphatic: Negative for bleeding problem. Does not bruise/bleed easily.  Skin: Negative for flushing, nail changes, rash and suspicious lesions.  Musculoskeletal: Negative for arthritis, joint pain, muscle cramps, myalgias, neck pain and stiffness.  Gastrointestinal: Negative for abdominal pain, bowel incontinence, diarrhea and excessive appetite.  Genitourinary: Negative for decreased libido, genital sores and incomplete emptying.  Neurological: Negative for brief paralysis, focal weakness, headaches and loss of balance.  Psychiatric/Behavioral: Negative for  altered mental status, depression and suicidal ideas.  Allergic/Immunologic: Negative for HIV exposure and persistent infections.  EKGs/Labs/Other Studies Reviewed:    The following studies were reviewed today:   EKG:  The ekg ordered today demonstrates    FFRct results FINDINGS: All values in normal range Specifically RCA 0.98 LAD 0.90 and Circumflex 0.97  IMPRESSION: Negative FFR CT   CCTA   Calcium Score: LM and 3 vessel calcium noted  Coronary Arteries: Right dominant with no anomalies  LM: 1-24% calcified ostial plaque  LAD: 50-69% long calcified plaque proximally 25-49% mixed plaque in mid vessel and 1-24% calcified plaque distally  D1: Normal  D2: Normal  Circumflex: 1-24% mixed plaque in mid vessel involving OM2  OM1: Normal high take off  RCA: 1-24% calcified plaque proximally 25-49% calcified plaque mid vessel 1-24% calcified plaque distally  PDA: Normal  PLA: 1-24% soft plaque  IMPRESSION: 1. Normal aortic root 3.2 cm with moderate calcific atherosclerosis  2. Calcium score 1213 involving LM and 3 vessels this is 95 th percentile for age and sex  19. CAD see description above study sent for FFR CT to r/o obstructive disease in mid RCA and proximal LAD  [place] [place]    Recent Labs: 07/21/2019: ALT 15; BUN 10; Creatinine, Ser 0.81; Hemoglobin 12.0; Platelets 274; Potassium 3.7; Sodium 133  Recent Lipid Panel No results found for: CHOL, TRIG, HDL, CHOLHDL, VLDL, LDLCALC, LDLDIRECT  Physical Exam:    VS:  BP 140/70   Pulse 68   Ht 5' 4"  (1.626 m)   Wt 210 lb 12.8 oz (95.6 kg)   SpO2 97%   BMI 36.18 kg/m     Wt Readings from Last 3 Encounters:  02/13/20 210 lb 12.8 oz (95.6 kg)  09/07/19 204 lb (92.5 kg)  08/23/19 204 lb 6 oz (92.7 kg)     GEN: Well nourished, well developed in no acute distress HEENT: Normal NECK: No JVD; No carotid bruits LYMPHATICS: No lymphadenopathy CARDIAC: S1S2 noted,RRR, no murmurs,  rubs, gallops RESPIRATORY:  Clear to auscultation without rales, wheezing or rhonchi  ABDOMEN: Soft, non-tender, non-distended, +bowel sounds, no guarding. EXTREMITIES: No edema, No cyanosis, no clubbing MUSCULOSKELETAL:  No deformity  SKIN: Warm and dry NEUROLOGIC:  Alert and oriented x 3, non-focal PSYCHIATRIC:  Normal affect, good insight  ASSESSMENT:    1. Primary hypertension   2. Obstructive sleep apnea syndrome   3. Coronary artery disease involving native coronary artery of native heart without angina pectoris   4. Obesity (BMI 30.0-34.9)    PLAN:    She appears to be doing well from a cardiovascular standpoint.  We will continue her current medication regimen.  We discussed why her nitroglycerin is still important for [place] to keep as she does have coronary artery disease and could have angina anytime.  Nitroglycerin will be helpful in relieving her symptoms.  Continue with CPAP. Blood pressure deceptively in the office no changes will be made to the antihypertensive medication regimen. The patient understands the need to lose weight with diet and exercise. We have discussed specific strategies for this.  The patient is in agreement with the above plan. The patient left the office in stable condition.  The patient will follow up in 6 months or sooner if needed.   Medication Adjustments/Labs and Tests Ordered: Current medicines are reviewed at length with the patient today.  Concerns regarding medicines are outlined above.  No orders of the defined types were placed in this encounter.  No orders of the defined types were placed in this encounter.   Patient Instructions  Medication Instructions:  Your physician recommends that you continue on your current medications as directed. Please refer to the Current Medication list given to you today.  *If you need a refill on your cardiac medications before your next appointment, please call your pharmacy*   Lab Work: NONE If  you have labs (blood work) drawn today and your tests are completely normal, you will receive your results only by: Marland Kitchen MyChart Message (if you have MyChart) OR . A paper copy in the mail If you have any lab test that is abnormal or we need to change your treatment, we will call you to review the results.   Testing/Procedures: NONE   Follow-Up: At [facility], you and your health needs are our priority.  As part of our continuing mission to provide you with exceptional heart care, we have created designated Provider Care Teams.  These Care Teams include your primary Cardiologist (physician) and Advanced Practice Providers (APPs -  Physician Assistants and Nurse Practitioners) who all work together to provide you with the care you need, when you need it.  We recommend signing up for the patient portal called "MyChart".  Sign up information is provided on this After Visit Summary.  MyChart is used to connect with patients for Virtual Visits (Telemedicine).  Patients are able to view lab/test results, encounter notes, upcoming appointments, etc.  Non-urgent messages can be sent to your provider as well.   To learn more about what you can do with MyChart, go to [email or web address].    Your next appointment:   6 month(s)  The format for your next appointment:   In Person  Provider:   Berniece Salines, DO   Other Instructions      Adopting a Healthy Lifestyle.  Know what a healthy weight is for you (roughly BMI <25) and aim to maintain this   Aim for 7+ servings of fruits and vegetables daily   65-80+ fluid ounces of water or unsweet tea for healthy kidneys   Limit to max 1 drink of alcohol per day; avoid smoking/tobacco   Limit animal fats in diet for cholesterol and heart health - choose grass fed whenever available   Avoid highly processed foods, and foods high in saturated/trans fats   Aim for low stress - take time to unwind and care for your mental health   Aim for 150  min of moderate intensity exercise weekly for heart health, and weights twice weekly for bone health   Aim for 7-9 hours of sleep daily   When it comes to diets, agreement about the perfect plan isnt easy to find, even among the experts. Experts at the [place] developed an idea known as the Healthy Eating Plate. Just imagine a plate divided into logical, healthy portions.   The emphasis is on diet quality:   Load up on vegetables and fruits - one-half of your plate: Aim for color and variety, and remember that potatoes dont count.   Go for whole grains - one-quarter of your plate: Whole wheat, barley, wheat berries, quinoa, oats, brown rice, and foods made with them. If you want pasta, go with whole wheat pasta.   Protein power - one-quarter of your plate: Fish, chicken, beans, and nuts are all healthy, versatile protein sources. Limit red meat.   The diet, however, does go beyond the plate, offering a few other suggestions.   Use healthy plant oils, such as olive, canola, soy, corn, sunflower and peanut. Check the labels, and avoid partially  hydrogenated oil, which have unhealthy trans fats.   If youre thirsty, drink water. Coffee and tea are good in moderation, but skip sugary drinks and limit milk and dairy products to one or two daily servings.   The type of carbohydrate in the diet is more important than the amount. Some sources of carbohydrates, such as vegetables, fruits, whole grains, and beans-are healthier than others.   Finally, stay active  Signed, Berniece Salines, DO  02/13/2020 1:52 PM    [facility]

## 2020-02-13 NOTE — Patient Instructions (Signed)

## 2020-02-27 DIAGNOSIS — G4700 Insomnia, unspecified: Secondary | ICD-10-CM | POA: Diagnosis not present

## 2020-02-27 DIAGNOSIS — Z79899 Other long term (current) drug therapy: Secondary | ICD-10-CM | POA: Diagnosis not present

## 2020-02-27 DIAGNOSIS — M546 Pain in thoracic spine: Secondary | ICD-10-CM | POA: Diagnosis not present

## 2020-02-27 DIAGNOSIS — M961 Postlaminectomy syndrome, not elsewhere classified: Secondary | ICD-10-CM | POA: Diagnosis not present

## 2020-02-27 DIAGNOSIS — M545 Low back pain, unspecified: Secondary | ICD-10-CM | POA: Diagnosis not present

## 2020-02-28 ENCOUNTER — Telehealth: Payer: Self-pay | Admitting: Oncology

## 2020-02-28 NOTE — Telephone Encounter (Signed)
Per patient's daughter, patient rescheduled Mammogram to 03/23/2020.  Follow Up Appt rescheduled to 03/29/2020 at 2:30 pm.  Gave updated Appt Summary to daughter

## 2020-03-05 ENCOUNTER — Inpatient Hospital Stay: Payer: PPO | Admitting: Oncology

## 2020-03-09 IMAGING — DX DG HIP (WITH OR WITHOUT PELVIS) 1V PORT*R*
2 series · 2 of 2 positions shown · non-contrast
Comparison: 07/09/2017

CLINICAL DATA: Pelvic pain

EXAM:
DG HIP (WITH OR WITHOUT PELVIS) 1V PORT RIGHT

[pelvis ap]
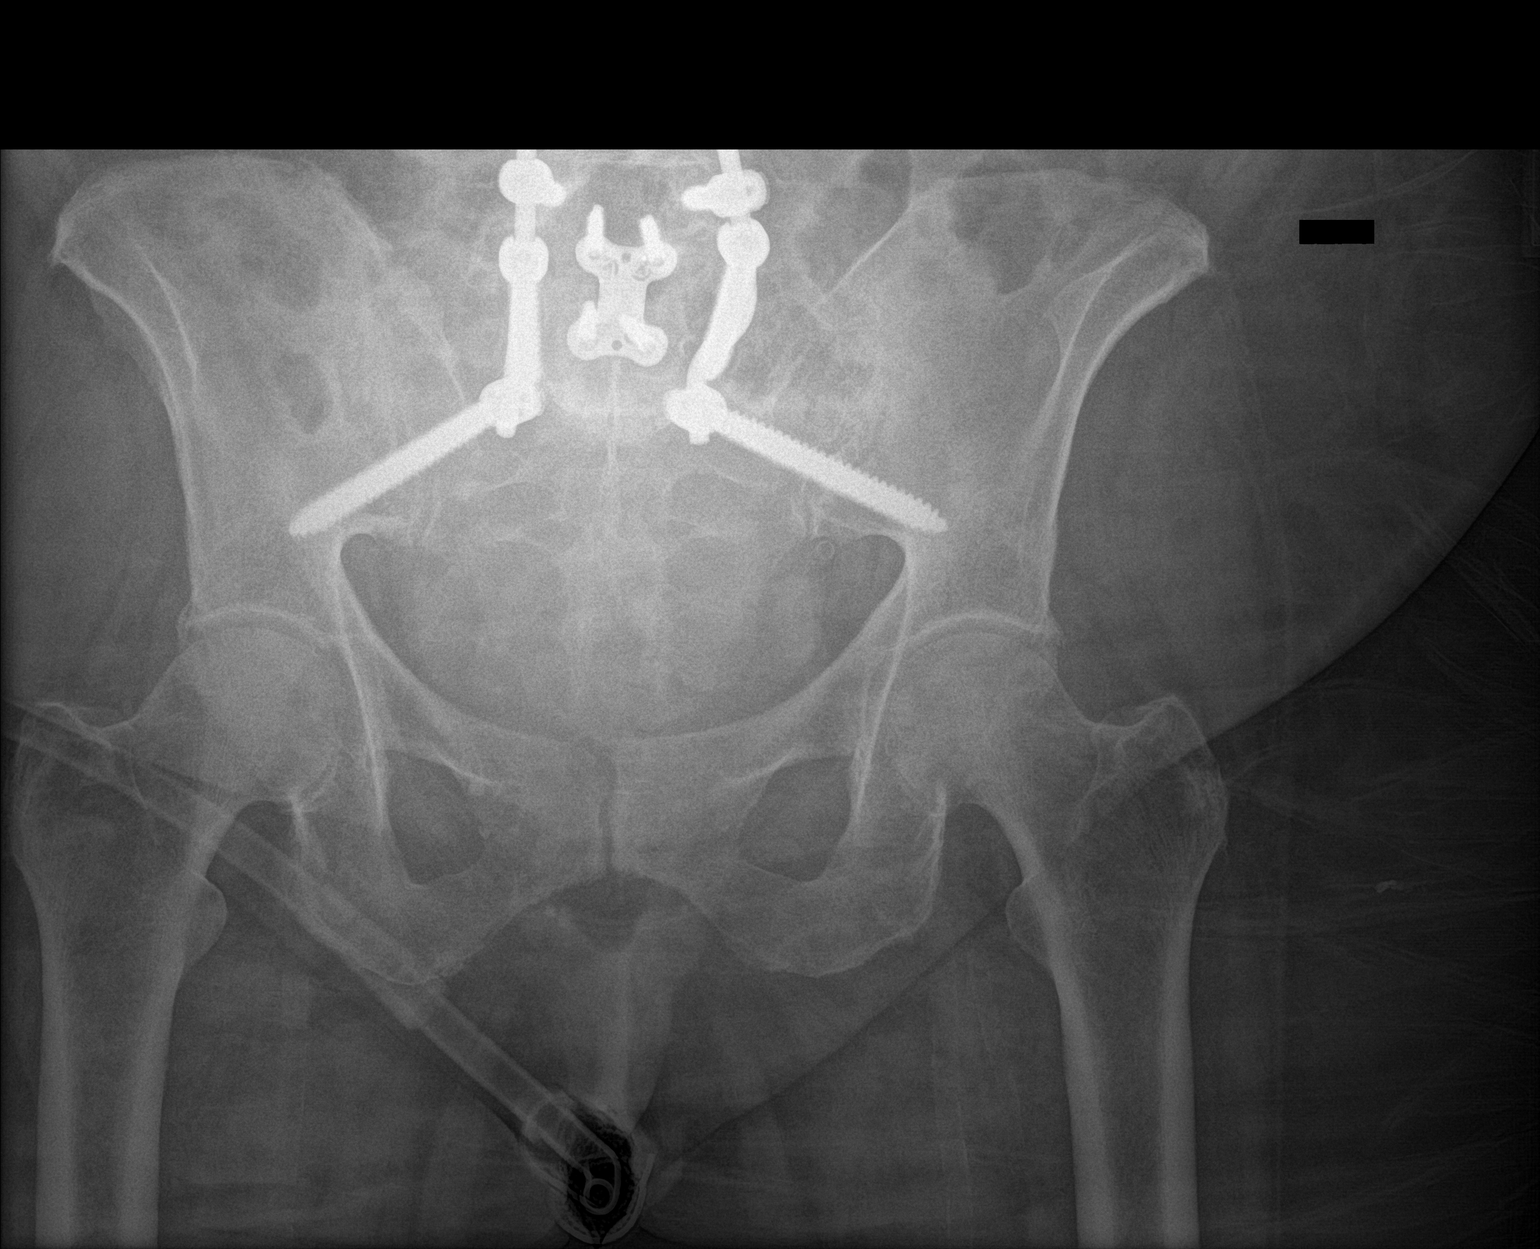

[hip ap]
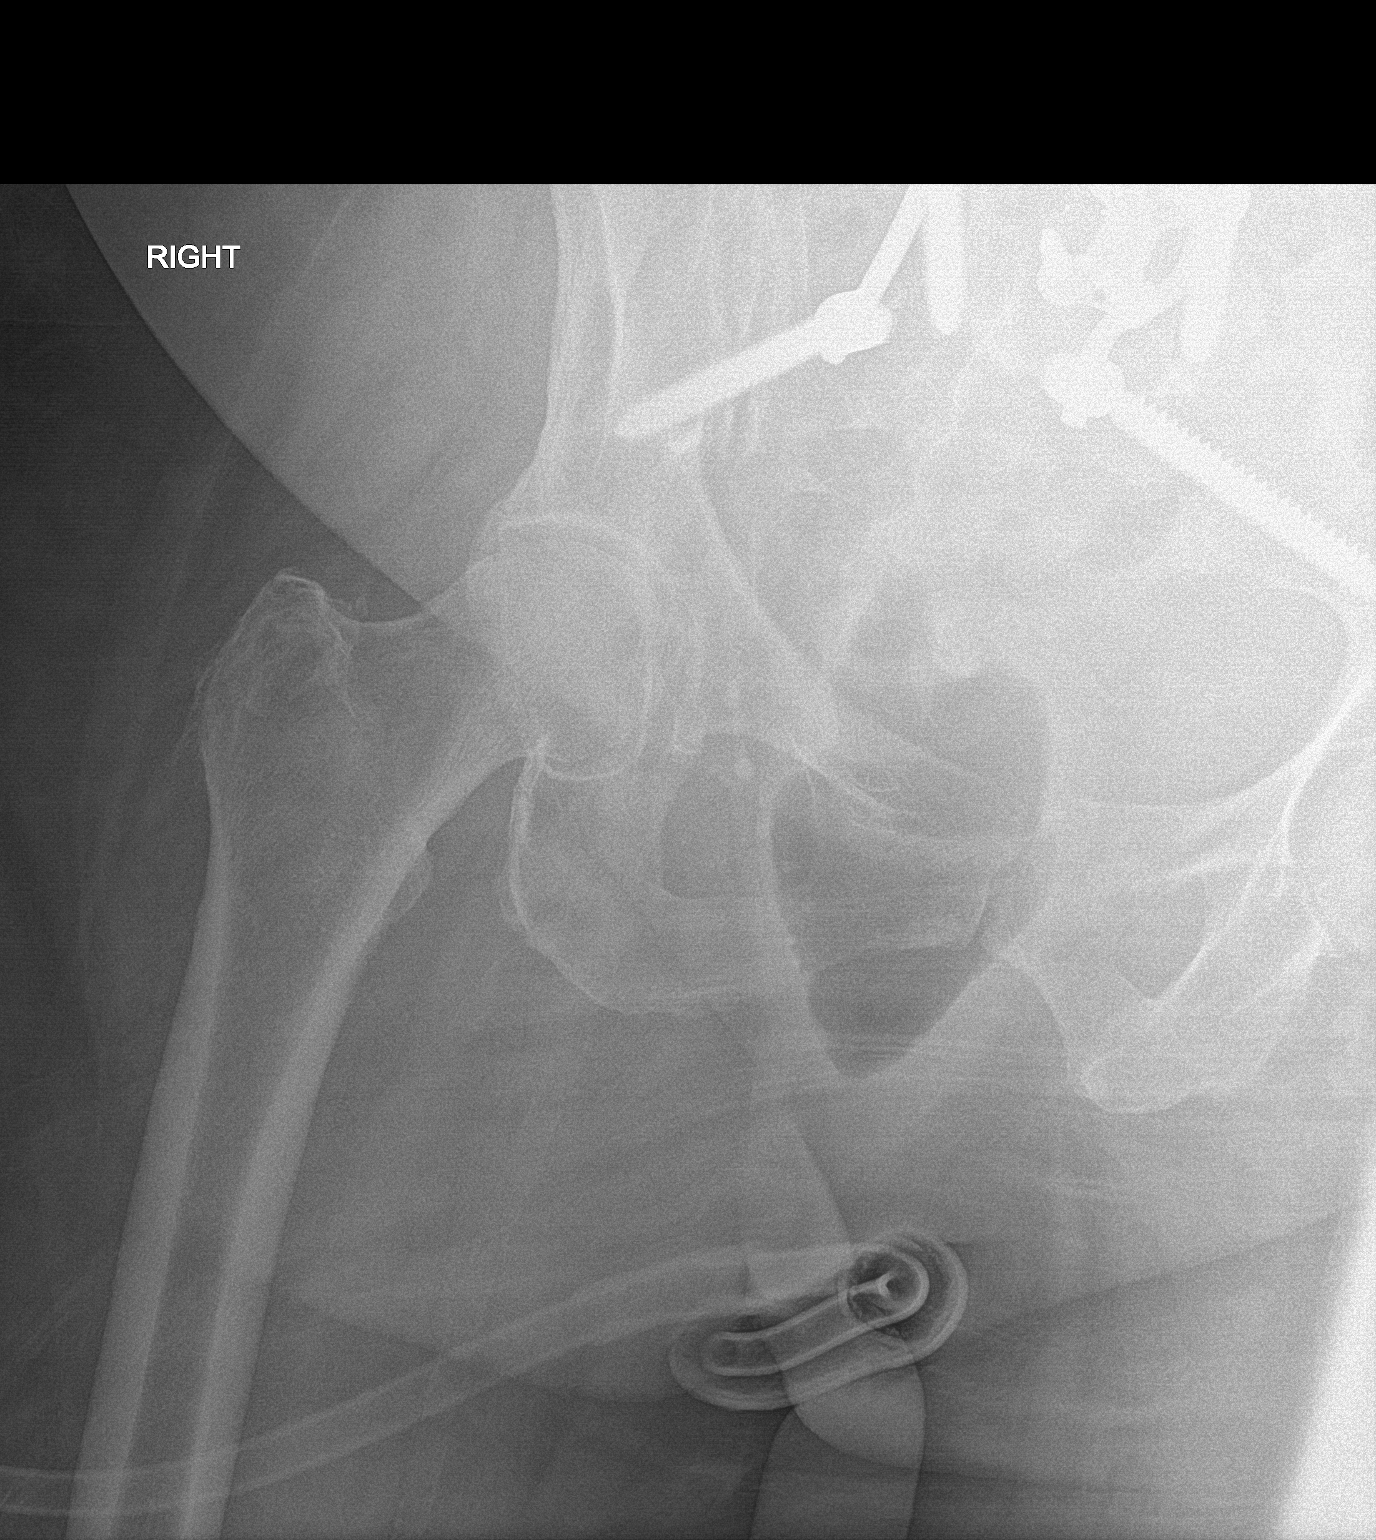

[2 of 2 positions shown; findings below may reference images not displayed]

FINDINGS: Postsurgical changes are noted in the lower lumbar spine extending
into the sacrum. The overall appearance is similar to that seen on
the intraoperative exam. Mild degenerative changes of the hip joints
are noted bilaterally. No acute fracture is seen.
IMPRESSION: Postsurgical changes stable from the previous intraoperative exam.
No acute abnormality noted.

## 2020-03-14 ENCOUNTER — Telehealth: Payer: Self-pay | Admitting: Cardiology

## 2020-03-14 NOTE — Telephone Encounter (Signed)
Pt c/o Shortness Of Breath: STAT if SOB developed within the last 24 hours or pt is noticeably SOB on the phone  1. Are you currently SOB (can you hear that pt is SOB on the phone)? Not at this time  2. How long have you been experiencing SOB? Several weeks  3. Are you SOB when sitting or when up moving around? Moving around  4. Are you currently experiencing any other symptoms?  Off balance at times- pt wanted an appt- made her one for tomorrow with Dr Servando Salina- please call to evaluate

## 2020-03-15 ENCOUNTER — Ambulatory Visit: Payer: PPO | Admitting: Cardiology

## 2020-03-15 ENCOUNTER — Encounter: Payer: Self-pay | Admitting: Cardiology

## 2020-03-15 ENCOUNTER — Other Ambulatory Visit: Payer: Self-pay

## 2020-03-15 VITALS — BP 120/68 | HR 72 | Ht 64.0 in | Wt 208.6 lb

## 2020-03-15 DIAGNOSIS — G4733 Obstructive sleep apnea (adult) (pediatric): Secondary | ICD-10-CM | POA: Diagnosis not present

## 2020-03-15 DIAGNOSIS — I2510 Atherosclerotic heart disease of native coronary artery without angina pectoris: Secondary | ICD-10-CM | POA: Diagnosis not present

## 2020-03-15 DIAGNOSIS — I10 Essential (primary) hypertension: Secondary | ICD-10-CM | POA: Diagnosis not present

## 2020-03-15 DIAGNOSIS — E669 Obesity, unspecified: Secondary | ICD-10-CM | POA: Diagnosis not present

## 2020-03-15 DIAGNOSIS — Z79899 Other long term (current) drug therapy: Secondary | ICD-10-CM

## 2020-03-15 MED ORDER — FUROSEMIDE 40 MG PO TABS: 40.0000 mg | ORAL_TABLET | ORAL | 3 refills | Status: DC

## 2020-03-15 MED ORDER — RANOLAZINE ER 500 MG PO TB12: 500.0000 mg | ORAL_TABLET | Freq: Two times a day (BID) | ORAL | 3 refills | Status: DC

## 2020-03-15 NOTE — Patient Instructions (Signed)
Medication Instructions:  1) Start, Ranolazine (Ranexa) 500 mg twice a day   2) Start, Furosemide (Lasix) 40 mg every other day   *If you need a refill on your cardiac medications before your next appointment, please call your pharmacy*   Lab Work: Bmp, Pro Bnp, Cbc, Magnesium- Today   If you have labs (blood work) drawn today and your tests are completely normal, you will receive your results only by: Marland Kitchen MyChart Message (if you have MyChart) OR . A paper copy in the mail If you have any lab test that is abnormal or we need to change your treatment, we will call you to review the results.   Testing/Procedures: None ordered    Follow-Up: At [facility], you and your health needs are our priority.  As part of our continuing mission to provide you with exceptional heart care, we have created designated Provider Care Teams.  These Care Teams include your primary Cardiologist (physician) and Advanced Practice Providers (APPs -  Physician Assistants and Nurse Practitioners) who all work together to provide you with the care you need, when you need it.  We recommend signing up for the patient portal called "MyChart".  Sign up information is provided on this After Visit Summary.  MyChart is used to connect with patients for Virtual Visits (Telemedicine).  Patients are able to view lab/test results, encounter notes, upcoming appointments, etc.  Non-urgent messages can be sent to your provider as well.   To learn more about what you can do with MyChart, go to [email or web address].    Your next appointment:   12 week(s)  The format for your next appointment:   In Person  Provider:   Thomasene Ripple, DO   Other Instructions None

## 2020-03-15 NOTE — Progress Notes (Signed)
Cardiology Office Note:    Date:  03/15/2020   ID:  Milanie, Rosenfield Jul 27, 1940, MRN [id number]  PCP:  Nicoletta Dress, MD  Cardiologist:  Berniece Salines, DO  Electrophysiologist:  None   Referring MD: Nicoletta Dress, MD   " I am having shortness of breath"  History of Present Illness:    Karen Castillo is a 80 y.o. female with a hx of of coronary artery disease on CTA, hypertension, hyperlipidemia and sleep apnea here today for a follow-up visit.  At the time of her visit in December 2021 she appears to be doing well from a cardiovascular standpoint.  She denied any symptoms.  Her medications were continued with no change.  Today she is here for follow-up visit.  She called yesterday reporting that she has had worsening shortness of breath on exertion.  She is concerned about this.  She tells me that recently she has not been able to walk across her house which is her normal routine and was able to do that prior.  No chest pain she tells me.  She is able to lie flat and denies any PND.  Past Medical History:  Diagnosis Date  . Abdominal pain   . Acute post-operative pain 07/13/2017  . Allergic rhinitis   . Anemia   . Arthritis   . Bipolar disorder ([place])    patient denies  . Cancer (HCC)    SKIN    ,    LEFT BREAST   . Chronic bilateral low back pain 09/16/2016  . Chronic constipation   . Chronic pain syndrome   . Constipation   . 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 Dry eyes   . Dysuria   . Elevated cholesterol   . 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 Gallstones   . GERD (gastroesophageal reflux disease)   . Glaucoma    both eyes  . Gout   . Hypertension   . IBS (irritable bowel syndrome)   . Idiopathic peripheral neuropathy 06/11/2015  . 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 Ingrown nail of great toe of right foot 05/04/2017  . Insomnia   . Intractable low back pain 07/13/2017  . Low back pain 08/27/2017  . Nausea without vomiting   . Obesity    . Pain syndrome, chronic 11/29/2015  . Paronychia of great toe, right 05/04/2017  . Postoperative anemia due to acute blood loss 08/29/2017  . 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 Radiculopathy 05/04/2014  . Radiculopathy of lumbar region 11/29/2015  . Risk for falls 06/11/2015  . Schizophrenia ([place])   . Sleep apnea    USES CPAP   . Sleep apnea, obstructive 04/03/2015  . Urticaria   . Vitamin deficiency     Past Surgical History:  Procedure Laterality Date  . ABDOMINAL EXPOSURE N/A 05/04/2014   Procedure: ABDOMINAL EXPOSURE;  Surgeon: Serafina Mitchell, MD;  Location: [facility];  Service: Vascular;  Laterality: N/A;  . ANTERIOR LUMBAR FUSION N/A 05/04/2014   Procedure: ANTERIOR LUMBAR FUSION 1 LEVEL;  Surgeon: Sinclair Ship, MD;  Location: [facility];  Service: Orthopedics;  Laterality: N/A;  Lumbar 5-sacrum 1 anterior lumbar interbody fusion with instrumentation and allograft  . ANTERIOR LUMBAR FUSION  07/2017  . ANTERIOR LUMBAR FUSION  08/2017  . BACK SURGERY     2010 ,2015LUMB FUSION, 06/2013 [facility] FUSION  . BACK SURGERY  07/2017   Lumbar  . BREAST BIOPSY  2013   pt said it  was positive   . BREAST SURGERY     LEFT BRREAST BX  . CARPAL TUNNEL RELEASE     1994 RT  . CARPAL TUNNEL RELEASE Bilateral   . CATARACT EXTRACTION, BILATERAL  2017  . CHOLECYSTECTOMY     2004  . COLONOSCOPY W/ BIOPSIES     Colonoscopy 3664,4034, 2009, 2011  . ERCP     2009  . ESOPHAGOGASTRODUODENOSCOPY  08/09/2017   Small hiatal hernia. Mild gastritis. Incidenta; duodenal diverticulum. Incidental gastric polyp.   Marland Kitchen LEEP     1992 and 1994  . SKIN CANCER EXCISION     X 4   . thorocolumbar fusion  06/2013  . TUBAL LIGATION      Current Medications: Current Meds  Medication Sig  . alendronate (FOSAMAX) 70 MG tablet Take 70 mg by mouth every Thursday. Take with a full glass of water on an empty stomach.  Marland Kitchen allopurinol (ZYLOPRIM) 100 MG tablet Take 100 mg by mouth daily.  Marland Kitchen amLODipine (NORVASC) 10  MG tablet Take 10 mg by mouth daily.  Marland Kitchen ascorbic acid (VITAMIN C) 500 MG tablet Take 1,000 mg by mouth daily.  Marland Kitchen aspirin EC 81 MG tablet Take 81 mg by mouth daily.  . benzonatate (TESSALON) 100 MG capsule Take 100-200 mg by mouth every 8 (eight) hours as needed.  . Biotin 10 MG TABS Take 10 mg by mouth daily.  . calcium carbonate (OSCAL) 1500 (600 Ca) MG TABS tablet Take 600 mg of elemental calcium by mouth 3 (three) times daily with meals.  . celecoxib (CELEBREX) 200 MG capsule Take 200 mg by mouth daily.  . Cholecalciferol (VITAMIN D3) 2000 units TABS Take 1 tablet by mouth daily.  . cloNIDine (CATAPRES) 0.1 MG tablet Take 0.1 mg by mouth at bedtime.  . cycloSPORINE (RESTASIS) 0.05 % ophthalmic emulsion Place 1 drop into both eyes 2 (two) times daily.   . fexofenadine (ALLEGRA) 180 MG tablet Take 180 mg by mouth daily.  . fluconazole (DIFLUCAN) 100 MG tablet Take Fluconazole 200 mg for 1 day. Then followed by Fluconazole 100 mg daily for 10 days  . furosemide (LASIX) 40 MG tablet Take 1 tablet (40 mg total) by mouth every other day.  . hydrochlorothiazide (HYDRODIURIL) 25 MG tablet Take 25 mg by mouth daily.  Marland Kitchen HYDROmorphone HCl (EXALGO) 8 MG TB24 Take 8 mg by mouth daily as needed.  . hydrOXYzine (ATARAX/VISTARIL) 25 MG tablet Take 25 mg by mouth daily.  . Lactobacillus (PROBIOTIC ACIDOPHILUS PO) Take 1 tablet by mouth daily.  . lansoprazole (PREVACID) 30 MG capsule Take 1 capsule (30 mg total) by mouth daily before breakfast. 30 minutes before breakfast  . latanoprost (XALATAN) 0.005 % ophthalmic solution Place 1 drop into both eyes at bedtime.  Marland Kitchen linaclotide (LINZESS) 290 MCG CAPS capsule Take 1 capsule (290 mcg total) by mouth daily before breakfast.  . LIVALO 2 MG TABS Take 1 tablet by mouth at bedtime.  . Magnesium 250 MG TABS Take 250 mg by mouth daily.  . methocarbamol (ROBAXIN) 750 MG tablet Take 750 mg by mouth 3 (three) times daily.  . nadolol (CORGARD) 40 MG tablet Take 60 mg by  mouth daily.  Marland Kitchen NARCAN 4 MG/0.1ML LIQD nasal spray kit Place 4 mg into the nose. I spray in both nares PRN  . nitroGLYCERIN (NITROSTAT) 0.4 MG SL tablet Place 1 tablet (0.4 mg total) under the tongue every 5 (five) minutes as needed for chest pain.  . Omega-3 Fatty Acids (FISH  OIL) 600 MG CAPS Take 600 mg by mouth 2 (two) times daily.  Marland Kitchen oxybutynin (DITROPAN) 5 MG tablet Take 5 mg by mouth 2 (two) times daily.  . polyethylene glycol (MIRALAX / GLYCOLAX) 17 g packet Take 17 g by mouth daily as needed for mild constipation.  . ranolazine (RANEXA) 500 MG 12 hr tablet Take 1 tablet (500 mg total) by mouth 2 (two) times daily.  Marland Kitchen spironolactone (ALDACTONE) 25 MG tablet Take 25 mg by mouth daily.  . sucralfate (CARAFATE) 1 GM/10ML suspension Take 10 mLs (1 g total) by mouth 4 (four) times daily -  with meals and at bedtime.  . timolol (BETIMOL) 0.5 % ophthalmic solution Place 1 drop into both eyes daily. In the morning  . timolol (TIMOPTIC) 0.5 % ophthalmic solution SMARTSIG:In Eye(s)  . trazodone (DESYREL) 300 MG tablet Take 300 mg by mouth at bedtime.  Marland Kitchen trimethoprim (TRIMPEX) 100 MG tablet Take 100 mg by mouth daily.  . valsartan (DIOVAN) 80 MG tablet Take 80 mg by mouth daily.  . Wheat Dextrin (BENEFIBER) POWD Take 1 packet by mouth daily. Clear and Sugar FREE. 1 tablespoon daily     Allergies:   Ciprofloxacin, Cyclobenzaprine, Pregabalin, Fluvastatin, Meloxicam, Metaxalone, Pramipexole, Pravastatin, Rosuvastatin, Sertraline, Statins, Other, Pravachol [pravastatin sodium], Amitriptyline, Arthrotec [diclofenac-misoprostol], Augmentin [amoxicillin-pot clavulanate], Bromfenac, Cephalexin, Conj estrog-medroxyprogest ace, Diclofenac-misoprostol, Erythromycin, Esomeprazole, Esomeprazole magnesium, Levofloxacin, Methadone, Metoclopramide, Oxycodone, Propoxyphene, Rofecoxib, Sulfur, Tramadol, and Zoloft [sertraline hcl]   Social History   Socioeconomic History  . Marital status: Widowed    Spouse name:  Not on file  . Number of children: 2  . Years of education: Not on file  . Highest education level: Not on file  Occupational History  . Occupation: Retired  Tobacco Use  . Smoking status: Never Smoker  . Smokeless tobacco: Never Used  Vaping Use  . Vaping Use: Never used  Substance and Sexual Activity  . Alcohol use: No  . Drug use: No  . Sexual activity: Not on file  Other Topics Concern  . Not on file  Social History Narrative  . Not on file   Social Determinants of Health   Financial Resource Strain: Not on file  Food Insecurity: Not on file  Transportation Needs: Not on file  Physical Activity: Not on file  Stress: Not on file  Social Connections: Not on file     Family History: The patient's family history includes Diabetes in her mother; Prostate cancer in her father; Stroke in her sister. There is no history of Colon cancer, Esophageal cancer, Stomach cancer, or Rectal cancer.  ROS:   Review of Systems  Constitution: Negative for decreased appetite, fever and weight gain.  HENT: Negative for congestion, ear discharge, hoarse voice and sore throat.   Eyes: Negative for discharge, redness, vision loss in right eye and visual halos.  Cardiovascular: Negative for chest pain, dyspnea on exertion, leg swelling, orthopnea and palpitations.  Respiratory: Negative for cough, hemoptysis, shortness of breath and snoring.   Endocrine: Negative for heat intolerance and polyphagia.  Hematologic/Lymphatic: Negative for bleeding problem. Does not bruise/bleed easily.  Skin: Negative for flushing, nail changes, rash and suspicious lesions.  Musculoskeletal: Negative for arthritis, joint pain, muscle cramps, myalgias, neck pain and stiffness.  Gastrointestinal: Negative for abdominal pain, bowel incontinence, diarrhea and excessive appetite.  Genitourinary: Negative for decreased libido, genital sores and incomplete emptying.  Neurological: Negative for brief paralysis, focal  weakness, headaches and loss of balance.  Psychiatric/Behavioral: Negative for altered mental status,  depression and suicidal ideas.  Allergic/Immunologic: Negative for HIV exposure and persistent infections.    EKGs/Labs/Other Studies Reviewed:    The following studies were reviewed today:   EKG:  The ekg ordered today demonstrates   TTE IMPRESSIONS  1. Left ventricular ejection fraction, by visual estimation, is 65 to 70%.  The left ventricle has normal function. There is mildly increased left ventricular hypertrophy.  2. Global right ventricle has normal systolic function.The right  ventricular size is normal. No increase in right ventricular wall  thickness.  3. Left atrial size was upper normal.  4. Right atrial size was normal.  5. Mild aortic valve annular calcification.  6. Moderate mitral annular calcification.  7. The mitral valve is degenerative. Mild mitral valve regurgitation.  8. The tricuspid valve is grossly normal. Tricuspid valve regurgitation is trivial.  9. The aortic valve is tricuspid. Aortic valve regurgitation is not visualized.  10. The pulmonic valve was not well visualized. Pulmonic valve regurgitation is trivial.  11. TR signal is inadequate for assessing pulmonary artery systolic pressure.   FFRct results FINDINGS: All values in normal range Specifically RCA 0.98 LAD 0.90 and Circumflex 0.97  IMPRESSION: Negative FFR CT   CCTA   Calcium Score: LM and 3 vessel calcium noted  Coronary Arteries: Right dominant with no anomalies  LM: 1-24% calcified ostial plaque  LAD: 50-69% long calcified plaque proximally 25-49% mixed plaque in mid vessel and 1-24% calcified plaque distally  D1: Normal  D2: Normal  Circumflex: 1-24% mixed plaque in mid vessel involving OM2  OM1: Normal high take off  RCA: 1-24% calcified plaque proximally 25-49% calcified plaque mid vessel 1-24% calcified plaque distally  PDA: Normal  PLA:  1-24% soft plaque  IMPRESSION: 1. Normal aortic root 3.2 cm with moderate calcific atherosclerosis  2. Calcium score 1213 involving LM and 3 vessels this is 95 th percentile for age and sex  54. CAD see description above study sent for FFR CT to r/o obstructive disease in mid RCA and proximal LAD  [place]   Recent Labs: 07/21/2019: ALT 15; BUN 10; Creatinine, Ser 0.81; Hemoglobin 12.0; Platelets 274; Potassium 3.7; Sodium 133  Recent Lipid Panel No results found for: CHOL, TRIG, HDL, CHOLHDL, VLDL, LDLCALC, LDLDIRECT  Physical Exam:    VS:  BP 120/68   Pulse 72   Ht _0  (1.626 m)   Wt 208 lb 9.6 oz (94.6 kg)   SpO2 94%   BMI 35.81 kg/m     Wt Readings from Last 3 Encounters:  03/15/20 208 lb 9.6 oz (94.6 kg)  02/13/20 210 lb 12.8 oz (95.6 kg)  09/07/19 204 lb (92.5 kg)     GEN: Well nourished, well developed in no acute distress HEENT: Normal NECK: No JVD; No carotid bruits LYMPHATICS: No lymphadenopathy CARDIAC: S1S2 noted,RRR, no murmurs, rubs, gallops RESPIRATORY:  Clear to auscultation without rales, wheezing or rhonchi  ABDOMEN: Soft, non-tender, non-distended, +bowel sounds, no guarding. EXTREMITIES: No edema, No cyanosis, no clubbing MUSCULOSKELETAL:  No deformity  SKIN: Warm and dry NEUROLOGIC:  Alert and oriented x 3, non-focal PSYCHIATRIC:  Normal affect, good insight  ASSESSMENT:    1. Medication management   2. Primary hypertension   3. Coronary artery disease involving native coronary artery of native heart without angina pectoris   4. Obstructive sleep apnea syndrome   5. Obesity (BMI 30.0-34.9)    PLAN:     She is short of breath which could be multifactorial would like to start  with his understanding if a coronary artery disease is playing a role so I started patient on Ranexa 500 mg twice a day.  She also does have diastolic dysfunction and has bilateral leg edema despite being on hydrochlorothiazide and spironolactone as well as  mild JVD.  I am going to add Lasix 40 mg every other day to see if this is going to help.  I plan to get blood work today as well as see the patient back in 2 weeks.  We will get an echocardiogram to assess to see if her mitral valve disease also is progressing.  Continue with CPAP.  Very difficult for the patient to exercise recently given the fact that she is short of breath on exertion  Continue with Pitavastatin  The patient is in agreement with the above plan. The patient left the office in stable condition.  The patient will follow up in   Medication Adjustments/Labs and Tests Ordered: Current medicines are reviewed at length with the patient today.  Concerns regarding medicines are outlined above.  Orders Placed This Encounter  Procedures  . Basic metabolic panel  . Magnesium  . Pro b natriuretic peptide (BNP)  . CBC   Meds ordered this encounter  Medications  . furosemide (LASIX) 40 MG tablet    Sig: Take 1 tablet (40 mg total) by mouth every other day.    Dispense:  16 tablet    Refill:  3  . ranolazine (RANEXA) 500 MG 12 hr tablet    Sig: Take 1 tablet (500 mg total) by mouth 2 (two) times daily.    Dispense:  180 tablet    Refill:  3    Patient Instructions  Medication Instructions:  1) Start, Ranolazine (Ranexa) 500 mg twice a day   2) Start, Furosemide (Lasix) 40 mg every other day   *If you need a refill on your cardiac medications before your next appointment, please call your pharmacy*   Lab Work: Bmp, Pro Bnp, Cbc, Magnesium- Today   If you have labs (blood work) drawn today and your tests are completely normal, you will receive your results only by: Marland Kitchen MyChart Message (if you have MyChart) OR . A paper copy in the mail If you have any lab test that is abnormal or we need to change your treatment, we will call you to review the results.   Testing/Procedures: None ordered    Follow-Up: At [facility], you and your health needs are our  priority.  As part of our continuing mission to provide you with exceptional heart care, we have created designated Provider Care Teams.  These Care Teams include your primary Cardiologist (physician) and Advanced Practice Providers (APPs -  Physician Assistants and Nurse Practitioners) who all work together to provide you with the care you need, when you need it.  We recommend signing up for the patient portal called "MyChart".  Sign up information is provided on this After Visit Summary.  MyChart is used to connect with patients for Virtual Visits (Telemedicine).  Patients are able to view lab/test results, encounter notes, upcoming appointments, etc.  Non-urgent messages can be sent to your provider as well.   To learn more about what you can do with MyChart, go to [email or web address].    Your next appointment:   12 week(s)  The format for your next appointment:   In Person  Provider:   Berniece Salines, DO   Other Instructions None      Adopting a Healthy Lifestyle.  Know what a healthy weight is for you (roughly BMI <25) and aim to maintain this   Aim for 7+ servings of fruits and vegetables daily   65-80+ fluid ounces of water or unsweet tea for healthy kidneys   Limit to max 1 drink of alcohol per day; avoid smoking/tobacco   Limit animal fats in diet for cholesterol and heart health - choose grass fed whenever available   Avoid highly processed foods, and foods high in saturated/trans fats   Aim for low stress - take time to unwind and care for your mental health   Aim for 150 min of moderate intensity exercise weekly for heart health, and weights twice weekly for bone health   Aim for 7-9 hours of sleep daily   When it comes to diets, agreement about the perfect plan isnt easy to find, even among the experts. Experts at the [employer] developed an idea known as the Healthy Eating Plate. Just imagine a plate divided into logical, healthy  portions.   The emphasis is on diet quality:   Load up on vegetables and fruits - one-half of your plate: Aim for color and variety, and remember that potatoes dont count.   Go for whole grains - one-quarter of your plate: Whole wheat, barley, wheat berries, quinoa, oats, brown rice, and foods made with them. If you want pasta, go with whole wheat pasta.   Protein power - one-quarter of your plate: Fish, chicken, beans, and nuts are all healthy, versatile protein sources. Limit red meat.   The diet, however, does go beyond the plate, offering a few other suggestions.   Use healthy plant oils, such as olive, canola, soy, corn, sunflower and peanut. Check the labels, and avoid partially hydrogenated oil, which have unhealthy trans fats.   If youre thirsty, drink water. Coffee and tea are good in moderation, but skip sugary drinks and limit milk and dairy products to one or two daily servings.   The type of carbohydrate in the diet is more important than the amount. Some sources of carbohydrates, such as vegetables, fruits, whole grains, and beans-are healthier than others.   Finally, stay active  Signed, Berniece Salines, DO  03/15/2020 10:32 AM    [place]

## 2020-03-16 ENCOUNTER — Telehealth: Payer: Self-pay

## 2020-03-16 LAB — BASIC METABOLIC PANEL
BUN/Creatinine Ratio: 15 (ref 12–28)
BUN: 16 mg/dL (ref 8–27)
CO2: 23 mmol/L (ref 20–29)
Calcium: 9.7 mg/dL (ref 8.7–10.3)
Chloride: 98 mmol/L (ref 96–106)
Creatinine, Ser: 1.1 mg/dL — ABNORMAL HIGH (ref 0.57–1.00)
GFR calc Af Amer: 56 mL/min/{1.73_m2} — ABNORMAL LOW (ref >59)
GFR calc non Af Amer: 48 mL/min/{1.73_m2} — ABNORMAL LOW (ref >59)
Glucose: 117 mg/dL — ABNORMAL HIGH (ref 65–99)
Potassium: 4.7 mmol/L (ref 3.5–5.2)
Sodium: 135 mmol/L (ref 134–144)

## 2020-03-16 LAB — CBC
Hematocrit: 37.9 % (ref 34.0–46.6)
Hemoglobin: 12.6 g/dL (ref 11.1–15.9)
MCH: 29.7 pg (ref 26.6–33.0)
MCHC: 33.2 g/dL (ref 31.5–35.7)
MCV: 89 fL (ref 79–97)
Platelets: 266 10*3/uL (ref 150–450)
RBC: 4.24 x10E6/uL (ref 3.77–5.28)
RDW: 11.9 % (ref 11.7–15.4)
WBC: 8.6 10*3/uL (ref 3.4–10.8)

## 2020-03-16 LAB — PRO B NATRIURETIC PEPTIDE: NT-Pro BNP: 408 pg/mL (ref 0–738)

## 2020-03-16 LAB — MAGNESIUM: Magnesium: 2.2 mg/dL (ref 1.6–2.3)

## 2020-03-16 NOTE — Telephone Encounter (Signed)
Patient notified of results and verbalized understanding.  

## 2020-03-16 NOTE — Telephone Encounter (Signed)
-----   Message from Berniece Salines, DO sent at 03/16/2020 12:17 PM EST ----- Creatinine slightly elevated we will continue to monitor this.  It was 0.81 7 months ago today is 1.1

## 2020-03-23 DIAGNOSIS — Z1231 Encounter for screening mammogram for malignant neoplasm of breast: Secondary | ICD-10-CM | POA: Diagnosis not present

## 2020-03-26 ENCOUNTER — Encounter: Payer: Self-pay | Admitting: Oncology

## 2020-03-28 ENCOUNTER — Telehealth: Payer: Self-pay

## 2020-03-28 NOTE — Progress Notes (Signed)
[facility]  [address] [place],  [place]  [address] [phone]  Clinic Day:  03/29/2020  Referring physician: Nicoletta Dress, MD   This document serves as a record of services personally performed by Hosie Poisson, MD. It was created on their behalf by [facility], a trained medical scribe. The creation of this record is based on the scribe's personal observations and the provider's statements to them.   CHIEF COMPLAINT:  CC: History of stage IA hormone receptor positive left breast cancer  Current Treatment:  Surveillance   HISTORY OF PRESENT ILLNESS:  Karen Castillo is a 80 y.o. 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  Biopsy revealed an infiltrating ductal carcinoma, tubular type.  Estrogen and progesterone receptors were positive and her 2 Neu negative.  She underwent excisional biopsy, but not a true lumpectomy in February 2013, and never had re-excision of the margins.  Pathology revealed a 5 mm, grade 1, lesion with 1 mm margin.  Re-excision and sentinel node biopsy was recommended, however she declined further surgery.  She was placed on letrozole 2.5 mg daily, however, was unable to tolerate this.  She is intolerant to many medications.  Bilateral diagnostic mammogram in December 2015 revealed postoperative scarring the left breast, as well as a new 3 mm benign appearing nodule in the right upper outer quadrant.  She had a repeat diagnostic right mammogram in July 2016, which was stable.  Bilateral diagnostic mammogram in November did not reveal any evidence of malignancy and follow-up in 1 year was recommended.  She had osteopenia on her last DEXA in in July 2016, which revealed a T-score of -1.8 in the femur, for which she is taking calcium and vitamin-D.  She had back surgery in February 2016, which did help her back pain.  She is here for routine  follow-up and states she has been doing fairly well.  She denies any changes in her breasts. She has persistent pain in her back and legs which she rates 7/10 today.  She states she also has labs done at Dr. Manon Hilding office, and we will request those.  She had her last mammogram on January 14, 2016.  She saw Dr. Melina Copa for Gastroenterology as she was having a lot of nausea and vomiting and inability to eat.  Upper endoscopy was okay and her last colonoscopy was in 2000. 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  She is doing better with that now, and did also have an ultrasound of the abdomen, which was negative.  She had told me that she had a chest mass 6-7 years ago which was evaluated at [facility] in [facility].  She later had a PET scan which was negative but has not had a repeat CT of the chest in years.  I ordered that to be done in June to follow up on this chest mass.  The CT scan confirmed a cyst along the right cardiophrenic angle measuring 3.6 cm, which is the same measurement as the previous report.  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    INTERVAL HISTORY:  Karen Castillo is here for routine follow up and states that she has been well, other than back problems.  She has occasional back pain, and has to  walk "hunched over".  She does note some balance instability.  Annual bilateral mammogram from January was clear.  She does states that she has some difficulty with mammography.  Her  appetite is good, and she has lost 3 and 1/2 pounds since her last visit.  She denies fever, chills or other signs of infection.  She denies nausea, vomiting, bowel issues, or abdominal pain.  She denies sore throat, cough, dyspnea, or chest pain.  REVIEW OF SYSTEMS:  Review of Systems  Constitutional: Negative.   HENT:  Negative.   Eyes: Negative.   Respiratory: Negative.    Cardiovascular: Negative.   Gastrointestinal: Negative.   Endocrine: Negative.   Genitourinary: Negative.    Musculoskeletal: Positive for back pain (occasional).  Skin: Negative.   Neurological:       Balance instability  Hematological: Negative.   Psychiatric/Behavioral: Negative.      VITALS:  Blood pressure 138/62, pulse 68, temperature 98 F (36.7 C), temperature source Oral, resp. rate 18, height _0  (1.626 m), weight 202 lb 11.2 oz (91.9 kg), SpO2 94 %.  Wt Readings from Last 3 Encounters:  03/29/20 202 lb 11.2 oz (91.9 kg)  03/15/20 208 lb 9.6 oz (94.6 kg)  02/13/20 210 lb 12.8 oz (95.6 kg)    Body mass index is 34.79 kg/m.  Performance status (ECOG): 1 - Symptomatic but completely ambulatory  PHYSICAL EXAM:  Physical Exam Constitutional:      General: She is not in acute distress.    Appearance: Normal appearance. She is normal weight.  HENT:     Head: Normocephalic and atraumatic.  Eyes:     General: No scleral icterus.    Extraocular Movements: Extraocular movements intact.     Conjunctiva/sclera: Conjunctivae normal.     Pupils: Pupils are equal, round, and reactive to light.  Cardiovascular:     Rate and Rhythm: Normal rate and regular rhythm.     Pulses: Normal pulses.     Heart sounds: Normal heart sounds. No murmur heard. No friction rub. No gallop.   Pulmonary:     Effort: Pulmonary effort is normal. No respiratory distress.     Breath sounds: Normal breath sounds.  Chest:  Breasts:     Right: Normal.     Left: Normal.      Comments: Bilateral breasts are without masses.  She has a well healed scar at 1 o'clock in the left breast with no evidence of recurrence. Abdominal:     General: Bowel sounds are normal. There is no distension.     Palpations: Abdomen is soft. There is no mass.     Tenderness: There is no abdominal tenderness.  Musculoskeletal:        General: Normal range of motion.     Cervical back: Normal range of motion and neck  supple.     Right lower leg: No edema.     Left lower leg: No edema.  Lymphadenopathy:     Cervical: No cervical adenopathy.  Skin:    General: Skin is warm and dry.  Neurological:     General: No focal deficit present.     Mental Status: She is alert and oriented to person, place, and time. Mental status is at baseline.  Psychiatric:        Mood and Affect: Mood normal.        Behavior: Behavior normal.        Thought Content: Thought content normal.        Judgment:  Judgment normal.     LABS:   CBC Latest Ref Rng & Units 03/15/2020 07/21/2019 01/17/2019  WBC 3.4 - 10.8 x10E3/uL 8.6 6.6 8.1  Hemoglobin 11.1 - 15.9 g/dL 12.6 12.0 11.9(L)  Hematocrit 34.0 - 46.6 % 37.9 37.1 36.2  Platelets 150 - 450 x10E3/uL 266 274 245   CMP Latest Ref Rng & Units 03/15/2020 07/21/2019 01/17/2019  Glucose 65 - 99 mg/dL 117(H) 111(H) 97  BUN 8 - 27 mg/dL _0 Creatinine 0.57 - 1.00 mg/dL 1.10(H) 0.81 0.74  Sodium 134 - 144 mmol/L 135 133(L) 137  Potassium 3.5 - 5.2 mmol/L 4.7 3.7 3.9  Chloride 96 - 106 mmol/L 98 95(L) 103  CO2 20 - 29 mmol/L _1 Calcium 8.7 - 10.3 mg/dL 9.7 9.6 8.7(L)  Total Protein 6.5 - 8.1 g/dL - 7.0 6.5  Total Bilirubin 0.3 - 1.2 mg/dL - 0.8 0.6  Alkaline Phos 38 - 126 U/L - 66 82  AST 15 - 41 U/L - 18 32  ALT 0 - 44 U/L - 15 44    STUDIES:   She underwent digital screening bilateral mammogram with tomography on 03/23/2020 showing: breast density category B.  No mammographic evidence of malignancy.  Allergies:  Allergies  Allergen Reactions  . Ciprofloxacin Swelling    Tongue swells  . Cyclobenzaprine Swelling    Feet swell   . Pregabalin Anaphylaxis, Shortness Of Breath, Swelling and Other (See Comments)    Dizziness also  . Fluvastatin Other (See Comments)    Leg pain   . Meloxicam Swelling    Feet became swollen  . Metaxalone Rash and Other (See Comments)    Flushing of the skin and sore throat, too  . Pramipexole Nausea And Vomiting  .  Pravastatin Other (See Comments)    Leg pain  . Rosuvastatin Other (See Comments)    Leg pain  . Sertraline Other (See Comments)    Sensitive teeth  . Statins Other (See Comments)    Leg pain     . Other     Darvocet  . Pravachol [Pravastatin Sodium]     Upper leg pain  . Amitriptyline Other (See Comments)    Weird feeling all over  . Arthrotec [Diclofenac-Misoprostol] Diarrhea  . Augmentin [Amoxicillin-Pot Clavulanate] Diarrhea    Has patient had a PCN reaction causing immediate rash, facial/tongue/throat swelling, SOB or lightheadedness with hypotension: No Has patient had a PCN reaction causing severe rash involving mucus membranes or skin necrosis: No Has patient had a PCN reaction that required hospitalization: No Has patient had a PCN reaction occurring within the last 10 years: No If all of the above answers are "NO", then may proceed with Cephalosporin use.   . Bromfenac Diarrhea and Nausea Only  . Cephalexin Diarrhea and Nausea Only  . Conj Estrog-Medroxyprogest Ace Other (See Comments)    Unsure of reaction type Couldn't tolerate  . Diclofenac-Misoprostol Nausea Only and Diarrhea  . Elemental Sulfur Diarrhea and Nausea Only  . Erythromycin Diarrhea and Nausea Only  . Esomeprazole Diarrhea  . Esomeprazole Magnesium Nausea Only  . Levofloxacin Other (See Comments) and Nausea Only    Stomach upset  . Methadone Diarrhea  . Metoclopramide Diarrhea and Nausea Only  . Oxycodone Hives, Swelling and Rash  . Propoxyphene Diarrhea and Nausea Only  . Rofecoxib Diarrhea and Nausea Only  . Tramadol Diarrhea  . Zoloft [Sertraline Hcl] Other (See Comments)    Sensitivity with teeth    Current Medications:  Current Outpatient Medications  Medication Sig Dispense Refill  . alendronate (FOSAMAX) 70 MG tablet Take 70 mg by mouth every Thursday. Take with a full glass of water on an empty stomach.    Marland Kitchen allopurinol (ZYLOPRIM) 100 MG tablet Take 100 mg by mouth daily.    Marland Kitchen  amLODipine (NORVASC) 10 MG tablet Take 10 mg by mouth daily.    Marland Kitchen ascorbic acid (VITAMIN C) 500 MG tablet Take 1,000 mg by mouth daily.    Marland Kitchen aspirin EC 81 MG tablet Take 81 mg by mouth daily.    . benzonatate (TESSALON) 100 MG capsule Take 100-200 mg by mouth every 8 (eight) hours as needed.    . Biotin 10 MG TABS Take 10 mg by mouth daily.    . calcium carbonate (OSCAL) 1500 (600 Ca) MG TABS tablet Take 600 mg of elemental calcium by mouth 3 (three) times daily with meals.    . celecoxib (CELEBREX) 200 MG capsule Take 200 mg by mouth daily.    . Cholecalciferol (VITAMIN D3) 2000 units TABS Take 1 tablet by mouth daily.    . cloNIDine (CATAPRES) 0.1 MG tablet Take 0.1 mg by mouth at bedtime.    . cycloSPORINE (RESTASIS) 0.05 % ophthalmic emulsion Place 1 drop into both eyes 2 (two) times daily.     . fexofenadine (ALLEGRA) 180 MG tablet Take 180 mg by mouth daily.    . furosemide (LASIX) 40 MG tablet Take 1 tablet (40 mg total) by mouth every other day. 16 tablet 3  . hydrochlorothiazide (HYDRODIURIL) 25 MG tablet Take 25 mg by mouth daily.    Marland Kitchen HYDROmorphone (DILAUDID) 4 MG tablet Take by mouth. Twice a day as needed per patient    . HYDROmorphone HCl (EXALGO) 8 MG TB24 Take 8 mg by mouth daily as needed.    . hydrOXYzine (ATARAX/VISTARIL) 25 MG tablet Take 25 mg by mouth daily.    . Lactobacillus (PROBIOTIC ACIDOPHILUS PO) Take 1 tablet by mouth daily.    . lansoprazole (PREVACID) 30 MG capsule Take 1 capsule (30 mg total) by mouth daily before breakfast. 30 minutes before breakfast 30 capsule 0  . latanoprost (XALATAN) 0.005 % ophthalmic solution Place 1 drop into both eyes at bedtime.    Marland Kitchen linaclotide (LINZESS) 290 MCG CAPS capsule Take 1 capsule (290 mcg total) by mouth daily before breakfast. 30 capsule 11  . LIVALO 2 MG TABS Take 1 tablet by mouth at bedtime.    . Magnesium 250 MG TABS Take 250 mg by mouth daily.    . methocarbamol (ROBAXIN) 750 MG tablet Take 750 mg by mouth 3 (three)  times daily.    . nadolol (CORGARD) 40 MG tablet Take 60 mg by mouth daily.    Marland Kitchen NARCAN 4 MG/0.1ML LIQD nasal spray kit Place 4 mg into the nose. I spray in both nares PRN    . nitroGLYCERIN (NITROSTAT) 0.4 MG SL tablet Place 1 tablet (0.4 mg total) under the tongue every 5 (five) minutes as needed for chest pain. 30 tablet 0  . Omega-3 Fatty Acids (FISH OIL) 600 MG CAPS Take 600 mg by mouth 2 (two) times daily.    Marland Kitchen oxybutynin (DITROPAN) 5 MG tablet Take 5 mg by mouth 2 (two) times daily.    . polyethylene glycol (MIRALAX / GLYCOLAX) 17 g packet Take 17 g by mouth daily as needed for mild constipation.    . ranolazine (RANEXA) 500 MG 12 hr tablet Take 1 tablet (500 mg  total) by mouth 2 (two) times daily. 180 tablet 3  . spironolactone (ALDACTONE) 25 MG tablet Take 25 mg by mouth daily.    . timolol (BETIMOL) 0.5 % ophthalmic solution Place 1 drop into both eyes daily. In the morning    . trazodone (DESYREL) 300 MG tablet Take 300 mg by mouth at bedtime.    Marland Kitchen trimethoprim (TRIMPEX) 100 MG tablet Take 100 mg by mouth daily.    . valsartan (DIOVAN) 80 MG tablet Take 80 mg by mouth daily.    . Wheat Dextrin (BENEFIBER) POWD Take 1 packet by mouth daily. Clear and Sugar FREE. 1 tablespoon daily     No current facility-administered medications for this visit.     ASSESSMENT & PLAN:   Assessment:   1. Stage IA left breast cancer diagnosed in October 2012,  over 9 years postop with no evidence of disease despite the fact that she had declined any further treatment after her excisional biopsy.  2. Osteopenia.  She will continue calcium 1200 mg daily and vitamin D3 2000 units daily.   3. Chronic degenerative disc disease and chronic back pain.  She has had multiple(6) surgeries.  Her last 2 surgeries were May 2nd and June 20th 2019.  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  Plan: I can see her back in 1 year with bilateral mammogram.  She understands and agrees with  this plan of care.   I provided 15 minutes of face-to-face time during this this encounter and > 50% was spent counseling as documented under my assessment and plan.    Derwood Kaplan, MD [facility] [address] [place] [address] Dept: [phone] Dept Fax: [phone]   I, Rita Ohara, am acting as scribe for Derwood Kaplan, MD  I have reviewed this report as typed by the medical scribe, and it is complete and accurate.

## 2020-03-28 NOTE — Telephone Encounter (Signed)
Patient notified of mammo results 

## 2020-03-28 NOTE — Telephone Encounter (Signed)
-----   Message from Derwood Kaplan, MD sent at 03/26/2020  5:25 PM EST ----- Regarding: call pt Tell her mammo is clear

## 2020-03-29 ENCOUNTER — Inpatient Hospital Stay: Payer: PPO | Attending: Oncology | Admitting: Oncology

## 2020-03-29 ENCOUNTER — Telehealth: Payer: Self-pay | Admitting: Oncology

## 2020-03-29 ENCOUNTER — Other Ambulatory Visit: Payer: Self-pay

## 2020-03-29 ENCOUNTER — Other Ambulatory Visit: Payer: Self-pay | Admitting: Oncology

## 2020-03-29 ENCOUNTER — Encounter: Payer: Self-pay | Admitting: Oncology

## 2020-03-29 VITALS — BP 138/62 | HR 68 | Temp 98.0°F | Resp 18 | Ht 64.0 in | Wt 202.7 lb

## 2020-03-29 DIAGNOSIS — C50912 Malignant neoplasm of unspecified site of left female breast: Secondary | ICD-10-CM

## 2020-03-29 DIAGNOSIS — Z170 Estrogen receptor positive status [ER+]: Secondary | ICD-10-CM

## 2020-03-29 NOTE — Telephone Encounter (Signed)
Per 03/29/20 los next appt sched and given to patient 

## 2020-04-03 DIAGNOSIS — G4700 Insomnia, unspecified: Secondary | ICD-10-CM | POA: Diagnosis not present

## 2020-04-03 DIAGNOSIS — M545 Low back pain, unspecified: Secondary | ICD-10-CM | POA: Diagnosis not present

## 2020-04-03 DIAGNOSIS — J029 Acute pharyngitis, unspecified: Secondary | ICD-10-CM | POA: Diagnosis not present

## 2020-04-03 DIAGNOSIS — M546 Pain in thoracic spine: Secondary | ICD-10-CM | POA: Diagnosis not present

## 2020-04-09 ENCOUNTER — Encounter: Payer: Self-pay | Admitting: Oncology

## 2020-04-11 DIAGNOSIS — Z20822 Contact with and (suspected) exposure to covid-19: Secondary | ICD-10-CM | POA: Diagnosis not present

## 2020-04-11 DIAGNOSIS — R6889 Other general symptoms and signs: Secondary | ICD-10-CM | POA: Diagnosis not present

## 2020-04-25 DIAGNOSIS — M546 Pain in thoracic spine: Secondary | ICD-10-CM | POA: Diagnosis not present

## 2020-04-25 DIAGNOSIS — G4700 Insomnia, unspecified: Secondary | ICD-10-CM | POA: Diagnosis not present

## 2020-04-25 DIAGNOSIS — M5442 Lumbago with sciatica, left side: Secondary | ICD-10-CM | POA: Diagnosis not present

## 2020-04-25 DIAGNOSIS — Z79899 Other long term (current) drug therapy: Secondary | ICD-10-CM | POA: Diagnosis not present

## 2020-04-25 DIAGNOSIS — G8929 Other chronic pain: Secondary | ICD-10-CM | POA: Diagnosis not present

## 2020-04-26 DIAGNOSIS — K219 Gastro-esophageal reflux disease without esophagitis: Secondary | ICD-10-CM | POA: Diagnosis not present

## 2020-04-26 DIAGNOSIS — R739 Hyperglycemia, unspecified: Secondary | ICD-10-CM | POA: Diagnosis not present

## 2020-04-26 DIAGNOSIS — I1310 Hypertensive heart and chronic kidney disease without heart failure, with stage 1 through stage 4 chronic kidney disease, or unspecified chronic kidney disease: Secondary | ICD-10-CM | POA: Diagnosis not present

## 2020-04-26 DIAGNOSIS — E785 Hyperlipidemia, unspecified: Secondary | ICD-10-CM | POA: Diagnosis not present

## 2020-05-04 DIAGNOSIS — I2510 Atherosclerotic heart disease of native coronary artery without angina pectoris: Secondary | ICD-10-CM | POA: Diagnosis not present

## 2020-05-04 DIAGNOSIS — Z853 Personal history of malignant neoplasm of breast: Secondary | ICD-10-CM | POA: Diagnosis not present

## 2020-05-04 DIAGNOSIS — Z6834 Body mass index (BMI) 34.0-34.9, adult: Secondary | ICD-10-CM | POA: Diagnosis not present

## 2020-05-04 DIAGNOSIS — N182 Chronic kidney disease, stage 2 (mild): Secondary | ICD-10-CM | POA: Diagnosis not present

## 2020-05-04 DIAGNOSIS — R609 Edema, unspecified: Secondary | ICD-10-CM | POA: Diagnosis not present

## 2020-05-04 DIAGNOSIS — R7303 Prediabetes: Secondary | ICD-10-CM | POA: Diagnosis not present

## 2020-05-04 DIAGNOSIS — E785 Hyperlipidemia, unspecified: Secondary | ICD-10-CM | POA: Diagnosis not present

## 2020-05-04 DIAGNOSIS — I1310 Hypertensive heart and chronic kidney disease without heart failure, with stage 1 through stage 4 chronic kidney disease, or unspecified chronic kidney disease: Secondary | ICD-10-CM | POA: Diagnosis not present

## 2020-05-04 DIAGNOSIS — G4700 Insomnia, unspecified: Secondary | ICD-10-CM | POA: Diagnosis not present

## 2020-05-04 DIAGNOSIS — K219 Gastro-esophageal reflux disease without esophagitis: Secondary | ICD-10-CM | POA: Diagnosis not present

## 2020-05-09 DIAGNOSIS — U099 Post covid-19 condition, unspecified: Secondary | ICD-10-CM | POA: Diagnosis not present

## 2020-05-09 DIAGNOSIS — R053 Chronic cough: Secondary | ICD-10-CM | POA: Diagnosis not present

## 2020-05-18 DIAGNOSIS — U099 Post covid-19 condition, unspecified: Secondary | ICD-10-CM | POA: Diagnosis not present

## 2020-05-18 DIAGNOSIS — R059 Cough, unspecified: Secondary | ICD-10-CM | POA: Diagnosis not present

## 2020-05-18 DIAGNOSIS — R053 Chronic cough: Secondary | ICD-10-CM | POA: Diagnosis not present

## 2020-05-18 DIAGNOSIS — J189 Pneumonia, unspecified organism: Secondary | ICD-10-CM | POA: Diagnosis not present

## 2020-05-21 DIAGNOSIS — D225 Melanocytic nevi of trunk: Secondary | ICD-10-CM | POA: Diagnosis not present

## 2020-05-21 DIAGNOSIS — L820 Inflamed seborrheic keratosis: Secondary | ICD-10-CM | POA: Diagnosis not present

## 2020-05-21 DIAGNOSIS — L821 Other seborrheic keratosis: Secondary | ICD-10-CM | POA: Diagnosis not present

## 2020-05-23 DIAGNOSIS — G8929 Other chronic pain: Secondary | ICD-10-CM | POA: Diagnosis not present

## 2020-05-23 DIAGNOSIS — Z79899 Other long term (current) drug therapy: Secondary | ICD-10-CM | POA: Diagnosis not present

## 2020-05-23 DIAGNOSIS — M5136 Other intervertebral disc degeneration, lumbar region: Secondary | ICD-10-CM | POA: Diagnosis not present

## 2020-05-23 DIAGNOSIS — M5442 Lumbago with sciatica, left side: Secondary | ICD-10-CM | POA: Diagnosis not present

## 2020-05-28 DIAGNOSIS — N390 Urinary tract infection, site not specified: Secondary | ICD-10-CM | POA: Diagnosis not present

## 2020-05-28 DIAGNOSIS — N3946 Mixed incontinence: Secondary | ICD-10-CM | POA: Diagnosis not present

## 2020-06-07 ENCOUNTER — Encounter: Payer: Self-pay | Admitting: Cardiology

## 2020-06-07 ENCOUNTER — Other Ambulatory Visit: Payer: Self-pay

## 2020-06-07 ENCOUNTER — Ambulatory Visit: Payer: PPO | Admitting: Cardiology

## 2020-06-07 VITALS — BP 112/64 | HR 60 | Ht 64.0 in | Wt 208.4 lb

## 2020-06-07 DIAGNOSIS — G4733 Obstructive sleep apnea (adult) (pediatric): Secondary | ICD-10-CM | POA: Diagnosis not present

## 2020-06-07 DIAGNOSIS — I2510 Atherosclerotic heart disease of native coronary artery without angina pectoris: Secondary | ICD-10-CM

## 2020-06-07 NOTE — Progress Notes (Signed)
Cardiology Office Note:    Date:  06/07/2020   ID:  Karen Castillo, Karen Castillo [birth year], MRN [id number]  PCP:  Renaldo Reel, PA  Cardiologist:  Berniece Salines, DO  Electrophysiologist:  None   Referring MD: Nicoletta Dress, MD   I am doing well  History of Present Illness:    Karen Castillo is a 80 y.o. female with a hx of coronary artery disease on CTA, hypertension, hyperlipidemia, sleep apnea is here today for follow-up visit.  Last saw the patient in January 2022 at that time she was short of breath which I suspect was multifactorial I started patient on Ranexa twice daily as well as Lasix daily today.  She is doing well on this medication regimen. Fortunately recently she was also diagnosed with a pneumonia.  Today she tells me that since she has been treated for her pneumonia she is doing a lot better  Past Medical History:  Diagnosis Date  . Abdominal pain   . Acute post-operative pain 07/13/2017  . Allergic rhinitis   . Anemia   . Arthritis   . Bipolar disorder ([place])    patient denies  . Cancer (HCC)    SKIN    ,    LEFT BREAST   . Chronic bilateral low back pain 09/16/2016  . Chronic constipation   . Chronic pain syndrome   . Constipation   . Coronary artery disease involving native coronary artery of native heart without angina pectoris 02/13/2020  . 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 Dry eyes   . Dysuria   . Elevated cholesterol   . 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 Gallstones   . GERD (gastroesophageal reflux disease)   . Glaucoma    both eyes  . Gout   . Hypertension   . IBS (irritable bowel syndrome)   . Idiopathic peripheral neuropathy 06/11/2015  . 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 Ingrown nail of great toe of right foot 05/04/2017  . Insomnia   . Intractable low back pain 07/13/2017  . Low back pain 08/27/2017  . Malignant neoplasm of left breast ([place]) 04/24/2011  . Nausea without vomiting   . Obesity   . Obesity (BMI  30.0-34.9) 02/13/2020  . Pain syndrome, chronic 11/29/2015  . Paronychia of great toe, right 05/04/2017  . 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/26/2010   3.6 cm  . Postoperative anemia due to acute blood loss 08/29/2017  . 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 Radiculopathy 05/04/2014  . Radiculopathy of lumbar region 11/29/2015  . Risk for falls 06/11/2015  . Schizophrenia ([place])   . Sleep apnea    USES CPAP   . Sleep apnea, obstructive 04/03/2015  . Urticaria   . Vitamin deficiency     Past Surgical History:  Procedure Laterality Date  . ABDOMINAL EXPOSURE N/A 05/04/2014   Procedure: ABDOMINAL EXPOSURE;  Surgeon: Serafina Mitchell, MD;  Location: [facility];  Service: Vascular;  Laterality: N/A;  . ANTERIOR LUMBAR FUSION N/A 05/04/2014   Procedure: ANTERIOR LUMBAR FUSION 1 LEVEL;  Surgeon: Sinclair Ship, MD;  Location: [facility];  Service: Orthopedics;  Laterality: N/A;  Lumbar 5-sacrum 1 anterior lumbar interbody fusion with instrumentation and allograft  . ANTERIOR LUMBAR FUSION  07/2017  . ANTERIOR LUMBAR FUSION  08/2017  . BACK SURGERY     2010 ,2015LUMB FUSION, 06/2013 [facility] FUSION  . BACK SURGERY  07/2017   Lumbar  . BREAST BIOPSY  2013  pt said it was positive   . BREAST SURGERY     LEFT BRREAST BX  . CARPAL TUNNEL RELEASE     1994 RT  . CARPAL TUNNEL RELEASE Bilateral   . CATARACT EXTRACTION, BILATERAL  2017  . CHOLECYSTECTOMY     2004  . COLONOSCOPY W/ BIOPSIES     Colonoscopy 1017,5102, 2009, 2011  . ERCP     2009  . ESOPHAGOGASTRODUODENOSCOPY  08/09/2017   Small hiatal hernia. Mild gastritis. Incidenta; duodenal diverticulum. Incidental gastric polyp.   Marland Kitchen LEEP     1992 and 1994  . SKIN CANCER EXCISION     X 4   . thorocolumbar fusion  06/2013  . TUBAL LIGATION      Current Medications: Current Meds  Medication Sig  . alendronate (FOSAMAX) 70 MG tablet Take 70 mg by mouth every Thursday. Take with a full glass of water on an empty stomach.  Marland Kitchen  allopurinol (ZYLOPRIM) 100 MG tablet Take 100 mg by mouth daily.  Marland Kitchen amLODipine (NORVASC) 10 MG tablet Take 10 mg by mouth daily.  Marland Kitchen ascorbic acid (VITAMIN C) 500 MG tablet Take 1,000 mg by mouth daily.  Marland Kitchen aspirin EC 81 MG tablet Take 81 mg by mouth daily.  . benzonatate (TESSALON) 100 MG capsule Take 100-200 mg by mouth every 8 (eight) hours as needed.  . Biotin 10 MG TABS Take 10 mg by mouth daily.  . calcium carbonate (OSCAL) 1500 (600 Ca) MG TABS tablet Take 600 mg of elemental calcium by mouth 3 (three) times daily with meals.  . celecoxib (CELEBREX) 200 MG capsule Take 200 mg by mouth daily.  . Cholecalciferol (VITAMIN D3) 2000 units TABS Take 1 tablet by mouth daily.  . cloNIDine (CATAPRES) 0.1 MG tablet Take 0.1 mg by mouth at bedtime.  . cycloSPORINE (RESTASIS) 0.05 % ophthalmic emulsion Place 1 drop into both eyes 2 (two) times daily.   . fexofenadine (ALLEGRA) 180 MG tablet Take 180 mg by mouth daily.  . furosemide (LASIX) 40 MG tablet Take 1 tablet (40 mg total) by mouth every other day.  . hydrochlorothiazide (HYDRODIURIL) 25 MG tablet Take 25 mg by mouth daily.  Marland Kitchen HYDROmorphone (DILAUDID) 4 MG tablet Take by mouth. Twice a day as needed per patient  . HYDROmorphone HCl (EXALGO) 8 MG TB24 Take 8 mg by mouth daily as needed.  . hydrOXYzine (ATARAX/VISTARIL) 25 MG tablet Take 25 mg by mouth daily.  . Lactobacillus (PROBIOTIC ACIDOPHILUS PO) Take 1 tablet by mouth daily.  . lansoprazole (PREVACID) 30 MG capsule Take 1 capsule (30 mg total) by mouth daily before breakfast. 30 minutes before breakfast  . latanoprost (XALATAN) 0.005 % ophthalmic solution Place 1 drop into both eyes at bedtime.  Marland Kitchen linaclotide (LINZESS) 290 MCG CAPS capsule Take 1 capsule (290 mcg total) by mouth daily before breakfast.  . LIVALO 2 MG TABS Take 1 tablet by mouth at bedtime.  . Magnesium 250 MG TABS Take 250 mg by mouth daily.  . methocarbamol (ROBAXIN) 750 MG tablet Take 750 mg by mouth 3 (three) times  daily.  . nadolol (CORGARD) 40 MG tablet Take 60 mg by mouth daily.  Marland Kitchen NARCAN 4 MG/0.1ML LIQD nasal spray kit Place 4 mg into the nose. I spray in both nares PRN  . nitroGLYCERIN (NITROSTAT) 0.4 MG SL tablet Place 1 tablet (0.4 mg total) under the tongue every 5 (five) minutes as needed for chest pain.  . Omega-3 Fatty Acids (FISH OIL) 600 MG CAPS  Take 600 mg by mouth 2 (two) times daily.  Marland Kitchen oxybutynin (DITROPAN) 5 MG tablet Take 5 mg by mouth 2 (two) times daily.  . polyethylene glycol (MIRALAX / GLYCOLAX) 17 g packet Take 17 g by mouth daily as needed for mild constipation.  . ranolazine (RANEXA) 500 MG 12 hr tablet Take 1 tablet (500 mg total) by mouth 2 (two) times daily.  Marland Kitchen spironolactone (ALDACTONE) 25 MG tablet Take 25 mg by mouth daily.  . timolol (BETIMOL) 0.5 % ophthalmic solution Place 1 drop into both eyes daily. In the morning  . trazodone (DESYREL) 300 MG tablet Take 300 mg by mouth at bedtime.  Marland Kitchen trimethoprim (TRIMPEX) 100 MG tablet Take 100 mg by mouth daily.  . valsartan (DIOVAN) 80 MG tablet Take 80 mg by mouth daily.  . Wheat Dextrin (BENEFIBER) POWD Take 1 packet by mouth daily. Clear and Sugar FREE. 1 tablespoon daily     Allergies:   Ciprofloxacin, Cyclobenzaprine, Pregabalin, Fluvastatin, Meloxicam, Metaxalone, Pramipexole, Pravastatin, Rosuvastatin, Sertraline, Statins, Other, Pravachol [pravastatin sodium], Amitriptyline, Arthrotec [diclofenac-misoprostol], Augmentin [amoxicillin-pot clavulanate], Bromfenac, Cephalexin, Conj estrog-medroxyprogest ace, Diclofenac-misoprostol, Elemental sulfur, Erythromycin, Esomeprazole, Esomeprazole magnesium, Levofloxacin, Methadone, Metoclopramide, Oxycodone, Propoxyphene, Rofecoxib, Tramadol, and Zoloft [sertraline hcl]   Social History   Socioeconomic History  . Marital status: Widowed    Spouse name: Not on file  . Number of children: 2  . Years of education: Not on file  . Highest education level: Not on file  Occupational  History  . Occupation: Retired  Tobacco Use  . Smoking status: Never Smoker  . Smokeless tobacco: Never Used  Vaping Use  . Vaping Use: Never used  Substance and Sexual Activity  . Alcohol use: No  . Drug use: No  . Sexual activity: Not on file  Other Topics Concern  . Not on file  Social History Narrative  . Not on file   Social Determinants of Health   Financial Resource Strain: Not on file  Food Insecurity: Not on file  Transportation Needs: Not on file  Physical Activity: Not on file  Stress: Not on file  Social Connections: Not on file     Family History: The patient's family history includes Diabetes in her mother; Prostate cancer in her father; Stroke in her sister. There is no history of Colon cancer, Esophageal cancer, Stomach cancer, or Rectal cancer.  ROS:   Review of Systems  Constitution: Negative for decreased appetite, fever and weight gain.  HENT: Negative for congestion, ear discharge, hoarse voice and sore throat.   Eyes: Negative for discharge, redness, vision loss in right eye and visual halos.  Cardiovascular: Negative for chest pain, dyspnea on exertion, leg swelling, orthopnea and palpitations.  Respiratory: Negative for cough, hemoptysis, shortness of breath and snoring.   Endocrine: Negative for heat intolerance and polyphagia.  Hematologic/Lymphatic: Negative for bleeding problem. Does not bruise/bleed easily.  Skin: Negative for flushing, nail changes, rash and suspicious lesions.  Musculoskeletal: Negative for arthritis, joint pain, muscle cramps, myalgias, neck pain and stiffness.  Gastrointestinal: Negative for abdominal pain, bowel incontinence, diarrhea and excessive appetite.  Genitourinary: Negative for decreased libido, genital sores and incomplete emptying.  Neurological: Negative for brief paralysis, focal weakness, headaches and loss of balance.  Psychiatric/Behavioral: Negative for altered mental status, depression and suicidal ideas.   Allergic/Immunologic: Negative for HIV exposure and persistent infections.    EKGs/Labs/Other Studies Reviewed:    The following studies were reviewed today:   EKG:  The ekg ordered today demonstrates  TTE IMPRESSIONS  1. Left ventricular ejection fraction, by visual estimation, is 65 to 70%.  The left ventricle has normal function. There is mildly increased left ventricular hypertrophy.  2. Global right ventricle has normal systolic function.The right  ventricular size is normal. No increase in right ventricular wall  thickness.  3. Left atrial size was upper normal.  4. Right atrial size was normal.  5. Mild aortic valve annular calcification.  6. Moderate mitral annular calcification.  7. The mitral valve is degenerative. Mild mitral valve regurgitation.  8. The tricuspid valve is grossly normal. Tricuspid valve regurgitation is trivial.  9. The aortic valve is tricuspid. Aortic valve regurgitation is not visualized.  10. The pulmonic valve was not well visualized. Pulmonic valve regurgitation is trivial.  11. TR signal is inadequate for assessing pulmonary artery systolic pressure.   FFRctresultsFINDINGS: All values in normal range Specifically RCA 0.98 LAD 0.90 and Circumflex 0.97  IMPRESSION: Negative FFR CT   CCTA  Calcium Score: LM and 3 vessel calcium noted  Coronary Arteries: Right dominant with no anomalies  LM: 1-24% calcified ostial plaque  LAD: 50-69% long calcified plaque proximally 25-49% mixed plaque in mid vessel and 1-24% calcified plaque distally  D1: Normal  D2: Normal  Circumflex: 1-24% mixed plaque in mid vessel involving OM2  OM1: Normal high take off  RCA: 1-24% calcified plaque proximally 25-49% calcified plaque mid vessel 1-24% calcified plaque distally  PDA: Normal  PLA: 1-24% soft plaque  IMPRESSION: 1. Normal aortic root 3.2 cm with moderate calcific atherosclerosis  2. Calcium score 1213  involving LM and 3 vessels this is 95 th percentile for age and sex  58. CAD see description above study sent for FFR CT to r/o obstructive disease in mid RCA and proximal LAD  [place]   Recent Labs: 07/21/2019: ALT 15 03/15/2020: BUN 16; Creatinine, Ser 1.10; Hemoglobin 12.6; Magnesium 2.2; NT-Pro BNP 408; Platelets 266; Potassium 4.7; Sodium 135  Recent Lipid Panel No results found for: CHOL, TRIG, HDL, CHOLHDL, VLDL, LDLCALC, LDLDIRECT  Physical Exam:    VS:  BP 112/64   Pulse 60   Ht 5' 4" (1.626 m)   Wt 208 lb 6.4 oz (94.5 kg)   SpO2 96%   BMI 35.77 kg/m     Wt Readings from Last 3 Encounters:  06/07/20 208 lb 6.4 oz (94.5 kg)  03/29/20 202 lb 11.2 oz (91.9 kg)  03/15/20 208 lb 9.6 oz (94.6 kg)     GEN: Well nourished, well developed in no acute distress HEENT: Normal NECK: No JVD; No carotid bruits LYMPHATICS: No lymphadenopathy CARDIAC: S1S2 noted,RRR, no murmurs, rubs, gallops RESPIRATORY:  Clear to auscultation without rales, wheezing or rhonchi  ABDOMEN: Soft, non-tender, non-distended, +bowel sounds, no guarding. EXTREMITIES: No edema, No cyanosis, no clubbing MUSCULOSKELETAL:  No deformity  SKIN: Warm and dry NEUROLOGIC:  Alert and oriented x 3, non-focal PSYCHIATRIC:  Normal affect, good insight  ASSESSMENT:    1. Coronary artery disease involving native coronary artery of native heart, unspecified whether angina present   2. Coronary artery disease involving native coronary artery of native heart without angina pectoris   3. Sleep apnea, obstructive    PLAN:     She appears to be doing well from a cardiovascular standpoint.  No medication changes will be made today.  She is happy with where she is at this time.  Continue use of CPAP  The patient is in agreement with the above plan. The patient left  the office in stable condition.  The patient will follow up in 1 year or sooner if needed.   Medication Adjustments/Labs and Tests  Ordered: Current medicines are reviewed at length with the patient today.  Concerns regarding medicines are outlined above.  No orders of the defined types were placed in this encounter.  No orders of the defined types were placed in this encounter.   Patient Instructions  Medication Instructions:  Your physician recommends that you continue on your current medications as directed. Please refer to the Current Medication list given to you today.  *If you need a refill on your cardiac medications before your next appointment, please call your pharmacy*   Lab Work: None If you have labs (blood work) drawn today and your tests are completely normal, you will receive your results only by: Marland Kitchen MyChart Message (if you have MyChart) OR . A paper copy in the mail If you have any lab test that is abnormal or we need to change your treatment, we will call you to review the results.   Testing/Procedures: None   Follow-Up: At [facility], you and your health needs are our priority.  As part of our continuing mission to provide you with exceptional heart care, we have created designated Provider Care Teams.  These Care Teams include your primary Cardiologist (physician) and Advanced Practice Providers (APPs -  Physician Assistants and Nurse Practitioners) who all work together to provide you with the care you need, when you need it.  We recommend signing up for the patient portal called "MyChart".  Sign up information is provided on this After Visit Summary.  MyChart is used to connect with patients for Virtual Visits (Telemedicine).  Patients are able to view lab/test results, encounter notes, upcoming appointments, etc.  Non-urgent messages can be sent to your provider as well.   To learn more about what you can do with MyChart, go to [email or web address].    Your next appointment:   1year(s)  The format for your next appointment:   In Person  Provider:   Berniece Salines, DO   Other  Instructions      Adopting a Healthy Lifestyle.  Know what a healthy weight is for you (roughly BMI <25) and aim to maintain this   Aim for 7+ servings of fruits and vegetables daily   65-80+ fluid ounces of water or unsweet tea for healthy kidneys   Limit to max 1 drink of alcohol per day; avoid smoking/tobacco   Limit animal fats in diet for cholesterol and heart health - choose grass fed whenever available   Avoid highly processed foods, and foods high in saturated/trans fats   Aim for low stress - take time to unwind and care for your mental health   Aim for 150 min of moderate intensity exercise weekly for heart health, and weights twice weekly for bone health   Aim for 7-9 hours of sleep daily   When it comes to diets, agreement about the perfect plan isnt easy to find, even among the experts. Experts at the [employer] developed an idea known as the Healthy Eating Plate. Just imagine a plate divided into logical, healthy portions.   The emphasis is on diet quality:   Load up on vegetables and fruits - one-half of your plate: Aim for color and variety, and remember that potatoes dont count.   Go for whole grains - one-quarter of your plate: Whole wheat, barley, wheat berries, quinoa, oats, brown rice,  and foods made with them. If you want pasta, go with whole wheat pasta.   Protein power - one-quarter of your plate: Fish, chicken, beans, and nuts are all healthy, versatile protein sources. Limit red meat.   The diet, however, does go beyond the plate, offering a few other suggestions.   Use healthy plant oils, such as olive, canola, soy, corn, sunflower and peanut. Check the labels, and avoid partially hydrogenated oil, which have unhealthy trans fats.   If youre thirsty, drink water. Coffee and tea are good in moderation, but skip sugary drinks and limit milk and dairy products to one or two daily servings.   The type of carbohydrate in the diet  is more important than the amount. Some sources of carbohydrates, such as vegetables, fruits, whole grains, and beans-are healthier than others.   Finally, stay active  Signed, Berniece Salines, DO  06/07/2020 1:41 PM    [facility]

## 2020-06-07 NOTE — Patient Instructions (Signed)

## 2020-06-15 DIAGNOSIS — J189 Pneumonia, unspecified organism: Secondary | ICD-10-CM | POA: Diagnosis not present

## 2020-06-25 DIAGNOSIS — M961 Postlaminectomy syndrome, not elsewhere classified: Secondary | ICD-10-CM | POA: Diagnosis not present

## 2020-06-25 DIAGNOSIS — M5442 Lumbago with sciatica, left side: Secondary | ICD-10-CM | POA: Diagnosis not present

## 2020-06-25 DIAGNOSIS — M5136 Other intervertebral disc degeneration, lumbar region: Secondary | ICD-10-CM | POA: Diagnosis not present

## 2020-06-25 DIAGNOSIS — Z79899 Other long term (current) drug therapy: Secondary | ICD-10-CM | POA: Diagnosis not present

## 2020-06-25 DIAGNOSIS — G8929 Other chronic pain: Secondary | ICD-10-CM | POA: Diagnosis not present

## 2020-06-29 DIAGNOSIS — J189 Pneumonia, unspecified organism: Secondary | ICD-10-CM | POA: Diagnosis not present

## 2020-06-29 DIAGNOSIS — R0602 Shortness of breath: Secondary | ICD-10-CM | POA: Diagnosis not present

## 2020-06-29 DIAGNOSIS — R059 Cough, unspecified: Secondary | ICD-10-CM | POA: Diagnosis not present

## 2020-07-03 ENCOUNTER — Other Ambulatory Visit: Payer: Self-pay | Admitting: Cardiology

## 2020-07-04 NOTE — Telephone Encounter (Signed)
Rx refill sent to pharmacy. 

## 2020-07-06 DIAGNOSIS — U099 Post covid-19 condition, unspecified: Secondary | ICD-10-CM | POA: Diagnosis not present

## 2020-07-06 DIAGNOSIS — R5382 Chronic fatigue, unspecified: Secondary | ICD-10-CM | POA: Diagnosis not present

## 2020-07-06 DIAGNOSIS — Z139 Encounter for screening, unspecified: Secondary | ICD-10-CM | POA: Diagnosis not present

## 2020-07-06 DIAGNOSIS — E538 Deficiency of other specified B group vitamins: Secondary | ICD-10-CM | POA: Diagnosis not present

## 2020-07-06 DIAGNOSIS — R053 Chronic cough: Secondary | ICD-10-CM | POA: Diagnosis not present

## 2020-07-25 DIAGNOSIS — M419 Scoliosis, unspecified: Secondary | ICD-10-CM | POA: Diagnosis not present

## 2020-07-25 DIAGNOSIS — M5136 Other intervertebral disc degeneration, lumbar region: Secondary | ICD-10-CM | POA: Diagnosis not present

## 2020-07-25 DIAGNOSIS — G8929 Other chronic pain: Secondary | ICD-10-CM | POA: Diagnosis not present

## 2020-07-25 DIAGNOSIS — Z79899 Other long term (current) drug therapy: Secondary | ICD-10-CM | POA: Diagnosis not present

## 2020-07-25 DIAGNOSIS — M5442 Lumbago with sciatica, left side: Secondary | ICD-10-CM | POA: Diagnosis not present

## 2020-08-08 ENCOUNTER — Encounter: Payer: Self-pay | Admitting: Cardiology

## 2020-08-08 ENCOUNTER — Other Ambulatory Visit: Payer: Self-pay

## 2020-08-08 ENCOUNTER — Ambulatory Visit: Payer: PPO | Admitting: Cardiology

## 2020-08-08 VITALS — BP 126/68 | HR 63 | Ht 64.0 in | Wt 212.6 lb

## 2020-08-08 DIAGNOSIS — J189 Pneumonia, unspecified organism: Secondary | ICD-10-CM | POA: Diagnosis not present

## 2020-08-08 DIAGNOSIS — G4733 Obstructive sleep apnea (adult) (pediatric): Secondary | ICD-10-CM | POA: Diagnosis not present

## 2020-08-08 DIAGNOSIS — J9811 Atelectasis: Secondary | ICD-10-CM | POA: Diagnosis not present

## 2020-08-08 DIAGNOSIS — I2510 Atherosclerotic heart disease of native coronary artery without angina pectoris: Secondary | ICD-10-CM | POA: Diagnosis not present

## 2020-08-08 DIAGNOSIS — E66811 Obesity, class 1: Secondary | ICD-10-CM

## 2020-08-08 DIAGNOSIS — I10 Essential (primary) hypertension: Secondary | ICD-10-CM

## 2020-08-08 DIAGNOSIS — E669 Obesity, unspecified: Secondary | ICD-10-CM

## 2020-08-08 NOTE — Patient Instructions (Signed)
Medication Instructions:  Your physician has recommended you make the following change in your medication:  TAKE: Lasix as needed if gain 3 pounds in 2 days or 5 pounds in 1 week.  *If you need a refill on your cardiac medications before your next appointment, please call your pharmacy*   Lab Work: None If you have labs (blood work) drawn today and your tests are completely normal, you will receive your results only by: Marland Kitchen MyChart Message (if you have MyChart) OR . A paper copy in the mail If you have any lab test that is abnormal or we need to change your treatment, we will call you to review the results.   Testing/Procedures: None   Follow-Up: At [facility], you and your health needs are our priority.  As part of our continuing mission to provide you with exceptional heart care, we have created designated Provider Care Teams.  These Care Teams include your primary Cardiologist (physician) and Advanced Practice Providers (APPs -  Physician Assistants and Nurse Practitioners) who all work together to provide you with the care you need, when you need it.  We recommend signing up for the patient portal called "MyChart".  Sign up information is provided on this After Visit Summary.  MyChart is used to connect with patients for Virtual Visits (Telemedicine).  Patients are able to view lab/test results, encounter notes, upcoming appointments, etc.  Non-urgent messages can be sent to your provider as well.   To learn more about what you can do with MyChart, go to [email or web address].    Your next appointment:   1 year(s)  The format for your next appointment:   In Person  Provider:   Berniece Salines, DO   Other Instructions

## 2020-08-08 NOTE — Progress Notes (Signed)
Cardiology Office Note:    Date:  08/08/2020   ID:  Karen Castillo 1940-05-30, MRN [id number]  PCP:  Renaldo Reel, PA  Cardiologist:  Berniece Salines, DO  Electrophysiologist:  None   Referring MD: Nicoletta Dress, MD   " I have been doing good - but want to talk about my medications"  History of Present Illness:    Karen Castillo is a 80 y.o. female with a hx of coronary artery disease on CTA, hypertension, hyperlipidemia, sleep apnea is here today for follow-up visit.   I saw the patient on June 07, 2020 at that time she appeared to be doing well from a cardiovascular standpoint.  Here today for follow-up visit she like to discuss further medications.  Past Medical History:  Diagnosis Date  . Abdominal pain   . Acute post-operative pain 07/13/2017  . Allergic rhinitis   . Anemia   . Arthritis   . Bipolar disorder ([place])    patient denies  . Cancer (HCC)    SKIN    ,    LEFT BREAST   . Chronic bilateral low back pain 09/16/2016  . Chronic constipation   . Chronic pain syndrome   . Constipation   . Coronary artery disease involving native coronary artery of native heart without angina pectoris 02/13/2020  . 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 Dry eyes   . Dysuria   . Elevated cholesterol   . 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 Gallstones   . GERD (gastroesophageal reflux disease)   . Glaucoma    both eyes  . Gout   . Hypertension   . IBS (irritable bowel syndrome)   . Idiopathic peripheral neuropathy 06/11/2015  . 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 Ingrown nail of great toe of right foot 05/04/2017  . Insomnia   . Intractable low back pain 07/13/2017  . Low back pain 08/27/2017  . Malignant neoplasm of left breast ([place]) 04/24/2011  . Nausea without vomiting   . Obesity   . Obesity (BMI 30.0-34.9) 02/13/2020  . Pain syndrome, chronic 11/29/2015  . Paronychia of great toe, right 05/04/2017  . Pericardial cyst along right cardiophrenic angle  03/26/2010   3.6 cm  . Postoperative anemia due to acute blood loss 08/29/2017  . 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 Radiculopathy 05/04/2014  . Radiculopathy of lumbar region 11/29/2015  . Risk for falls 06/11/2015  . Schizophrenia ([place])   . Sleep apnea    USES CPAP   . Sleep apnea, obstructive 04/03/2015  . Urticaria   . Vitamin deficiency     Past Surgical History:  Procedure Laterality Date  . ABDOMINAL EXPOSURE N/A 05/04/2014   Procedure: ABDOMINAL EXPOSURE;  Surgeon: Serafina Mitchell, MD;  Location: [facility];  Service: Vascular;  Laterality: N/A;  . ANTERIOR LUMBAR FUSION N/A 05/04/2014   Procedure: ANTERIOR LUMBAR FUSION 1 LEVEL;  Surgeon: Sinclair Ship, MD;  Location: [facility];  Service: Orthopedics;  Laterality: N/A;  Lumbar 5-sacrum 1 anterior lumbar interbody fusion with instrumentation and allograft  . ANTERIOR LUMBAR FUSION  07/2017  . ANTERIOR LUMBAR FUSION  08/2017  . BACK SURGERY     2010 ,2015LUMB FUSION, 06/2013 [facility] FUSION  . BACK SURGERY  07/2017   Lumbar  . BREAST BIOPSY  2013   pt said it was positive   . BREAST SURGERY     LEFT [place]  RT  . CARPAL TUNNEL RELEASE Bilateral   . CATARACT EXTRACTION, BILATERAL  2017  . CHOLECYSTECTOMY     2004  . COLONOSCOPY W/ BIOPSIES     Colonoscopy [id number], 2009, 2011  . ERCP     2009  . ESOPHAGOGASTRODUODENOSCOPY  08/09/2017   Small hiatal hernia. Mild gastritis. Incidenta; duodenal diverticulum. Incidental gastric polyp.   Marland Kitchen LEEP     1992 and 1994  . SKIN CANCER EXCISION     X 4   . thorocolumbar fusion  06/2013  . TUBAL LIGATION      Current Medications: Current Meds  Medication Sig  . alendronate (FOSAMAX) 70 MG tablet Take 70 mg by mouth every Thursday. Take with a full glass of water on an empty stomach.  Marland Kitchen allopurinol (ZYLOPRIM) 100 MG tablet Take 100 mg by mouth daily.  Marland Kitchen amLODipine (NORVASC) 10 MG tablet Take 10 mg by mouth daily.  Marland Kitchen ascorbic acid (VITAMIN  C) 500 MG tablet Take 1,000 mg by mouth daily.  Marland Kitchen aspirin EC 81 MG tablet Take 81 mg by mouth daily.  . benzonatate (TESSALON) 100 MG capsule Take 100-200 mg by mouth every 8 (eight) hours as needed.  . Biotin 10 MG TABS Take 10 mg by mouth daily.  . calcium carbonate (OSCAL) 1500 (600 Ca) MG TABS tablet Take 600 mg of elemental calcium by mouth 3 (three) times daily with meals.  . celecoxib (CELEBREX) 200 MG capsule Take 200 mg by mouth daily.  . Cholecalciferol (VITAMIN D3) 2000 units TABS Take 1 tablet by mouth daily.  . cloNIDine (CATAPRES) 0.1 MG tablet Take 0.1 mg by mouth at bedtime.  . cycloSPORINE (RESTASIS) 0.05 % ophthalmic emulsion Place 1 drop into both eyes 2 (two) times daily.   . fexofenadine (ALLEGRA) 180 MG tablet Take 180 mg by mouth daily.  . furosemide (LASIX) 40 MG tablet TAKE 1 TABLET BY MOUTH EVERY OTHER DAY  . hydrochlorothiazide (HYDRODIURIL) 25 MG tablet Take 25 mg by mouth daily.  Marland Kitchen HYDROmorphone (DILAUDID) 4 MG tablet Take by mouth. Twice a day as needed per patient  . HYDROmorphone HCl (EXALGO) 8 MG TB24 Take 8 mg by mouth daily as needed.  . hydrOXYzine (ATARAX/VISTARIL) 25 MG tablet Take 25 mg by mouth daily.  . Lactobacillus (PROBIOTIC ACIDOPHILUS PO) Take 1 tablet by mouth daily.  . lansoprazole (PREVACID) 30 MG capsule Take 1 capsule (30 mg total) by mouth daily before breakfast. 30 minutes before breakfast  . latanoprost (XALATAN) 0.005 % ophthalmic solution Place 1 drop into both eyes at bedtime.  Marland Kitchen linaclotide (LINZESS) 290 MCG CAPS capsule Take 1 capsule (290 mcg total) by mouth daily before breakfast.  . LIVALO 2 MG TABS Take 1 tablet by mouth at bedtime.  . Magnesium 250 MG TABS Take 250 mg by mouth daily.  . methocarbamol (ROBAXIN) 750 MG tablet Take 750 mg by mouth 3 (three) times daily.  . nadolol (CORGARD) 40 MG tablet Take 60 mg by mouth daily.  Marland Kitchen NARCAN 4 MG/0.1ML LIQD nasal spray kit Place 4 mg into the nose. I spray in both nares PRN  .  nitroGLYCERIN (NITROSTAT) 0.4 MG SL tablet Place 1 tablet (0.4 mg total) under the tongue every 5 (five) minutes as needed for chest pain.  . Omega-3 Fatty Acids (FISH OIL) 600 MG CAPS Take 600 mg by mouth 2 (two) times daily.  Marland Kitchen oxybutynin (DITROPAN) 5 MG tablet Take 5 mg by mouth 2 (two) times daily.  . polyethylene glycol (MIRALAX /  GLYCOLAX) 17 g packet Take 17 g by mouth daily as needed for mild constipation.  . ranolazine (RANEXA) 500 MG 12 hr tablet Take 1 tablet (500 mg total) by mouth 2 (two) times daily.  Marland Kitchen spironolactone (ALDACTONE) 25 MG tablet Take 25 mg by mouth daily.  . timolol (BETIMOL) 0.5 % ophthalmic solution Place 1 drop into both eyes daily. In the morning  . trazodone (DESYREL) 300 MG tablet Take 300 mg by mouth at bedtime.  Marland Kitchen trimethoprim (TRIMPEX) 100 MG tablet Take 100 mg by mouth daily.  . valsartan (DIOVAN) 80 MG tablet Take 80 mg by mouth daily.  . Wheat Dextrin (BENEFIBER) POWD Take 1 packet by mouth daily. Clear and Sugar FREE. 1 tablespoon daily     Allergies:   Ciprofloxacin, Cyclobenzaprine, Pregabalin, Fluvastatin, Meloxicam, Metaxalone, Pramipexole, Pravastatin, Rosuvastatin, Sertraline, Statins, Other, Pravachol [pravastatin sodium], Amitriptyline, Arthrotec [diclofenac-misoprostol], Augmentin [amoxicillin-pot clavulanate], Bromfenac, Cephalexin, Conj estrog-medroxyprogest ace, Diclofenac-misoprostol, Elemental sulfur, Erythromycin, Esomeprazole, Esomeprazole magnesium, Levofloxacin, Methadone, Metoclopramide, Oxycodone, Propoxyphene, Rofecoxib, Tramadol, and Zoloft [sertraline hcl]   Social History   Socioeconomic History  . Marital status: Widowed    Spouse name: Not on file  . Number of children: 2  . Years of education: Not on file  . Highest education level: Not on file  Occupational History  . Occupation: Retired  Tobacco Use  . Smoking status: Never Smoker  . Smokeless tobacco: Never Used  Vaping Use  . Vaping Use: Never used  Substance and  Sexual Activity  . Alcohol use: No  . Drug use: No  . Sexual activity: Not on file  Other Topics Concern  . Not on file  Social History Narrative  . Not on file   Social Determinants of Health   Financial Resource Strain: Not on file  Food Insecurity: Not on file  Transportation Needs: Not on file  Physical Activity: Not on file  Stress: Not on file  Social Connections: Not on file     Family History: The patient's family history includes Diabetes in her mother; Prostate cancer in her father; Stroke in her sister. There is no history of Colon cancer, Esophageal cancer, Stomach cancer, or Rectal cancer.  ROS:   Review of Systems  Constitution: Negative for decreased appetite, fever and weight gain.  HENT: Negative for congestion, ear discharge, hoarse voice and sore throat.   Eyes: Negative for discharge, redness, vision loss in right eye and visual halos.  Cardiovascular: Negative for chest pain, dyspnea on exertion, leg swelling, orthopnea and palpitations.  Respiratory: Negative for cough, hemoptysis, shortness of breath and snoring.   Endocrine: Negative for heat intolerance and polyphagia.  Hematologic/Lymphatic: Negative for bleeding problem. Does not bruise/bleed easily.  Skin: Negative for flushing, nail changes, rash and suspicious lesions.  Musculoskeletal: Negative for arthritis, joint pain, muscle cramps, myalgias, neck pain and stiffness.  Gastrointestinal: Negative for abdominal pain, bowel incontinence, diarrhea and excessive appetite.  Genitourinary: Negative for decreased libido, genital sores and incomplete emptying.  Neurological: Negative for brief paralysis, focal weakness, headaches and loss of balance.  Psychiatric/Behavioral: Negative for altered mental status, depression and suicidal ideas.  Allergic/Immunologic: Negative for HIV exposure and persistent infections.    EKGs/Labs/Other Studies Reviewed:    The following studies were reviewed  today:   EKG:  The ekg ordered today demonstrates sinus rhythm, heart rate 63 bpm with changes suggesting old inferior infarction compared to prior EKG no significant change by   TTEIMPRESSIONS November 2020 1. Left ventricular ejection fraction, by visual estimation,  is 65 to 70%.  The left ventricle has normal function. There is mildly increased left ventricular hypertrophy.  2. Global right ventricle has normal systolic function.The right  ventricular size is normal. No increase in right ventricular wall  thickness.  3. Left atrial size was upper normal.  4. Right atrial size was normal.  5. Mild aortic valve annular calcification.  6. Moderate mitral annular calcification.  7. The mitral valve is degenerative. Mild mitral valve regurgitation.  8. The tricuspid valve is grossly normal. Tricuspid valve regurgitation is trivial.  9. The aortic valve is tricuspid. Aortic valve regurgitation is not visualized.  10. The pulmonic valve was not well visualized. Pulmonic valve regurgitation is trivial.  11. TR signal is inadequate for assessing pulmonary artery systolic pressure.   FFRctresultsFINDINGS: November 2020 All values in normal range Specifically RCA 0.98 LAD 0.90 and Circumflex 0.97  IMPRESSION: Negative FFR CT   CCTA November 2020  Calcium Score: LM and 3 vessel calcium noted  Coronary Arteries: Right dominant with no anomalies  LM: 1-24% calcified ostial plaque  LAD: 50-69% long calcified plaque proximally 25-49% mixed plaque in mid vessel and 1-24% calcified plaque distally  D1: Normal  D2: Normal  Circumflex: 1-24% mixed plaque in mid vessel involving OM2  OM1: Normal high take off  RCA: 1-24% calcified plaque proximally 25-49% calcified plaque mid vessel 1-24% calcified plaque distally  PDA: Normal  PLA: 1-24% soft plaque  IMPRESSION: 1. Normal aortic root 3.2 cm with moderate calcific atherosclerosis  2. Calcium  score 1213 involving LM and 3 vessels this is 95 th percentile for age and sex  108. CAD see description above study sent for FFR CT to r/o obstructive disease in mid RCA and proximal LAD  [place]   Recent Labs: 03/15/2020: BUN 16; Creatinine, Ser 1.10; Hemoglobin 12.6; Magnesium 2.2; NT-Pro BNP 408; Platelets 266; Potassium 4.7; Sodium 135  Recent Lipid Panel No results found for: CHOL, TRIG, HDL, CHOLHDL, VLDL, LDLCALC, LDLDIRECT  Physical Exam:    VS:  BP 126/68   Pulse 63   Ht $R'5\' 4"'OS$  (1.626 m)   Wt 212 lb 9.6 oz (96.4 kg)   SpO2 96%   BMI 36.49 kg/m     Wt Readings from Last 3 Encounters:  08/08/20 212 lb 9.6 oz (96.4 kg)  06/07/20 208 lb 6.4 oz (94.5 kg)  03/29/20 202 lb 11.2 oz (91.9 kg)     GEN: Well nourished, well developed in no acute distress HEENT: Normal NECK: No JVD; No carotid bruits LYMPHATICS: No lymphadenopathy CARDIAC: S1S2 noted,RRR, no murmurs, rubs, gallops RESPIRATORY:  Clear to auscultation without rales, wheezing or rhonchi  ABDOMEN: Soft, non-tender, non-distended, +bowel sounds, no guarding. EXTREMITIES: No edema, No cyanosis, no clubbing MUSCULOSKELETAL:  No deformity  SKIN: Warm and dry NEUROLOGIC:  Alert and oriented x 3, non-focal PSYCHIATRIC:  Normal affect, good insight  ASSESSMENT:    1. Coronary artery disease involving native coronary artery of native heart without angina pectoris   2. Hypertension, unspecified type   3. Sleep apnea, obstructive   4. Obesity (BMI 30.0-34.9)    PLAN:    She appeared to be doing well from a cardiovascular standpoint.  No anginal symptoms.  We will continue patient on her aspirin as well as her Livalo. She had questions about her medications.  She is currently on Lasix, hydrochlorothiazide and Aldactone.  She is euvolemic, going to make her Lasix as needed.  The biggest problem with the patient is the fact  that she is intolerant to a lot of medications and with her HCTZ and Aldactone light of  her medication regimen her blood pressure has been controlled so I would like to keep her on it as she is not reporting any dizziness lightheadedness or evidence of orthostatic hypotension.  The patient understands the need to lose weight with diet and exercise. We have discussed specific strategies for this.  The patient is in agreement with the above plan. The patient left the office in stable condition.  The patient will follow up in 1 year or sooner if needed.   Medication Adjustments/Labs and Tests Ordered: Current medicines are reviewed at length with the patient today.  Concerns regarding medicines are outlined above.  No orders of the defined types were placed in this encounter.  No orders of the defined types were placed in this encounter.   There are no Patient Instructions on file for this visit.   Adopting a Healthy Lifestyle.  Know what a healthy weight is for you (roughly BMI <25) and aim to maintain this   Aim for 7+ servings of fruits and vegetables daily   65-80+ fluid ounces of water or unsweet tea for healthy kidneys   Limit to max 1 drink of alcohol per day; avoid smoking/tobacco   Limit animal fats in diet for cholesterol and heart health - choose grass fed whenever available   Avoid highly processed foods, and foods high in saturated/trans fats   Aim for low stress - take time to unwind and care for your mental health   Aim for 150 min of moderate intensity exercise weekly for heart health, and weights twice weekly for bone health   Aim for 7-9 hours of sleep daily   When it comes to diets, agreement about the perfect plan isnt easy to find, even among the experts. Experts at the [employer] developed an idea known as the Healthy Eating Plate. Just imagine a plate divided into logical, healthy portions.   The emphasis is on diet quality:   Load up on vegetables and fruits - one-half of your plate: Aim for color and variety, and  remember that potatoes dont count.   Go for whole grains - one-quarter of your plate: Whole wheat, barley, wheat berries, quinoa, oats, brown rice, and foods made with them. If you want pasta, go with whole wheat pasta.   Protein power - one-quarter of your plate: Fish, chicken, beans, and nuts are all healthy, versatile protein sources. Limit red meat.   The diet, however, does go beyond the plate, offering a few other suggestions.   Use healthy plant oils, such as olive, canola, soy, corn, sunflower and peanut. Check the labels, and avoid partially hydrogenated oil, which have unhealthy trans fats.   If youre thirsty, drink water. Coffee and tea are good in moderation, but skip sugary drinks and limit milk and dairy products to one or two daily servings.   The type of carbohydrate in the diet is more important than the amount. Some sources of carbohydrates, such as vegetables, fruits, whole grains, and beans-are healthier than others.   Finally, stay active  Signed, Berniece Salines, DO  08/08/2020 1:52 PM    [facility]

## 2020-08-09 DIAGNOSIS — Z79899 Other long term (current) drug therapy: Secondary | ICD-10-CM | POA: Diagnosis not present

## 2020-08-09 DIAGNOSIS — M5442 Lumbago with sciatica, left side: Secondary | ICD-10-CM | POA: Diagnosis not present

## 2020-08-09 DIAGNOSIS — M961 Postlaminectomy syndrome, not elsewhere classified: Secondary | ICD-10-CM | POA: Diagnosis not present

## 2020-08-09 DIAGNOSIS — E7800 Pure hypercholesterolemia, unspecified: Secondary | ICD-10-CM | POA: Diagnosis not present

## 2020-08-09 DIAGNOSIS — M419 Scoliosis, unspecified: Secondary | ICD-10-CM | POA: Diagnosis not present

## 2020-08-09 DIAGNOSIS — M5136 Other intervertebral disc degeneration, lumbar region: Secondary | ICD-10-CM | POA: Diagnosis not present

## 2020-09-05 DIAGNOSIS — G4700 Insomnia, unspecified: Secondary | ICD-10-CM | POA: Diagnosis not present

## 2020-09-05 DIAGNOSIS — I1310 Hypertensive heart and chronic kidney disease without heart failure, with stage 1 through stage 4 chronic kidney disease, or unspecified chronic kidney disease: Secondary | ICD-10-CM | POA: Diagnosis not present

## 2020-09-05 DIAGNOSIS — I2510 Atherosclerotic heart disease of native coronary artery without angina pectoris: Secondary | ICD-10-CM | POA: Diagnosis not present

## 2020-09-05 DIAGNOSIS — K219 Gastro-esophageal reflux disease without esophagitis: Secondary | ICD-10-CM | POA: Diagnosis not present

## 2020-09-05 DIAGNOSIS — J9811 Atelectasis: Secondary | ICD-10-CM | POA: Diagnosis not present

## 2020-09-05 DIAGNOSIS — W19XXXA Unspecified fall, initial encounter: Secondary | ICD-10-CM | POA: Diagnosis not present

## 2020-09-05 DIAGNOSIS — R7303 Prediabetes: Secondary | ICD-10-CM | POA: Diagnosis not present

## 2020-09-05 DIAGNOSIS — N182 Chronic kidney disease, stage 2 (mild): Secondary | ICD-10-CM | POA: Diagnosis not present

## 2020-09-05 DIAGNOSIS — I700 Atherosclerosis of aorta: Secondary | ICD-10-CM | POA: Diagnosis not present

## 2020-09-05 DIAGNOSIS — E785 Hyperlipidemia, unspecified: Secondary | ICD-10-CM | POA: Diagnosis not present

## 2020-09-14 DIAGNOSIS — E871 Hypo-osmolality and hyponatremia: Secondary | ICD-10-CM | POA: Diagnosis not present

## 2020-10-02 DIAGNOSIS — M961 Postlaminectomy syndrome, not elsewhere classified: Secondary | ICD-10-CM | POA: Diagnosis not present

## 2020-10-02 DIAGNOSIS — M5442 Lumbago with sciatica, left side: Secondary | ICD-10-CM | POA: Diagnosis not present

## 2020-10-02 DIAGNOSIS — G8929 Other chronic pain: Secondary | ICD-10-CM | POA: Diagnosis not present

## 2020-10-02 DIAGNOSIS — M419 Scoliosis, unspecified: Secondary | ICD-10-CM | POA: Diagnosis not present

## 2020-10-02 DIAGNOSIS — Z79899 Other long term (current) drug therapy: Secondary | ICD-10-CM | POA: Diagnosis not present

## 2020-10-05 DIAGNOSIS — Z79899 Other long term (current) drug therapy: Secondary | ICD-10-CM | POA: Diagnosis not present

## 2020-10-10 DIAGNOSIS — E871 Hypo-osmolality and hyponatremia: Secondary | ICD-10-CM | POA: Diagnosis not present

## 2020-11-02 DIAGNOSIS — M419 Scoliosis, unspecified: Secondary | ICD-10-CM | POA: Diagnosis not present

## 2020-11-02 DIAGNOSIS — R5383 Other fatigue: Secondary | ICD-10-CM | POA: Diagnosis not present

## 2020-11-02 DIAGNOSIS — E7800 Pure hypercholesterolemia, unspecified: Secondary | ICD-10-CM | POA: Diagnosis not present

## 2020-11-02 DIAGNOSIS — R300 Dysuria: Secondary | ICD-10-CM | POA: Diagnosis not present

## 2020-11-02 DIAGNOSIS — Z1159 Encounter for screening for other viral diseases: Secondary | ICD-10-CM | POA: Diagnosis not present

## 2020-11-02 DIAGNOSIS — Z79899 Other long term (current) drug therapy: Secondary | ICD-10-CM | POA: Diagnosis not present

## 2020-11-02 DIAGNOSIS — E559 Vitamin D deficiency, unspecified: Secondary | ICD-10-CM | POA: Diagnosis not present

## 2020-11-02 DIAGNOSIS — G4700 Insomnia, unspecified: Secondary | ICD-10-CM | POA: Diagnosis not present

## 2020-11-02 DIAGNOSIS — M961 Postlaminectomy syndrome, not elsewhere classified: Secondary | ICD-10-CM | POA: Diagnosis not present

## 2020-11-02 DIAGNOSIS — M1990 Unspecified osteoarthritis, unspecified site: Secondary | ICD-10-CM | POA: Diagnosis not present

## 2020-11-02 DIAGNOSIS — D539 Nutritional anemia, unspecified: Secondary | ICD-10-CM | POA: Diagnosis not present

## 2020-11-19 DIAGNOSIS — J9811 Atelectasis: Secondary | ICD-10-CM | POA: Diagnosis not present

## 2020-11-19 DIAGNOSIS — I700 Atherosclerosis of aorta: Secondary | ICD-10-CM | POA: Diagnosis not present

## 2020-11-19 DIAGNOSIS — Z9889 Other specified postprocedural states: Secondary | ICD-10-CM | POA: Diagnosis not present

## 2020-11-19 DIAGNOSIS — Z8701 Personal history of pneumonia (recurrent): Secondary | ICD-10-CM | POA: Diagnosis not present

## 2020-11-19 DIAGNOSIS — R911 Solitary pulmonary nodule: Secondary | ICD-10-CM | POA: Diagnosis not present

## 2020-11-27 DIAGNOSIS — M5136 Other intervertebral disc degeneration, lumbar region: Secondary | ICD-10-CM | POA: Diagnosis not present

## 2020-11-27 DIAGNOSIS — M62830 Muscle spasm of back: Secondary | ICD-10-CM | POA: Diagnosis not present

## 2020-11-27 DIAGNOSIS — M549 Dorsalgia, unspecified: Secondary | ICD-10-CM | POA: Diagnosis not present

## 2020-11-27 DIAGNOSIS — G8929 Other chronic pain: Secondary | ICD-10-CM | POA: Diagnosis not present

## 2020-11-27 DIAGNOSIS — Z23 Encounter for immunization: Secondary | ICD-10-CM | POA: Diagnosis not present

## 2020-11-27 DIAGNOSIS — Z79899 Other long term (current) drug therapy: Secondary | ICD-10-CM | POA: Diagnosis not present

## 2020-11-28 DIAGNOSIS — N3946 Mixed incontinence: Secondary | ICD-10-CM | POA: Diagnosis not present

## 2020-11-28 DIAGNOSIS — N390 Urinary tract infection, site not specified: Secondary | ICD-10-CM | POA: Diagnosis not present

## 2020-12-05 DIAGNOSIS — H11151 Pinguecula, right eye: Secondary | ICD-10-CM | POA: Diagnosis not present

## 2020-12-05 DIAGNOSIS — H401132 Primary open-angle glaucoma, bilateral, moderate stage: Secondary | ICD-10-CM | POA: Diagnosis not present

## 2020-12-05 DIAGNOSIS — H16223 Keratoconjunctivitis sicca, not specified as Sjogren's, bilateral: Secondary | ICD-10-CM | POA: Diagnosis not present

## 2020-12-05 DIAGNOSIS — H26492 Other secondary cataract, left eye: Secondary | ICD-10-CM | POA: Diagnosis not present

## 2020-12-05 DIAGNOSIS — H43393 Other vitreous opacities, bilateral: Secondary | ICD-10-CM | POA: Diagnosis not present

## 2020-12-05 DIAGNOSIS — H1789 Other corneal scars and opacities: Secondary | ICD-10-CM | POA: Diagnosis not present

## 2020-12-05 DIAGNOSIS — H524 Presbyopia: Secondary | ICD-10-CM | POA: Diagnosis not present

## 2020-12-05 DIAGNOSIS — Z961 Presence of intraocular lens: Secondary | ICD-10-CM | POA: Diagnosis not present

## 2020-12-06 DIAGNOSIS — R911 Solitary pulmonary nodule: Secondary | ICD-10-CM | POA: Diagnosis not present

## 2020-12-06 DIAGNOSIS — I700 Atherosclerosis of aorta: Secondary | ICD-10-CM | POA: Diagnosis not present

## 2020-12-12 DIAGNOSIS — Z0000 Encounter for general adult medical examination without abnormal findings: Secondary | ICD-10-CM | POA: Diagnosis not present

## 2020-12-12 DIAGNOSIS — E785 Hyperlipidemia, unspecified: Secondary | ICD-10-CM | POA: Diagnosis not present

## 2020-12-12 DIAGNOSIS — Z9181 History of falling: Secondary | ICD-10-CM | POA: Diagnosis not present

## 2020-12-12 DIAGNOSIS — E669 Obesity, unspecified: Secondary | ICD-10-CM | POA: Diagnosis not present

## 2020-12-12 DIAGNOSIS — Z6835 Body mass index (BMI) 35.0-35.9, adult: Secondary | ICD-10-CM | POA: Diagnosis not present

## 2020-12-12 DIAGNOSIS — Z1331 Encounter for screening for depression: Secondary | ICD-10-CM | POA: Diagnosis not present

## 2020-12-23 ENCOUNTER — Other Ambulatory Visit: Payer: Self-pay | Admitting: Cardiology

## 2020-12-27 DIAGNOSIS — Z79899 Other long term (current) drug therapy: Secondary | ICD-10-CM | POA: Diagnosis not present

## 2020-12-27 DIAGNOSIS — M419 Scoliosis, unspecified: Secondary | ICD-10-CM | POA: Diagnosis not present

## 2020-12-27 DIAGNOSIS — M5136 Other intervertebral disc degeneration, lumbar region: Secondary | ICD-10-CM | POA: Diagnosis not present

## 2020-12-27 DIAGNOSIS — M549 Dorsalgia, unspecified: Secondary | ICD-10-CM | POA: Diagnosis not present

## 2020-12-27 DIAGNOSIS — G8929 Other chronic pain: Secondary | ICD-10-CM | POA: Diagnosis not present

## 2021-01-09 ENCOUNTER — Other Ambulatory Visit: Payer: Self-pay

## 2021-01-09 ENCOUNTER — Encounter: Payer: Self-pay | Admitting: Pulmonary Disease

## 2021-01-09 ENCOUNTER — Ambulatory Visit: Payer: PPO | Admitting: Pulmonary Disease

## 2021-01-09 VITALS — BP 110/70 | HR 67 | Temp 98.3°F | Ht 64.0 in | Wt 213.0 lb

## 2021-01-09 DIAGNOSIS — R9389 Abnormal findings on diagnostic imaging of other specified body structures: Secondary | ICD-10-CM | POA: Diagnosis not present

## 2021-01-09 NOTE — Patient Instructions (Addendum)
The CT scan that we compared shows stable findings  Some scarring in the lungs which may be a result of the pneumonia you've had in the past The cyst described as been there for the last 4 years and is stable  I do not believe we need to make any changes at present   I will see you as needed  Call with significant concerns

## 2021-01-09 NOTE — Progress Notes (Signed)
Subjective:    Patient ID: Karen Castillo, female    DOB: 07/22/1940, 80 y.o.   MRN: [id number]   Patient being seen for abnormal CT scan of the chest  Recently had a CT scan of the chest showing some scarring at the base of the lung and a cyst at the base of the lung CT was performed as a follow-up for recent pneumonia  Currently denies any significant symptoms She does have an intermittent cough that she is chronically  Not really bringing up any secretions Does not feel acutely ill No fevers or chills  Denies any other associated symptoms of present Does not feel short of breath No significant hoarseness  No history of smoking  No pertinent occupational history  Has had hoarseness in the past with respiratory infections     Past Medical History:  Diagnosis Date   Abdominal pain    Acute post-operative pain 07/13/2017   Allergic rhinitis    Anemia    Arthritis    Bipolar disorder ([place])    patient denies   Cancer ([place])    SKIN    ,    LEFT BREAST    Chronic bilateral low back pain 09/16/2016   Chronic constipation    Chronic pain syndrome    Constipation    Coronary artery disease involving native coronary artery of native heart without angina pectoris 02/13/2020   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   Dry eyes    Dysuria    Elevated cholesterol    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   Gallstones    GERD (gastroesophageal reflux disease)    Glaucoma    both eyes   Gout    Hypertension    IBS (irritable bowel syndrome)    Idiopathic peripheral neuropathy 06/11/2015   Infection of lumbar spine (HCC)    Ingrown nail of great toe of right foot 05/04/2017   Insomnia    Intractable low back pain 07/13/2017   Low back pain 08/27/2017   Malignant neoplasm of left breast ([place]) 04/24/2011   Nausea without vomiting    Obesity    Obesity (BMI 30.0-34.9) 02/13/2020   Pain syndrome, chronic 11/29/2015   Paronychia of great toe, right 05/04/2017   Pericardial  cyst along right cardiophrenic angle 03/26/2010   3.6 cm   Postoperative anemia due to acute blood loss 08/29/2017   Psychophysiological insomnia 04/03/2015   Radiculopathy 05/04/2014   Radiculopathy of lumbar region 11/29/2015   Risk for falls 06/11/2015   Schizophrenia ([place])    Sleep apnea    USES CPAP    Sleep apnea, obstructive 04/03/2015   Urticaria    Vitamin deficiency    Social History   Socioeconomic History   Marital status: Widowed    Spouse name: Not on file   Number of children: 2   Years of education: Not on file   Highest education level: Not on file  Occupational History   Occupation: Retired  Tobacco Use   Smoking status: Never   Smokeless tobacco: Never  Vaping Use   Vaping Use: Never used  Substance and Sexual Activity   Alcohol use: No   Drug use: No   Sexual activity: Not on file  Other Topics Concern   Not on file  Social History Narrative   Not on file   Social Determinants of Health   Financial Resource Strain: Not on file  Food Insecurity: Not on file  Transportation Needs: Not on file  Physical Activity:  Not on file  Stress: Not on file  Social Connections: Not on file  Intimate Partner Violence: Not on file   Family History  Problem Relation Age of Onset   Diabetes Mother        had a history but not anymore   Prostate cancer Father    Stroke Sister        has a brain tumor   Colon cancer Neg Hx    Esophageal cancer Neg Hx    Stomach cancer Neg Hx    Rectal cancer Neg Hx    Review of Systems  Constitutional:  Negative for fever and unexpected weight change.  HENT:  Negative for congestion, dental problem, ear pain, nosebleeds, postnasal drip, rhinorrhea, sinus pressure, sneezing, sore throat and trouble swallowing.   Eyes:  Negative for redness and itching.  Respiratory:  Positive for cough. Negative for chest tightness, shortness of breath and wheezing.   Cardiovascular:  Positive for leg swelling. Negative for palpitations.   Gastrointestinal:  Positive for nausea. Negative for vomiting.  Genitourinary:  Negative for dysuria.  Musculoskeletal:  Negative for joint swelling.  Skin:  Negative for rash.  Allergic/Immunologic: Negative.  Negative for environmental allergies, food allergies and immunocompromised state.  Neurological:  Negative for headaches.  Hematological:  Does not bruise/bleed easily.  Psychiatric/Behavioral:  Negative for dysphoric mood. The patient is not nervous/anxious.      Objective:   Physical Exam Constitutional:      Appearance: She is obese.  HENT:     Head: Normocephalic and atraumatic.     Mouth/Throat:     Mouth: Mucous membranes are moist.  Eyes:     Pupils: Pupils are equal, round, and reactive to light.  Neck:     Vascular: No carotid bruit.  Cardiovascular:     Rate and Rhythm: Normal rate and regular rhythm.     Pulses: Normal pulses.     Heart sounds: Normal heart sounds. No murmur heard.   No friction rub.  Pulmonary:     Effort: Pulmonary effort is normal. No respiratory distress.     Breath sounds: Normal breath sounds. No stridor. No wheezing.  Musculoskeletal:     Cervical back: No rigidity or tenderness.  Lymphadenopathy:     Cervical: No cervical adenopathy.  Skin:    General: Skin is warm.  Neurological:     Mental Status: She is alert.  Psychiatric:        Mood and Affect: Mood normal.   Vitals:   01/09/21 1614  BP: 110/70  Pulse: 67  Temp: 98.3 F (36.8 C)  SpO2: 94%   CT  01/16/2019-no significant pathology noted CT scan of the chest reviewed with the patient Compared with CT scan of the chest performed in 2018 -The CT shows stable findings    Assessment & Plan:  .  Abnormal CT scan of the chest with scarring at the bases of the lung  .  Cyst at the base of the right pericardiac sleeve  .  CT is stable from 2018  .  Patient has no significant symptoms at present   .  Plan:  .  No significant intervention needed  .  No follow-up  CT needed  .  I will see patient as needed .  Encouraged to call with any significant concerns

## 2021-01-16 DIAGNOSIS — I700 Atherosclerosis of aorta: Secondary | ICD-10-CM | POA: Diagnosis not present

## 2021-01-16 DIAGNOSIS — K219 Gastro-esophageal reflux disease without esophagitis: Secondary | ICD-10-CM | POA: Diagnosis not present

## 2021-01-16 DIAGNOSIS — R7303 Prediabetes: Secondary | ICD-10-CM | POA: Diagnosis not present

## 2021-01-16 DIAGNOSIS — I1310 Hypertensive heart and chronic kidney disease without heart failure, with stage 1 through stage 4 chronic kidney disease, or unspecified chronic kidney disease: Secondary | ICD-10-CM | POA: Diagnosis not present

## 2021-01-16 DIAGNOSIS — G4700 Insomnia, unspecified: Secondary | ICD-10-CM | POA: Diagnosis not present

## 2021-01-16 DIAGNOSIS — Z79899 Other long term (current) drug therapy: Secondary | ICD-10-CM | POA: Diagnosis not present

## 2021-01-16 DIAGNOSIS — K5900 Constipation, unspecified: Secondary | ICD-10-CM | POA: Diagnosis not present

## 2021-01-16 DIAGNOSIS — I2510 Atherosclerotic heart disease of native coronary artery without angina pectoris: Secondary | ICD-10-CM | POA: Diagnosis not present

## 2021-01-16 DIAGNOSIS — E785 Hyperlipidemia, unspecified: Secondary | ICD-10-CM | POA: Diagnosis not present

## 2021-01-16 DIAGNOSIS — J9811 Atelectasis: Secondary | ICD-10-CM | POA: Diagnosis not present

## 2021-01-16 DIAGNOSIS — L659 Nonscarring hair loss, unspecified: Secondary | ICD-10-CM | POA: Diagnosis not present

## 2021-01-16 DIAGNOSIS — N182 Chronic kidney disease, stage 2 (mild): Secondary | ICD-10-CM | POA: Diagnosis not present

## 2021-01-24 DIAGNOSIS — Z0000 Encounter for general adult medical examination without abnormal findings: Secondary | ICD-10-CM | POA: Diagnosis not present

## 2021-01-24 DIAGNOSIS — M5136 Other intervertebral disc degeneration, lumbar region: Secondary | ICD-10-CM | POA: Diagnosis not present

## 2021-01-24 DIAGNOSIS — M961 Postlaminectomy syndrome, not elsewhere classified: Secondary | ICD-10-CM | POA: Diagnosis not present

## 2021-01-24 DIAGNOSIS — G8929 Other chronic pain: Secondary | ICD-10-CM | POA: Diagnosis not present

## 2021-01-24 DIAGNOSIS — M549 Dorsalgia, unspecified: Secondary | ICD-10-CM | POA: Diagnosis not present

## 2021-01-24 DIAGNOSIS — Z79899 Other long term (current) drug therapy: Secondary | ICD-10-CM | POA: Diagnosis not present

## 2021-02-21 DIAGNOSIS — Z79899 Other long term (current) drug therapy: Secondary | ICD-10-CM | POA: Diagnosis not present

## 2021-02-21 DIAGNOSIS — M961 Postlaminectomy syndrome, not elsewhere classified: Secondary | ICD-10-CM | POA: Diagnosis not present

## 2021-02-21 DIAGNOSIS — G8929 Other chronic pain: Secondary | ICD-10-CM | POA: Diagnosis not present

## 2021-02-21 DIAGNOSIS — M549 Dorsalgia, unspecified: Secondary | ICD-10-CM | POA: Diagnosis not present

## 2021-02-21 DIAGNOSIS — M62838 Other muscle spasm: Secondary | ICD-10-CM | POA: Diagnosis not present

## 2021-03-25 NOTE — Progress Notes (Incomplete)
[facility]  [address] [place],  [place]  [id number] [phone]  Clinic Day:  03/25/2021  Referring physician: Renaldo Reel, PA   This document serves as a record of services personally performed by Hosie Poisson, MD. It was created on their behalf by Curry,Lauren E, a trained medical scribe. The creation of this record is based on the scribe's personal observations and the provider's statements to them.  CHIEF COMPLAINT:  CC: History of stage IA hormone receptor positive left breast cancer  Current Treatment:  Surveillance   HISTORY OF PRESENT ILLNESS:  Karen Castillo is a 81 y.o. 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  Biopsy revealed an infiltrating ductal carcinoma, tubular type.  Estrogen and progesterone receptors were positive and her 2 Neu negative.  She underwent excisional biopsy, but not a true lumpectomy in February 2013, and never had re-excision of the margins.  Pathology revealed a 5 mm, grade 1, lesion with 1 mm margin.  Re-excision and sentinel node biopsy was recommended, however she declined further surgery.  She was placed on letrozole 2.5 mg daily, however, was unable to tolerate this.  She is intolerant to many medications.  Bilateral diagnostic mammogram in December 2015 revealed postoperative scarring the left breast, as well as a new 3 mm benign appearing nodule in the right upper outer quadrant.  She had a repeat diagnostic right mammogram in July 2016, which was stable.  Bilateral diagnostic mammogram in November did not reveal any evidence of malignancy and follow-up in 1 year was recommended.  She had osteopenia on her last DEXA in in July 2016, which revealed a T-score of -1.8 in the femur, for which she is taking calcium and vitamin-D.  She had back surgery in February 2016, which did help her back pain.  She is here for routine follow-up  and states she has been doing fairly well.  She denies any changes in her breasts. She has persistent pain in her back and legs which she rates 7/10 today.  She states she also has labs done at Dr. Manon Hilding office, and we will request those.  She had her last mammogram on January 14, 2016.  She saw Dr. Melina Copa for Gastroenterology as she was having a lot of nausea and vomiting and inability to eat.  Upper endoscopy was okay and her last colonoscopy was in 2000. 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  She is doing better with that now, and did also have an ultrasound of the abdomen, which was negative.  She had told me that she had a chest mass 6-7 years ago which was evaluated at [facility] in [facility].  She later had a PET scan which was negative but has not had a repeat CT of the chest in years.  I ordered that to be done in June to follow up on this chest mass.  The CT scan confirmed a cyst along the right cardiophrenic angle measuring 3.6 cm, which is the same measurement as the previous report.  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    INTERVAL HISTORY:  Karen Castillo is here for routine follow up and states that she has been well, other than back problems.  She has occasional back pain, and has to walk "  hunched over".  She does note some balance instability.  Annual bilateral mammogram from January was clear.  She does states that she has some difficulty with mammography.  Her  appetite is good, and she has lost 3 and 1/2 pounds since her last visit.  She denies fever, chills or other signs of infection.  She denies nausea, vomiting, bowel issues, or abdominal pain.  She denies sore throat, cough, dyspnea, or chest pain.  [name] is here for routine follow up ***. Annual mammogram from January 16th was ***.   Her  appetite is good, and she has gained/lost _ pounds since  her last visit.  She denies fever, chills or other signs of infection.  She denies nausea, vomiting, bowel issues, or abdominal pain.  She denies sore throat, cough, dyspnea, or chest pain.  REVIEW OF SYSTEMS:  Review of Systems  Constitutional: Negative.  Negative for appetite change, chills, fatigue, fever and unexpected weight change.  HENT:  Negative.    Eyes: Negative.   Respiratory: Negative.  Negative for chest tightness, cough, hemoptysis, shortness of breath and wheezing.   Cardiovascular: Negative.  Negative for chest pain, leg swelling and palpitations.  Gastrointestinal: Negative.  Negative for abdominal distention, abdominal pain, blood in stool, constipation, diarrhea, nausea and vomiting.  Endocrine: Negative.   Genitourinary: Negative.  Negative for difficulty urinating, dysuria, frequency and hematuria.   Musculoskeletal: Negative.  Negative for arthralgias, back pain, flank pain, gait problem and myalgias.  Skin: Negative.   Neurological: Negative.  Negative for dizziness, extremity weakness, gait problem, headaches, light-headedness, numbness, seizures and speech difficulty.  Hematological: Negative.   Psychiatric/Behavioral: Negative.  Negative for depression and sleep disturbance. The patient is not nervous/anxious.     VITALS:  There were no vitals taken for this visit.  Wt Readings from Last 3 Encounters:  01/09/21 213 lb (96.6 kg)  08/08/20 212 lb 9.6 oz (96.4 kg)  06/07/20 208 lb 6.4 oz (94.5 kg)    There is no height or weight on file to calculate BMI.  Performance status (ECOG): 1 - Symptomatic but completely ambulatory  PHYSICAL EXAM:  Physical Exam Constitutional:      General: She is not in acute distress.    Appearance: Normal appearance. She is normal weight.  HENT:     Head: Normocephalic and atraumatic.  Eyes:     General: No scleral icterus.    Extraocular Movements: Extraocular movements intact.     Conjunctiva/sclera: Conjunctivae normal.      Pupils: Pupils are equal, round, and reactive to light.  Cardiovascular:     Rate and Rhythm: Normal rate and regular rhythm.     Pulses: Normal pulses.     Heart sounds: Normal heart sounds. No murmur heard.   No friction rub. No gallop.  Pulmonary:     Effort: Pulmonary effort is normal. No respiratory distress.     Breath sounds: Normal breath sounds.  Abdominal:     General: Bowel sounds are normal. There is no distension.     Palpations: Abdomen is soft. There is no hepatomegaly, splenomegaly or mass.     Tenderness: There is no abdominal tenderness.  Musculoskeletal:        General: Normal range of motion.     Cervical back: Normal range of motion and neck supple.     Right lower leg: No edema.     Left lower leg: No edema.  Lymphadenopathy:     Cervical: No cervical adenopathy.  Skin:  General: Skin is warm and dry.  Neurological:     General: No focal deficit present.     Mental Status: She is alert and oriented to person, place, and time. Mental status is at baseline.  Psychiatric:        Mood and Affect: Mood normal.        Behavior: Behavior normal.        Thought Content: Thought content normal.        Judgment: Judgment normal.    LABS:   CBC Latest Ref Rng & Units 03/15/2020 07/21/2019 01/17/2019  WBC 3.4 - 10.8 x10E3/uL 8.6 6.6 8.1  Hemoglobin 11.1 - 15.9 g/dL 12.6 12.0 11.9(L)  Hematocrit 34.0 - 46.6 % 37.9 37.1 36.2  Platelets 150 - 450 x10E3/uL 266 274 245   CMP Latest Ref Rng & Units 03/15/2020 07/21/2019 01/17/2019  Glucose 65 - 99 mg/dL 117(H) 111(H) 97  BUN 8 - 27 mg/dL 16 10 9   Creatinine 0.57 - 1.00 mg/dL 1.10(H) 0.81 0.74  Sodium 134 - 144 mmol/L 135 133(L) 137  Potassium 3.5 - 5.2 mmol/L 4.7 3.7 3.9  Chloride 96 - 106 mmol/L 98 95(L) 103  CO2 20 - 29 mmol/L 23 25 24   Calcium 8.7 - 10.3 mg/dL 9.7 9.6 8.7(L)  Total Protein 6.5 - 8.1 g/dL - 7.0 6.5  Total Bilirubin 0.3 - 1.2 mg/dL - 0.8 0.6  Alkaline Phos 38 - 126 U/L - 66 82  AST 15 - 41 U/L -  18 32  ALT 0 - 44 U/L - 15 44    STUDIES:   Allergies:  Allergies  Allergen Reactions   Ciprofloxacin Swelling    Tongue swells   Cyclobenzaprine Swelling    Feet swell    Pregabalin Anaphylaxis, Shortness Of Breath, Swelling and Other (See Comments)    Dizziness also   Fluvastatin Other (See Comments)    Leg pain    Meloxicam Swelling    Feet became swollen   Metaxalone Rash and Other (See Comments)    Flushing of the skin and sore throat, too   Pramipexole Nausea And Vomiting   Pravastatin Other (See Comments)    Leg pain   Rosuvastatin Other (See Comments)    Leg pain   Sertraline Other (See Comments)    Sensitive teeth   Statins Other (See Comments)    Leg pain      Amitriptyline Other (See Comments)    Weird feeling all over   Arthrotec [Diclofenac-Misoprostol] Diarrhea   Augmentin [Amoxicillin-Pot Clavulanate] Diarrhea    Has patient had a PCN reaction causing immediate rash, facial/tongue/throat swelling, SOB or lightheadedness with hypotension: No Has patient had a PCN reaction causing severe rash involving mucus membranes or skin necrosis: No Has patient had a PCN reaction that required hospitalization: No Has patient had a PCN reaction occurring within the last 10 years: No If all of the above answers are "NO", then may proceed with Cephalosporin use.    Bromfenac Diarrhea and Nausea Only   Cephalexin Diarrhea and Nausea Only   Conj Estrog-Medroxyprogest Ace Other (See Comments)    Unsure of reaction type Couldn't tolerate   Diclofenac-Misoprostol Nausea Only and Diarrhea   Elemental Sulfur Diarrhea and Nausea Only   Erythromycin Diarrhea and Nausea Only   Esomeprazole Diarrhea   Esomeprazole Magnesium Nausea Only   Levofloxacin Other (See Comments) and Nausea Only    Stomach upset   Methadone Diarrhea   Metoclopramide Diarrhea and Nausea Only   Other Other (See  Comments)    Darvocet   Oxycodone Hives, Swelling and Rash   Pravachol [Pravastatin  Sodium] Other (See Comments)    Upper leg pain   Propoxyphene Diarrhea and Nausea Only   Rofecoxib Diarrhea and Nausea Only   Tramadol Diarrhea   Zoloft [Sertraline Hcl] Other (See Comments)    Sensitivity with teeth    Current Medications: Current Outpatient Medications  Medication Sig Dispense Refill   alendronate (FOSAMAX) 70 MG tablet Take 70 mg by mouth every Thursday. Take with a full glass of water on an empty stomach.     allopurinol (ZYLOPRIM) 100 MG tablet Take 100 mg by mouth daily.     amLODipine (NORVASC) 10 MG tablet Take 10 mg by mouth daily.     ascorbic acid (VITAMIN C) 500 MG tablet Take 1,000 mg by mouth daily.     aspirin EC 81 MG tablet Take 81 mg by mouth daily.     Biotin 10 MG TABS Take 10 mg by mouth daily.     calcium carbonate (OSCAL) 1500 (600 Ca) MG TABS tablet Take 600 mg of elemental calcium by mouth 3 (three) times daily with meals.     Cholecalciferol (VITAMIN D3) 2000 units TABS Take 1 tablet by mouth daily.     cloNIDine (CATAPRES) 0.1 MG tablet Take 0.1 mg by mouth at bedtime.     cycloSPORINE (RESTASIS) 0.05 % ophthalmic emulsion Place 1 drop into both eyes 2 (two) times daily.      fexofenadine (ALLEGRA) 180 MG tablet Take 180 mg by mouth daily.     hydrochlorothiazide (HYDRODIURIL) 25 MG tablet Take 25 mg by mouth daily.     HYDROmorphone (DILAUDID) 4 MG tablet Take by mouth. Twice a day as needed per patient     HYDROmorphone HCl (EXALGO) 8 MG TB24 Take 8 mg by mouth daily as needed.     Lactobacillus (PROBIOTIC ACIDOPHILUS PO) Take 1 tablet by mouth daily.     lansoprazole (PREVACID) 30 MG capsule Take 1 capsule (30 mg total) by mouth daily before breakfast. 30 minutes before breakfast 30 capsule 0   latanoprost (XALATAN) 0.005 % ophthalmic solution Place 1 drop into both eyes at bedtime.     linaclotide (LINZESS) 290 MCG CAPS capsule Take 1 capsule (290 mcg total) by mouth daily before breakfast. 30 capsule 11   LIVALO 2 MG TABS Take 1 tablet by  mouth at bedtime.     Magnesium 250 MG TABS Take 250 mg by mouth daily.     methocarbamol (ROBAXIN) 750 MG tablet Take 750 mg by mouth 3 (three) times daily.     nadolol (CORGARD) 40 MG tablet Take 60 mg by mouth daily.     NARCAN 4 MG/0.1ML LIQD nasal spray kit Place 4 mg into the nose. I spray in both nares PRN     nitroGLYCERIN (NITROSTAT) 0.4 MG SL tablet Place 1 tablet (0.4 mg total) under the tongue every 5 (five) minutes as needed for chest pain. 30 tablet 0   Omega-3 Fatty Acids (FISH OIL) 600 MG CAPS Take 600 mg by mouth 2 (two) times daily.     oxybutynin (DITROPAN) 5 MG tablet Take 5 mg by mouth 2 (two) times daily.     polyethylene glycol (MIRALAX / GLYCOLAX) 17 g packet Take 17 g by mouth daily as needed for mild constipation.     ranolazine (RANEXA) 500 MG 12 hr tablet TAKE 1 TABLET BY MOUTH TWICE A DAY 180 tablet 1  spironolactone (ALDACTONE) 25 MG tablet Take 25 mg by mouth daily.     timolol (BETIMOL) 0.5 % ophthalmic solution Place 1 drop into both eyes daily. In the morning     trazodone (DESYREL) 300 MG tablet Take 300 mg by mouth at bedtime.     trimethoprim (TRIMPEX) 100 MG tablet Take 100 mg by mouth daily.     valsartan (DIOVAN) 80 MG tablet Take 80 mg by mouth daily.     Wheat Dextrin (BENEFIBER) POWD Take 1 packet by mouth daily. Clear and Sugar FREE. 1 tablespoon daily     No current facility-administered medications for this visit.     ASSESSMENT & PLAN:   Assessment:   1. Stage IA left breast cancer diagnosed in October 2012,  over 9 years postop with no evidence of disease despite the fact that she had declined any further treatment after her excisional biopsy.  2. Osteopenia.  She will continue calcium 1200 mg daily and vitamin D3 2000 units daily.   3. Chronic degenerative disc disease and chronic back pain.  She has had multiple(6) surgeries.  Her last 2 surgeries were May 2nd and June 20th 2019.  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  Plan: I can see her back in 1 year with bilateral mammogram.  She understands and agrees with this plan of care.   I provided 15 minutes of face-to-face time during this this encounter and > 50% was spent counseling as documented under my assessment and plan.    Derwood Kaplan, MD [facility] AT [facility] [address] [place] [address] Dept: [phone] Dept Fax: [phone]   I, Rita Ohara, am acting as scribe for Derwood Kaplan, MD  I have reviewed this report as typed by the medical scribe, and it is complete and accurate.

## 2021-03-29 ENCOUNTER — Ambulatory Visit: Payer: PPO | Admitting: Oncology

## 2021-04-23 NOTE — Progress Notes (Signed)
[facility]  [address] [place],  [place]  [address] [phone]  Clinic Day:  04/29/2021  Referring physician: Renaldo Reel, PA   This document serves as a record of services personally performed by Hosie Poisson, MD. It was created on their behalf by Curry,Lauren E, a trained medical scribe. The creation of this record is based on the scribe's personal observations and the provider's statements to them.  CHIEF COMPLAINT:  CC: History of stage IA hormone receptor positive left breast cancer  Current Treatment:  Surveillance   HISTORY OF PRESENT ILLNESS:  Karen Castillo is a 81 y.o. 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  Biopsy revealed an infiltrating ductal carcinoma, tubular type.  Estrogen and progesterone receptors were positive and her 2 Neu negative.  She underwent excisional biopsy, but not a true lumpectomy in February 2013, and never had re-excision of the margins.  Pathology revealed a 5 mm, grade 1, lesion with 1 mm margin.  Re-excision and sentinel node biopsy was recommended, however she declined further surgery.  She was placed on letrozole 2.5 mg daily, however, was unable to tolerate this.  She is intolerant to many medications.  Bilateral diagnostic mammogram in December 2015 revealed postoperative scarring the left breast, as well as a new 3 mm benign appearing nodule in the right upper outer quadrant.  She had a repeat diagnostic right mammogram in July 2016, which was stable.  Bilateral diagnostic mammogram in November did not reveal any evidence of malignancy and follow-up in 1 year was recommended.  She had osteopenia on her last DEXA in in July 2016, which revealed a T-score of -1.8 in the femur, for which she is taking calcium and vitamin-D.  She had back surgery in February 2016, which did help her back pain.  She is here for routine follow-up  and states she has been doing fairly well.  She denies any changes in her breasts. She has persistent pain in her back and legs which she rates 7/10 today.  She states she also has labs done at Dr. Manon Hilding office, and we will request those.  She had her last mammogram on January 14, 2016.  She saw Dr. Melina Copa for Gastroenterology as she was having a lot of nausea and vomiting and inability to eat.  Upper endoscopy was okay and her last colonoscopy was in 2000. 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  She is doing better with that now, and did also have an ultrasound of the abdomen, which was negative.  She had told me that she had a chest mass 6-7 years ago which was evaluated at [facility] in [facility].  She later had a PET scan which was negative but has not had a repeat CT of the chest in years.  I ordered that to be done in June to follow up on this chest mass.  The CT scan confirmed a cyst along the right cardiophrenic angle measuring 3.6 cm, which is the same measurement as the previous report.  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    INTERVAL HISTORY:  Karen is here for annual follow up and continues to have issues with her back and has more trouble walking lately. She uses a cane or wheelchair. She  is taking hydromorphone 4 mg four times daily for pain. She also notes about 2 months ago, she was having pain of the left breast, however, this resolved after 2-3 weeks. She is unsure if this was secondary to pulled muscle or other strain. Annual mammogram from February 16th was clear. She sees a pain specialist and he did a bone density about 1 month ago, and told her it was stable. She continues alendronate, calcium and vitamin D. Her  appetite is good, and she is eating well.  She denies fever, chills or other signs of infection.  She denies nausea, vomiting,  bowel issues, or abdominal pain.  She denies sore throat, cough, dyspnea, or chest pain.  REVIEW OF SYSTEMS:  Review of Systems  Constitutional: Negative.  Negative for appetite change, chills, fatigue, fever and unexpected weight change.  HENT:  Negative.    Eyes: Negative.   Respiratory: Negative.  Negative for chest tightness, cough, hemoptysis, shortness of breath and wheezing.   Cardiovascular: Negative.  Negative for chest pain, leg swelling and palpitations.  Gastrointestinal: Negative.  Negative for abdominal distention, abdominal pain, blood in stool, constipation, diarrhea, nausea and vomiting.  Endocrine: Negative.   Genitourinary: Negative.  Negative for difficulty urinating, dysuria, frequency and hematuria.   Musculoskeletal:  Positive for back pain (chronic) and gait problem. Negative for arthralgias, flank pain and myalgias.  Skin: Negative.   Neurological:  Positive for gait problem. Negative for dizziness, extremity weakness, headaches, light-headedness, numbness, seizures and speech difficulty.  Hematological: Negative.   Psychiatric/Behavioral: Negative.  Negative for depression and sleep disturbance. The patient is not nervous/anxious.     VITALS:  There were no vitals taken for this visit.  Wt Readings from Last 3 Encounters:  01/09/21 213 lb (96.6 kg)  08/08/20 212 lb 9.6 oz (96.4 kg)  06/07/20 208 lb 6.4 oz (94.5 kg)    There is no height or weight on file to calculate BMI.  Performance status (ECOG): 1 - Symptomatic but completely ambulatory  PHYSICAL EXAM:  Physical Exam Constitutional:      General: She is not in acute distress.    Appearance: Normal appearance. She is normal weight.  HENT:     Head: Normocephalic and atraumatic.  Eyes:     General: No scleral icterus.    Extraocular Movements: Extraocular movements intact.     Conjunctiva/sclera: Conjunctivae normal.     Pupils: Pupils are equal, round, and reactive to light.  Cardiovascular:      Rate and Rhythm: Normal rate and regular rhythm.     Pulses: Normal pulses.     Heart sounds: Normal heart sounds. No murmur heard.   No friction rub. No gallop.  Pulmonary:     Effort: Pulmonary effort is normal. No respiratory distress.     Breath sounds: Normal breath sounds.  Chest:  Breasts:    Right: Normal.     Left: Normal.     Comments: Both breasts are without masses.  Abdominal:     General: Bowel sounds are normal. There is no distension.     Palpations: Abdomen is soft. There is no hepatomegaly, splenomegaly or mass.     Tenderness: There is no abdominal tenderness.  Musculoskeletal:        General: Normal range of motion.     Cervical back: Normal range of motion and neck supple.     Right lower leg: No edema.     Left lower leg: No edema.  Lymphadenopathy:  Cervical: No cervical adenopathy.  Skin:    General: Skin is warm and dry.  Neurological:     General: No focal deficit present.     Mental Status: She is alert and oriented to person, place, and time. Mental status is at baseline.  Psychiatric:        Mood and Affect: Mood normal.        Behavior: Behavior normal.        Thought Content: Thought content normal.        Judgment: Judgment normal.    LABS:   CBC Latest Ref Rng & Units 03/15/2020 07/21/2019 01/17/2019  WBC 3.4 - 10.8 x10E3/uL 8.6 6.6 8.1  Hemoglobin 11.1 - 15.9 g/dL 12.6 12.0 11.9(L)  Hematocrit 34.0 - 46.6 % 37.9 37.1 36.2  Platelets 150 - 450 x10E3/uL 266 274 245   CMP Latest Ref Rng & Units 03/15/2020 07/21/2019 01/17/2019  Glucose 65 - 99 mg/dL 117(H) 111(H) 97  BUN 8 - 27 mg/dL 16 10 9   Creatinine 0.57 - 1.00 mg/dL 1.10(H) 0.81 0.74  Sodium 134 - 144 mmol/L 135 133(L) 137  Potassium 3.5 - 5.2 mmol/L 4.7 3.7 3.9  Chloride 96 - 106 mmol/L 98 95(L) 103  CO2 20 - 29 mmol/L 23 25 24   Calcium 8.7 - 10.3 mg/dL 9.7 9.6 8.7(L)  Total Protein 6.5 - 8.1 g/dL - 7.0 6.5  Total Bilirubin 0.3 - 1.2 mg/dL - 0.8 0.6  Alkaline Phos 38 - 126 U/L - 66  82  AST 15 - 41 U/L - 18 32  ALT 0 - 44 U/L - 15 44    STUDIES:    EXAM: 04/25/2021 DIGITAL SCREENING BILATERAL MAMMOGRAM WITH TOMOSYNTHESIS AND CAD   TECHNIQUE:  Bilateral screening digital craniocaudal and mediolateral oblique  mammograms were obtained. Bilateral screening digital breast  tomosynthesis was performed. The images were evaluated with  computer-aided detection.   COMPARISON: Previous exam(s).   ACR Breast Density Category b: There are scattered areas of  fibroglandular density.   FINDINGS:  There are no findings suspicious for malignancy.   IMPRESSION:  No mammographic evidence of malignancy.   Allergies:  Allergies  Allergen Reactions   Ciprofloxacin Swelling    Tongue swells   Cyclobenzaprine Swelling    Feet swell    Pregabalin Anaphylaxis, Shortness Of Breath, Swelling and Other (See Comments)    Dizziness also   Fluvastatin Other (See Comments)    Leg pain    Meloxicam Swelling    Feet became swollen   Metaxalone Rash and Other (See Comments)    Flushing of the skin and sore throat, too   Pramipexole Nausea And Vomiting   Pravastatin Other (See Comments)    Leg pain   Rosuvastatin Other (See Comments)    Leg pain   Sertraline Other (See Comments)    Sensitive teeth   Statins Other (See Comments)    Leg pain      Amitriptyline Other (See Comments)    Weird feeling all over   Arthrotec [Diclofenac-Misoprostol] Diarrhea   Augmentin [Amoxicillin-Pot Clavulanate] Diarrhea    Has patient had a PCN reaction causing immediate rash, facial/tongue/throat swelling, SOB or lightheadedness with hypotension: No Has patient had a PCN reaction causing severe rash involving mucus membranes or skin necrosis: No Has patient had a PCN reaction that required hospitalization: No Has patient had a PCN reaction occurring within the last 10 years: No If all of the above answers are "NO", then may proceed with Cephalosporin use.  Bromfenac Diarrhea and  Nausea Only   Cephalexin Diarrhea and Nausea Only   Conj Estrog-Medroxyprogest Ace Other (See Comments)    Unsure of reaction type Couldn't tolerate   Diclofenac-Misoprostol Nausea Only and Diarrhea   Elemental Sulfur Diarrhea and Nausea Only   Erythromycin Diarrhea and Nausea Only   Esomeprazole Diarrhea   Esomeprazole Magnesium Nausea Only   Levofloxacin Other (See Comments) and Nausea Only    Stomach upset   Methadone Diarrhea   Metoclopramide Diarrhea and Nausea Only   Other Other (See Comments)    Darvocet   Oxycodone Hives, Swelling and Rash   Pravachol [Pravastatin Sodium] Other (See Comments)    Upper leg pain   Propoxyphene Diarrhea and Nausea Only   Rofecoxib Diarrhea and Nausea Only   Tramadol Diarrhea   Zoloft [Sertraline Hcl] Other (See Comments)    Sensitivity with teeth    Current Medications: Current Outpatient Medications  Medication Sig Dispense Refill   alendronate (FOSAMAX) 70 MG tablet Take 70 mg by mouth every Thursday. Take with a full glass of water on an empty stomach.     allopurinol (ZYLOPRIM) 100 MG tablet Take 100 mg by mouth daily.     amLODipine (NORVASC) 10 MG tablet Take 10 mg by mouth daily.     ascorbic acid (VITAMIN C) 500 MG tablet Take 1,000 mg by mouth daily.     aspirin EC 81 MG tablet Take 81 mg by mouth daily.     Biotin 10 MG TABS Take 10 mg by mouth daily.     calcium carbonate (OSCAL) 1500 (600 Ca) MG TABS tablet Take 600 mg of elemental calcium by mouth 3 (three) times daily with meals.     Cholecalciferol (VITAMIN D3) 2000 units TABS Take 1 tablet by mouth daily.     cloNIDine (CATAPRES) 0.1 MG tablet Take 0.1 mg by mouth at bedtime.     cycloSPORINE (RESTASIS) 0.05 % ophthalmic emulsion Place 1 drop into both eyes 2 (two) times daily.      fexofenadine (ALLEGRA) 180 MG tablet Take 180 mg by mouth daily.     hydrochlorothiazide (HYDRODIURIL) 25 MG tablet Take 25 mg by mouth daily.     HYDROmorphone (DILAUDID) 4 MG tablet Take by  mouth. Four times a day per patient report     Lactobacillus (PROBIOTIC ACIDOPHILUS PO) Take 1 tablet by mouth daily.     lansoprazole (PREVACID) 30 MG capsule Take 1 capsule (30 mg total) by mouth daily before breakfast. 30 minutes before breakfast 30 capsule 0   latanoprost (XALATAN) 0.005 % ophthalmic solution Place 1 drop into both eyes at bedtime.     linaclotide (LINZESS) 290 MCG CAPS capsule Take 1 capsule (290 mcg total) by mouth daily before breakfast. 30 capsule 11   LIVALO 2 MG TABS Take 1 tablet by mouth at bedtime.     Magnesium 250 MG TABS Take 250 mg by mouth daily.     methocarbamol (ROBAXIN) 750 MG tablet Take 750 mg by mouth 3 (three) times daily.     nadolol (CORGARD) 40 MG tablet Take 60 mg by mouth daily.     NARCAN 4 MG/0.1ML LIQD nasal spray kit Place 4 mg into the nose. I spray in both nares PRN     nitroGLYCERIN (NITROSTAT) 0.4 MG SL tablet Place 1 tablet (0.4 mg total) under the tongue every 5 (five) minutes as needed for chest pain. 30 tablet 0   Omega-3 Fatty Acids (FISH OIL) 600 MG CAPS Take  600 mg by mouth 2 (two) times daily.     oxybutynin (DITROPAN) 5 MG tablet Take 5 mg by mouth 2 (two) times daily.     polyethylene glycol (MIRALAX / GLYCOLAX) 17 g packet Take 17 g by mouth daily as needed for mild constipation.     ranolazine (RANEXA) 500 MG 12 hr tablet TAKE 1 TABLET BY MOUTH TWICE A DAY 180 tablet 1   rosuvastatin (CRESTOR) 20 MG tablet Take 20 mg by mouth at bedtime.     spironolactone (ALDACTONE) 25 MG tablet Take 25 mg by mouth daily.     spironolactone-hydrochlorothiazide (ALDACTAZIDE) 25-25 MG tablet Take 1 tablet by mouth daily.     timolol (BETIMOL) 0.5 % ophthalmic solution Place 1 drop into both eyes daily. In the morning     timolol (TIMOPTIC) 0.5 % ophthalmic solution      trazodone (DESYREL) 300 MG tablet Take 300 mg by mouth at bedtime.     trimethoprim (TRIMPEX) 100 MG tablet Take 100 mg by mouth daily.     valsartan (DIOVAN) 80 MG tablet Take  80 mg by mouth daily.     Wheat Dextrin (BENEFIBER) POWD Take 1 packet by mouth daily. Clear and Sugar FREE. 1 tablespoon daily     No current facility-administered medications for this visit.     ASSESSMENT & PLAN:   Assessment:   1. Stage IA left breast cancer diagnosed in October 2012, over 10 years postop with no evidence of disease despite the fact that she had declined any further treatment after her excisional biopsy.  2. Osteopenia.  She will continue alendronate 70 mg weekly, calcium 1200 mg daily and vitamin D3 2000 units daily.   3. Chronic degenerative disc disease and chronic back pain.  She has had multiple (6) surgeries.  Her last 2 surgeries were May 2nd and June 20th 2019.  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  Plan: I can see her back in 1 year with bilateral mammogram.  She understands and agrees with this plan of care.   I provided 15 minutes of face-to-face time during this this encounter and > 50% was spent counseling as documented under my assessment and plan.    Derwood Kaplan, MD [facility] [address] [place] [address] Dept: [phone] Dept Fax: [phone]   I, Rita Ohara, am acting as scribe for Derwood Kaplan, MD  I have reviewed this report as typed by the medical scribe, and it is complete and accurate.

## 2021-04-26 ENCOUNTER — Encounter: Payer: Self-pay | Admitting: Oncology

## 2021-04-29 ENCOUNTER — Other Ambulatory Visit: Payer: Self-pay | Admitting: Oncology

## 2021-04-29 ENCOUNTER — Inpatient Hospital Stay: Payer: PPO | Attending: Oncology | Admitting: Oncology

## 2021-04-29 ENCOUNTER — Other Ambulatory Visit: Payer: Self-pay

## 2021-04-29 VITALS — BP 170/79 | HR 113 | Temp 98.2°F | Resp 20 | Ht 64.0 in | Wt 225.0 lb

## 2021-04-29 DIAGNOSIS — Z170 Estrogen receptor positive status [ER+]: Secondary | ICD-10-CM

## 2021-04-29 DIAGNOSIS — C50912 Malignant neoplasm of unspecified site of left female breast: Secondary | ICD-10-CM | POA: Diagnosis not present

## 2021-05-08 ENCOUNTER — Encounter: Payer: Self-pay | Admitting: Oncology

## 2021-06-10 NOTE — Progress Notes (Signed)
? ?Cardiology Clinic Note  ? ?Patient Name: Karen Castillo ?Date of Encounter: 06/12/2021 ? ?Primary Care Provider:  Nicoletta Dress, MD ?Primary Cardiologist:  Berniece Salines, DO ? ?Patient Profile  ?  ?Karen Castillo 81 year old female presents the clinic today for evaluation of her chest pain, shortness of breath, and fatigue. ? ?Past Medical History  ?  ?Past Medical History:  ?Diagnosis Date  ? Abdominal pain   ? Acute post-operative pain 07/13/2017  ? Allergic rhinitis   ? Anemia   ? Arthritis   ? Bipolar disorder ([place])   ? patient denies  ? Cancer [facility])   ? SKIN    ,    LEFT BREAST   ? Chronic bilateral low back pain 09/16/2016  ? Chronic constipation   ? Chronic pain syndrome   ? Constipation   ? Coronary artery disease involving native coronary artery of native heart without angina pectoris 02/13/2020  ? 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 Dry eyes   ? Dysuria   ? Elevated cholesterol   ? 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 Gallstones   ? GERD (gastroesophageal reflux disease)   ? Glaucoma   ? both eyes  ? Gout   ? Hypertension   ? IBS (irritable bowel syndrome)   ? Idiopathic peripheral neuropathy 06/11/2015  ? 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 Ingrown nail of great toe of right foot 05/04/2017  ? Insomnia   ? Intractable low back pain 07/13/2017  ? Low back pain 08/27/2017  ? Malignant neoplasm of left breast ([place]) 04/24/2011  ? Nausea without vomiting   ? Obesity   ? Obesity (BMI 30.0-34.9) 02/13/2020  ? Pain syndrome, chronic 11/29/2015  ? Paronychia of great toe, right 05/04/2017  ? 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/26/2010  ? 3.6 cm  ? Postoperative anemia due to acute blood loss 08/29/2017  ? Psychophysiological insomnia 04/03/2015  ? Radiculopathy 05/04/2014  ? Radiculopathy of lumbar region 11/29/2015  ? Risk for falls 06/11/2015  ? Schizophrenia ([place])   ? Sleep apnea   ? USES CPAP   ? Sleep apnea, obstructive 04/03/2015  ? Urticaria   ? Vitamin deficiency   ? ?Past Surgical  History:  ?Procedure Laterality Date  ? ABDOMINAL EXPOSURE N/A 05/04/2014  ? Procedure: ABDOMINAL EXPOSURE;  Surgeon: Serafina Mitchell, MD;  Location: [facility];  Service: Vascular;  Laterality: N/A;  ? ANTERIOR LUMBAR FUSION N/A 05/04/2014  ? Procedure: ANTERIOR LUMBAR FUSION 1 LEVEL;  Surgeon: Sinclair Ship, MD;  Location: [facility];  Service: Orthopedics;  Laterality: N/A;  Lumbar 5-sacrum 1 anterior lumbar interbody fusion with instrumentation and allograft  ? ANTERIOR LUMBAR FUSION  07/2017  ? ANTERIOR LUMBAR FUSION  08/2017  ? BACK SURGERY    ? 2010 ,2015LUMB FUSION, 06/2013 [facility] FUSION  ? BACK SURGERY  07/2017  ? Lumbar  ? BREAST BIOPSY  2013  ? pt said it was positive   ? BREAST SURGERY    ? LEFT BRREAST BX  ? CARPAL TUNNEL RELEASE    ? 1994 RT  ? CARPAL TUNNEL RELEASE Bilateral   ? CATARACT EXTRACTION, BILATERAL  2017  ? CHOLECYSTECTOMY    ? 2004  ? COLONOSCOPY W/ BIOPSIES    ? Colonoscopy [id number], 2009, 2011  ? ERCP    ? 2009  ? ESOPHAGOGASTRODUODENOSCOPY  08/09/2017  ? Small hiatal hernia. Mild gastritis. Incidenta; duodenal diverticulum. Incidental gastric polyp.   ? LEEP    ?  1992 and 1994  ? SKIN CANCER EXCISION    ? X 4   ? thorocolumbar fusion  06/2013  ? TUBAL LIGATION    ? ? ?Allergies ? ?Allergies  ?Allergen Reactions  ? Ciprofloxacin Swelling  ?  Tongue swells  ? Cyclobenzaprine Swelling  ?  Feet swell ?  ? Pregabalin Anaphylaxis, Shortness Of Breath, Swelling and Other (See Comments)  ?  Dizziness also  ? Fluvastatin Other (See Comments)  ?  Leg pain ?  ? Meloxicam Swelling  ?  Feet became swollen  ? Metaxalone Rash and Other (See Comments)  ?  Flushing of the skin and sore throat, too  ? Pramipexole Nausea And Vomiting  ? Pravastatin Other (See Comments)  ?  Leg pain  ? Rosuvastatin Other (See Comments)  ?  Leg pain  ? Sertraline Other (See Comments)  ?  Sensitive teeth  ? Statins Other (See Comments)  ?  Leg pain  ? ?  ? Amitriptyline Other (See Comments)  ?  Weird feeling all over  ?  Arthrotec [Diclofenac-Misoprostol] Diarrhea  ? Augmentin [Amoxicillin-Pot Clavulanate] Diarrhea  ?  Has patient had a PCN reaction causing immediate rash, facial/tongue/throat swelling, SOB or lightheadedness with hypotension: No ?Has patient had a PCN reaction causing severe rash involving mucus membranes or skin necrosis: No ?Has patient had a PCN reaction that required hospitalization: No ?Has patient had a PCN reaction occurring within the last 10 years: No ?If all of the above answers are "NO", then may proceed with Cephalosporin use. ?  ? Bromfenac Diarrhea and Nausea Only  ? Cephalexin Diarrhea and Nausea Only  ? Conj Estrog-Medroxyprogest Ace Other (See Comments)  ?  Unsure of reaction type ?Couldn't tolerate  ? Diclofenac-Misoprostol Nausea Only and Diarrhea  ? Elemental Sulfur Diarrhea and Nausea Only  ? Erythromycin Diarrhea and Nausea Only  ? Esomeprazole Diarrhea  ? Esomeprazole Magnesium Nausea Only  ? Levofloxacin Other (See Comments) and Nausea Only  ?  Stomach upset  ? Methadone Diarrhea  ? Metoclopramide Diarrhea and Nausea Only  ? Other Other (See Comments)  ?  Darvocet  ? Oxycodone Hives, Swelling and Rash  ? Pravachol [Pravastatin Sodium] Other (See Comments)  ?  Upper leg pain  ? Propoxyphene Diarrhea and Nausea Only  ? Rofecoxib Diarrhea and Nausea Only  ? Tramadol Diarrhea  ? Zoloft [Sertraline Hcl] Other (See Comments)  ?  Sensitivity with teeth  ? ? ?History of Present Illness  ?  ?Karen Castillo has a PMH of coronary artery disease shown on CTA, HTN, HLD, obesity, and OSA.  PMH also includes schizophrenia, insomnia, chronic pain syndrome, bipolar disorder, anemia, and gait disturbance. ? ?She was seen by Dr.Tobb in follow-up on 08/08/2020.  She continued to be stable from a cardiac standpoint.  She wanted to discuss her medications.  Her EKG showed sinus rhythm 63 bpm.  Her EKG 11/20 showed an LVEF of 65-70%, mildly increased LVH, mild aortic annular calcification, moderate mitral  annular calcification, mild mitral valve regurgitation. ? ?She presents to the clinic today for follow-up evaluation states she has noticed some increased fatigue and weakness.  She is having difficulty with daily activities.  She reports that she has had 6 back surgeries with the first 1 and 2010 and the last one in 2019.  She does not have a formal exercise routine.  She does not feel like she is retaining fluid.  We reviewed her coronary CTA and she expressed understanding.  She describes  chest discomfort at rest which is somewhat atypical and left arm pain which appears to be musculoskeletal in nature.  I will order a echocardiogram, CBC, BMP, have her increase her physical activity as tolerated, and follow-up in 1-2 months. ? ?Today she denies chest pain, shortness of breath, lower extremity edema, fatigue, palpitations, melena, hematuria, hemoptysis, diaphoresis, weakness, presyncope, syncope, orthopnea, and PND. ? ? ?Home Medications  ?  ?Prior to Admission medications   ?Medication Sig Start Date End Date Taking? Authorizing Provider  ?alendronate (FOSAMAX) 70 MG tablet Take 70 mg by mouth every Thursday. Take with a full glass of water on an empty stomach.    [provider]  ?allopurinol (ZYLOPRIM) 100 MG tablet Take 100 mg by mouth daily.    [provider]  ?amLODipine (NORVASC) 10 MG tablet Take 10 mg by mouth daily. 05/04/19   [provider]  ?ascorbic acid (VITAMIN C) 500 MG tablet Take 1,000 mg by mouth daily.    [provider]  ?aspirin EC 81 MG tablet Take 81 mg by mouth daily.    [provider]  ?Biotin 10 MG TABS Take 10 mg by mouth daily.    [provider]  ?calcium carbonate (OSCAL) 1500 (600 Ca) MG TABS tablet Take 600 mg of elemental calcium by mouth 3 (three) times daily with meals.    [provider]  ?Cholecalciferol (VITAMIN D3) 2000 units TABS Take 1 tablet by mouth daily.    [provider]  ?cloNIDine (CATAPRES)  0.1 MG tablet Take 0.1 mg by mouth at bedtime.    [provider]  ?cycloSPORINE (RESTASIS) 0.05 % ophthalmic emulsion Place 1 drop into both eyes 2 (two) times daily.     [provider]  ?fexo

## 2021-06-12 ENCOUNTER — Ambulatory Visit: Payer: PPO | Admitting: General Practice

## 2021-06-12 ENCOUNTER — Encounter: Payer: Self-pay | Admitting: General Practice

## 2021-06-12 VITALS — BP 100/48 | HR 61 | Ht 64.0 in | Wt 200.6 lb

## 2021-06-12 DIAGNOSIS — R0609 Other forms of dyspnea: Secondary | ICD-10-CM

## 2021-06-12 DIAGNOSIS — I2510 Atherosclerotic heart disease of native coronary artery without angina pectoris: Secondary | ICD-10-CM | POA: Diagnosis not present

## 2021-06-12 DIAGNOSIS — E782 Mixed hyperlipidemia: Secondary | ICD-10-CM

## 2021-06-12 DIAGNOSIS — I10 Essential (primary) hypertension: Secondary | ICD-10-CM

## 2021-06-12 DIAGNOSIS — G4733 Obstructive sleep apnea (adult) (pediatric): Secondary | ICD-10-CM

## 2021-06-12 DIAGNOSIS — E669 Obesity, unspecified: Secondary | ICD-10-CM

## 2021-06-12 NOTE — Patient Instructions (Signed)
Medication Instructions:  ?The current medical regimen is effective;  continue present plan and medications as directed. Please refer to the Current Medication list given to you today.  ? ?*If you need a refill on your cardiac medications before your next appointment, please call your pharmacy* ? ?Lab Work: ?CBC AND BMET TODAY ?If you have labs (blood work) drawn today and your tests are completely normal, you will receive your results only by:  [place] (if you have MyChart) OR A paper copy in the mail.  If you have any lab test that is abnormal or we need to change your treatment, we will call you to review the results. You may go to any [facility] that is convenient for you however, we do have a lab in our office that is able to assist you. You DO NOT need an appointment for our lab. The lab is open 8:00am and closes at 4:00pm. Lunch 12:45 - 1:45pm. ? ?Testing/Procedures: ?Echocardiogram - Your physician has requested that you have an echocardiogram. Echocardiography is a painless test that uses sound waves to create images of your heart. It provides your doctor with information about the size and shape of your heart and how well your heart?s chambers and valves are working. This procedure takes approximately one hour. There are no restrictions for this procedure. This will be performed at either our [facility] location - [address], [place] location [facility] 2nd floor. ? ? ?Special Instructions ?PLEASE READ AND FOLLOW SALTY 6-ATTACHED-1,800 mg daily ? ?PLEASE INCREASE PHYSICAL ACTIVITY AS TOLERATED ? ?Follow-Up: ?Your next appointment:  1-2 month(s) In Person with Berniece Salines, DO  or Coletta Memos, FNP     ? ?At [facility], you and your health needs are our priority.  As part of our continuing mission to provide you with exceptional heart care, we have created designated Provider Care Teams.  These Care Teams include your primary Cardiologist (physician) and Advanced  Practice Providers (APPs -  Physician Assistants and Nurse Practitioners) who all work together to provide you with the care you need, when you need it. ? ?We recommend signing up for the patient portal called "MyChart".  Sign up information is provided on this After Visit Summary.  MyChart is used to connect with patients for Virtual Visits (Telemedicine).  Patients are able to view lab/test results, encounter notes, upcoming appointments, etc.  Non-urgent messages can be sent to your provider as well.   ?To learn more about what you can do with MyChart, go to [email or web address].   ? ? ? ? ?  ? ? ? ? ?

## 2021-06-13 ENCOUNTER — Other Ambulatory Visit: Payer: Self-pay

## 2021-06-13 DIAGNOSIS — I2510 Atherosclerotic heart disease of native coronary artery without angina pectoris: Secondary | ICD-10-CM

## 2021-06-13 DIAGNOSIS — I10 Essential (primary) hypertension: Secondary | ICD-10-CM

## 2021-06-13 DIAGNOSIS — R0609 Other forms of dyspnea: Secondary | ICD-10-CM

## 2021-06-13 DIAGNOSIS — Z79899 Other long term (current) drug therapy: Secondary | ICD-10-CM

## 2021-06-13 LAB — BASIC METABOLIC PANEL
BUN/Creatinine Ratio: 15 (ref 12–28)
BUN: 14 mg/dL (ref 8–27)
CO2: 20 mmol/L (ref 20–29)
Calcium: 9.7 mg/dL (ref 8.7–10.3)
Chloride: 93 mmol/L — ABNORMAL LOW (ref 96–106)
Creatinine, Ser: 0.94 mg/dL (ref 0.57–1.00)
Glucose: 121 mg/dL — ABNORMAL HIGH (ref 70–99)
Potassium: 4 mmol/L (ref 3.5–5.2)
Sodium: 132 mmol/L — ABNORMAL LOW (ref 134–144)
eGFR: 62 mL/min/{1.73_m2} (ref >59)

## 2021-06-13 LAB — CBC
Hematocrit: 38.5 % (ref 34.0–46.6)
Hemoglobin: 12.7 g/dL (ref 11.1–15.9)
MCH: 30.8 pg (ref 26.6–33.0)
MCHC: 33 g/dL (ref 31.5–35.7)
MCV: 93 fL (ref 79–97)
Platelets: 309 10*3/uL (ref 150–450)
RBC: 4.13 x10E6/uL (ref 3.77–5.28)
RDW: 11.8 % (ref 11.7–15.4)
WBC: 7.2 10*3/uL (ref 3.4–10.8)

## 2021-06-18 ENCOUNTER — Ambulatory Visit (INDEPENDENT_AMBULATORY_CARE_PROVIDER_SITE_OTHER): Payer: PPO

## 2021-06-18 DIAGNOSIS — R0609 Other forms of dyspnea: Secondary | ICD-10-CM

## 2021-06-19 LAB — BASIC METABOLIC PANEL
BUN/Creatinine Ratio: 13 (ref 12–28)
BUN: 11 mg/dL (ref 8–27)
CO2: 26 mmol/L (ref 20–29)
Calcium: 9.8 mg/dL (ref 8.7–10.3)
Chloride: 96 mmol/L (ref 96–106)
Creatinine, Ser: 0.82 mg/dL (ref 0.57–1.00)
Glucose: 104 mg/dL — ABNORMAL HIGH (ref 70–99)
Potassium: 4.8 mmol/L (ref 3.5–5.2)
Sodium: 136 mmol/L (ref 134–144)
eGFR: 73 mL/min/{1.73_m2} (ref >59)

## 2021-06-21 ENCOUNTER — Other Ambulatory Visit: Payer: Self-pay

## 2021-06-21 DIAGNOSIS — Z79899 Other long term (current) drug therapy: Secondary | ICD-10-CM

## 2021-06-21 DIAGNOSIS — R0609 Other forms of dyspnea: Secondary | ICD-10-CM

## 2021-06-21 LAB — ECHOCARDIOGRAM COMPLETE
Area-P 1/2: 2.73 cm2
S' Lateral: 2.95 cm

## 2021-06-21 MED ORDER — FUROSEMIDE 20 MG PO TABS: 20.0000 mg | ORAL_TABLET | Freq: Every day | ORAL | 1 refills | Status: DC

## 2021-07-04 ENCOUNTER — Other Ambulatory Visit: Payer: Self-pay

## 2021-07-04 ENCOUNTER — Encounter (HOSPITAL_COMMUNITY): Payer: Self-pay

## 2021-07-04 ENCOUNTER — Emergency Department (HOSPITAL_COMMUNITY): Payer: PPO

## 2021-07-04 ENCOUNTER — Emergency Department (HOSPITAL_COMMUNITY)
Admission: EM | Admit: 2021-07-04 | Discharge: 2021-07-04 | Disposition: A | Discharge status: Home / Self Care | Payer: PPO | Attending: Emergency Medicine | Admitting: Emergency Medicine

## 2021-07-04 DIAGNOSIS — E871 Hypo-osmolality and hyponatremia: Secondary | ICD-10-CM | POA: Insufficient documentation

## 2021-07-04 DIAGNOSIS — D649 Anemia, unspecified: Secondary | ICD-10-CM | POA: Insufficient documentation

## 2021-07-04 DIAGNOSIS — R0789 Other chest pain: Secondary | ICD-10-CM | POA: Diagnosis present

## 2021-07-04 DIAGNOSIS — F1193 Opioid use, unspecified with withdrawal: Secondary | ICD-10-CM | POA: Diagnosis not present

## 2021-07-04 DIAGNOSIS — F419 Anxiety disorder, unspecified: Secondary | ICD-10-CM | POA: Diagnosis not present

## 2021-07-04 DIAGNOSIS — M791 Myalgia, unspecified site: Secondary | ICD-10-CM | POA: Diagnosis not present

## 2021-07-04 LAB — LIPASE, BLOOD: Lipase: 39 U/L (ref 11–51)

## 2021-07-04 LAB — COMPREHENSIVE METABOLIC PANEL
ALT: 94 U/L — ABNORMAL HIGH (ref 0–44)
AST: 243 U/L — ABNORMAL HIGH (ref 15–41)
Albumin: 3.5 g/dL (ref 3.5–5.0)
Alkaline Phosphatase: 118 U/L (ref 38–126)
Anion gap: 6 (ref 5–15)
BUN: 14 mg/dL (ref 8–23)
CO2: 26 mmol/L (ref 22–32)
Calcium: 9 mg/dL (ref 8.9–10.3)
Chloride: 99 mmol/L (ref 98–111)
Creatinine, Ser: 0.68 mg/dL (ref 0.44–1.00)
GFR, Estimated: >60 mL/min (ref >60)
Glucose, Bld: 128 mg/dL — ABNORMAL HIGH (ref 70–99)
Potassium: 3.4 mmol/L — ABNORMAL LOW (ref 3.5–5.1)
Sodium: 131 mmol/L — ABNORMAL LOW (ref 135–145)
Total Bilirubin: 0.9 mg/dL (ref 0.3–1.2)
Total Protein: 6.7 g/dL (ref 6.5–8.1)

## 2021-07-04 LAB — TROPONIN I (HIGH SENSITIVITY)
Troponin I (High Sensitivity): 5 ng/L (ref <18)
Troponin I (High Sensitivity): 6 ng/L (ref <18)

## 2021-07-04 LAB — URINALYSIS, ROUTINE W REFLEX MICROSCOPIC
Bilirubin Urine: NEGATIVE
Glucose, UA: NEGATIVE mg/dL
Hgb urine dipstick: NEGATIVE
Ketones, ur: NEGATIVE mg/dL
Leukocytes,Ua: NEGATIVE
Nitrite: NEGATIVE
Protein, ur: NEGATIVE mg/dL
Specific Gravity, Urine: 1.011 (ref 1.005–1.030)
pH: 8 (ref 5.0–8.0)

## 2021-07-04 LAB — CBC WITH DIFFERENTIAL/PLATELET
Abs Immature Granulocytes: 0.03 10*3/uL (ref 0.00–0.07)
Basophils Absolute: 0 10*3/uL (ref 0.0–0.1)
Basophils Relative: 0 %
Eosinophils Absolute: 0.1 10*3/uL (ref 0.0–0.5)
Eosinophils Relative: 1 %
HCT: 34.9 % — ABNORMAL LOW (ref 36.0–46.0)
Hemoglobin: 11.9 g/dL — ABNORMAL LOW (ref 12.0–15.0)
Immature Granulocytes: 0 %
Lymphocytes Relative: 16 %
Lymphs Abs: 1.4 10*3/uL (ref 0.7–4.0)
MCH: 31.1 pg (ref 26.0–34.0)
MCHC: 34.1 g/dL (ref 30.0–36.0)
MCV: 91.1 fL (ref 80.0–100.0)
Monocytes Absolute: 0.7 10*3/uL (ref 0.1–1.0)
Monocytes Relative: 8 %
Neutro Abs: 6.6 10*3/uL (ref 1.7–7.7)
Neutrophils Relative %: 75 %
Platelets: 231 10*3/uL (ref 150–400)
RBC: 3.83 MIL/uL — ABNORMAL LOW (ref 3.87–5.11)
RDW: 12.8 % (ref 11.5–15.5)
WBC: 8.9 10*3/uL (ref 4.0–10.5)
nRBC: 0 % (ref 0.0–0.2)

## 2021-07-04 LAB — CK: Total CK: 235 U/L — ABNORMAL HIGH (ref 38–234)

## 2021-07-04 MED ORDER — METHOCARBAMOL 500 MG PO TABS
750.0000 mg | ORAL_TABLET | Freq: Once | ORAL | Status: AC
  Administered 2021-07-04: 750 mg via ORAL
  Filled 2021-07-04: qty 2

## 2021-07-04 MED ORDER — HYDROMORPHONE HCL 4 MG PO TABS: 4.0000 mg | ORAL_TABLET | Freq: Four times a day (QID) | ORAL | 0 refills | Status: AC | PRN

## 2021-07-04 MED ORDER — HYDROMORPHONE HCL 1 MG/ML IJ SOLN
1.0000 mg | Freq: Once | INTRAMUSCULAR | Status: AC
  Administered 2021-07-04: 1 mg via INTRAVENOUS
  Filled 2021-07-04: qty 1

## 2021-07-04 MED ORDER — METHOCARBAMOL 750 MG PO TABS: 750.0000 mg | ORAL_TABLET | Freq: Three times a day (TID) | ORAL | 0 refills | Status: AC

## 2021-07-04 NOTE — Discharge Instructions (Addendum)
As we discussed today I suspect that most your symptoms are caused by withdrawing off of your opioids. ?I have given you some information to read about this. ? ?Today you received medications that may make you sleepy or impair your ability to make decisions.  For the next 24 hours please do not drive, operate heavy machinery, care for a small child with out another adult present, or perform any activities that may cause harm to you or someone else if you were to fall asleep or be impaired.  ? ?You are being prescribed a medication which may make you sleepy. Please follow up of listed precautions for at least 24 hours after taking one dose. ? ? ?

## 2021-07-04 NOTE — ED Triage Notes (Signed)
BIB EMS from home for generalized pain takes dilaudid '4mg'$  QID and Robaxin out ffof med for 4 days ?

## 2021-07-04 NOTE — ED Notes (Addendum)
error 

## 2021-07-04 NOTE — ED Provider Notes (Signed)
?[facility] DEPT ?Provider Note ? ? ?CSN: [id number] ?Arrival date & time: 07/04/21  8182 ? ?  ? ?History ? ?Chief Complaint  ?Patient presents with  ? Generalized Body Aches  ? ? ?Karen Castillo is a 81 y.o. female who presents today for evaluation of body aches. ?She states that she ran out of her home narcotic medications about 4 days ago.  She also ran out of her Robaxin.  She states she has tried to call her doctor multiple times and has not been able to get refills. ?She states that she has had chills, body pain, hot flashes, chest pressure.  Her chest pressure is in the middle of her chest and does not radiate or move.  She reports she has had diarrhea however is unsure if that is related to the Levaquin that she states she is currently on for UTI. ? ?She has never gone through opioid withdrawal before per her report. ? ?HPI ? ?  ? ?Home Medications ?Prior to Admission medications   ?Medication Sig Start Date End Date Taking? Authorizing Provider  ?alendronate (FOSAMAX) 70 MG tablet Take 70 mg by mouth every Thursday. Take with a full glass of water on an empty stomach.   Yes [provider]  ?allopurinol (ZYLOPRIM) 100 MG tablet Take 100 mg by mouth daily.   Yes [provider]  ?amLODipine (NORVASC) 10 MG tablet Take 10 mg by mouth daily. 05/04/19  Yes [provider]  ?aspirin EC 81 MG tablet Take 81 mg by mouth daily.   Yes [provider]  ?Biotin 10 MG TABS Take 10 mg by mouth daily.   Yes [provider]  ?calcium carbonate (OSCAL) 1500 (600 Ca) MG TABS tablet Take 600 mg of elemental calcium by mouth 3 (three) times daily with meals.   Yes [provider]  ?Cholecalciferol (VITAMIN D3) 2000 units TABS Take 1 tablet by mouth daily.   Yes [provider]  ?cloNIDine (CATAPRES) 0.1 MG tablet Take 0.1 mg by mouth at bedtime.   Yes [provider]  ?D-Mannose 500 MG CAPS Take 500 mg by mouth daily.    Yes [provider]  ?furosemide (LASIX) 20 MG tablet Take 1 tablet (20 mg total) by mouth daily. 06/21/21 09/19/21 Yes Loel Dubonnet, NP  ?lansoprazole (PREVACID) 30 MG capsule Take 1 capsule (30 mg total) by mouth daily before breakfast. 30 minutes before breakfast 01/17/19  Yes Alekh, Kshitiz, MD  ?latanoprost (XALATAN) 0.005 % ophthalmic solution Place 1 drop into both eyes at bedtime.   Yes [provider]  ?levofloxacin (LEVAQUIN) 250 MG tablet Take 250 mg by mouth daily. 06/26/21  Yes [provider]  ?linaclotide Rolan Lipa) 290 MCG CAPS capsule Take 1 capsule (290 mcg total) by mouth daily before breakfast. 11/27/17  Yes Jackquline Denmark, MD  ?Magnesium 250 MG TABS Take 250 mg by mouth daily.   Yes [provider]  ?nadolol (CORGARD) 40 MG tablet Take 60 mg by mouth daily.   Yes [provider]  ?Omega-3 Fatty Acids (FISH OIL) 600 MG CAPS Take 600 mg by mouth 2 (two) times daily.   Yes [provider]  ?oxybutynin (DITROPAN) 5 MG tablet Take 5 mg by mouth 2 (two) times daily.   Yes [provider]  ?polyethylene glycol (MIRALAX / GLYCOLAX) 17 g packet Take 17 g by mouth daily as needed for mild constipation. ?Patient taking differently: Take 17 g by mouth every other day. 01/17/19  Yes Aline August, MD  ?ranolazine (RANEXA) 500 MG 12 hr tablet TAKE 1 TABLET BY MOUTH TWICE A DAY ?Patient taking differently: Take 500 mg by mouth 2 (two) times daily. 12/24/20  Yes Tobb, Kardie, DO  ?rosuvastatin (CRESTOR) 20 MG tablet Take 20 mg by mouth at bedtime. 04/16/21  Yes [provider]  ?spironolactone (ALDACTONE) 25 MG tablet Take 25 mg by mouth daily.   Yes [provider]  ?spironolactone-hydrochlorothiazide (ALDACTAZIDE) 25-25 MG tablet Take 1 tablet by mouth daily. 01/17/21  Yes [provider]  ?timolol (BETIMOL) 0.5 % ophthalmic solution Place 1 drop into both eyes daily. In the morning   Yes [provider]  ?trazodone  (DESYREL) 300 MG tablet Take 300 mg by mouth at bedtime. 07/10/19  Yes [provider]  ?valsartan (DIOVAN) 80 MG tablet Take 80 mg by mouth daily.   Yes [provider]  ?Wheat Dextrin (BENEFIBER) POWD Take 1 packet by mouth daily. Clear and Sugar FREE. 1 tablespoon daily   Yes [provider]  ?HYDROmorphone (DILAUDID) 4 MG tablet Take 1 tablet (4 mg total) by mouth every 6 (six) hours as needed for severe pain or moderate pain. Four times a day per patient report 07/04/21   Lorin Glass, PA-C  ?methocarbamol (ROBAXIN) 750 MG tablet Take 1 tablet (750 mg total) by mouth 3 (three) times daily. 07/04/21   Lorin Glass, PA-C  ?NARCAN 4 MG/0.1ML LIQD nasal spray kit Place 4 mg into the nose. I spray in both nares PRN ?Patient not taking: Reported on 07/04/2021 07/08/19   [provider]  ?nitroGLYCERIN (NITROSTAT) 0.4 MG SL tablet Place 1 tablet (0.4 mg total) under the tongue every 5 (five) minutes as needed for chest pain. ?Patient not taking: Reported on 07/04/2021 01/17/19   Aline August, MD  ?trimethoprim (TRIMPEX) 100 MG tablet Take 100 mg by mouth daily. ?Patient not taking: Reported on 07/04/2021    [provider]  ?   ? ?Allergies    ?Ciprofloxacin, Cyclobenzaprine, Pregabalin, Fluvastatin, Meloxicam, Metaxalone, Pramipexole, Pravastatin, Sertraline, Macrobid [nitrofurantoin], Amitriptyline, Arthrotec [diclofenac-misoprostol], Augmentin [amoxicillin-pot clavulanate], Bromfenac, Cephalexin, Conj estrog-medroxyprogest ace, Elemental sulfur, Erythromycin, Esomeprazole magnesium, Levofloxacin, Methadone, Metoclopramide, Other, Oxycodone, Pravachol [pravastatin sodium], Rofecoxib, Tramadol, and Zoloft [sertraline hcl]   ? ?Review of Systems   ?Review of Systems ?See above ?Physical Exam ?Updated Vital Signs ?BP 121/60   Pulse 64   Temp 97.6 ?F (36.4 ?C) (Oral)   Resp 14   Ht 5' 4"  (1.626 m)   Wt 91.2 kg   SpO2 90%   BMI 34.50 kg/m?  ?Physical  Exam ?Vitals and nursing note reviewed.  ?Constitutional:   ?   Comments: Appears uncomfortable, is constantly moving trying to get comfortable.  Mild flushing of face ?  ?HENT:  ?   Head: Normocephalic and atraumatic.  ?Eyes:  ?   General: No scleral icterus.    ?   Right eye: No discharge.     ?   Left eye: No discharge.  ?   Conjunctiva/sclera: Conjunctivae normal.  ?Cardiovascular:  ?   Rate and Rhythm: Normal rate and regular rhythm.  ?   Pulses: Normal pulses.  ?   Heart sounds: Normal heart sounds.  ?Pulmonary:  ?   Effort: Pulmonary effort is normal. No respiratory distress.  ?   Breath sounds: Normal breath sounds. No stridor.  ?Abdominal:  ?   General: There is no distension.  ?   Tenderness: There is no abdominal tenderness. There  is no right CVA tenderness, left CVA tenderness or guarding.  ?Musculoskeletal:     ?   General: No deformity.  ?   Cervical back: Normal range of motion and neck supple.  ?   Comments: Diffuse and generalized tenderness to palpation through entire back including midline and paraspinal muscles through both thoracic and lumbar regions.  No crepitus or deformity palpated.  ?Skin: ?   General: Skin is warm and dry.  ?Neurological:  ?   Mental Status: She is alert.  ?   Motor: No abnormal muscle tone.  ?   Comments: Patient is awake and alert.  Speech is not slurred.  Answers questions appropriately without difficulty.  Sensation intact to light touch to bilateral arms and legs.   ?Psychiatric:  ?   Comments: Mildly anxious  ? ? ?ED Results / Procedures / Treatments   ?Labs ?(all labs ordered are listed, but only abnormal results are displayed) ?Labs Reviewed  ?COMPREHENSIVE METABOLIC PANEL - Abnormal; Notable for the following components:  ?    Result Value  ? Sodium 131 (*)   ? Potassium 3.4 (*)   ? Glucose, Bld 128 (*)   ? AST 243 (*)   ? ALT 94 (*)   ? All other components within normal limits  ?CBC WITH DIFFERENTIAL/PLATELET - Abnormal; Notable for the following components:  ?  RBC 3.83 (*)   ? Hemoglobin 11.9 (*)   ? HCT 34.9 (*)   ? All other components within normal limits  ?CK - Abnormal; Notable for the following components:  ? Total CK 235 (*)   ? All other components within normal lim

## 2021-07-12 LAB — BASIC METABOLIC PANEL
BUN/Creatinine Ratio: 11 — ABNORMAL LOW (ref 12–28)
BUN: 8 mg/dL (ref 8–27)
CO2: 27 mmol/L (ref 20–29)
Calcium: 9.5 mg/dL (ref 8.7–10.3)
Chloride: 93 mmol/L — ABNORMAL LOW (ref 96–106)
Creatinine, Ser: 0.73 mg/dL (ref 0.57–1.00)
Glucose: 104 mg/dL — ABNORMAL HIGH (ref 70–99)
Potassium: 4.4 mmol/L (ref 3.5–5.2)
Sodium: 135 mmol/L (ref 134–144)
eGFR: 84 mL/min/{1.73_m2} (ref >59)

## 2021-07-15 ENCOUNTER — Ambulatory Visit: Payer: PPO | Admitting: Cardiology

## 2021-07-15 ENCOUNTER — Encounter: Payer: Self-pay | Admitting: Cardiology

## 2021-07-15 VITALS — BP 133/71 | HR 60 | Ht 64.0 in | Wt 203.4 lb

## 2021-07-15 DIAGNOSIS — I2510 Atherosclerotic heart disease of native coronary artery without angina pectoris: Secondary | ICD-10-CM

## 2021-07-15 DIAGNOSIS — E669 Obesity, unspecified: Secondary | ICD-10-CM | POA: Diagnosis not present

## 2021-07-15 DIAGNOSIS — G4733 Obstructive sleep apnea (adult) (pediatric): Secondary | ICD-10-CM | POA: Diagnosis not present

## 2021-07-15 DIAGNOSIS — I10 Essential (primary) hypertension: Secondary | ICD-10-CM | POA: Diagnosis not present

## 2021-07-15 MED ORDER — FUROSEMIDE 20 MG PO TABS: 20.0000 mg | ORAL_TABLET | ORAL | 3 refills | Status: DC

## 2021-07-15 NOTE — Patient Instructions (Signed)
Medication Instructions:  ?Your physician has recommended you make the following change in your medication: LASIX '20MG'$  ONCE WEEKLY  ?*If you need a refill on your cardiac medications before your next appointment, please call your pharmacy* ? ? ?Lab Work: ?NONE ?If you have labs (blood work) drawn today and your tests are completely normal, you will receive your results only by: ?MyChart Message (if you have MyChart) OR ?A paper copy in the mail ?If you have any lab test that is abnormal or we need to change your treatment, we will call you to review the results. ? ? ?Testing/Procedures: ?NONE ? ? ?Follow-Up: ?At [facility], you and your health needs are our priority.  As part of our continuing mission to provide you with exceptional heart care, we have created designated Provider Care Teams.  These Care Teams include your primary Cardiologist (physician) and Advanced Practice Providers (APPs -  Physician Assistants and Nurse Practitioners) who all work together to provide you with the care you need, when you need it. ? ?We recommend signing up for the patient portal called "MyChart".  Sign up information is provided on this After Visit Summary.  MyChart is used to connect with patients for Virtual Visits (Telemedicine).  Patients are able to view lab/test results, encounter notes, upcoming appointments, etc.  Non-urgent messages can be sent to your provider as well.   ?To learn more about what you can do with MyChart, go to [email or web address].   ? ?Your next appointment:   ?6 month(s) ? ?The format for your next appointment:   ?In Person ? ?Provider:   ?Berniece Salines, DO   ? ?Important Information About Sugar ? ? ? ? ?  ?

## 2021-07-16 NOTE — Progress Notes (Signed)
?Cardiology Office Note:   ? ?Date:  07/16/2021  ? ?ID:  Karen Castillo, DOB 11-03-1940, MRN [id number] ? ?PCP:  Nicoletta Dress, MD  ?Cardiologist:  Berniece Salines, DO  ?Electrophysiologist:  None  ? ?Referring MD: Nicoletta Dress, MD  ? ?" I am doing well" ? ?History of Present Illness:   ? ?Karen Castillo is a 81 y.o. female with a hx of   coronary artery disease on CTA, hypertension, hyperlipidemia, sleep apnea is here today for follow-up visit.  ? ?Since I saw the patient in June 2022 she has been seen by Coletta Memos, NP for shortness of breath.  At that time she was placed on Lasix and echocardiogram was repeated. ? ?Past Medical History:  ?Diagnosis Date  ? Abdominal pain   ? Acute post-operative pain 07/13/2017  ? Allergic rhinitis   ? Anemia   ? Arthritis   ? Bipolar disorder ([place])   ? patient denies  ? Cancer [facility])   ? SKIN    ,    LEFT BREAST   ? Chronic bilateral low back pain 09/16/2016  ? Chronic constipation   ? Chronic pain syndrome   ? Constipation   ? Coronary artery disease involving native coronary artery of native heart without angina pectoris 02/13/2020  ? 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 Dry eyes   ? Dysuria   ? Elevated cholesterol   ? 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 Gallstones   ? GERD (gastroesophageal reflux disease)   ? Glaucoma   ? both eyes  ? Gout   ? Hypertension   ? IBS (irritable bowel syndrome)   ? Idiopathic peripheral neuropathy 06/11/2015  ? 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 Ingrown nail of great toe of right foot 05/04/2017  ? Insomnia   ? Intractable low back pain 07/13/2017  ? Low back pain 08/27/2017  ? 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 Nausea without vomiting   ? Obesity   ? Obesity (BMI 30.0-34.9) 02/13/2020  ? Pain syndrome, chronic 11/29/2015  ? Paronychia of great toe, right 05/04/2017  ? 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/26/2010  ? 3.6 cm  ? Postoperative anemia due to acute blood loss 08/29/2017  ?  Psychophysiological insomnia 04/03/2015  ? Radiculopathy 05/04/2014  ? Radiculopathy of lumbar region 11/29/2015  ? Risk for falls 06/11/2015  ? Schizophrenia ([place])   ? Sleep apnea   ? USES CPAP   ? Sleep apnea, obstructive 04/03/2015  ? Urticaria   ? Vitamin deficiency   ? ? ?Past Surgical History:  ?Procedure Laterality Date  ? ABDOMINAL EXPOSURE N/A 05/04/2014  ? Procedure: ABDOMINAL EXPOSURE;  Surgeon: Serafina Mitchell, MD;  Location: [facility];  Service: Vascular;  Laterality: N/A;  ? ANTERIOR LUMBAR FUSION N/A 05/04/2014  ? Procedure: ANTERIOR LUMBAR FUSION 1 LEVEL;  Surgeon: Sinclair Ship, MD;  Location: [facility];  Service: Orthopedics;  Laterality: N/A;  Lumbar 5-sacrum 1 anterior lumbar interbody fusion with instrumentation and allograft  ? ANTERIOR LUMBAR FUSION  07/2017  ? ANTERIOR LUMBAR FUSION  08/2017  ? BACK SURGERY    ? 2010 ,2015LUMB FUSION, 06/2013 [facility] FUSION  ? BACK SURGERY  07/2017  ? Lumbar  ? BREAST BIOPSY  2013  ? pt said it was positive   ? BREAST SURGERY    ? LEFT BRREAST BX  ? CARPAL TUNNEL RELEASE    ? 1994 RT  ? CARPAL TUNNEL RELEASE Bilateral   ?  CATARACT EXTRACTION, BILATERAL  2017  ? CHOLECYSTECTOMY    ? 2004  ? COLONOSCOPY W/ BIOPSIES    ? Colonoscopy 2004,2007, 2009, 2011  ? ERCP    ? 2009  ? ESOPHAGOGASTRODUODENOSCOPY  08/09/2017  ? Small hiatal hernia. Mild gastritis. Incidenta; duodenal diverticulum. Incidental gastric polyp.   ? LEEP    ? 1992 and 1994  ? SKIN CANCER EXCISION    ? X 4   ? thorocolumbar fusion  06/2013  ? TUBAL LIGATION    ? ? ?Current Medications: ?Current Meds  ?Medication Sig  ? alendronate (FOSAMAX) 70 MG tablet Take 70 mg by mouth every Thursday. Take with a full glass of water on an empty stomach.  ? allopurinol (ZYLOPRIM) 100 MG tablet Take 100 mg by mouth daily.  ? amLODipine (NORVASC) 10 MG tablet Take 10 mg by mouth daily.  ? aspirin EC 81 MG tablet Take 81 mg by mouth daily.  ? Biotin 10 MG TABS Take 10 mg by mouth daily.  ? calcium carbonate (OSCAL) 1500  (600 Ca) MG TABS tablet Take 600 mg of elemental calcium by mouth 3 (three) times daily with meals.  ? Cholecalciferol (VITAMIN D3) 2000 units TABS Take 1 tablet by mouth daily.  ? cloNIDine (CATAPRES) 0.1 MG tablet Take 0.1 mg by mouth at bedtime.  ? D-Mannose 500 MG CAPS Take 500 mg by mouth daily.  ? HYDROmorphone (DILAUDID) 4 MG tablet Take 1 tablet (4 mg total) by mouth every 6 (six) hours as needed for severe pain or moderate pain. Four times a day per patient report  ? lansoprazole (PREVACID) 30 MG capsule Take 1 capsule (30 mg total) by mouth daily before breakfast. 30 minutes before breakfast  ? latanoprost (XALATAN) 0.005 % ophthalmic solution Place 1 drop into both eyes at bedtime.  ? levofloxacin (LEVAQUIN) 250 MG tablet Take 250 mg by mouth daily.  ? linaclotide (LINZESS) 290 MCG CAPS capsule Take 1 capsule (290 mcg total) by mouth daily before breakfast.  ? Magnesium 250 MG TABS Take 250 mg by mouth daily.  ? methocarbamol (ROBAXIN) 750 MG tablet Take 1 tablet (750 mg total) by mouth 3 (three) times daily.  ? nadolol (CORGARD) 40 MG tablet Take 60 mg by mouth daily.  ? NARCAN 4 MG/0.1ML LIQD nasal spray kit Place 4 mg into the nose. I spray in both nares PRN  ? nitroGLYCERIN (NITROSTAT) 0.4 MG SL tablet Place 1 tablet (0.4 mg total) under the tongue every 5 (five) minutes as needed for chest pain.  ? Omega-3 Fatty Acids (FISH OIL) 600 MG CAPS Take 600 mg by mouth 2 (two) times daily.  ? oxybutynin (DITROPAN) 5 MG tablet Take 5 mg by mouth 2 (two) times daily.  ? polyethylene glycol (MIRALAX / GLYCOLAX) 17 g packet Take 17 g by mouth daily as needed for mild constipation. (Patient taking differently: Take 17 g by mouth every other day.)  ? ranolazine (RANEXA) 500 MG 12 hr tablet TAKE 1 TABLET BY MOUTH TWICE A DAY (Patient taking differently: Take 500 mg by mouth 2 (two) times daily.)  ? rosuvastatin (CRESTOR) 20 MG tablet Take 20 mg by mouth at bedtime.  ? spironolactone (ALDACTONE) 25 MG tablet Take 25  mg by mouth daily.  ? spironolactone-hydrochlorothiazide (ALDACTAZIDE) 25-25 MG tablet Take 1 tablet by mouth daily.  ? timolol (BETIMOL) 0.5 % ophthalmic solution Place 1 drop into both eyes daily. In the morning  ? trazodone (DESYREL) 300 MG tablet Take 300 mg by   mouth at bedtime.  ? valsartan (DIOVAN) 80 MG tablet Take 80 mg by mouth daily.  ? Wheat Dextrin (BENEFIBER) POWD Take 1 packet by mouth daily. Clear and Sugar FREE. 1 tablespoon daily  ? [DISCONTINUED] furosemide (LASIX) 20 MG tablet Take 1 tablet (20 mg total) by mouth daily.  ?  ? ?Allergies:   Ciprofloxacin, Cyclobenzaprine, Pregabalin, Fluvastatin, Meloxicam, Metaxalone, Pramipexole, Pravastatin, Sertraline, Macrobid [nitrofurantoin], Amitriptyline, Arthrotec [diclofenac-misoprostol], Augmentin [amoxicillin-pot clavulanate], Bromfenac, Cephalexin, Conj estrog-medroxyprogest ace, Elemental sulfur, Erythromycin, Esomeprazole magnesium, Levofloxacin, Methadone, Metoclopramide, Other, Oxycodone, Pravachol [pravastatin sodium], Rofecoxib, Tramadol, and Zoloft [sertraline hcl]  ? ?Social History  ? ?Socioeconomic History  ? Marital status: Widowed  ?  Spouse name: Not on file  ? Number of children: 2  ? Years of education: Not on file  ? Highest education level: Not on file  ?Occupational History  ? Occupation: Retired  ?Tobacco Use  ? Smoking status: Never  ? Smokeless tobacco: Never  ?Vaping Use  ? Vaping Use: Never used  ?Substance and Sexual Activity  ? Alcohol use: No  ? Drug use: No  ? Sexual activity: Not on file  ?Other Topics Concern  ? Not on file  ?Social History Narrative  ? Not on file  ? ?Social Determinants of Health  ? ?Financial Resource Strain: Not on file  ?Food Insecurity: Not on file  ?Transportation Needs: Not on file  ?Physical Activity: Not on file  ?Stress: Not on file  ?Social Connections: Not on file  ?  ? ?Family History: ?The patient's family history includes Diabetes in her mother; Prostate cancer in her father; Stroke in her  sister. There is no history of Colon cancer, Esophageal cancer, Stomach cancer, or Rectal cancer. ? ?ROS:   ?Review of Systems  ?Constitution: Negative for decreased appetite, fever and weight gain.  ?HENT: [place]

## 2021-07-26 ENCOUNTER — Telehealth: Payer: Self-pay

## 2021-07-26 NOTE — Telephone Encounter (Signed)
Patient was rescheduled for 6/16 at 1:50pm

## 2021-07-26 NOTE — Telephone Encounter (Signed)
LVM on home 2 times   Spoke to daughter [name] and she took the number to call for her mom to reschedule her appt since she isnt up yet. She said she will have her call this afternoon

## 2021-07-26 NOTE — Telephone Encounter (Signed)
Noted! Thank you

## 2021-07-30 ENCOUNTER — Ambulatory Visit: Payer: PPO | Admitting: Gastroenterology

## 2021-08-19 ENCOUNTER — Other Ambulatory Visit: Payer: Self-pay | Admitting: Cardiology

## 2021-08-19 NOTE — Telephone Encounter (Signed)
Rx refill sent to pharmacy. 

## 2021-08-23 ENCOUNTER — Other Ambulatory Visit (INDEPENDENT_AMBULATORY_CARE_PROVIDER_SITE_OTHER): Payer: PPO

## 2021-08-23 ENCOUNTER — Ambulatory Visit (INDEPENDENT_AMBULATORY_CARE_PROVIDER_SITE_OTHER): Payer: PPO | Admitting: Gastroenterology

## 2021-08-23 ENCOUNTER — Encounter: Payer: Self-pay | Admitting: Gastroenterology

## 2021-08-23 VITALS — BP 130/60 | HR 70 | Ht 64.0 in | Wt 205.0 lb

## 2021-08-23 DIAGNOSIS — K219 Gastro-esophageal reflux disease without esophagitis: Secondary | ICD-10-CM

## 2021-08-23 DIAGNOSIS — K449 Diaphragmatic hernia without obstruction or gangrene: Secondary | ICD-10-CM

## 2021-08-23 DIAGNOSIS — R7989 Other specified abnormal findings of blood chemistry: Secondary | ICD-10-CM

## 2021-08-23 DIAGNOSIS — G8929 Other chronic pain: Secondary | ICD-10-CM

## 2021-08-23 DIAGNOSIS — R109 Unspecified abdominal pain: Secondary | ICD-10-CM

## 2021-08-23 DIAGNOSIS — R935 Abnormal findings on diagnostic imaging of other abdominal regions, including retroperitoneum: Secondary | ICD-10-CM | POA: Diagnosis not present

## 2021-08-23 DIAGNOSIS — K581 Irritable bowel syndrome with constipation: Secondary | ICD-10-CM

## 2021-08-23 LAB — COMPREHENSIVE METABOLIC PANEL
ALT: 11 U/L (ref 0–35)
AST: 25 U/L (ref 0–37)
Albumin: 4 g/dL (ref 3.5–5.2)
Alkaline Phosphatase: 60 U/L (ref 39–117)
BUN: 12 mg/dL (ref 6–23)
CO2: 30 mEq/L (ref 19–32)
Calcium: 9.9 mg/dL (ref 8.4–10.5)
Chloride: 98 mEq/L (ref 96–112)
Creatinine, Ser: 0.84 mg/dL (ref 0.40–1.20)
GFR: 66.06 mL/min (ref >60.00)
Glucose, Bld: 84 mg/dL (ref 70–99)
Potassium: 4.3 mEq/L (ref 3.5–5.1)
Sodium: 136 mEq/L (ref 135–145)
Total Bilirubin: 0.3 mg/dL (ref 0.2–1.2)
Total Protein: 7.7 g/dL (ref 6.0–8.3)

## 2021-08-23 LAB — CBC WITH DIFFERENTIAL/PLATELET
Basophils Absolute: 0 10*3/uL (ref 0.0–0.1)
Basophils Relative: 0.1 % (ref 0.0–3.0)
Eosinophils Absolute: 0 10*3/uL (ref 0.0–0.7)
Eosinophils Relative: 0.1 % (ref 0.0–5.0)
HCT: 40.6 % (ref 36.0–46.0)
Hemoglobin: 13.3 g/dL (ref 12.0–15.0)
Lymphocytes Relative: 11.1 % — ABNORMAL LOW (ref 12.0–46.0)
Lymphs Abs: 1.3 10*3/uL (ref 0.7–4.0)
MCHC: 32.8 g/dL (ref 30.0–36.0)
MCV: 91.8 fl (ref 78.0–100.0)
Monocytes Absolute: 0.9 10*3/uL (ref 0.1–1.0)
Monocytes Relative: 7.7 % (ref 3.0–12.0)
Neutro Abs: 9.8 10*3/uL — ABNORMAL HIGH (ref 1.4–7.7)
Neutrophils Relative %: 81 % — ABNORMAL HIGH (ref 43.0–77.0)
Platelets: 269 10*3/uL (ref 150.0–400.0)
RBC: 4.42 Mil/uL (ref 3.87–5.11)
RDW: 13.9 % (ref 11.5–15.5)
WBC: 12.1 10*3/uL — ABNORMAL HIGH (ref 4.0–10.5)

## 2021-08-23 LAB — LIPASE: Lipase: 19 U/L (ref 11.0–59.0)

## 2021-08-23 NOTE — Progress Notes (Signed)
Chief Complaint: FU  Referring Provider:  Nicoletta Dress, MD      ASSESSMENT AND PLAN;   #1. Abn LFTs with US showing increasing biliary dil as below. H/O prev ERCP with ES d/t SOD 2009. Has H/O fatty liver as well.  #2. GERD with small HH with Atypical chest pains. Neg cardiac eval. Well controlled on Prevacid.  #3.  Chronic abdo pain, neg CTA 01/12/2019, neg CT scan 07/2019, 08/2017, 01/2019 except for constipation and rectal impaction s/p manual disimpaction.  #4. IBS-C with OIC (opioid induced constipation). Neg CTA 01/2019.  Plan: -CBC, CMP, lipase -MRCP -If +, ERCP. Otherwise, just observe and trend LFTs -D/W daughter.   HPI:    Karen Castillo is a 81 y.o. female   For FU New Abn LFTs as below. No ETOH or any new meds. Initially had some epi pain which has resolved. No jaundice dark urine or pale stools. Underwent US as below showing increasing biliary dilatation.  Radiology recommends MRCP.  No significant heartburn on Prevacid.  No dysphagia after dil 08/2019.  Constipation is better with Linzess/magnesium/Benefiber and MiraLAX.  Note that she is on Dilaudid for chronic pain.  Currently no abdominal pain.  No nausea/vomiting.  No fever chills or night sweats.  She has good appetite.   From previous notes: Seen [facility] ED at [facility] with atypical chest pains. Neg CT AP 07/2019.  [facility] Nov 2020 with atypical chest pains.  Neg cardiac evaluation including cardiac CT, 2D echo. Also neg CTA chest Abdo/pelvis 01/12/2019.  Adamantly wants to hold off on colonoscopy since she had problems drinking preparation during the last colonoscopy.  She does understand that there is a small but definite chance of missing colorectal neoplasms.   Past GI procedures:  [place] 06/17/2021 at [facility] 1. The patient is status post cholecystectomy. 2. The common bile duct measures 12.1 mm today versus 8.7 mm on May 24, 2016. The common bile duct prominence/dilatation could simply be due to  previous cholecystectomy. Recommend correlation with labs. If there is concern for biliary obstruction, recommend an MRCP or ERCP. 3. Probable hepatic steatosis   EGD with dil 09/07/2019 - Benign-appearing esophageal stenosis. Dilated to 76 Fr - Small transient hiatal hernia. - Gastritis. Biopsied. - A few incidental gastric polyps. - Non-bleeding duodenal diverticulum.  -EGD 2004, 2007, 2009, 2011 (Dr Evette Cristal), 2016 (Dr Lyndel Safe), 2018 (Dr Melina Copa): small HH, mild gastritis, duodenal diverticulum, small gastric polyps (hyperplastic/fundic gland) -Colonoscopy 2004, 2007, 2009, 2011(Dr Dubinski at [place], [place] 68yr) [facility] Past Medical History:  Diagnosis Date   Abdominal pain    Acute post-operative pain 07/13/2017   Allergic rhinitis    Anemia    Arthritis    B[place] disorder ([place]    [place]   [place]    SKIN    ,    LEFT BREAST    Chronic bilateral low back pain 09/16/2016   Chronic constipation    Chronic pain syndrome    Constipation    Coronary artery disease involving native coronary artery of native heart without angina pectoris 02/13/2020   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   Dry eyes    Dysuria    Elevated cholesterol    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   Gallstones    GERD (gastroesophageal reflux disease)    Glaucoma    both eyes   Gout    Hypertension    IBS (irritable bowel syndrome)  Idiopathic peripheral neuropathy 06/11/2015   Infection of lumbar spine (HCC)    Ingrown nail of great toe of right foot 05/04/2017   Insomnia    Intractable low back pain 07/13/2017   Low back pain 08/27/2017   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   Nausea without vomiting    Obesity    Obesity (BMI 30.0-34.9) 02/13/2020   Pain syndrome, chronic 11/29/2015   Paronychia of great toe, right 05/04/2017   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/26/2010   3.6 cm   Postoperative anemia due to acute  blood loss 08/29/2017   Psychophysiological insomnia 04/03/2015   Radiculopathy 05/04/2014   Radiculopathy of lumbar region 11/29/2015   Risk for falls 06/11/2015   Schizophrenia ([place])    Sleep apnea    USES CPAP    Sleep apnea, obstructive 04/03/2015   Urticaria    Vitamin deficiency     Past Surgical History:  Procedure Laterality Date   ABDOMINAL EXPOSURE N/A 05/04/2014   Procedure: ABDOMINAL EXPOSURE;  Surgeon: Serafina Mitchell, MD;  Location: [place];  Service: Vascular;  Laterality: N/A;   ANTERIOR LUMBAR FUSION N/A 05/04/2014   Procedure: ANTERIOR LUMBAR FUSION 1 LEVEL;  Surgeon: Sinclair Ship, MD;  Location: [place];  Service: Orthopedics;  Laterality: N/A;  Lumbar 5-sacrum 1 anterior lumbar interbody fusion with instrumentation and allograft   ANTERIOR LUMBAR FUSION  07/2017   ANTERIOR LUMBAR FUSION  08/2017   BACK SURGERY     2010 ,2015LUMB FUSION, 06/2013 [facility] FUSION   BACK SURGERY  07/2017   Lumbar   BREAST BIOPSY  2013   pt said it was positive    BREAST SURGERY     LEFT BRREAST BX   CARPAL TUNNEL RELEASE     1994 RT   CARPAL TUNNEL RELEASE Bilateral    CATARACT EXTRACTION, BILATERAL  2017   CHOLECYSTECTOMY     2004   COLONOSCOPY W/ BIOPSIES     Colonoscopy 2992,4268, 2009, 2011   ERCP     2009   ESOPHAGOGASTRODUODENOSCOPY  08/09/2017   Small hiatal hernia. Mild gastritis. Incidenta; duodenal diverticulum. Incidental gastric polyp.    LEEP     1992 and 1994   SKIN CANCER EXCISION     X 4    thorocolumbar fusion  06/2013   TUBAL LIGATION      Family History  Problem Relation Age of Onset   Diabetes Mother        had a history but not anymore   Prostate cancer Father    Stroke Sister        has a brain tumor   Colon cancer Neg Hx    Esophageal cancer Neg Hx    Stomach cancer Neg Hx    Rectal cancer Neg Hx     Social History   Tobacco Use   Smoking status: Never   Smokeless tobacco: Never  Vaping Use   Vaping Use: Never used  Substance Use  Topics   Alcohol use: No   Drug use: No    Current Outpatient Medications  Medication Sig Dispense Refill   alendronate (FOSAMAX) 70 MG tablet Take 70 mg by mouth every Thursday. Take with a full glass of water on an empty stomach.     allopurinol (ZYLOPRIM) 100 MG tablet Take 100 mg by mouth daily.     amLODipine (NORVASC) 10 MG tablet Take 10 mg by mouth daily.     aspirin EC 81 MG tablet Take  81 mg by mouth daily.     Biotin 10 MG TABS Take 10 mg by mouth daily.     calcium carbonate (OSCAL) 1500 (600 Ca) MG TABS tablet Take 600 mg of elemental calcium by mouth 3 (three) times daily with meals.     Cholecalciferol (VITAMIN D3) 2000 units TABS Take 1 tablet by mouth daily.     cloNIDine (CATAPRES) 0.1 MG tablet Take 0.1 mg by mouth at bedtime.     D-Mannose 500 MG CAPS Take 500 mg by mouth daily.     furosemide (LASIX) 20 MG tablet Take 1 tablet (20 mg total) by mouth once a week. 13 tablet 3   HYDROmorphone (DILAUDID) 4 MG tablet Take 1 tablet (4 mg total) by mouth every 6 (six) hours as needed for severe pain or moderate pain. Four times a day per patient report 12 tablet 0   lansoprazole (PREVACID) 30 MG capsule Take 1 capsule (30 mg total) by mouth daily before breakfast. 30 minutes before breakfast 30 capsule 0   latanoprost (XALATAN) 0.005 % ophthalmic solution Place 1 drop into both eyes at bedtime.     linaclotide (LINZESS) 290 MCG CAPS capsule Take 1 capsule (290 mcg total) by mouth daily before breakfast. 30 capsule 11   Magnesium 250 MG TABS Take 250 mg by mouth daily.     methocarbamol (ROBAXIN) 750 MG tablet Take 1 tablet (750 mg total) by mouth 3 (three) times daily. 21 tablet 0   nadolol (CORGARD) 40 MG tablet Take 60 mg by mouth daily.     NARCAN 4 MG/0.1ML LIQD nasal spray kit Place 4 mg into the nose. I spray in both nares PRN     nitroGLYCERIN (NITROSTAT) 0.4 MG SL tablet Place 1 tablet (0.4 mg total) under the tongue every 5 (five) minutes as needed for chest pain. 30  tablet 0   Omega-3 Fatty Acids (FISH OIL) 600 MG CAPS Take 600 mg by mouth 2 (two) times daily.     oxybutynin (DITROPAN) 5 MG tablet Take 5 mg by mouth 2 (two) times daily.     polyethylene glycol (MIRALAX / GLYCOLAX) 17 g packet Take 17 g by mouth daily as needed for mild constipation. (Patient taking differently: Take 17 g by mouth every other day.)     ranolazine (RANEXA) 500 MG 12 hr tablet Take 1 tablet (500 mg total) by mouth 2 (two) times daily. 180 tablet 3   rosuvastatin (CRESTOR) 20 MG tablet Take 20 mg by mouth at bedtime.     spironolactone-hydrochlorothiazide (ALDACTAZIDE) 25-25 MG tablet Take 1 tablet by mouth daily.     timolol (BETIMOL) 0.5 % ophthalmic solution Place 1 drop into both eyes daily. In the morning     trazodone (DESYREL) 300 MG tablet Take 300 mg by mouth at bedtime.     valsartan (DIOVAN) 80 MG tablet Take 80 mg by mouth daily.     Wheat Dextrin (BENEFIBER) POWD Take 1 packet by mouth daily. Clear and Sugar FREE. 1 tablespoon daily     No current facility-administered medications for this visit.    Allergies  Allergen Reactions   Ciprofloxacin Swelling    Tongue swells   Cyclobenzaprine Swelling    Feet swell    Pregabalin Anaphylaxis, Shortness Of Breath, Swelling and Other (See Comments)    Dizziness also   Fluvastatin Other (See Comments)    Leg pain    Meloxicam Swelling    Feet became swollen   Metaxalone Rash and  Other (See Comments)    Flushing of the skin and sore throat, too   Pramipexole Nausea And Vomiting   Pravastatin Other (See Comments)    Leg pain   Sertraline Other (See Comments)    Sensitive teeth   Macrobid [Nitrofurantoin] Diarrhea and Nausea Only   Amitriptyline Other (See Comments)    Weird feeling all over   Arthrotec [Diclofenac-Misoprostol] Diarrhea   Augmentin [Amoxicillin-Pot Clavulanate] Diarrhea    Has patient had a PCN reaction causing immediate rash, facial/tongue/throat swelling, SOB or lightheadedness with  hypotension: No Has patient had a PCN reaction causing severe rash involving mucus membranes or skin necrosis: No Has patient had a PCN reaction that required hospitalization: No Has patient had a PCN reaction occurring within the last 10 years: No If all of the above answers are "NO", then may proceed with Cephalosporin use.    Bromfenac Diarrhea and Nausea Only   Cephalexin Diarrhea and Nausea Only   Conj Estrog-Medroxyprogest Ace Other (See Comments)    Unsure of reaction type Couldn't tolerate   Elemental Sulfur Diarrhea and Nausea Only   Erythromycin Diarrhea and Nausea Only   Esomeprazole Magnesium Nausea Only   Levofloxacin Other (See Comments) and Nausea Only    Stomach upset   Methadone Diarrhea   Metoclopramide Diarrhea and Nausea Only   Other Other (See Comments)    Darvocet   Oxycodone Hives, Swelling and Rash   Pravachol [Pravastatin Sodium] Other (See Comments)    Upper leg pain   Rofecoxib Diarrhea and Nausea Only   Tramadol Diarrhea   Zoloft [Sertraline Hcl] Other (See Comments)    Sensitivity with teeth    Review of Systems:  neg     Physical Exam:    BP 130/60   Pulse 70   Ht _0  (1.626 m)   Wt 205 lb (93 kg)   SpO2 98%   BMI 35.19 kg/m  Filed Weights   08/23/21 1354  Weight: 205 lb (93 kg)   Gen: awake, alert, NAD HEENT: anicteric, no pallor CV: RRR, no mrg Pulm: CTA b/l Abd: soft, NT/ND, +BS throughout Ext: no c/c/e Neuro: nonfocal  Data Reviewed: I have personally reviewed following labs and imaging studies  CBC:    Latest Ref Rng & Units 07/04/2021    1:30 AM 06/12/2021   11:20 AM 03/15/2020   10:34 AM  CBC  WBC 4.0 - 10.5 K/uL 8.9  7.2  8.6   Hemoglobin 12.0 - 15.0 g/dL 11.9  12.7  12.6   Hematocrit 36.0 - 46.0 % 34.9  38.5  37.9   Platelets 150 - 400 K/uL 231  309  266     CMP:    Latest Ref Rng & Units 07/11/2021   12:57 PM 07/04/2021    1:30 AM 06/18/2021    3:51 PM  CMP  Glucose 70 - 99 mg/dL 104  128  104   BUN 8 - 27  mg/dL _1 Creatinine 0.57 - 1.00 mg/dL 0.73  0.68  0.82   Sodium 134 - 144 mmol/L 135  131  136   Potassium 3.5 - 5.2 mmol/L 4.4  3.4  4.8   Chloride 96 - 106 mmol/L 93  99  96   CO2 20 - 29 mmol/L _2 Calcium 8.7 - 10.3 mg/dL 9.5  9.0  9.8   Total Protein 6.5 - 8.1 g/dL  6.7    Total Bilirubin 0.3 -  1.2 mg/dL  0.9    Alkaline Phos 38 - 126 U/L  118    AST 15 - 41 U/L  243    ALT 0 - 44 U/L  94          Carmell Austria, MD 08/23/2021, 1:57 PM  Cc: Nicoletta Dress, MD

## 2021-08-23 NOTE — Patient Instructions (Addendum)
If you are age 80 or older, your body mass index should be between 23-30. Your Body mass index is 35.19 kg/m. If this is out of the aforementioned range listed, please consider follow up with your Primary Care Provider.  If you are age 27 or younger, your body mass index should be between 19-25. Your Body mass index is 35.19 kg/m. If this is out of the aformentioned range listed, please consider follow up with your Primary Care Provider.   ________________________________________________________  The [place] [facility] providers would like to encourage you to use [facility] to communicate with providers for non-urgent requests or questions.  Due to long hold times on the telephone, sending your provider a message by [facility] may be a faster and more efficient way to get a response.  Please allow 48 business hours for a response.  Please remember that this is for non-urgent requests.  _______________________________________________________  Your provider has requested that you go to the basement level for lab work before leaving today. Press "B" on the elevator. The lab is located at the first door on the left as you exit the elevator.  Please call our office in 1 week if you haven't heard anything from [facility] regarding your MRCP.  Thank you,  Dr. Jackquline Denmark

## 2021-08-26 ENCOUNTER — Telehealth: Payer: Self-pay | Admitting: Gastroenterology

## 2021-08-27 NOTE — Telephone Encounter (Signed)
See result note.  

## 2021-08-29 ENCOUNTER — Telehealth: Payer: Self-pay

## 2021-08-29 NOTE — Telephone Encounter (Signed)
Called pt regarding if she has been able to schedule her MRI at [facility] and scheduler stated they are backed up due to being short staffed but Daleen Snook said they have it and they will reach out to pt regarding this

## 2022-03-20 NOTE — Progress Notes (Signed)
Cardiology Office Note:    Date:  03/21/2022   ID:  Karen Castillo Apr 19, 1940, MRN [id number]  PCP:  Nicoletta Dress, MD  Cardiologist:  Shirlee More, MD    Referring MD: Nicoletta Dress, MD    ASSESSMENT:    1. Hypertensive heart disease with chronic diastolic congestive heart failure ([place])   2. Coronary artery disease involving native coronary artery of native heart without angina pectoris   3. Mixed hyperlipidemia    PLAN:    In order of problems listed above:  Overall is doing well but over the Christmas holidays dietary indiscretion and weight went up and we decided new strategy for diuretic she will plan to take it every day if her weight is 213 or greater.  Prescription be written for daily Difficult to judge blood pressure control she is not recording and trending home blood pressure as opposed to changing her medications lasted for 2 weeks with good technique validated device to check her blood pressure at home and leave a list for me.in the interim she will continue her current regimen which includes her loop diuretic beta-blocker ARB.   She is doing well with CAD having no angina we will continue her treatment including aspirin high intensity statin lipids are at target and beta-blocker. Lipids at target continue her high intensity statin  Next appointment: 1 year   Medication Adjustments/Labs and Tests Ordered: Current medicines are reviewed at length with the patient today.  Concerns regarding medicines are outlined above.  No orders of the defined types were placed in this encounter.  No orders of the defined types were placed in this encounter.   Chief complaint follow-up CAD, my weight went up from having to take my diuretic more commonly  History of Present Illness:    Karen Castillo is a 82 y.o. female with a hx of CAD hypertension hyperlipidemia and sleep apnea last seen 07/15/2021 for shortness of breath.  She had echocardiogram  performed 06/18/2021 showing normal left ventricular ejection fraction 60 to 65% mild concentric LVH grade 1 diastolic dysfunction.  Right ventricle normal size and function with mildly elevated pulmonary artery systolic pressure.  She had been initiated on furosemide and was continued with this.  Chest x-ray performed 07/04/2021 was normal without lung disease or findings of heart failure.  Chart review shows that her cardiac CTA performed 01/16/2019 showed a coronary calcium score extremely elevated 1213 95th percentile and CAD was present with 50 to 69% calcified proximal LAD stenosis.  Compliance with diet, lifestyle and medications: Yes  Overall doing well she is taking furosemide perhaps 1 time a week but over Christmas her weight went up to 15 pounds. Also unfortunately has had an enteritis related to restaurant food likely coleslaw 24 hours of nausea and vomiting From cardiology perspective is done well she is not having edema shortness of breath chest pain palpitation or syncope she tolerates her statin without muscle pain or weakness Recent labs 12/16/2021 cholesterol 128 LDL 57 A1c 6.0 hemoglobin 13.3 creatinine 0.82 potassium 5.3.  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   Gallstones    GERD (gastroesophageal reflux disease)    Glaucoma    both eyes   Gout    Hypertension    IBS (irritable bowel syndrome)    Idiopathic peripheral neuropathy  06/11/2015   Infection of lumbar spine (HCC)    Ingrown nail of great toe of right foot 05/04/2017   Insomnia    Intractable low back pain 07/13/2017   Low back pain 08/27/2017   Malignant neoplasm of left breast ([place]) 04/24/2011   Nausea without vomiting    Obesity    Obesity (BMI 30.0-34.9) 02/13/2020   Pain syndrome, chronic 11/29/2015   Paronychia of great toe, right 05/04/2017   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/26/2010   3.6 cm   Postoperative anemia due to acute blood loss 08/29/2017   Psychophysiological insomnia 04/03/2015   Radiculopathy 05/04/2014   Radiculopathy of lumbar region 11/29/2015   Risk for falls 06/11/2015   Schizophrenia ([place])    Sleep apnea    USES CPAP    Sleep apnea, obstructive 04/03/2015   Urticaria    Vitamin deficiency     Past Surgical History:  Procedure Laterality Date   ABDOMINAL EXPOSURE N/A 05/04/2014   Procedure: ABDOMINAL EXPOSURE;  Surgeon: Serafina Mitchell, MD;  Location: [place];  Service: Vascular;  Laterality: N/A;   ANTERIOR LUMBAR FUSION N/A 05/04/2014   Procedure: ANTERIOR LUMBAR FUSION 1 LEVEL;  Surgeon: Sinclair Ship, MD;  Location: [place];  Service: Orthopedics;  Laterality: N/A;  Lumbar 5-sacrum 1 anterior lumbar interbody fusion with instrumentation and allograft   ANTERIOR LUMBAR FUSION  07/2017   ANTERIOR LUMBAR FUSION  08/2017   BACK SURGERY     2010 ,2015LUMB FUSION, 06/2013 [facility] FUSION   BACK SURGERY  07/2017   Lumbar   BREAST BIOPSY  2013   pt said it was positive    BREAST SURGERY     LEFT BRREAST BX   CARPAL TUNNEL RELEASE     1994 RT   CARPAL TUNNEL RELEASE Bilateral    CATARACT EXTRACTION, BILATERAL  2017   CHOLECYSTECTOMY     2004   COLONOSCOPY W/ BIOPSIES     Colonoscopy 0175,1025, 2009, 2011   ERCP     2009   ESOPHAGOGASTRODUODENOSCOPY  08/09/2017   Small hiatal hernia. Mild gastritis. Incidenta; duodenal diverticulum. Incidental gastric polyp.    LEEP     1992 and 1994   SKIN CANCER EXCISION      X 4    thorocolumbar fusion  06/2013   TUBAL LIGATION      Current Medications: Current Meds  Medication Sig   alendronate (FOSAMAX) 70 MG tablet Take 70 mg by mouth every Thursday. Take with a full glass of water on an empty stomach.   allopurinol (ZYLOPRIM) 100 MG tablet Take 100 mg by mouth daily.   amLODipine (NORVASC) 10 MG tablet Take 10 mg by mouth daily.   aspirin EC 81 MG tablet Take 81 mg by mouth daily.   Biotin 10 MG TABS Take 10 mg by mouth daily.   calcium carbonate (OSCAL) 1500 (600 Ca) MG TABS tablet Take 600 mg of elemental calcium by mouth 3 (three) times daily with meals.  Cholecalciferol (VITAMIN D3) 2000 units TABS Take 1 tablet by mouth daily.   cloNIDine (CATAPRES) 0.1 MG tablet Take 0.1 mg by mouth at bedtime.   D-Mannose 500 MG CAPS Take 500 mg by mouth daily.   furosemide (LASIX) 20 MG tablet Take 1 tablet (20 mg total) by mouth once a week. (Patient taking differently: Take 20 mg by mouth daily.)   HYDROmorphone (DILAUDID) 4 MG tablet Take 1 tablet (4 mg total) by mouth every 6 (six) hours as needed for severe pain or moderate pain. Four times a day per patient report   lansoprazole (PREVACID) 30 MG capsule Take 1 capsule (30 mg total) by mouth daily before breakfast. 30 minutes before breakfast   latanoprost (XALATAN) 0.005 % ophthalmic solution Place 1 drop into both eyes at bedtime.   linaclotide (LINZESS) 290 MCG CAPS capsule Take 1 capsule (290 mcg total) by mouth daily before breakfast.   Magnesium 250 MG TABS Take 250 mg by mouth daily.   methocarbamol (ROBAXIN) 750 MG tablet Take 1 tablet (750 mg total) by mouth 3 (three) times daily.   nadolol (CORGARD) 40 MG tablet Take 60 mg by mouth daily.   naproxen (NAPROSYN) 500 MG tablet Take 500 mg by mouth 2 (two) times daily.   NARCAN 4 MG/0.1ML LIQD nasal spray kit Place 4 mg into the nose. I spray in both nares PRN   nitroGLYCERIN (NITROSTAT) 0.4 MG SL tablet Place 1 tablet (0.4 mg total) under the  tongue every 5 (five) minutes as needed for chest pain.   Omega-3 Fatty Acids (FISH OIL) 600 MG CAPS Take 600 mg by mouth 2 (two) times daily.   oxybutynin (DITROPAN) 5 MG tablet Take 5 mg by mouth 2 (two) times daily.   polyethylene glycol (MIRALAX / GLYCOLAX) 17 g packet Take 17 g by mouth daily as needed for mild constipation. (Patient taking differently: Take 17 g by mouth every other day.)   ranolazine (RANEXA) 500 MG 12 hr tablet Take 1 tablet (500 mg total) by mouth 2 (two) times daily.   rosuvastatin (CRESTOR) 20 MG tablet Take 20 mg by mouth at bedtime.   spironolactone-hydrochlorothiazide (ALDACTAZIDE) 25-25 MG tablet Take 1 tablet by mouth daily.   timolol (BETIMOL) 0.5 % ophthalmic solution Place 1 drop into both eyes daily. In the morning   trazodone (DESYREL) 300 MG tablet Take 300 mg by mouth at bedtime.   valsartan (DIOVAN) 80 MG tablet Take 80 mg by mouth daily.   Wheat Dextrin (BENEFIBER) POWD Take 1 packet by mouth daily. Clear and Sugar FREE. 1 tablespoon daily     Allergies:   Ciprofloxacin, Cyclobenzaprine, Pregabalin, Fluvastatin, Meloxicam, Metaxalone, Pramipexole, Pravastatin, Sertraline, Macrobid [nitrofurantoin], Amitriptyline, Arthrotec [diclofenac-misoprostol], Augmentin [amoxicillin-pot clavulanate], Bromfenac, Cephalexin, Conj estrog-medroxyprogest ace, Elemental sulfur, Erythromycin, Esomeprazole magnesium, Levofloxacin, Methadone, Metoclopramide, Other, Oxycodone, Pravachol [pravastatin sodium], Rofecoxib, Tramadol, and Zoloft [sertraline hcl]   Social History   Socioeconomic History   Marital status: Widowed    Spouse name: Not on file   Number of children: 2   Years of education: Not on file   Highest education level: Not on file  Occupational History   Occupation: Retired  Tobacco Use   Smoking status: Never   Smokeless tobacco: Never  Vaping Use   Vaping Use: Never used  Substance and Sexual Activity   Alcohol use: No   Drug use: No   Sexual  activity: Not on file  Other Topics Concern   Not on file  Social History Narrative  Not on file   Social Determinants of Health   Financial Resource Strain: Not on file  Food Insecurity: Not on file  Transportation Needs: Not on file  Physical Activity: Not on file  Stress: Not on file  Social Connections: Not on file     Family History: The patient's family history includes Diabetes in her mother; Prostate cancer in her father; Stroke in her sister. There is no history of Colon cancer, Esophageal cancer, Stomach cancer, or Rectal cancer. ROS:   Please see the history of present illness.    All other systems reviewed and are negative.  EKGs/Labs/Other Studies Reviewed:    The following studies were reviewed today:  EKG:  EKG ordered today and personally reviewed.  The ekg ordered today demonstrates sinus rhythm normal EKG  Recent Labs: See history  Physical Exam:    VS:  BP (!) 156/84 (BP Location: Left Arm, Patient Position: Sitting, Cuff Size: Large)   Pulse 68   Ht '5\' 4"'$  (1.626 m)   Wt 220 lb (99.8 kg)   SpO2 96%   BMI 37.76 kg/m     Wt Readings from Last 3 Encounters:  03/21/22 220 lb (99.8 kg)  08/23/21 205 lb (93 kg)  07/15/21 203 lb 6.4 oz (92.3 kg)     GEN:  Well nourished, well developed in no acute distress HEENT: Normal NECK: No JVD; No carotid bruits LYMPHATICS: No lymphadenopathy CARDIAC: RRR, no murmurs, rubs, gallops RESPIRATORY:  Clear to auscultation without rales, wheezing or rhonchi  ABDOMEN: Soft, non-tender, non-distended MUSCULOSKELETAL:  No edema; No deformity  SKIN: Warm and dry NEUROLOGIC:  Alert and oriented x 3 PSYCHIATRIC:  Normal affect    Signed, Shirlee More, MD  03/21/2022 2:41 PM    [place]

## 2022-03-21 ENCOUNTER — Encounter: Payer: Self-pay | Admitting: Cardiology

## 2022-03-21 ENCOUNTER — Ambulatory Visit: Payer: PPO | Attending: Cardiology | Admitting: Cardiology

## 2022-03-21 VITALS — BP 156/84 | HR 68 | Ht 64.0 in | Wt 220.0 lb

## 2022-03-21 DIAGNOSIS — I2510 Atherosclerotic heart disease of native coronary artery without angina pectoris: Secondary | ICD-10-CM | POA: Diagnosis not present

## 2022-03-21 DIAGNOSIS — I5032 Chronic diastolic (congestive) heart failure: Secondary | ICD-10-CM

## 2022-03-21 DIAGNOSIS — E782 Mixed hyperlipidemia: Secondary | ICD-10-CM

## 2022-03-21 DIAGNOSIS — I110 Hypertensive heart disease with heart failure: Secondary | ICD-10-CM | POA: Diagnosis not present

## 2022-03-21 MED ORDER — FUROSEMIDE 20 MG PO TABS: 20.0000 mg | ORAL_TABLET | ORAL | 3 refills | Status: AC

## 2022-03-21 NOTE — Patient Instructions (Signed)
Medication Instructions:  Your physician has recommended you make the following change in your medication:   START: Furosemide 20 mg weekly (Take furosemide daily if weight 213 lbs. Or greater)  *If you need a refill on your cardiac medications before your next appointment, please call your pharmacy*   Lab Work: None If you have labs (blood work) drawn today and your tests are completely normal, you will receive your results only by: [place] (if you have MyChart) OR A paper copy in the mail If you have any lab test that is abnormal or we need to change your treatment, we will call you to review the results.   Testing/Procedures: None   Follow-Up: At [facility], you and your health needs are our priority.  As part of our continuing mission to provide you with exceptional heart care, we have created designated Provider Care Teams.  These Care Teams include your primary Cardiologist (physician) and Advanced Practice Providers (APPs -  Physician Assistants and Nurse Practitioners) who all work together to provide you with the care you need, when you need it.  We recommend signing up for the patient portal called "MyChart".  Sign up information is provided on this After Visit Summary.  MyChart is used to connect with patients for Virtual Visits (Telemedicine).  Patients are able to view lab/test results, encounter notes, upcoming appointments, etc.  Non-urgent messages can be sent to your provider as well.   To learn more about what you can do with MyChart, go to [email or web address].    Your next appointment:   1 year(s)  Provider:   Shirlee More, MD    Other Instructions Check blood pressure daily and send a list of blood pressures to the office in 2 weeks.

## 2022-03-31 ENCOUNTER — Other Ambulatory Visit: Payer: Self-pay

## 2022-03-31 ENCOUNTER — Inpatient Hospital Stay (HOSPITAL_COMMUNITY)
Admission: EM | Admit: 2022-03-31 | Discharge: 2022-04-10 | DRG: 871 | Disposition: E | Payer: PPO | Attending: Internal Medicine | Admitting: Internal Medicine

## 2022-03-31 ENCOUNTER — Emergency Department (HOSPITAL_COMMUNITY): Payer: PPO

## 2022-03-31 ENCOUNTER — Encounter (HOSPITAL_COMMUNITY): Payer: Self-pay

## 2022-03-31 DIAGNOSIS — A4102 Sepsis due to Methicillin resistant Staphylococcus aureus: Principal | ICD-10-CM | POA: Diagnosis present

## 2022-03-31 DIAGNOSIS — Z833 Family history of diabetes mellitus: Secondary | ICD-10-CM

## 2022-03-31 DIAGNOSIS — R29810 Facial weakness: Secondary | ICD-10-CM | POA: Diagnosis present

## 2022-03-31 DIAGNOSIS — M5440 Lumbago with sciatica, unspecified side: Secondary | ICD-10-CM | POA: Diagnosis not present

## 2022-03-31 DIAGNOSIS — G936 Cerebral edema: Secondary | ICD-10-CM | POA: Diagnosis not present

## 2022-03-31 DIAGNOSIS — Z79891 Long term (current) use of opiate analgesic: Secondary | ICD-10-CM

## 2022-03-31 DIAGNOSIS — K219 Gastro-esophageal reflux disease without esophagitis: Secondary | ICD-10-CM | POA: Diagnosis present

## 2022-03-31 DIAGNOSIS — L89326 Pressure-induced deep tissue damage of left buttock: Secondary | ICD-10-CM | POA: Diagnosis present

## 2022-03-31 DIAGNOSIS — E569 Vitamin deficiency, unspecified: Secondary | ICD-10-CM | POA: Diagnosis present

## 2022-03-31 DIAGNOSIS — M545 Low back pain, unspecified: Secondary | ICD-10-CM | POA: Diagnosis present

## 2022-03-31 DIAGNOSIS — I63411 Cerebral infarction due to embolism of right middle cerebral artery: Secondary | ICD-10-CM

## 2022-03-31 DIAGNOSIS — Z883 Allergy status to other anti-infective agents status: Secondary | ICD-10-CM

## 2022-03-31 DIAGNOSIS — Z6837 Body mass index (BMI) 37.0-37.9, adult: Secondary | ICD-10-CM

## 2022-03-31 DIAGNOSIS — A419 Sepsis, unspecified organism: Secondary | ICD-10-CM

## 2022-03-31 DIAGNOSIS — N179 Acute kidney failure, unspecified: Secondary | ICD-10-CM | POA: Diagnosis not present

## 2022-03-31 DIAGNOSIS — I63511 Cerebral infarction due to unspecified occlusion or stenosis of right middle cerebral artery: Secondary | ICD-10-CM

## 2022-03-31 DIAGNOSIS — Z881 Allergy status to other antibiotic agents status: Secondary | ICD-10-CM

## 2022-03-31 DIAGNOSIS — G894 Chronic pain syndrome: Secondary | ICD-10-CM

## 2022-03-31 DIAGNOSIS — E669 Obesity, unspecified: Secondary | ICD-10-CM

## 2022-03-31 DIAGNOSIS — Z885 Allergy status to narcotic agent status: Secondary | ICD-10-CM

## 2022-03-31 DIAGNOSIS — I10 Essential (primary) hypertension: Secondary | ICD-10-CM | POA: Diagnosis present

## 2022-03-31 DIAGNOSIS — Y92009 Unspecified place in unspecified non-institutional (private) residence as the place of occurrence of the external cause: Secondary | ICD-10-CM

## 2022-03-31 DIAGNOSIS — I63542 Cerebral infarction due to unspecified occlusion or stenosis of left cerebellar artery: Secondary | ICD-10-CM | POA: Diagnosis not present

## 2022-03-31 DIAGNOSIS — G4733 Obstructive sleep apnea (adult) (pediatric): Secondary | ICD-10-CM | POA: Diagnosis present

## 2022-03-31 DIAGNOSIS — H409 Unspecified glaucoma: Secondary | ICD-10-CM | POA: Diagnosis present

## 2022-03-31 DIAGNOSIS — Z66 Do not resuscitate: Secondary | ICD-10-CM | POA: Diagnosis present

## 2022-03-31 DIAGNOSIS — G9341 Metabolic encephalopathy: Secondary | ICD-10-CM | POA: Diagnosis present

## 2022-03-31 DIAGNOSIS — Z853 Personal history of malignant neoplasm of breast: Secondary | ICD-10-CM

## 2022-03-31 DIAGNOSIS — N390 Urinary tract infection, site not specified: Principal | ICD-10-CM

## 2022-03-31 DIAGNOSIS — N3000 Acute cystitis without hematuria: Secondary | ICD-10-CM

## 2022-03-31 DIAGNOSIS — Z886 Allergy status to analgesic agent status: Secondary | ICD-10-CM

## 2022-03-31 DIAGNOSIS — Z1152 Encounter for screening for COVID-19: Secondary | ICD-10-CM

## 2022-03-31 DIAGNOSIS — Z85828 Personal history of other malignant neoplasm of skin: Secondary | ICD-10-CM

## 2022-03-31 DIAGNOSIS — F209 Schizophrenia, unspecified: Secondary | ICD-10-CM | POA: Diagnosis present

## 2022-03-31 DIAGNOSIS — R4182 Altered mental status, unspecified: Secondary | ICD-10-CM | POA: Diagnosis not present

## 2022-03-31 DIAGNOSIS — M4802 Spinal stenosis, cervical region: Secondary | ICD-10-CM | POA: Diagnosis present

## 2022-03-31 DIAGNOSIS — W1830XA Fall on same level, unspecified, initial encounter: Secondary | ICD-10-CM | POA: Diagnosis present

## 2022-03-31 DIAGNOSIS — W19XXXA Unspecified fall, initial encounter: Secondary | ICD-10-CM

## 2022-03-31 DIAGNOSIS — Z823 Family history of stroke: Secondary | ICD-10-CM

## 2022-03-31 DIAGNOSIS — F1120 Opioid dependence, uncomplicated: Secondary | ICD-10-CM | POA: Diagnosis present

## 2022-03-31 DIAGNOSIS — R6520 Severe sepsis without septic shock: Secondary | ICD-10-CM

## 2022-03-31 DIAGNOSIS — Z7982 Long term (current) use of aspirin: Secondary | ICD-10-CM

## 2022-03-31 DIAGNOSIS — E860 Dehydration: Secondary | ICD-10-CM | POA: Diagnosis present

## 2022-03-31 DIAGNOSIS — B9620 Unspecified Escherichia coli [E. coli] as the cause of diseases classified elsewhere: Secondary | ICD-10-CM | POA: Diagnosis present

## 2022-03-31 DIAGNOSIS — E66811 Obesity, class 1: Secondary | ICD-10-CM | POA: Diagnosis present

## 2022-03-31 DIAGNOSIS — R627 Adult failure to thrive: Secondary | ICD-10-CM | POA: Diagnosis present

## 2022-03-31 DIAGNOSIS — Z888 Allergy status to other drugs, medicaments and biological substances status: Secondary | ICD-10-CM

## 2022-03-31 DIAGNOSIS — Y92008 Other place in unspecified non-institutional (private) residence as the place of occurrence of the external cause: Secondary | ICD-10-CM

## 2022-03-31 DIAGNOSIS — Z79899 Other long term (current) drug therapy: Secondary | ICD-10-CM

## 2022-03-31 DIAGNOSIS — E7800 Pure hypercholesterolemia, unspecified: Secondary | ICD-10-CM | POA: Diagnosis present

## 2022-03-31 DIAGNOSIS — Z515 Encounter for palliative care: Secondary | ICD-10-CM

## 2022-03-31 DIAGNOSIS — I2510 Atherosclerotic heart disease of native coronary artery without angina pectoris: Secondary | ICD-10-CM | POA: Diagnosis present

## 2022-03-31 DIAGNOSIS — Z7983 Long term (current) use of bisphosphonates: Secondary | ICD-10-CM

## 2022-03-31 DIAGNOSIS — G8194 Hemiplegia, unspecified affecting left nondominant side: Secondary | ICD-10-CM | POA: Diagnosis not present

## 2022-03-31 DIAGNOSIS — R29721 NIHSS score 21: Secondary | ICD-10-CM | POA: Diagnosis present

## 2022-03-31 LAB — CBC WITH DIFFERENTIAL/PLATELET
Abs Immature Granulocytes: 0.39 10*3/uL — ABNORMAL HIGH (ref 0.00–0.07)
Basophils Absolute: 0.1 10*3/uL (ref 0.0–0.1)
Basophils Relative: 1 %
Eosinophils Absolute: 0 10*3/uL (ref 0.0–0.5)
Eosinophils Relative: 0 %
HCT: 38.9 % (ref 36.0–46.0)
Hemoglobin: 12.5 g/dL (ref 12.0–15.0)
Immature Granulocytes: 2 %
Lymphocytes Relative: 3 %
Lymphs Abs: 0.6 10*3/uL — ABNORMAL LOW (ref 0.7–4.0)
MCH: 29.4 pg (ref 26.0–34.0)
MCHC: 32.1 g/dL (ref 30.0–36.0)
MCV: 91.5 fL (ref 80.0–100.0)
Monocytes Absolute: 0.5 10*3/uL (ref 0.1–1.0)
Monocytes Relative: 2 %
Neutro Abs: 21.7 10*3/uL — ABNORMAL HIGH (ref 1.7–7.7)
Neutrophils Relative %: 92 %
Platelets: 164 10*3/uL (ref 150–400)
RBC: 4.25 MIL/uL (ref 3.87–5.11)
RDW: 14 % (ref 11.5–15.5)
WBC Morphology: INCREASED
WBC: 23.3 10*3/uL — ABNORMAL HIGH (ref 4.0–10.5)
nRBC: 0 % (ref 0.0–0.2)

## 2022-03-31 LAB — URINALYSIS, ROUTINE W REFLEX MICROSCOPIC
Bilirubin Urine: NEGATIVE
Glucose, UA: NEGATIVE mg/dL
Ketones, ur: NEGATIVE mg/dL
Nitrite: NEGATIVE
Protein, ur: 100 mg/dL — AB
Specific Gravity, Urine: 1.016 (ref 1.005–1.030)
pH: 5 (ref 5.0–8.0)

## 2022-03-31 LAB — COMPREHENSIVE METABOLIC PANEL
ALT: 17 U/L (ref 0–44)
AST: 39 U/L (ref 15–41)
Albumin: 3.1 g/dL — ABNORMAL LOW (ref 3.5–5.0)
Alkaline Phosphatase: 73 U/L (ref 38–126)
Anion gap: 14 (ref 5–15)
BUN: 51 mg/dL — ABNORMAL HIGH (ref 8–23)
CO2: 25 mmol/L (ref 22–32)
Calcium: 8.7 mg/dL — ABNORMAL LOW (ref 8.9–10.3)
Chloride: 97 mmol/L — ABNORMAL LOW (ref 98–111)
Creatinine, Ser: 2.19 mg/dL — ABNORMAL HIGH (ref 0.44–1.00)
GFR, Estimated: 22 mL/min — ABNORMAL LOW (ref >60)
Glucose, Bld: 177 mg/dL — ABNORMAL HIGH (ref 70–99)
Potassium: 3.6 mmol/L (ref 3.5–5.1)
Sodium: 136 mmol/L (ref 135–145)
Total Bilirubin: 0.7 mg/dL (ref 0.3–1.2)
Total Protein: 7.6 g/dL (ref 6.5–8.1)

## 2022-03-31 LAB — LACTIC ACID, PLASMA: Lactic Acid, Venous: 2.4 mmol/L (ref 0.5–1.9)

## 2022-03-31 LAB — AMMONIA: Ammonia: 19 umol/L (ref 9–35)

## 2022-03-31 LAB — CK: Total CK: 258 U/L — ABNORMAL HIGH (ref 38–234)

## 2022-03-31 MED ORDER — ONDANSETRON HCL 4 MG PO TABS: 4.0000 mg | ORAL_TABLET | Freq: Four times a day (QID) | ORAL | Status: DC | PRN

## 2022-03-31 MED ORDER — HYDROMORPHONE HCL 2 MG PO TABS: 2.0000 mg | ORAL_TABLET | Freq: Four times a day (QID) | ORAL | Status: DC | PRN

## 2022-03-31 MED ORDER — SODIUM CHLORIDE 0.9 % IV SOLN
2.0000 g | INTRAVENOUS | Status: DC
  Administered 2022-04-01: 2 g via INTRAVENOUS
  Filled 2022-03-31: qty 20

## 2022-03-31 MED ORDER — SODIUM CHLORIDE 0.9 % IV BOLUS
500.0000 mL | Freq: Once | INTRAVENOUS | Status: AC
  Administered 2022-03-31: 500 mL via INTRAVENOUS

## 2022-03-31 MED ORDER — ONDANSETRON HCL 4 MG/2ML IJ SOLN: 4.0000 mg | Freq: Four times a day (QID) | INTRAMUSCULAR | Status: DC | PRN

## 2022-03-31 MED ORDER — LACTATED RINGERS IV SOLN: INTRAVENOUS | Status: AC

## 2022-03-31 MED ORDER — ACETAMINOPHEN 325 MG PO TABS: 650.0000 mg | ORAL_TABLET | Freq: Four times a day (QID) | ORAL | Status: DC | PRN

## 2022-03-31 MED ORDER — ASPIRIN 81 MG PO TBEC: 81.0000 mg | DELAYED_RELEASE_TABLET | Freq: Every day | ORAL | Status: DC

## 2022-03-31 MED ORDER — SODIUM CHLORIDE 0.9 % IV SOLN
2.0000 g | Freq: Once | INTRAVENOUS | Status: AC
  Administered 2022-03-31: 2 g via INTRAVENOUS
  Filled 2022-03-31: qty 20

## 2022-03-31 MED ORDER — SODIUM CHLORIDE 0.9 % IV BOLUS
1000.0000 mL | Freq: Once | INTRAVENOUS | Status: AC
  Administered 2022-03-31: 1000 mL via INTRAVENOUS

## 2022-03-31 MED ORDER — ACETAMINOPHEN 650 MG RE SUPP: 650.0000 mg | Freq: Four times a day (QID) | RECTAL | Status: DC | PRN

## 2022-03-31 MED ORDER — PROCHLORPERAZINE EDISYLATE 10 MG/2ML IJ SOLN
5.0000 mg | Freq: Once | INTRAMUSCULAR | Status: AC
  Administered 2022-03-31: 5 mg via INTRAVENOUS
  Filled 2022-03-31: qty 2

## 2022-03-31 MED ORDER — HEPARIN SODIUM (PORCINE) 5000 UNIT/ML IJ SOLN
5000.0000 [IU] | Freq: Three times a day (TID) | INTRAMUSCULAR | Status: DC
  Administered 2022-03-31 – 2022-04-01 (×2): 5000 [IU] via SUBCUTANEOUS
  Filled 2022-03-31 (×2): qty 1

## 2022-03-31 MED ORDER — ONDANSETRON HCL 4 MG/2ML IJ SOLN
4.0000 mg | Freq: Once | INTRAMUSCULAR | Status: AC
  Administered 2022-03-31: 4 mg via INTRAVENOUS
  Filled 2022-03-31: qty 2

## 2022-03-31 MED ORDER — HYDROMORPHONE HCL 1 MG/ML IJ SOLN
1.0000 mg | Freq: Once | INTRAMUSCULAR | Status: AC
  Administered 2022-03-31: 1 mg via INTRAVENOUS
  Filled 2022-03-31: qty 1

## 2022-03-31 NOTE — ED Triage Notes (Addendum)
Patient BIB [facility] from home. Had an unwitnessed fall. Daughter said patient has been nauseous and vomiting over the last 24 hours. History of UTI's. Daughter said she was not acting like herself.  Patient alert to self, location, but does not know why she is here or what year it is.

## 2022-03-31 NOTE — ED Notes (Signed)
Patient with reported fall at home today with incontinence.  She had n/v/d as well.  Patient lives with her daugther.  She was at baseline on yesterday prior to going to bed.  Patient is now confused, strong smell of urine noted.  Patient has contusion to her cheek on the right side.  Patient complains of pain all over,  she has pain in her neck with movement.  C collar placed for immobilization.  She has no noted bruising to hips.  She does have a small bruise to her left knee.

## 2022-03-31 NOTE — Assessment & Plan Note (Signed)
Continue with IV Rocephin 2 gm daily. Await urine cx.

## 2022-03-31 NOTE — Assessment & Plan Note (Signed)
Due to UTI and AKI. With WBC of 23.3, HR 107, UA with many bacteria and leukocyte esterase.

## 2022-03-31 NOTE — Assessment & Plan Note (Signed)
Admit to obs bed. Continue with IVF. Hold nephrotoxic agents. Repeat CMP in AM.

## 2022-03-31 NOTE — Assessment & Plan Note (Addendum)
Dtr states pt walks with a walker at home. CK only mildly elevated. Continue with IVF. Repeat CK in AM. PT consult. Pt lives at home with dtr [name].

## 2022-03-31 NOTE — Assessment & Plan Note (Signed)
Chronic. On long term oral dilaudid by pain clinic([facility]) 2 mg qid.

## 2022-03-31 NOTE — H&P (Signed)
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  Patient lives with her daughter.  Patient was on the ground for several hours.  Daughter states the patient started having diarrhea about 2 days ago.  Started having vomiting about a day ago.  Unable to keep any of her oral pain medications down.  She has had a history of narcotic withdrawal in April 2023 after being unable to obtain refills on her chronic prescriptions.  Patient arrived by EMS.  Patient is confused.  Patient was incontinent of urine.  She may have urinated on herself after she fell.  On arrival temp 97.9 heart rate 83 blood pressure 140/85 satting 90% on room air.  After dose of IV Dilaudid, her room air saturations dropped to 84% with patient started on supplemental oxygen.  Labs showed a white count of 23.3, hemoglobin 12.5, platelets of 164 UA was positive for leukocyte esterase and many bacteria.  Sodium 136, BUN of 51, creatinine 2.19, glucose 177  Total CK of 258  CT on pelvis demonstrated no acute process.  CT cervical spine demonstrated no acute cervical spine fractures.  She has severe right neuroforaminal stenosis at C5-6  CT head showed chronic small vessel ischemic disease in the periventricular white matter.  [facility] contacted for admission.   ED Course: CT abd negative. CT c-spine, CT head negative. EDP cleared pt to remove hard c-collar. Pt wants to keep c-collar on.  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   Dry eyes    Dysuria    Elevated cholesterol    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   Gallstones    GERD (gastroesophageal reflux disease)    Glaucoma    both eyes   Gout    Hypertension    IBS (irritable bowel syndrome)    Idiopathic peripheral neuropathy 06/11/2015   Infection of lumbar spine (HCC)    Ingrown nail of great toe of right foot 05/04/2017   Insomnia    Intractable low back pain 07/13/2017   Low back pain 08/27/2017   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   Nausea without vomiting    Obesity    Obesity (BMI 30.0-34.9) 02/13/2020   Pain syndrome, chronic 11/29/2015   Paronychia of great toe, right 05/04/2017   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/26/2010   3.6 cm   Postoperative  anemia due to acute blood loss 08/29/2017   Psychophysiological insomnia 04/03/2015   Radiculopathy 05/04/2014   Radiculopathy of lumbar region 11/29/2015   Risk for falls 06/11/2015   Schizophrenia ([place])    Sleep apnea    USES CPAP    Sleep apnea, obstructive 04/03/2015   Urticaria    Vitamin deficiency     Past Surgical History:  Procedure Laterality Date   ABDOMINAL EXPOSURE N/A 05/04/2014   Procedure: ABDOMINAL EXPOSURE;  Surgeon: Serafina Mitchell, MD;  Location: [facility];  Service: Vascular;  Laterality: N/A;   ANTERIOR LUMBAR FUSION N/A 05/04/2014   Procedure: ANTERIOR LUMBAR FUSION 1 LEVEL;  Surgeon: Sinclair Ship, MD;   Location: [facility];  Service: Orthopedics;  Laterality: N/A;  Lumbar 5-sacrum 1 anterior lumbar interbody fusion with instrumentation and allograft   ANTERIOR LUMBAR FUSION  07/2017   ANTERIOR LUMBAR FUSION  08/2017   BACK SURGERY     2010 ,2015LUMB FUSION, 06/2013 [facility] FUSION   BACK SURGERY  07/2017   Lumbar   BREAST BIOPSY  2013   pt said it was positive    BREAST SURGERY     LEFT BRREAST BX   CARPAL TUNNEL RELEASE     1994 RT   CARPAL TUNNEL RELEASE Bilateral    CATARACT EXTRACTION, BILATERAL  2017   CHOLECYSTECTOMY     2004   COLONOSCOPY W/ BIOPSIES     Colonoscopy 1027,2536, 2009, 2011   ERCP     2009   ESOPHAGOGASTRODUODENOSCOPY  08/09/2017   Small hiatal hernia. Mild gastritis. Incidenta; duodenal diverticulum. Incidental gastric polyp.    LEEP     1992 and 1994   SKIN CANCER EXCISION     X 4    thorocolumbar fusion  06/2013   TUBAL LIGATION       reports that she has never smoked. She has never used smokeless tobacco. She reports that she does not drink alcohol and does not use drugs.  Allergies  Allergen Reactions   Ciprofloxacin Swelling    Tongue swells   Cyclobenzaprine Swelling    Feet swell    Pregabalin Anaphylaxis, Shortness Of Breath, Swelling and Other (See Comments)    Dizziness also   Fluvastatin Other (See Comments)    Leg pain    Meloxicam Swelling    Feet became swollen   Metaxalone Rash and Other (See Comments)    Flushing of the skin and sore throat, too   Pramipexole Nausea And Vomiting   Pravastatin Other (See Comments)    Leg pain   Sertraline Other (See Comments)    Sensitive teeth   Macrobid [Nitrofurantoin] Diarrhea and Nausea Only   Amitriptyline Other (See Comments)    Weird feeling all over   Arthrotec [Diclofenac-Misoprostol] Diarrhea   Augmentin [Amoxicillin-Pot Clavulanate] Diarrhea    Has patient had a PCN reaction causing immediate rash, facial/tongue/throat swelling, SOB or lightheadedness with hypotension: No Has  patient had a PCN reaction causing severe rash involving mucus membranes or skin necrosis: No Has patient had a PCN reaction that required hospitalization: No Has patient had a PCN reaction occurring within the last 10 years: No If all of the above answers are "NO", then may proceed with Cephalosporin use.    Bromfenac Diarrhea and Nausea Only   Cephalexin Diarrhea and Nausea Only   Conj Estrog-Medroxyprogest Ace Other (See Comments)    Unsure of reaction type Couldn't tolerate   Elemental Sulfur Diarrhea and Nausea Only   Erythromycin Diarrhea and Nausea  Only   Esomeprazole Magnesium Nausea Only   Levofloxacin Other (See Comments) and Nausea Only    Stomach upset   Methadone Diarrhea   Metoclopramide Diarrhea and Nausea Only   Other Other (See Comments)    Darvocet   Oxycodone Hives, Swelling and Rash   Pravachol [Pravastatin Sodium] Other (See Comments)    Upper leg pain   Rofecoxib Diarrhea and Nausea Only   Tramadol Diarrhea   Zoloft [Sertraline Hcl] Other (See Comments)    Sensitivity with teeth    Family History  Problem Relation Age of Onset   Diabetes Mother        had a history but not anymore   Prostate cancer Father    Stroke Sister        has a brain tumor   Colon cancer Neg Hx    Esophageal cancer Neg Hx    Stomach cancer Neg Hx    Rectal cancer Neg Hx     Prior to Admission medications   Medication Sig Start Date End Date Taking? Authorizing Provider  alendronate (FOSAMAX) 70 MG tablet Take 70 mg by mouth every Thursday. Take with a full glass of water on an empty stomach.    [provider]  allopurinol (ZYLOPRIM) 100 MG tablet Take 100 mg by mouth daily.    [provider]  amLODipine (NORVASC) 10 MG tablet Take 10 mg by mouth daily. 05/04/19   [provider]  aspirin EC 81 MG tablet Take 81 mg by mouth daily.    [provider]  Biotin 10 MG TABS Take 10 mg by mouth daily.    [provider]  calcium  carbonate (OSCAL) 1500 (600 Ca) MG TABS tablet Take 600 mg of elemental calcium by mouth 3 (three) times daily with meals.    [provider]  Cholecalciferol (VITAMIN D3) 2000 units TABS Take 1 tablet by mouth daily.    [provider]  cloNIDine (CATAPRES) 0.1 MG tablet Take 0.1 mg by mouth at bedtime.    [provider]  D-Mannose 500 MG CAPS Take 500 mg by mouth daily.    [provider]  furosemide (LASIX) 20 MG tablet Take 1 tablet (20 mg total) by mouth once a week. Take Furosemide daily if weight 213 lbs. or greater. 03/21/22   Richardo Priest, MD  HYDROmorphone (DILAUDID) 4 MG tablet Take 1 tablet (4 mg total) by mouth every 6 (six) hours as needed for severe pain or moderate pain. Four times a day per patient report 07/04/21   Lorin Glass, PA-C  lansoprazole (PREVACID) 30 MG capsule Take 1 capsule (30 mg total) by mouth daily before breakfast. 30 minutes before breakfast 01/17/19   Aline August, MD  latanoprost (XALATAN) 0.005 % ophthalmic solution Place 1 drop into both eyes at bedtime.    [provider]  linaclotide Rolan Lipa) 290 MCG CAPS capsule Take 1 capsule (290 mcg total) by mouth daily before breakfast. 11/27/17   Jackquline Denmark, MD  Magnesium 250 MG TABS Take 250 mg by mouth daily.    [provider]  methocarbamol (ROBAXIN) 750 MG tablet Take 1 tablet (750 mg total) by mouth 3 (three) times daily. 07/04/21   Lorin Glass, PA-C  nadolol (CORGARD) 40 MG tablet Take 60 mg by mouth daily.    [provider]  naproxen (NAPROSYN) 500 MG tablet Take 500 mg by mouth 2 (two) times daily. 03/07/22   [provider]  NARCAN 4 MG/0.1ML  LIQD nasal spray kit Place 4 mg into the nose. I spray in both nares PRN 07/08/19   [provider]  nitroGLYCERIN (NITROSTAT) 0.4 MG SL tablet Place 1 tablet (0.4 mg total) under the tongue every 5 (five) minutes as needed for chest pain. 01/17/19   Aline August, MD   Omega-3 Fatty Acids (FISH OIL) 600 MG CAPS Take 600 mg by mouth 2 (two) times daily.    [provider]  oxybutynin (DITROPAN) 5 MG tablet Take 5 mg by mouth 2 (two) times daily.    [provider]  polyethylene glycol (MIRALAX / GLYCOLAX) 17 g packet Take 17 g by mouth daily as needed for mild constipation. Patient taking differently: Take 17 g by mouth every other day. 01/17/19   Aline August, MD  ranolazine (RANEXA) 500 MG 12 hr tablet Take 1 tablet (500 mg total) by mouth 2 (two) times daily. 08/19/21   Tobb, Kardie, DO  rosuvastatin (CRESTOR) 20 MG tablet Take 20 mg by mouth at bedtime. 04/16/21   [provider]  spironolactone-hydrochlorothiazide (ALDACTAZIDE) 25-25 MG tablet Take 1 tablet by mouth daily. 01/17/21   [provider]  timolol (BETIMOL) 0.5 % ophthalmic solution Place 1 drop into both eyes daily. In the morning    [provider]  trazodone (DESYREL) 300 MG tablet Take 300 mg by mouth at bedtime. 07/10/19   [provider]  valsartan (DIOVAN) 80 MG tablet Take 80 mg by mouth daily.    [provider]  Wheat Dextrin (BENEFIBER) POWD Take 1 packet by mouth daily. Clear and Sugar FREE. 1 tablespoon daily    [provider]    Physical Exam: Vitals:   03/18/2022 2022 03/14/2022 2024 03/17/2022 2030 03/13/2022 2059  BP:   (!) 126/51 (!) 113/50  Pulse: (!) 103 100 97 95  Resp: (!) 29 (!) 26 (!) 32 (!) 26  Temp:    97.8 F (36.6 C)  TempSrc:    Oral  SpO2: (!) 84% 93% 97% 95%    Physical Exam Vitals and nursing note reviewed.  Constitutional:      Appearance: She is obese.     Comments: Appears chronically ill Sedated. Snoring.  HENT:     Head: Normocephalic and atraumatic.  Eyes:     Pupils: Pupils are equal, round, and reactive to light.  Cardiovascular:     Rate and Rhythm: Normal rate and regular rhythm.     Pulses: Normal pulses.  Pulmonary:     Effort: No respiratory distress.     Comments:  Snoring  Abdominal:     General: Abdomen is protuberant. There is no distension.     Tenderness: There is no abdominal tenderness.  Musculoskeletal:     Right lower leg: No edema.     Left lower leg: No edema.  Skin:    General: Skin is warm and dry.  Neurological:     Comments: sedated      Labs on Admission: I have personally reviewed following labs and imaging studies  CBC: Recent Labs  Lab 03/17/2022 1344  WBC 23.3*  NEUTROABS 21.7*  HGB 12.5  HCT 38.9  MCV 91.5  PLT 295   Basic Metabolic Panel: Recent Labs  Lab 03/21/2022 1344  NA 136  K 3.6  CL 97*  CO2 25  GLUCOSE 177*  BUN 51*  CREATININE 2.19*  CALCIUM 8.7*   GFR: Estimated Creatinine Clearance: 23.5 mL/min (A) (by C-G formula based on SCr of 2.19 mg/dL (H)).  Liver Function Tests: Recent Labs  Lab 03/28/2022 1344  AST 39  ALT 17  ALKPHOS 73  BILITOT 0.7  PROT 7.6  ALBUMIN 3.1*   No results for input(s): "LIPASE", "AMYLASE" in the last 168 hours. Recent Labs  Lab 04/08/2022 1345  AMMONIA 19   Coagulation Profile: No results for input(s): "INR", "PROTIME" in the last 168 hours. Cardiac Enzymes: Recent Labs  Lab 03/15/2022 1344  CKTOTAL 258*   BNP (last 3 results) No results for input(s): "PROBNP" in the last 8760 hours. HbA1C: No results for input(s): "HGBA1C" in the last 72 hours. CBG: No results for input(s): "GLUCAP" in the last 168 hours. Lipid Profile: No results for input(s): "CHOL", "HDL", "LDLCALC", "TRIG", "CHOLHDL", "LDLDIRECT" in the last 72 hours. Thyroid Function Tests: No results for input(s): "TSH", "T4TOTAL", "FREET4", "T3FREE", "THYROIDAB" in the last 72 hours. Anemia Panel: No results for input(s): "VITAMINB12", "FOLATE", "FERRITIN", "TIBC", "IRON", "RETICCTPCT" in the last 72 hours. Urine analysis:    Component Value Date/Time   COLORURINE YELLOW 03/27/2022 1345   APPEARANCEUR CLOUDY (A) 04/08/2022 1345   LABSPEC 1.016 03/30/2022 1345   PHURINE 5.0 03/20/2022 1345    GLUCOSEU NEGATIVE 03/13/2022 1345   HGBUR MODERATE (A) 03/12/2022 1345   BILIRUBINUR NEGATIVE 03/11/2022 1345   KETONESUR NEGATIVE 03/19/2022 1345   PROTEINUR 100 (A) 03/27/2022 1345   UROBILINOGEN 0.2 04/25/2014 1234   NITRITE NEGATIVE 03/30/2022 1345   LEUKOCYTESUR SMALL (A) 04/01/2022 1345    Radiological Exams on Admission: I have personally reviewed images CT ABDOMEN PELVIS WO CONTRAST  Result Date: 03/18/2022 CLINICAL DATA:  Nausea and vomiting for the past 24 hours. EXAM: CT ABDOMEN AND PELVIS WITHOUT CONTRAST TECHNIQUE: Multidetector CT imaging of the abdomen and pelvis was performed following the standard protocol without IV contrast. RADIATION DOSE REDUCTION: This exam was performed according to the departmental dose-optimization program which includes automated exposure control, adjustment of the mA and/or kV according to patient size and/or use of iterative reconstruction technique. COMPARISON:  CT abdomen pelvis dated Jul 21, 2019. FINDINGS: Lower chest: Cardiomegaly. Unchanged 3.6 cm cyst at the right cardiophrenic angle. Mild scarring at the lung bases. No acute abnormality. Hepatobiliary: No focal liver abnormality is seen. Status post cholecystectomy. No biliary dilatation. Pancreas: Atrophic. No ductal dilatation or surrounding inflammatory changes. Spleen: Normal in size without focal abnormality. Adrenals/Urinary Tract: Adrenal glands are unremarkable. No renal mass, calculus, or hydronephrosis. The bladder is unremarkable. Stomach/Bowel: Stomach is within normal limits. Appendix appears normal. No evidence of bowel wall thickening, distention, or inflammatory changes. Small stool ball in the rectum. Vascular/Lymphatic: Aortic atherosclerosis. No enlarged abdominal or pelvic lymph nodes. Reproductive: Uterus and bilateral adnexa are unremarkable. Other: No free fluid or pneumoperitoneum. Musculoskeletal: No acute or significant osseous findings. Chronic T12, L1, and L2 compression  deformities again noted. Prior thoracolumbar fusion. IMPRESSION: 1. No acute intra-abdominal process. 2. Small stool ball in the rectum. Correlate for fecal impaction. 3.  Aortic Atherosclerosis (ICD10-I70.0). Electronically Signed   By: Titus Dubin M.D.   On: 03/29/2022 18:36   DG Chest Portable 1 View  Result Date: 04/05/2022 CLINICAL DATA:  Weakness EXAM: PORTABLE CHEST 1 VIEW COMPARISON:  Two-view x-ray 07/04/2021 and older FINDINGS: Fixation hardware along the thoracolumbar spine. Calcified aorta. Enlarged cardiopericardial silhouette. No consolidation or pneumothorax. Mild linear opacity left lung base likely scar or atelectasis. Interstitial changes are seen, possibly chronic. Film is rotated to the left. Overlapping cardiac leads. IMPRESSION: Mild left basilar scar atelectasis. Enlarged heart with some interstitial changes.  Acute versus chronic process but could be chronic. Recommend follow-up Electronically Signed   By: Jill Side M.D.   On: 03/21/2022 17:59   CT Cervical Spine Wo Contrast  Result Date: 04/05/2022 CLINICAL DATA:  Neck trauma EXAM: CT CERVICAL SPINE WITHOUT CONTRAST TECHNIQUE: Multidetector CT imaging of the cervical spine was performed without intravenous contrast. Multiplanar CT image reconstructions were also generated. RADIATION DOSE REDUCTION: This exam was performed according to the departmental dose-optimization program which includes automated exposure control, adjustment of the mA and/or kV according to patient size and/or use of iterative reconstruction technique. COMPARISON:  None Available. FINDINGS: Alignment: No listhesis. Skull base and vertebrae: No acute fracture or suspicious osseous lesion. Osseous fusion across the left facets of C2 and C3, likely degenerative. Soft tissues and spinal canal: No prevertebral fluid or swelling. No visible canal hematoma. Disc levels: Mild degenerative changes throughout the cervical spine, with mild spinal canal stenosis at  C6-C7. Multilevel uncovertebral and facet arthropathy, with severe right neural foraminal narrowing at C5-C6. Upper chest: No focal pulmonary opacity or pleural effusion. Other: None. IMPRESSION: No acute cervical spine fracture or traumatic listhesis. Electronically Signed   By: Merilyn Baba M.D.   On: 03/11/2022 14:57   CT Head Wo Contrast  Result Date: 03/21/2022 CLINICAL DATA:  Mental status change, unknown cause EXAM: CT HEAD WITHOUT CONTRAST TECHNIQUE: Contiguous axial images were obtained from the base of the skull through the vertex without intravenous contrast. RADIATION DOSE REDUCTION: This exam was performed according to the departmental dose-optimization program which includes automated exposure control, adjustment of the mA and/or kV according to patient size and/or use of iterative reconstruction technique. COMPARISON:  None Available. FINDINGS: Brain: There is periventricular white matter decreased attenuation consistent with small vessel ischemic changes. Gray-white differentiation is preserved. No acute intracranial hemorrhage, mass effect or shift. No hydrocephalus. Vascular: No hyperdense vessel or unexpected calcification. Skull: Normal. Negative for fracture or focal lesion. Sinuses/Orbits: No acute finding. IMPRESSION: Periventricular white matter changes consistent with chronic small vessel ischemia. No acute intracranial process identified. Electronically Signed   By: Sammie Bench M.D.   On: 03/19/2022 14:51    EKG: My personal interpretation of EKG shows: no EKG to review.  Assessment/Plan Principal Problem:   AKI (acute kidney injury) ([place]) Active Problems:   Acute cystitis   Low back pain   Pain syndrome, chronic   Obesity   Schizophrenia (HCC)   Obesity (BMI 30.0-34.9)   Chronic use of opiate drug for therapeutic purpose   Sepsis with acute organ dysfunction without septic shock ([place])   Fall at home, initial encounter    Assessment and Plan: * AKI (acute  kidney injury) ([place]) Admit to obs bed. Continue with IVF. Hold nephrotoxic agents. Repeat CMP in AM.  Acute cystitis Continue with IV Rocephin 2 gm daily. Await urine cx.  Chronic use of opiate drug for therapeutic purpose On long term oral dilaudid by pain clinic([facility]) 2 mg qid.  Obesity (BMI 30.0-34.9) Chronic.  Schizophrenia ([place]) Chronic.  Obesity Chronic.  Pain syndrome, chronic Chronic. On long term oral dilaudid by pain clinic([facility]) 2 mg qid.  Low back pain Chronic. On long term oral dilaudid by pain clinic([facility]) 2 mg qid.  Fall at home, initial encounter Dtr states pt walks with a walker at home. CK only mildly elevated. Continue with IVF. Repeat CK in AM. PT consult. Pt lives at home with dtr [name].  Sepsis with acute organ dysfunction without septic  shock (HCC) Due to UTI and AKI. With WBC of 23.3, HR 107, UA with many bacteria and leukocyte esterase.   DVT prophylaxis: SQ Heparin Code Status: Full Code Family Communication: discussed with pt's dtr jennifer  Disposition Plan: return home +/- HH  Consults called: none  Admission status: Observation, Med-Surg   Kristopher Oppenheim, DO [facility] 03/10/2022, 9:41 PM

## 2022-03-31 NOTE — Assessment & Plan Note (Signed)
On long term oral dilaudid by pain clinic([facility]) 2 mg qid.

## 2022-03-31 NOTE — ED Notes (Signed)
Patient has been taken to CT

## 2022-03-31 NOTE — Assessment & Plan Note (Signed)
Chronic. 

## 2022-03-31 NOTE — Assessment & Plan Note (Addendum)
Chronic. On long term oral dilaudid by pain clinic([facility]) 2 mg qid.

## 2022-03-31 NOTE — ED Provider Triage Note (Signed)
Emergency Medicine Provider Triage Evaluation Note  Karen Castillo , a 82 y.o. female  was evaluated in triage.  Pt complains of AMS. Unwitness fall, unknown. Per nusring note the patient has been having nausea and vomiting. Her belly is non tender on my exam. She is well appearing. Unknown last known well.   History limited as EMS is no longer present and the patient is pleasantly altered.  Review of Systems  Positive:  Negative:   Physical Exam  BP (!) 140/85 (BP Location: Right Arm)   Pulse 83   Temp 97.9 F (36.6 C) (Oral)   Resp 18   SpO2 93%  Gen:   Awake, no distress   Resp:  Normal effort  MSK:   Moves extremities without difficulty  Other:  Abdomen Soft. Non tender. Cranial nerves grossly intact. Sensation grossly intact.   Medical Decision Making  Medically screening exam initiated at 1:46 PM.  Appropriate orders placed.  Karen Castillo was informed that the remainder of the evaluation will be completed by another provider, this initial triage assessment does not replace that evaluation, and the importance of remaining in the ED until their evaluation is complete.  CT head and neck ordered as well as labs.    Sherrell Puller, [place] 03/24/2022 1349

## 2022-03-31 NOTE — Subjective & Objective (Signed)
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  Patient lives with her daughter.  Patient was on the ground for several hours.  Daughter states the patient started having diarrhea about 2 days ago.  Started having vomiting about a day ago.  Unable to keep any of her oral pain medications down.  She has had a history of narcotic withdrawal in April 2023 after being unable to obtain refills on her chronic prescriptions.  Patient arrived by EMS.  Patient is confused.  Patient was incontinent of urine.  She may have urinated on herself after she fell.  On arrival temp 97.9 heart rate 83 blood pressure 140/85 satting 90% on room air.  After dose of IV Dilaudid, her room air saturations dropped to 84% with patient started on supplemental oxygen.  Labs showed a white count of 23.3, hemoglobin 12.5, platelets of 164 UA was positive for leukocyte esterase and many bacteria.  Sodium 136, BUN of 51, creatinine 2.19, glucose 177  Total CK of 258  CT on pelvis demonstrated no acute process.  CT cervical spine demonstrated no acute cervical spine fractures.  She has severe right neuroforaminal stenosis at C5-6  CT head showed chronic small vessel ischemic disease in the periventricular white matter.  [facility] contacted for admission.

## 2022-03-31 NOTE — ED Provider Notes (Addendum)
[place] EMERGENCY DEPARTMENT AT [facility] Provider Note   CSN: [id number] Arrival date & time: 03/13/2022  1332     History  Chief Complaint  Patient presents with   Altered Mental Status    Karen Castillo is a 82 y.o. female.   Altered Mental Status Patient brought in after fall and confusion.  Reportedly does not know what happened.  For the last 24 hours had some nausea and vomiting.  Found on the floor by her chair today.  States that it was because she had thrown up.  Is on chronic pain medicines.  Patient's daughter is here and states more confused.  Does have bruising to right face.  States her abdomen does hurt.    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   Gallstones    GERD (gastroesophageal reflux disease)    Glaucoma    both eyes   Gout    Hypertension    IBS (irritable bowel syndrome)    Idiopathic peripheral neuropathy 06/11/2015   Infection of lumbar spine (HCC)    Ingrown nail of great toe of right foot 05/04/2017   Insomnia    Intractable low back pain 07/13/2017   Low back pain 08/27/2017   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   Nausea without vomiting    Obesity    Obesity (BMI 30.0-34.9) 02/13/2020   Pain syndrome, chronic 11/29/2015   Paronychia of great toe, right 05/04/2017   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/26/2010   3.6 cm   Postoperative anemia due to acute blood  loss 08/29/2017   Psychophysiological insomnia 04/03/2015   Radiculopathy 05/04/2014   Radiculopathy of lumbar region 11/29/2015   Risk for falls 06/11/2015   Schizophrenia ([place])    Sleep apnea    USES CPAP    Sleep apnea, obstructive 04/03/2015   Urticaria    Vitamin deficiency     Home Medications Prior to Admission medications   Medication Sig Start Date End Date Taking? Authorizing Provider  alendronate (FOSAMAX) 70 MG tablet Take 70 mg by mouth every Thursday. Take with a full glass of water on an empty stomach.    [provider]  allopurinol (ZYLOPRIM) 100 MG tablet Take 100 mg by mouth daily.    [provider]  amLODipine (NORVASC) 10 MG tablet Take 10 mg by mouth daily. 05/04/19   [provider]  aspirin EC 81 MG tablet Take 81 mg by mouth daily.    [provider]  Biotin 10 MG TABS Take 10 mg by mouth daily.    [provider]  calcium carbonate (OSCAL) 1500 (600 Ca) MG TABS tablet Take 600 mg of elemental calcium by mouth 3 (three) times daily with meals.    [provider]  Cholecalciferol (VITAMIN D3)  2000 units TABS Take 1 tablet by mouth daily.    [provider]  cloNIDine (CATAPRES) 0.1 MG tablet Take 0.1 mg by mouth at bedtime.    [provider]  D-Mannose 500 MG CAPS Take 500 mg by mouth daily.    [provider]  furosemide (LASIX) 20 MG tablet Take 1 tablet (20 mg total) by mouth once a week. Take Furosemide daily if weight 213 lbs. or greater. 03/21/22   Richardo Priest, MD  HYDROmorphone (DILAUDID) 4 MG tablet Take 1 tablet (4 mg total) by mouth every 6 (six) hours as needed for severe pain or moderate pain. Four times a day per patient report 07/04/21   Lorin Glass, PA-C  lansoprazole (PREVACID) 30 MG capsule Take 1 capsule (30 mg total) by mouth daily before breakfast. 30 minutes before breakfast 01/17/19   Aline August, MD  latanoprost (XALATAN) 0.005 % ophthalmic solution Place  1 drop into both eyes at bedtime.    [provider]  linaclotide Rolan Lipa) 290 MCG CAPS capsule Take 1 capsule (290 mcg total) by mouth daily before breakfast. 11/27/17   Jackquline Denmark, MD  Magnesium 250 MG TABS Take 250 mg by mouth daily.    [provider]  methocarbamol (ROBAXIN) 750 MG tablet Take 1 tablet (750 mg total) by mouth 3 (three) times daily. 07/04/21   Lorin Glass, PA-C  nadolol (CORGARD) 40 MG tablet Take 60 mg by mouth daily.    [provider]  naproxen (NAPROSYN) 500 MG tablet Take 500 mg by mouth 2 (two) times daily. 03/07/22   [provider]  NARCAN 4 MG/0.1ML LIQD nasal spray kit Place 4 mg into the nose. I spray in both nares PRN 07/08/19   [provider]  nitroGLYCERIN (NITROSTAT) 0.4 MG SL tablet Place 1 tablet (0.4 mg total) under the tongue every 5 (five) minutes as needed for chest pain. 01/17/19   Aline August, MD  Omega-3 Fatty Acids (FISH OIL) 600 MG CAPS Take 600 mg by mouth 2 (two) times daily.    [provider]  oxybutynin (DITROPAN) 5 MG tablet Take 5 mg by mouth 2 (two) times daily.    [provider]  polyethylene glycol (MIRALAX / GLYCOLAX) 17 g packet Take 17 g by mouth daily as needed for mild constipation. Patient taking differently: Take 17 g by mouth every other day. 01/17/19   Aline August, MD  ranolazine (RANEXA) 500 MG 12 hr tablet Take 1 tablet (500 mg total) by mouth 2 (two) times daily. 08/19/21   Tobb, Kardie, DO  rosuvastatin (CRESTOR) 20 MG tablet Take 20 mg by mouth at bedtime. 04/16/21   [provider]  spironolactone-hydrochlorothiazide (ALDACTAZIDE) 25-25 MG tablet Take 1 tablet by mouth daily. 01/17/21   [provider]  timolol (BETIMOL) 0.5 % ophthalmic solution Place 1 drop into both eyes daily. In the morning    [provider]  trazodone (DESYREL) 300 MG tablet Take 300 mg by mouth at bedtime. 07/10/19   [provider]  valsartan  (DIOVAN) 80 MG tablet Take 80 mg by mouth daily.    [provider]  Wheat Dextrin (BENEFIBER) POWD Take 1 packet by mouth daily. Clear and Sugar FREE. 1 tablespoon daily    [provider]      Allergies    Ciprofloxacin, Cyclobenzaprine, Pregabalin, Fluvastatin, Meloxicam, Metaxalone, Pramipexole, Pravastatin, Sertraline, Macrobid [nitrofurantoin], Amitriptyline, Arthrotec [diclofenac-misoprostol], Augmentin [amoxicillin-pot clavulanate], Bromfenac, Cephalexin, Conj estrog-medroxyprogest ace, Elemental sulfur, Erythromycin, Esomeprazole  magnesium, Levofloxacin, Methadone, Metoclopramide, Other, Oxycodone, Pravachol [pravastatin sodium], Rofecoxib, Tramadol, and Zoloft [sertraline hcl]    Review of Systems   Review of Systems  Physical Exam Updated Vital Signs BP (!) 144/108   Pulse 100   Temp 97.9 F (36.6 C)   Resp (!) 26   SpO2 93%  Physical Exam Vitals and nursing note reviewed.  Eyes:     Pupils: Pupils are equal, round, and reactive to light.  Cardiovascular:     Rate and Rhythm: Regular rhythm.  Pulmonary:     Breath sounds: No wheezing or rhonchi.  Abdominal:     Tenderness: There is abdominal tenderness.     Comments: Left side abdominal tenderness.  No rebound or guarding.  No hernia palpated.  Skin:    Capillary Refill: Capillary refill takes less than 2 seconds.  Neurological:     Mental Status: She is alert.     Comments: Awake and pleasant but mild confusion.     ED Results / Procedures / Treatments   Labs (all labs ordered are listed, but only abnormal results are displayed) Labs Reviewed  CBC WITH DIFFERENTIAL/PLATELET - Abnormal; Notable for the following components:      Result Value   WBC 23.3 (*)    All other components within normal limits  COMPREHENSIVE METABOLIC PANEL - Abnormal; Notable for the following components:   Chloride 97 (*)    Glucose, Bld 177 (*)    BUN 51 (*)    Creatinine, Ser 2.19 (*)    Calcium 8.7 (*)     Albumin 3.1 (*)    GFR, Estimated 22 (*)    All other components within normal limits  URINALYSIS, ROUTINE W REFLEX MICROSCOPIC - Abnormal; Notable for the following components:   APPearance CLOUDY (*)    Hgb urine dipstick MODERATE (*)    Protein, ur 100 (*)    Leukocytes,Ua SMALL (*)    Bacteria, UA MANY (*)    All other components within normal limits  CK - Abnormal; Notable for the following components:   Total CK 258 (*)    All other components within normal limits  URINE CULTURE  RESP PANEL BY RT-PCR (RSV, FLU A&B, COVID)  RVPGX2  CULTURE, BLOOD (ROUTINE X 2)  CULTURE, BLOOD (ROUTINE X 2)  AMMONIA  LACTIC ACID, PLASMA  LACTIC ACID, PLASMA    EKG None  Radiology CT ABDOMEN PELVIS WO CONTRAST  Result Date: 03/12/2022 CLINICAL DATA:  Nausea and vomiting for the past 24 hours. EXAM: CT ABDOMEN AND PELVIS WITHOUT CONTRAST TECHNIQUE: Multidetector CT imaging of the abdomen and pelvis was performed following the standard protocol without IV contrast. RADIATION DOSE REDUCTION: This exam was performed according to the departmental dose-optimization program which includes automated exposure control, adjustment of the mA and/or kV according to patient size and/or use of iterative reconstruction technique. COMPARISON:  CT abdomen pelvis dated Jul 21, 2019. FINDINGS: Lower chest: Cardiomegaly. Unchanged 3.6 cm cyst at the right cardiophrenic angle. Mild scarring at the lung bases. No acute abnormality. Hepatobiliary: No focal liver abnormality is seen. Status post cholecystectomy. No biliary dilatation. Pancreas: Atrophic. No ductal dilatation or surrounding inflammatory changes. Spleen: Normal in size without focal abnormality. Adrenals/Urinary Tract: Adrenal glands are unremarkable. No renal mass, calculus, or hydronephrosis. The bladder is unremarkable. Stomach/Bowel: Stomach is within normal limits. Appendix appears normal. No evidence of bowel wall thickening, distention, or inflammatory  changes. Small stool ball in the rectum. Vascular/Lymphatic: Aortic atherosclerosis. No enlarged abdominal or pelvic  lymph nodes. Reproductive: Uterus and bilateral adnexa are unremarkable. Other: No free fluid or pneumoperitoneum. Musculoskeletal: No acute or significant osseous findings. Chronic T12, L1, and L2 compression deformities again noted. Prior thoracolumbar fusion. IMPRESSION: 1. No acute intra-abdominal process. 2. Small stool ball in the rectum. Correlate for fecal impaction. 3.  Aortic Atherosclerosis (ICD10-I70.0). Electronically Signed   By: Titus Dubin M.D.   On: 04/06/2022 18:36   DG Chest Portable 1 View  Result Date: 03/19/2022 CLINICAL DATA:  Weakness EXAM: PORTABLE CHEST 1 VIEW COMPARISON:  Two-view x-ray 07/04/2021 and older FINDINGS: Fixation hardware along the thoracolumbar spine. Calcified aorta. Enlarged cardiopericardial silhouette. No consolidation or pneumothorax. Mild linear opacity left lung base likely scar or atelectasis. Interstitial changes are seen, possibly chronic. Film is rotated to the left. Overlapping cardiac leads. IMPRESSION: Mild left basilar scar atelectasis. Enlarged heart with some interstitial changes. Acute versus chronic process but could be chronic. Recommend follow-up Electronically Signed   By: Jill Side M.D.   On: 03/11/2022 17:59   CT Cervical Spine Wo Contrast  Result Date: 04/04/2022 CLINICAL DATA:  Neck trauma EXAM: CT CERVICAL SPINE WITHOUT CONTRAST TECHNIQUE: Multidetector CT imaging of the cervical spine was performed without intravenous contrast. Multiplanar CT image reconstructions were also generated. RADIATION DOSE REDUCTION: This exam was performed according to the departmental dose-optimization program which includes automated exposure control, adjustment of the mA and/or kV according to patient size and/or use of iterative reconstruction technique. COMPARISON:  None Available. FINDINGS: Alignment: No listhesis. Skull base and  vertebrae: No acute fracture or suspicious osseous lesion. Osseous fusion across the left facets of C2 and C3, likely degenerative. Soft tissues and spinal canal: No prevertebral fluid or swelling. No visible canal hematoma. Disc levels: Mild degenerative changes throughout the cervical spine, with mild spinal canal stenosis at C6-C7. Multilevel uncovertebral and facet arthropathy, with severe right neural foraminal narrowing at C5-C6. Upper chest: No focal pulmonary opacity or pleural effusion. Other: None. IMPRESSION: No acute cervical spine fracture or traumatic listhesis. Electronically Signed   By: Merilyn Baba M.D.   On: 03/21/2022 14:57   CT Head Wo Contrast  Result Date: 03/11/2022 CLINICAL DATA:  Mental status change, unknown cause EXAM: CT HEAD WITHOUT CONTRAST TECHNIQUE: Contiguous axial images were obtained from the base of the skull through the vertex without intravenous contrast. RADIATION DOSE REDUCTION: This exam was performed according to the departmental dose-optimization program which includes automated exposure control, adjustment of the mA and/or kV according to patient size and/or use of iterative reconstruction technique. COMPARISON:  None Available. FINDINGS: Brain: There is periventricular white matter decreased attenuation consistent with small vessel ischemic changes. Gray-white differentiation is preserved. No acute intracranial hemorrhage, mass effect or shift. No hydrocephalus. Vascular: No hyperdense vessel or unexpected calcification. Skull: Normal. Negative for fracture or focal lesion. Sinuses/Orbits: No acute finding. IMPRESSION: Periventricular white matter changes consistent with chronic small vessel ischemia. No acute intracranial process identified. Electronically Signed   By: Sammie Bench M.D.   On: 03/30/2022 14:51    Procedures Procedures    Medications Ordered in ED Medications  cefTRIAXone (ROCEPHIN) 2 g in sodium chloride 0.9 % 100 mL IVPB (has no  administration in time range)  ondansetron (ZOFRAN) injection 4 mg (4 mg Intravenous Given 03/19/2022 1550)  sodium chloride 0.9 % bolus 500 mL (0 mLs Intravenous Stopped 04/09/2022 1755)  sodium chloride 0.9 % bolus 1,000 mL (1,000 mLs Intravenous Bolus 03/28/2022 1657)  prochlorperazine (COMPAZINE) injection 5 mg (5 mg Intravenous Given 03/19/2022  1857)  HYDROmorphone (DILAUDID) injection 1 mg (1 mg Intravenous Given 03/26/2022 2018)    ED Course/ Medical Decision Making/ A&P                             Medical Decision Making Amount and/or Complexity of Data Reviewed Labs: ordered. Radiology: ordered.  Risk Prescription drug management.   Patient with confusion.  Nausea and vomiting.  Reportedly had a fall.  Has having abdominal pain.  Differential diagnoses long.  Includes infection, trauma, abdominal infection.  White count shows a leukocytosis.  Will get CMP and urinalysis.  Will also get CT scan of the abdomen pelvis.  Has not been coughing.  There is acute kidney injury on the CMP.  Somewhat delay in getting the urine back.  However urinalysis does show UTI.  Has not had a MAP below 65.  However does have some mild hypoxia.  Fluid boluses given.  Has had CHF however.  Will now add lactate and blood cultures.  I think the elevated creatinine is likely secondary to the nausea and vomiting and decreased oral intake.  Also given some pain medicine since she is on 2 mg of Dilaudid 4 times a day orally.  States she has been throwing that up since Friday.  Will admit to hospitalist. Reviewed notes and has had Rocephin in the past and tolerated well.   CRITICAL CARE Performed by: Davonna Belling Total critical care time: 30 minutes Critical care time was exclusive of separately billable procedures and treating other patients. Critical care was necessary to treat or prevent imminent or life-threatening deterioration. Critical care was time spent personally by me on the following activities:  development of treatment plan with patient and/or surrogate as well as nursing, discussions with consultants, evaluation of patient's response to treatment, examination of patient, obtaining history from patient or surrogate, ordering and performing treatments and interventions, ordering and review of laboratory studies, ordering and review of radiographic studies, pulse oximetry and re-evaluation of patient's condition.      Final Clinical Impression(s) / ED Diagnoses Final diagnoses:  Urinary tract infection without hematuria, site unspecified  AKI (acute kidney injury) ([place])  Dehydration    Rx / DC Orders ED Discharge Orders     None         Davonna Belling, MD 03/25/2022 2028    Davonna Belling, MD 04/07/2022 2028

## 2022-04-01 ENCOUNTER — Inpatient Hospital Stay (HOSPITAL_COMMUNITY): Payer: PPO

## 2022-04-01 ENCOUNTER — Inpatient Hospital Stay: Payer: Self-pay

## 2022-04-01 DIAGNOSIS — I63542 Cerebral infarction due to unspecified occlusion or stenosis of left cerebellar artery: Secondary | ICD-10-CM | POA: Diagnosis not present

## 2022-04-01 DIAGNOSIS — Z833 Family history of diabetes mellitus: Secondary | ICD-10-CM | POA: Diagnosis not present

## 2022-04-01 DIAGNOSIS — Z515 Encounter for palliative care: Secondary | ICD-10-CM | POA: Diagnosis not present

## 2022-04-01 DIAGNOSIS — Y92008 Other place in unspecified non-institutional (private) residence as the place of occurrence of the external cause: Secondary | ICD-10-CM | POA: Diagnosis not present

## 2022-04-01 DIAGNOSIS — E7800 Pure hypercholesterolemia, unspecified: Secondary | ICD-10-CM | POA: Diagnosis present

## 2022-04-01 DIAGNOSIS — I10 Essential (primary) hypertension: Secondary | ICD-10-CM | POA: Diagnosis present

## 2022-04-01 DIAGNOSIS — R6520 Severe sepsis without septic shock: Secondary | ICD-10-CM | POA: Diagnosis present

## 2022-04-01 DIAGNOSIS — Z66 Do not resuscitate: Secondary | ICD-10-CM | POA: Diagnosis present

## 2022-04-01 DIAGNOSIS — K219 Gastro-esophageal reflux disease without esophagitis: Secondary | ICD-10-CM | POA: Diagnosis present

## 2022-04-01 DIAGNOSIS — I63411 Cerebral infarction due to embolism of right middle cerebral artery: Secondary | ICD-10-CM

## 2022-04-01 DIAGNOSIS — E860 Dehydration: Secondary | ICD-10-CM | POA: Diagnosis present

## 2022-04-01 DIAGNOSIS — N179 Acute kidney failure, unspecified: Secondary | ICD-10-CM

## 2022-04-01 DIAGNOSIS — G9341 Metabolic encephalopathy: Secondary | ICD-10-CM | POA: Diagnosis present

## 2022-04-01 DIAGNOSIS — W1830XA Fall on same level, unspecified, initial encounter: Secondary | ICD-10-CM | POA: Diagnosis present

## 2022-04-01 DIAGNOSIS — G894 Chronic pain syndrome: Secondary | ICD-10-CM | POA: Diagnosis present

## 2022-04-01 DIAGNOSIS — L89326 Pressure-induced deep tissue damage of left buttock: Secondary | ICD-10-CM | POA: Diagnosis present

## 2022-04-01 DIAGNOSIS — I2510 Atherosclerotic heart disease of native coronary artery without angina pectoris: Secondary | ICD-10-CM | POA: Diagnosis present

## 2022-04-01 DIAGNOSIS — I63311 Cerebral infarction due to thrombosis of right middle cerebral artery: Secondary | ICD-10-CM | POA: Diagnosis not present

## 2022-04-01 DIAGNOSIS — N3000 Acute cystitis without hematuria: Secondary | ICD-10-CM | POA: Diagnosis present

## 2022-04-01 DIAGNOSIS — R627 Adult failure to thrive: Secondary | ICD-10-CM | POA: Diagnosis present

## 2022-04-01 DIAGNOSIS — Z1152 Encounter for screening for COVID-19: Secondary | ICD-10-CM | POA: Diagnosis not present

## 2022-04-01 DIAGNOSIS — F209 Schizophrenia, unspecified: Secondary | ICD-10-CM | POA: Diagnosis present

## 2022-04-01 DIAGNOSIS — R4182 Altered mental status, unspecified: Secondary | ICD-10-CM | POA: Diagnosis present

## 2022-04-01 DIAGNOSIS — A4102 Sepsis due to Methicillin resistant Staphylococcus aureus: Secondary | ICD-10-CM | POA: Diagnosis present

## 2022-04-01 DIAGNOSIS — G8194 Hemiplegia, unspecified affecting left nondominant side: Secondary | ICD-10-CM | POA: Diagnosis not present

## 2022-04-01 DIAGNOSIS — E669 Obesity, unspecified: Secondary | ICD-10-CM | POA: Diagnosis present

## 2022-04-01 DIAGNOSIS — I63511 Cerebral infarction due to unspecified occlusion or stenosis of right middle cerebral artery: Secondary | ICD-10-CM

## 2022-04-01 DIAGNOSIS — F1120 Opioid dependence, uncomplicated: Secondary | ICD-10-CM | POA: Diagnosis present

## 2022-04-01 DIAGNOSIS — G936 Cerebral edema: Secondary | ICD-10-CM | POA: Diagnosis not present

## 2022-04-01 LAB — BLOOD CULTURE ID PANEL (REFLEXED) - BCID2

## 2022-04-01 LAB — CBC WITH DIFFERENTIAL/PLATELET
Abs Immature Granulocytes: 0.29 10*3/uL — ABNORMAL HIGH (ref 0.00–0.07)
Basophils Absolute: 0.1 10*3/uL (ref 0.0–0.1)
Basophils Relative: 1 %
Eosinophils Absolute: 0.2 10*3/uL (ref 0.0–0.5)
Eosinophils Relative: 1 %
HCT: 32.6 % — ABNORMAL LOW (ref 36.0–46.0)
Hemoglobin: 10.7 g/dL — ABNORMAL LOW (ref 12.0–15.0)
Immature Granulocytes: 1 %
Lymphocytes Relative: 3 %
Lymphs Abs: 0.8 10*3/uL (ref 0.7–4.0)
MCH: 29.7 pg (ref 26.0–34.0)
MCHC: 32.8 g/dL (ref 30.0–36.0)
MCV: 90.6 fL (ref 80.0–100.0)
Monocytes Absolute: 1.1 10*3/uL — ABNORMAL HIGH (ref 0.1–1.0)
Monocytes Relative: 5 %
Neutro Abs: 22.2 10*3/uL — ABNORMAL HIGH (ref 1.7–7.7)
Neutrophils Relative %: 89 %
Platelets: 100 10*3/uL — ABNORMAL LOW (ref 150–400)
RBC: 3.6 MIL/uL — ABNORMAL LOW (ref 3.87–5.11)
RDW: 14.1 % (ref 11.5–15.5)
WBC Morphology: INCREASED
WBC: 24.7 10*3/uL — ABNORMAL HIGH (ref 4.0–10.5)
nRBC: 0 % (ref 0.0–0.2)

## 2022-04-01 LAB — COMPREHENSIVE METABOLIC PANEL
ALT: 17 U/L (ref 0–44)
AST: 36 U/L (ref 15–41)
Albumin: 2.4 g/dL — ABNORMAL LOW (ref 3.5–5.0)
Alkaline Phosphatase: 58 U/L (ref 38–126)
Anion gap: 12 (ref 5–15)
BUN: 59 mg/dL — ABNORMAL HIGH (ref 8–23)
CO2: 24 mmol/L (ref 22–32)
Calcium: 8.2 mg/dL — ABNORMAL LOW (ref 8.9–10.3)
Chloride: 101 mmol/L (ref 98–111)
Creatinine, Ser: 2.12 mg/dL — ABNORMAL HIGH (ref 0.44–1.00)
GFR, Estimated: 23 mL/min — ABNORMAL LOW (ref >60)
Glucose, Bld: 131 mg/dL — ABNORMAL HIGH (ref 70–99)
Potassium: 3.9 mmol/L (ref 3.5–5.1)
Sodium: 137 mmol/L (ref 135–145)
Total Bilirubin: 0.7 mg/dL (ref 0.3–1.2)
Total Protein: 6.3 g/dL — ABNORMAL LOW (ref 6.5–8.1)

## 2022-04-01 LAB — RESP PANEL BY RT-PCR (RSV, FLU A&B, COVID)  RVPGX2
Influenza A by PCR: NEGATIVE
Influenza B by PCR: NEGATIVE
Resp Syncytial Virus by PCR: NEGATIVE
SARS Coronavirus 2 by RT PCR: NEGATIVE

## 2022-04-01 LAB — HEMOGLOBIN A1C
Hgb A1c MFr Bld: 5.9 % — ABNORMAL HIGH (ref 4.8–5.6)
Mean Plasma Glucose: 122.63 mg/dL

## 2022-04-01 LAB — CK: Total CK: 344 U/L — ABNORMAL HIGH (ref 38–234)

## 2022-04-01 LAB — SODIUM: Sodium: 140 mmol/L (ref 135–145)

## 2022-04-01 LAB — MRSA NEXT GEN BY PCR, NASAL: MRSA by PCR Next Gen: DETECTED — AB

## 2022-04-01 LAB — GLUCOSE, CAPILLARY: Glucose-Capillary: 147 mg/dL — ABNORMAL HIGH (ref 70–99)

## 2022-04-01 LAB — LACTIC ACID, PLASMA: Lactic Acid, Venous: 2.1 mmol/L (ref 0.5–1.9)

## 2022-04-01 MED ORDER — LACTATED RINGERS IV SOLN: INTRAVENOUS | Status: DC

## 2022-04-01 MED ORDER — GLYCOPYRROLATE 0.2 MG/ML IJ SOLN: 0.2000 mg | INTRAMUSCULAR | Status: DC | PRN

## 2022-04-01 MED ORDER — SODIUM CHLORIDE (PF) 0.9 % IJ SOLN
INTRAMUSCULAR | Status: AC
  Administered 2022-04-01: 10 mL
  Filled 2022-04-01: qty 50

## 2022-04-01 MED ORDER — LACTATED RINGERS IV BOLUS
1000.0000 mL | Freq: Once | INTRAVENOUS | Status: AC
  Administered 2022-04-01: 1000 mL via INTRAVENOUS

## 2022-04-01 MED ORDER — MORPHINE 100MG IN NS 100ML (1MG/ML) PREMIX INFUSION
0.0000 mg/h | INTRAVENOUS | Status: DC
  Administered 2022-04-01: 20 mg/h via INTRAVENOUS
  Administered 2022-04-01: 5 mg/h via INTRAVENOUS
  Administered 2022-04-02 (×2): 20 mg/h via INTRAVENOUS
  Filled 2022-04-01 (×4): qty 100

## 2022-04-01 MED ORDER — GLYCOPYRROLATE 1 MG PO TABS: 1.0000 mg | ORAL_TABLET | ORAL | Status: DC | PRN

## 2022-04-01 MED ORDER — STROKE: EARLY STAGES OF RECOVERY BOOK
Freq: Once | Status: AC
  Filled 2022-04-01: qty 1

## 2022-04-01 MED ORDER — VANCOMYCIN HCL 2000 MG/400ML IV SOLN
2000.0000 mg | INTRAVENOUS | Status: DC
  Filled 2022-04-01: qty 400

## 2022-04-01 MED ORDER — SODIUM CHLORIDE 3 % IV SOLN
INTRAVENOUS | Status: DC
  Filled 2022-04-01 (×3): qty 500

## 2022-04-01 MED ORDER — VANCOMYCIN HCL IN DEXTROSE 1-5 GM/200ML-% IV SOLN: 1000.0000 mg | INTRAVENOUS | Status: DC

## 2022-04-01 MED ORDER — MORPHINE BOLUS VIA INFUSION: 5.0000 mg | INTRAVENOUS | Status: DC | PRN

## 2022-04-01 MED ORDER — DILTIAZEM HCL-DEXTROSE 125-5 MG/125ML-% IV SOLN (PREMIX)
INTRAVENOUS | Status: AC
  Administered 2022-04-01: 5 mg/h via INTRAVENOUS
  Filled 2022-04-01: qty 125

## 2022-04-01 MED ORDER — LORAZEPAM 2 MG/ML IJ SOLN: 2.0000 mg | INTRAMUSCULAR | Status: DC | PRN

## 2022-04-01 MED ORDER — IOHEXOL 350 MG/ML SOLN
50.0000 mL | Freq: Once | INTRAVENOUS | Status: AC | PRN
  Administered 2022-04-01: 100 mL via INTRAVENOUS

## 2022-04-01 MED ORDER — POLYVINYL ALCOHOL 1.4 % OP SOLN: 1.0000 [drp] | Freq: Four times a day (QID) | OPHTHALMIC | Status: DC | PRN

## 2022-04-01 MED ORDER — CHLORHEXIDINE GLUCONATE CLOTH 2 % EX PADS
6.0000 | MEDICATED_PAD | Freq: Every day | CUTANEOUS | Status: DC
  Administered 2022-04-01: 6 via TOPICAL

## 2022-04-01 MED ORDER — ACETAMINOPHEN 650 MG RE SUPP: 650.0000 mg | Freq: Four times a day (QID) | RECTAL | Status: DC | PRN

## 2022-04-01 MED ORDER — DILTIAZEM HCL-DEXTROSE 125-5 MG/125ML-% IV SOLN (PREMIX): 5.0000 mg/h | INTRAVENOUS | Status: DC

## 2022-04-01 MED ORDER — ACETAMINOPHEN 325 MG PO TABS: 650.0000 mg | ORAL_TABLET | Freq: Four times a day (QID) | ORAL | Status: DC | PRN

## 2022-04-01 NOTE — Consult Note (Addendum)
[facility] Telemedicine Consult   Requesting Provider: [place] Participants: Nursing, patient Location of the provider: [facility] Location of the patient: [facility]  This consult was provided via telemedicine with 2-way video and audio communication. The patient/family was informed that care would be provided in this way and agreed to receive care in this manner.    Chief Complaint: left sided weakness  HPI: 82 yo F who presents with left sided weakness after being admitted with confusion following a fall.  Head CT was done yesterday which was negative.  Blood cultures were positive for MRSA bacteremia and her altered mental status was initially attributed to this.  This morning, however, she was still able to move her left side fairly well around 945, but subsequently worsened and it was noted that she was plegic on the left side and a code stroke was activated.    LKW: unclear, N/V for 24 hours with confusion since prior to admission tnk given?: No, out of window IR Thrombectomy? No, completed infarct Time of teleneurologist evaluation: 12:28  Exam: Vitals:   04/01/22 0555 04/01/22 0840  BP: (!) 133/48 121/81  Pulse: 88 83  Resp: 20 18  Temp: 98.3 F (36.8 C) 98.2 F (36.8 C)  SpO2: 94% 95%    General: She is in bed, in her hospital gown, appears somnolent.  1A: Level of Consciousness - 1 1B: Ask Month and Age - 1 1C: 'Blink Eyes' & 'Squeeze Hands' - 0 2: Test Horizontal Extraocular Movements - 2 3: Test Visual Fields - 0 4: Test Facial Palsy - 2 5A: Test Left Arm Motor Drift - 3 5B: Test Right Arm Motor Drift - 2 6A: Test Left Leg Motor Drift - 4 6B: Test Right Leg Motor Drift - 0 7: Test Limb Ataxia - 0 8: Test Sensation - 2 9: Test Language/Aphasia- 1 10: Test Dysarthria - 1 11: Test Extinction/Inattention - 2 NIHSS score: 21  speech is fragmentary, but she is able to answer questions and follow commands.  Imaging Reviewed: CT head- large MCA  stroke withy cerebral edema, small left frontal stroke and left cerebellar  Labs reviewed in epic and pertinent values follow: Cr 2.12 Bcx + for MRSA, 2/2   Assessment: 82 yo F who presented with fall and confusion and was found to have MRSA bacteremia  with new development of left sided weakness.  Given the fact that she did have symptoms of a fall of unclear etiology and confusion since the fall, I do not think we can consider her last known well this morning.  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  With her bacteremia, septic embolus is a possibility, as would be cardioembolic events as there appear to be multiple infarcts.  This is a very large stroke, and timeframe I feel is slightly unclear, if this did develop overnight and we are already seeing this degree of edema, I would be worried about malignant MCA edema and she may benefit from hypertonic saline.  For this she would need transfer to the ICU, preferably neuro ICU at [facility].  This is likely to be a very devastating stroke.  At her age with this degree of stroke, I think that the best case scenario would be nursing home bound with 24 hours of care.  She may be able to be somewhat interactive, but likely to have significantly more confusion that she had prior to the stroke.  It  is possible given this degree of edema that this could progress, causing worsening requiring intubation or even death. Given her age and comorbidities, unlikely to be a surgical candidate.    Recommendations:  1) primary team to discuss with family regarding goals of care and aggressiveness of care 2) if aggressive care is pursued, I would start 3% hypertonic saline at 75 cc an hour 3) I would start a baby aspirin 81 mg daily 4) MRI brain w/o contrast 5) echo/telemetry 6)stroke team will follow.   This patient is critically ill and at significant risk of neurological  worsening, death and care requires constant monitoring of vital signs, hemodynamics,respiratory and cardiac monitoring, neurological assessment, discussion with family, other specialists and medical decision making of high complexity. I spent 50 minutes of neurocritical care time  in the care of  this patient. This was time spent independent of any time provided by nurse practitioner or PA.  Roland Rack, MD [facility] [phone]  If 7pm- 7am, please page neurology on call as listed in [place]. 04/01/2022  1:25 PM

## 2022-04-01 NOTE — Progress Notes (Signed)
NP Olena Heckle was notified that patient hasn't voided since she's been up here. Bladder scanner showed 254ms.

## 2022-04-01 NOTE — IPAL (Signed)
  Interdisciplinary Goals of Care Family Meeting   Date carried out: 04/01/2022  Location of the meeting: Bedside  Member's involved: Physician, Bedside Registered Nurse, and Family Member or next of kin  Durable Power of Attorney or acting medical decision maker: Daughter    Discussion: We discussed goals of care for [employer] .  Discussed baseline tough QoL due to chronic pain and her desire to not have further deterioration in this quality of life.  Discussed natural course of strokes of this size and that while recovery is possible, there are almost always permanent deficits.  In light of her previously stated desires we will transition to allowing a natural peaceful death rather than try to push through this.  Condolences offered.  In hospital death expected.  Code status:   Code Status: DNR   Disposition: In-patient comfort care  Time spent for the meeting: [place], MD  04/01/2022, 6:13 PM

## 2022-04-01 NOTE — Plan of Care (Signed)
  Interdisciplinary Goals of Care Family Meeting   Date carried out:: 04/01/2022  Location of the meeting: Bedside  Member's involved: Physician and Family Member or next of kin  Durable Power of Attorney or acting medical decision maker:   Pt's daughter Karen Castillo   Discussion: We discussed goals of care for Karen Castillo .    Pt was awake and oriented enough to participate in bedside conversation regarding whether she would want to be intubated.  She clearly states that she does not want life support and would want to be comfortable if her conditions worsens enough to warrant this.  Daughter is at the bedside and agrees with this decision  Code status: Full DNR  Disposition: Continue current acute care   Time spent for the meeting: 10 minutes  Karen Castillo 04/01/2022, 2:51 PM

## 2022-04-01 NOTE — Progress Notes (Signed)
Alerted to room by PT, patient left side is flaccid with facial droop noted, notified Rapid Response RN,

## 2022-04-01 NOTE — Plan of Care (Signed)
  Problem: Ischemic Stroke/TIA Tissue Perfusion: Goal: Complications of ischemic stroke/TIA will be minimized Outcome: Progressing   Problem: Health Behavior/Discharge Planning: Goal: Goals will be collaboratively established with patient/family Outcome: Progressing

## 2022-04-01 NOTE — Progress Notes (Signed)
Rapid response RN was contacted because patient was minimally responsive and very lethargic from the ED. NP Olena Heckle and RR RN arrived to assess patient and determined patient is stable. Cardiac monitoring was ordered.

## 2022-04-01 NOTE — Progress Notes (Addendum)
Elert 1222  Per bedside RN pt was moving her left side at 0945 as she was in the room with pt to fix her IV. Elerted at 1222 for new L sided flaccid, L side neglect, and L facial droop. Per PT note start of PT was at 1211 and bedside RN states that is when symptoms were noticed.   Dr. Leonel Ramsay paged at 73 Dr. Leonel Ramsay on screen at 47 Inpt MD at bedside 1234  Pt to CT 1239 Pt leaving CT 1259

## 2022-04-01 NOTE — Evaluation (Signed)
Physical Therapy Evaluation Patient Details Name: Karen Castillo MRN: [id number] DOB: [birth year] Today's Date: 81/23/2024  History of Present Illness  82 year old female history of chronic pain syndrome on chronic oral Dilaudid by pain clinic, obesity, history of schizophrenia, bowel syndrome, hypertension, presents to the ER 03/16/2022 after being found on the floor. Found with AKI.  Clinical Impression  Pt admitted with above diagnosis.  Pt currently with functional limitations due to the deficits listed below (see PT Problem List). Pt will benefit from skilled PT to increase their independence and safety with mobility to allow discharge to the venue listed below.  Patient noted  with right gaze preference, L U and LE flaccid. Patient answers yes to most questions. RN notified, code Stroke was initiated. PT evaluation  was stopped.          Recommendations for follow up therapy are one component of a multi-disciplinary discharge planning process, led by the attending physician.  Recommendations may be updated based on patient status, additional functional criteria and insurance authorization.  Follow Up Recommendations Skilled nursing-short term rehab (<3 hours/day) Can patient physically be transported by private vehicle: No    Assistance Recommended at Discharge Frequent or constant Supervision/Assistance  Patient can return home with the following  Two people to help with walking and/or transfers;Two people to help with bathing/dressing/bathroom;Assist for transportation    Equipment Recommendations None recommended by PT  Recommendations for Other Services       Functional Status Assessment Patient has had a recent decline in their functional status and demonstrates the ability to make significant improvements in function in a reasonable and predictable amount of time.     Precautions / Restrictions Precautions Precautions: Fall      Mobility  Bed Mobility                General bed mobility comments: deferred due to signs of a new stroke    Transfers                        Ambulation/Gait                  Stairs            Wheelchair Mobility    Modified Rankin (Stroke Patients Only)       Balance                                             Pertinent Vitals/Pain Pain Assessment Pain Assessment:  (withdraws to pain on the left, appreciated pain on right)    Home Living Family/patient expects to be discharged to:: Private residence Living Arrangements: Children Available Help at Discharge: Family;Available PRN/intermittently Type of Home: House Home Access: Stairs to enter   Entrance Stairs-Number of Steps: 1   Home Layout: Able to live on main level with bedroom/bathroom;Two level Home Equipment: Conservation officer, nature (2 wheels);Cane - quad Additional Comments: indiormation from previous encounter    Prior Function Prior Level of Function : Independent/Modified Independent             Mobility Comments: per notes       Hand Dominance        Extremity/Trunk Assessment   Upper Extremity Assessment Upper Extremity Assessment: LUE deficits/detail;RUE deficits/detail RUE Deficits / Details: able to lift arm and squeeze hand, LUE Deficits / Details: flaccid,  withdraws to deep stim, no volitional movement    Lower Extremity Assessment Lower Extremity Assessment: LLE deficits/detail LLE Deficits / Details: flaccid, does not hold when placed in  flexion    Cervical / Trunk Assessment Cervical / Trunk Assessment: Other exceptions Cervical / Trunk Exceptions: left neglect, left facial droop  Communication      Cognition Arousal/Alertness: Lethargic Behavior During Therapy: Flat affect Overall Cognitive Status: Impaired/Different from baseline Area of Impairment: Orientation, Attention, Memory, Following commands, Awareness                   Current Attention Level:  Focused   Following Commands: Follows one step commands inconsistently   Awareness: Intellectual   General Comments: arouses, stes yes to most questions, initially not oriented to place, but once  stated  was oriented, unable to state date and her BD        General Comments      Exercises     Assessment/Plan    PT Assessment Patient needs continued PT services  PT Problem List Decreased strength;Decreased cognition;Impaired tone;Decreased mobility;Decreased activity tolerance;Impaired sensation       PT Treatment Interventions Functional mobility training;Balance training;Patient/family education;Therapeutic activities;Neuromuscular re-education;Therapeutic exercise    PT Goals (Current goals can be found in the Care Plan section)  Acute Rehab PT Goals PT Goal Formulation: Patient unable to participate in goal setting Time For Goal Achievement: 04/15/22 Potential to Achieve Goals: Fair    Frequency Min 2X/week     Co-evaluation               AM-PAC PT "6 Clicks" Mobility  Outcome Measure Help needed turning from your back to your side while in a flat bed without using bedrails?: Total Help needed moving from lying on your back to sitting on the side of a flat bed without using bedrails?: Total Help needed moving to and from a bed to a chair (including a wheelchair)?: Total Help needed standing up from a chair using your arms (e.g., wheelchair or bedside chair)?: Total Help needed to walk in hospital room?: Total Help needed climbing 3-5 steps with a railing? : Total 6 Click Score: 6    End of Session   Activity Tolerance: Treatment limited secondary to medical complications (Comment) (found with Left side falccid, left facial droop) Patient left: in bed;with nursing/sitter in room Nurse Communication: Mobility status (left  side flaccid) PT Visit Diagnosis: Other symptoms and signs involving the nervous system (R29.898);History of falling (Z91.81)    Time:  0981-1914 PT Time Calculation (min) (ACUTE ONLY): 24 min   Charges:   PT Evaluation $PT Eval Low Complexity: 1 Low PT Treatments $Therapeutic Activity: 8-22 mins      Tresa Endo PT Acute Rehabilitation Services Office [phone] Weekend [phone]   Claretha Cooper 04/01/2022, 1:05 PM

## 2022-04-01 NOTE — Progress Notes (Addendum)
82 year old female with a history of chronic pain syndrome on oral Dilaudid by pain clinic, obesity, history of schizophrenia, hypertension was brought to the ED after being found on the floor.  Patient lives with her daughter.  She was found on the ground for several hours. CT head obtained yesterday was unremarkable, CT cervical spine showed no acute cervical spine fractures.  She has severe right neural foraminal stenosis at C5 and C6.  CT pelvis showed no acute process.  Total CK was 258.  Patient was admitted with acute kidney injury and started on IV fluids.  Also started on IV Rocephin for acute cystitis.  This morning patient was lethargic but was able to move her left arm as per nursing staff.  When PT saw her she was found to have flaccid left upper and lower extremity.  Code stroke was initiated.  CT head showed large right MCA territory infarct with possible petechial hemorrhage. Telemedicine consult was obtained with neurology, recommended transfer to ICU with 3% normal saline for cerebral edema.  Called and discussed with critical care, patient will be transferred to ICU at [facility] long and started on 3% normal saline.  Patient will be transferred to critical care service.  Discussed with patient's daughter at bedside.  She would like to continue with aggressive measures at this time.  If patient does not improve over the next few days, consider palliative care consultation.

## 2022-04-01 NOTE — Consult Note (Signed)
NAME:  Karen Castillo, MRN:  [id number], DOB:  Feb 04, 1941, LOS: 0 ADMISSION DATE:  03/12/2022, CONSULTATION DATE:  04/01/22 REFERRING MD:  Darrick Meigs, CHIEF COMPLAINT:  Acute stroke   History of Present Illness:  Karen Castillo is a 82 y.o. F with PMH significant for Schizophrenia, short bowel syndrome, obesity, chronic narcotic use, who presented after being found down at home after a fall.  Her daughter reported nausea and vomiting for the day prior and was unable to keep down her pain medication. She was confused on admission and an initial head CT was negative.  She was admitted to [facility] with an AKI and UTI.    On 1/23 pt was noted to be flaccid on the L side with facial droop around 1222 and code stroke was called.   The repeat CT head showed large R MCA territory infarct with possible petechial hemorrhage and hypodensity in the L cerebellum.   Neurology was consulted and felt that her symptoms on admission were possibly related to the stroke, so was felt out of the window for TPA.  Her blood cultures were positive for MRSA bacteremia increasing the likelihood of septic embolus.  Neurology recommended 3% hypertonic and MRI with transfer to [facility] neuro ICU from [facility].   Her daughter states that pt lives with her but is independently able to take care of herself at baseline and still drives.  No prior stroke history.   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), Gallstones, GERD (gastroesophageal reflux disease),  Glaucoma, Gout, Hypertension, IBS (irritable bowel syndrome), Idiopathic peripheral neuropathy (06/11/2015), Infection of lumbar spine (HCC), Ingrown nail of great toe of right foot (05/04/2017), Insomnia, Intractable low back pain (07/13/2017), Low back pain (08/27/2017), Malignant neoplasm of left breast ([place]) (04/24/2011), Nausea without vomiting, Obesity, Obesity (BMI 30.0-34.9) (02/13/2020), Pain syndrome, chronic (11/29/2015), Paronychia of great toe, right (05/04/2017), 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   Significant Hospital Events: Including procedures, antibiotic start and stop dates in addition to other pertinent events   1/23 code stroke, large MCA CVA, PCCM consult   Interim History / Subjective:  Pt arrived to ICU flaccid on L, but hemodynamically stable and protecting her airway  Objective   Blood pressure 121/81, pulse 83, temperature 98.2 F (36.8 C), temperature source Oral, resp. rate 18, SpO2 95 %.        Intake/Output Summary (Last 24 hours) at 04/01/2022 1330 Last data filed at 04/01/2022 0640 Gross per 24 hour  Intake 1360.32 ml  Output 350 ml  Net 1010.32 ml   There were no vitals filed for this visit.  General:  well-nourished F, awake and in no distress HEENT: MM pink/moist, pupils equal, sclera anicteric Neuro: awake, L facial droop, flaccid LUE and LLE, nodding to questions, able to answer with one or two words without dysarthria.  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  Continue full code   MRSA bacteremia 2/2 blood cultures with gram positive cocci -Echo, consider TEE -start vancomycin -monitor for signs of worsening sepsis or shock   AKI Baseline creatinine <1.0, up to 2.19 -likely secondary to pre-renal volume depletion in the setting of vomiting  -additional L LR and maintenance IVF -monitor renal indices, UOP, electrolytes and avoid nephrotoxins   UTI -continue ceftriaxone and follow UC   Best Practice (right click and "Reselect all SmartList Selections" daily)   Diet/type: NPO DVT prophylaxis: SCD GI prophylaxis: N/A Lines: N/A Foley:  N/A Code Status:  full code Last date of multidisciplinary goals of care discussion [Code status discussed with patient's daughter, full code, would not want long term life support]  Labs   CBC: Recent Labs  Lab 04/04/2022 1344 04/01/22 0458  WBC 23.3* 24.7*  NEUTROABS 21.7* 22.2*  HGB 12.5 10.7*  HCT 38.9 32.6*  MCV 91.5 90.6  PLT 164 100*    Basic Metabolic Panel: Recent Labs  Lab 03/17/2022 1344 04/01/22 0458  NA 136 137  K 3.6 3.9  CL 97* 101  CO2 25 24  GLUCOSE 177* 131*  BUN 51* 59*  CREATININE 2.19* 2.12*  CALCIUM 8.7* 8.2*   GFR: Estimated Creatinine Clearance: 24.3 mL/min (A) (by C-G formula based on SCr of 2.12 mg/dL (H)). Recent Labs  Lab 03/23/2022 1344 03/15/2022 2106 03/30/2022 2305 04/01/22 0458  WBC 23.3*  --   --  24.7*   LATICACIDVEN  --  2.4* 2.1*  --     Liver Function Tests: Recent Labs  Lab 03/27/2022 1344 04/01/22 0458  AST 39 36  ALT 17 17  ALKPHOS 73 58  BILITOT 0.7 0.7  PROT 7.6 6.3*  ALBUMIN 3.1* 2.4*   No results for input(s): "LIPASE", "AMYLASE" in the last 168 hours. Recent Labs  Lab 03/17/2022 1345  AMMONIA 19    ABG No results found for: "PHART", "PCO2ART", "PO2ART", "HCO3", "TCO2", "ACIDBASEDEF", "O2SAT"   Coagulation Profile: No results for input(s): "INR", "PROTIME" in the last 168 hours.  Cardiac Enzymes: Recent Labs  Lab 04/04/2022 1344 04/01/22 0458  CKTOTAL 258* 344*    HbA1C: No results found for: "HGBA1C"  CBG: Recent Labs  Lab 04/01/22 1232  GLUCAP 147*    Review of Systems:   Unable to obtain  Past Medical History:  She,  has a past medical history of Abdominal pain, Acute post-operative pain (07/13/2017), Allergic rhinitis, Anemia, Arthritis, Bipolar disorder ([place]), Cancer (HCC), Chronic bilateral low back pain (09/16/2016), Chronic constipation, Chronic pain syndrome, Constipation, Coronary artery disease involving native coronary artery of native heart without angina pectoris (02/13/2020), 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), Dry eyes, Dysuria, Elevated cholesterol, 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), Gallstones, GERD (gastroesophageal reflux disease), Glaucoma, Gout, Hypertension, IBS (irritable bowel syndrome), Idiopathic peripheral neuropathy (06/11/2015), Infection of lumbar spine ([place]),  Ingrown nail of great toe of right foot (05/04/2017), Insomnia, Intractable low back pain (07/13/2017), Low back pain (08/27/2017), 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   Surgical History:   Past Surgical History:  Procedure Laterality Date   ABDOMINAL EXPOSURE N/A 05/04/2014   Procedure: ABDOMINAL EXPOSURE;  Surgeon: Serafina Mitchell, MD;  Location: [facility];  Service: Vascular;  Laterality: N/A;   ANTERIOR LUMBAR FUSION N/A 05/04/2014   Procedure: ANTERIOR LUMBAR FUSION 1 LEVEL;  Surgeon: Sinclair Ship, MD;  Location: [place];  Service: Orthopedics;  Laterality: N/A;  Lumbar 5-sacrum 1 anterior lumbar interbody fusion with instrumentation and allograft   ANTERIOR LUMBAR FUSION  07/2017   ANTERIOR LUMBAR FUSION  08/2017   BACK SURGERY     2010 ,2015LUMB FUSION, 06/2013 [facility] FUSION   BACK SURGERY  07/2017   Lumbar   BREAST BIOPSY  2013   pt said it was positive    BREAST SURGERY     LEFT BRREAST BX   CARPAL TUNNEL RELEASE     1994 RT   CARPAL TUNNEL RELEASE Bilateral    CATARACT EXTRACTION, BILATERAL  2017   CHOLECYSTECTOMY     2004   COLONOSCOPY W/ BIOPSIES     Colonoscopy [id number], 2009, 2011   ERCP     2009   ESOPHAGOGASTRODUODENOSCOPY  08/09/2017   Small hiatal hernia. Mild gastritis. Incidenta; duodenal diverticulum. Incidental gastric polyp.    LEEP     1992 and [place]     X 4    thorocolumbar fusion  06/2013   TUBAL LIGATION       Social History:   reports that she has never smoked. She has never used smokeless tobacco. She reports that she does not drink alcohol and does not use drugs.   Family History:  Her family history includes Diabetes in her mother; Prostate cancer in her father; Stroke in her sister. There is no history of Colon cancer, Esophageal cancer, Stomach cancer, or Rectal cancer.   Allergies Allergies  Allergen Reactions   Ciprofloxacin Swelling    Tongue swells    Cyclobenzaprine Swelling    Feet swell    Pregabalin Anaphylaxis, Shortness Of Breath, Swelling and Other (See Comments)    Dizziness also   Fluvastatin Other (See Comments)    Leg pain    Meloxicam Swelling    Feet became swollen   Metaxalone Rash and Other (See Comments)    Flushing of the skin and sore throat, too   Pramipexole Nausea And Vomiting   Pravastatin Other (See Comments)    Leg pain   Sertraline Other (See Comments)    Sensitive teeth   Macrobid [Nitrofurantoin] Diarrhea and Nausea Only   Amitriptyline Other (See Comments)    Weird feeling all over   Arthrotec [Diclofenac-Misoprostol] Diarrhea   Augmentin [Amoxicillin-Pot Clavulanate] Diarrhea    Has patient had a PCN reaction causing immediate rash, facial/tongue/throat swelling, SOB or lightheadedness with hypotension: No Has patient had a PCN reaction causing severe rash involving mucus membranes or skin necrosis: No Has patient had a PCN reaction that required hospitalization: No Has patient  had a PCN reaction occurring within the last 10 years: No If all of the above answers are "NO", then may proceed with Cephalosporin use.    Bromfenac Diarrhea and Nausea Only   Cephalexin Diarrhea and Nausea Only   Conj Estrog-Medroxyprogest Ace Other (See Comments)    Unsure of reaction type Couldn't tolerate   Elemental Sulfur Diarrhea and Nausea Only   Erythromycin Diarrhea and Nausea Only   Esomeprazole Magnesium Nausea Only   Levofloxacin Other (See Comments) and Nausea Only    Stomach upset   Methadone Diarrhea   Metoclopramide Diarrhea and Nausea Only   Other Other (See Comments)    Darvocet   Oxycodone Hives, Swelling and Rash   Pravachol [Pravastatin Sodium] Other (See Comments)    Upper leg pain   Rofecoxib Diarrhea and Nausea Only   Tramadol Diarrhea   Zoloft [Sertraline Hcl] Other (See Comments)    Sensitivity with teeth     Home Medications  Prior to Admission medications   Medication Sig  Start Date End Date Taking? Authorizing Provider  alendronate (FOSAMAX) 70 MG tablet Take 70 mg by mouth every Thursday. Take with a full glass of water on an empty stomach.    [provider]  allopurinol (ZYLOPRIM) 100 MG tablet Take 100 mg by mouth daily.    [provider]  amLODipine (NORVASC) 10 MG tablet Take 10 mg by mouth daily. 05/04/19   [provider]  aspirin EC 81 MG tablet Take 81 mg by mouth daily.    [provider]  Biotin 10 MG TABS Take 10 mg by mouth daily.    [provider]  calcium carbonate (OSCAL) 1500 (600 Ca) MG TABS tablet Take 600 mg of elemental calcium by mouth 3 (three) times daily with meals.    [provider]  Cholecalciferol (VITAMIN D3) 2000 units TABS Take 1 tablet by mouth daily.    [provider]  cloNIDine (CATAPRES) 0.1 MG tablet Take 0.1 mg by mouth at bedtime.    [provider]  D-Mannose 500 MG CAPS Take 500 mg by mouth daily.    [provider]  furosemide (LASIX) 20 MG tablet Take 1 tablet (20 mg total) by mouth once a week. Take Furosemide daily if weight 213 lbs. or greater. 03/21/22   Richardo Priest, MD  HYDROmorphone (DILAUDID) 4 MG tablet Take 1 tablet (4 mg total) by mouth every 6 (six) hours as needed for severe pain or moderate pain. Four times a day per patient report 07/04/21   Lorin Glass, PA-C  lansoprazole (PREVACID) 30 MG capsule Take 1 capsule (30 mg total) by mouth daily before breakfast. 30 minutes before breakfast 01/17/19   Aline August, MD  latanoprost (XALATAN) 0.005 % ophthalmic solution Place 1 drop into both eyes at bedtime.    [provider]  linaclotide Rolan Lipa) 290 MCG CAPS capsule Take 1 capsule (290 mcg total) by mouth daily before breakfast. 11/27/17   Jackquline Denmark, MD  Magnesium 250 MG TABS Take 250 mg by mouth daily.    [provider]  methocarbamol (ROBAXIN) 750 MG tablet Take 1 tablet (750 mg total) by mouth  3 (three) times daily. 07/04/21   Lorin Glass, PA-C  nadolol (CORGARD) 40 MG tablet Take 60 mg by mouth daily.    [provider]  naproxen (NAPROSYN) 500 MG tablet Take 500 mg by mouth 2 (two) times daily. 03/07/22   [provider]  NARCAN 4 MG/0.1ML LIQD  nasal spray kit Place 4 mg into the nose. I spray in both nares PRN 07/08/19   [provider]  nitroGLYCERIN (NITROSTAT) 0.4 MG SL tablet Place 1 tablet (0.4 mg total) under the tongue every 5 (five) minutes as needed for chest pain. 01/17/19   Aline August, MD  Omega-3 Fatty Acids (FISH OIL) 600 MG CAPS Take 600 mg by mouth 2 (two) times daily.    [provider]  oxybutynin (DITROPAN) 5 MG tablet Take 5 mg by mouth 2 (two) times daily.    [provider]  polyethylene glycol (MIRALAX / GLYCOLAX) 17 g packet Take 17 g by mouth daily as needed for mild constipation. Patient taking differently: Take 17 g by mouth every other day. 01/17/19   Aline August, MD  ranolazine (RANEXA) 500 MG 12 hr tablet Take 1 tablet (500 mg total) by mouth 2 (two) times daily. 08/19/21   Tobb, Kardie, DO  rosuvastatin (CRESTOR) 20 MG tablet Take 20 mg by mouth at bedtime. 04/16/21   [provider]  spironolactone-hydrochlorothiazide (ALDACTAZIDE) 25-25 MG tablet Take 1 tablet by mouth daily. 01/17/21   [provider]  timolol (BETIMOL) 0.5 % ophthalmic solution Place 1 drop into both eyes daily. In the morning    [provider]  trazodone (DESYREL) 300 MG tablet Take 300 mg by mouth at bedtime. 07/10/19   [provider]  valsartan (DIOVAN) 80 MG tablet Take 80 mg by mouth daily.    [provider]  Wheat Dextrin (BENEFIBER) POWD Take 1 packet by mouth daily. Clear and Sugar FREE. 1 tablespoon daily    [provider]     Critical care time: 40 minutes     CRITICAL CARE Performed by: Otilio Carpen Salayah Meares   Total critical care time: 40 minutes  Critical care  time was exclusive of separately billable procedures and treating other patients.  Critical care was necessary to treat or prevent imminent or life-threatening deterioration.  Critical care was time spent personally by me on the following activities: development of treatment plan with patient and/or surrogate as well as nursing, discussions with consultants, evaluation of patient's response to treatment, examination of patient, obtaining history from patient or surrogate, ordering and performing treatments and interventions, ordering and review of laboratory studies, ordering and review of radiographic studies, pulse oximetry and re-evaluation of patient's condition.  Otilio Carpen Makaylynn Bonillas, PA-C [facility] See Amion for pager If no response to pager , please call [phone] until 7pm After 7:00 pm call Elink  [phone]

## 2022-04-01 NOTE — Progress Notes (Signed)
PHARMACY - PHYSICIAN COMMUNICATION CRITICAL VALUE ALERT - BLOOD CULTURE IDENTIFICATION (BCID)  Karen Castillo is an 82 y.o. female who presented to [facility] on 03/21/2022 with a chief complaint of fall  Assessment:  fall, down for several hours. Diarrhea, vomiting  for several days. CT and xrays negative for acute process. Now with 3/4 MRSA in blood cultures  Name of physician (or Provider) Contacted: Lama,G.  Current antibiotics: None  Changes to prescribed antibiotics recommended:  Vanco per pharmacy  ID: MRSA bacteremia, acute cystitis Afebrile, WBC 24.7, Scr 2.12, LA 2.1  Vanco 1/23>>  Vanco 2g IV x 1 Vancomycin 1000 mg IV Q 48 hrs.  Goal AUC 400-550. Expected AUC: 512, SCr used: 2.12, weight 99.8kg Adjust dosing and interval as Scr improves.   Results for orders placed or performed during the hospital encounter of 03/25/2022  Blood Culture ID Panel (Reflexed) (Collected: 04/01/2022  9:09 PM)  Result Value Ref Range   Enterococcus faecalis NOT DETECTED NOT DETECTED   Enterococcus Faecium NOT DETECTED NOT DETECTED   Listeria monocytogenes NOT DETECTED NOT DETECTED   Staphylococcus species DETECTED (A) NOT DETECTED   Staphylococcus aureus (BCID) DETECTED (A) NOT DETECTED   Staphylococcus epidermidis NOT DETECTED NOT DETECTED   Staphylococcus lugdunensis NOT DETECTED NOT DETECTED   Streptococcus species NOT DETECTED NOT DETECTED   Streptococcus agalactiae NOT DETECTED NOT DETECTED   Streptococcus pneumoniae NOT DETECTED NOT DETECTED   Streptococcus pyogenes NOT DETECTED NOT DETECTED   A.calcoaceticus-baumannii NOT DETECTED NOT DETECTED   Bacteroides fragilis NOT DETECTED NOT DETECTED   Enterobacterales NOT DETECTED NOT DETECTED   Enterobacter cloacae complex NOT DETECTED NOT DETECTED   Escherichia coli NOT DETECTED NOT DETECTED   Klebsiella aerogenes NOT DETECTED NOT DETECTED   Klebsiella oxytoca NOT DETECTED NOT DETECTED   Klebsiella pneumoniae NOT DETECTED NOT  DETECTED   Proteus species NOT DETECTED NOT DETECTED   Salmonella species NOT DETECTED NOT DETECTED   Serratia marcescens NOT DETECTED NOT DETECTED   Haemophilus influenzae NOT DETECTED NOT DETECTED   Neisseria meningitidis NOT DETECTED NOT DETECTED   Pseudomonas aeruginosa NOT DETECTED NOT DETECTED   Stenotrophomonas maltophilia NOT DETECTED NOT DETECTED   Candida albicans NOT DETECTED NOT DETECTED   Candida auris NOT DETECTED NOT DETECTED   Candida glabrata NOT DETECTED NOT DETECTED   Candida krusei NOT DETECTED NOT DETECTED   Candida parapsilosis NOT DETECTED NOT DETECTED   Candida tropicalis NOT DETECTED NOT DETECTED   Cryptococcus neoformans/gattii NOT DETECTED NOT DETECTED   Meth resistant mecA/C and MREJ DETECTED (A) NOT DETECTED   Tatiyanna Lashley S. Alford Highland, PharmD, BCPS Clinical Staff Pharmacist [email or web address] [place] 04/01/2022  12:23 PM

## 2022-04-02 DIAGNOSIS — N179 Acute kidney failure, unspecified: Secondary | ICD-10-CM | POA: Diagnosis not present

## 2022-04-03 LAB — URINE CULTURE: Culture: >=100000 — AB

## 2022-04-03 LAB — CULTURE, BLOOD (ROUTINE X 2)
Special Requests: ADEQUATE
Special Requests: ADEQUATE

## 2022-04-10 NOTE — Progress Notes (Signed)
04-13-2022   I have seen and evaluated the patient for end of life care.  S:  No events.  O: Blood pressure (!) 152/80, pulse (!) 117, temperature (!) 100.4 F (38 C), temperature source Axillary, resp. rate 15, height '5\' 4"'$  (1.626 m), weight 98.9 kg, SpO2 97 %.  Agonal respirations No distress MM dry  A:  Multiple large strokes MRSA bacteremia Baseline FTT, chronic pain  P:  - Morphine titrated to comfort - In hospital death expected - Daughter updated at bedside  Erskine Emery MD [place] epic messenger for cross cover needs If after hours, please call E-link

## 2022-04-10 NOTE — Progress Notes (Signed)
Postmortem care complete. Patient tranferred to morgue.

## 2022-04-10 NOTE — Death Summary Note (Addendum)
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  Patient lives with her daughter.  Patient was on the ground for several hours.  Daughter states the patient started having diarrhea about 2 days ago.  Started having vomiting about a day ago.  Unable to keep any of her oral pain medications down.  She has had a history of narcotic withdrawal in April 2023 after being unable to obtain refills on her chronic prescriptions.   Patient arrived by EMS.  Patient is confused.  Patient was incontinent of urine.  She may have urinated on herself after she fell.   On arrival temp 97.9 heart rate 83 blood pressure 140/85 satting 90% on room air.   After dose of IV Dilaudid, her room air saturations dropped to 84% with patient started on supplemental oxygen.   Labs showed a white count of 23.3, hemoglobin 12.5, platelets of 164 UA was positive for leukocyte esterase and many bacteria.   Sodium 136, BUN of 51, creatinine 2.19, glucose 177   Total CK of 258   CT on pelvis  demonstrated no acute process.   CT cervical spine demonstrated no acute cervical spine fractures.  She has severe right neuroforaminal stenosis at C5-6   CT head showed chronic small vessel ischemic disease in the periventricular white matter.   On hospital day 1 she was noted to have dense L hemiparesis.  Imaging revealed completed large R MCA stroke as well as indeterminate age cerebellar stroke.  Patient was transferred to intensive care unit for hypertonic saline and airway monitoring.  After discussion with family and patient regarding wishes after this event, all in agreement that baseline quality of life was poor and any worsening of this baseline quality was not in line with her wishes.  Therefore we transitioned goals of care to comfort and she passed peacefully with her daughter at bedside.  Pertinent Labs and Studies  Significant Diagnostic Studies [place] EKG SITE RITE  Result Date: 04/01/2022 If Site Rite image not attached, placement could not be confirmed due to current cardiac rhythm.  CT ANGIO HEAD NECK W WO CM (CODE STROKE)  Result Date: 04/01/2022 CLINICAL DATA:  Left-sided weakness and facial droop EXAM: CT ANGIOGRAPHY HEAD AND NECK TECHNIQUE: Multidetector CT imaging of the head and neck was performed using the standard protocol during bolus administration of intravenous contrast. Multiplanar CT image reconstructions and MIPs were obtained to evaluate the vascular anatomy. Carotid stenosis measurements (when applicable) are obtained utilizing NASCET criteria, using the distal internal carotid diameter as the denominator. RADIATION DOSE REDUCTION: This exam was performed according to the departmental dose-optimization program which includes automated exposure control, adjustment of the  mA and/or kV according to patient size and/or use of iterative reconstruction technique. CONTRAST:  120m OMNIPAQUE IOHEXOL 350 MG/ML SOLN COMPARISON:  No prior CTA available, correlation is made with  CT head 04/01/2022 FINDINGS: CT HEAD FINDINGS For noncontrast findings, please see same day CT head. CTA NECK FINDINGS Aortic arch: Three-vessel arch arch with a common origin of the brachiocephalic and left common carotid arteries in aortic origin the left vertebral artery. Imaged portion shows no evidence of aneurysm or dissection. No significant stenosis of the major arch vessel origins. Aortic atherosclerosis. Right carotid system: No evidence of dissection, occlusion, or hemodynamically significant stenosis (greater than 50%). Atherosclerotic disease at the bifurcation and in the proximal ICA is not hemodynamically significant. Left carotid system: No evidence of dissection, occlusion, or hemodynamically significant stenosis (greater than 50%). Atherosclerotic disease at the bifurcation and in the proximal ICA is not hemodynamically significant. Vertebral arteries: Right dominant system. No evidence of dissection, occlusion, or hemodynamically significant stenosis (greater than 50%). Skeleton: Negative. Other neck: Hypoenhancing nodules in the thyroid, measuring up to 1 cm, for which no follow-up is indicated. (Reference: J Am Coll Radiol. 2015 Feb;12(2): 143-50) Upper chest: Motion limited. No focal pulmonary opacity or pleural effusion. Review of the MIP images confirms the above findings CTA HEAD FINDINGS Anterior circulation: Both internal carotid arteries are patent to the termini, with calcifications but without significant stenosis. A1 segments patent. Normal anterior communicating artery. Anterior cerebral arteries are patent to their distal aspects. Occlusion or near occlusion of the distal right M1 (series 9, image 92), with possible minimal flow in downstream vessels, although evaluation is somewhat limited by degree of venous contamination. No left M1 occlusion or stenosis. Left MCA branches are perfused without significant stenosis. Posterior circulation: Vertebral arteries patent to the  vertebrobasilar junction without stenosis. Posterior inferior cerebellar arteries patent proximally. Basilar patent to its distal aspect. Superior cerebellar arteries patent proximally. Patent P1 segments. Near fetal origin of the right PCA from a more prominent right posterior communicating artery. The left posterior communicating artery is also patent but somewhat irregular. Mild focal stenosis in the proximal left P2 (series 9, image 99 and distal right P 2 (series 9, image 98). PCAs otherwise perfused to their distal aspects without stenosis. Venous sinuses: Patent. Anatomic variants: Near fetal origin of the right PCA. Review of the MIP images confirms the above findings IMPRESSION: 1. Occlusion or near occlusion of the distal right M1, with possible minimal flow in downstream vessels, although evaluation is somewhat limited by degree of venous contamination. 2. No hemodynamically significant stenosis in the neck. 3. Aortic atherosclerosis. Aortic Atherosclerosis (ICD10-I70.0). Imaging results were communicated on 04/01/2022 at 1:09 pm to provider Dr. KLeonel Ramsayvia secure text paging. Electronically Signed   By: AMerilyn BabaM.D.   On: 04/01/2022 13:20   CT HEAD CODE STROKE WO CONTRAST  Result Date: 04/01/2022 CLINICAL DATA:  Code stroke.  Left-sided weakness and facial droop EXAM: CT HEAD WITHOUT CONTRAST TECHNIQUE: Contiguous axial images were obtained from the base of the skull through the vertex without intravenous contrast. RADIATION DOSE REDUCTION: This exam was performed according to the departmental dose-optimization program which includes automated exposure control, adjustment of the mA and/or kV according to patient size and/or use of iterative reconstruction technique. COMPARISON:  03/15/2022 FINDINGS: Brain: Hypodensity and loss of gray-white differentiation with sulcal effacement throughout the right MCA territory, consistent with a right MCA territory infarct, which is new from the prior  exam. Cytotoxic edema with mild mass effect  on the right lateral ventricle. Small amount of hyperdensity within the infarct, possibly petechial hemorrhage. Additional hypodensity in the superior left cerebellum (series 5, image 57 and series 6, image 34) there may have been present on the prior exam, although evaluation of this area with limited by artifact. No mass, midline shift, hydrocephalus, or extra-axial collection. Vascular: No definite hyperdense vessel is seen. Skull: Negative for fracture or focal lesion. Sinuses/Orbits: No acute finding. Status post bilateral lens replacements. Other: The mastoids are well aerated. ASPECTS [facility] Stroke Program Early CT Score) - Ganglionic level infarction (caudate, lentiform nuclei, internal capsule, insula, M1-M3 cortex): 3 - Supraganglionic infarction (M4-M6 cortex): 0 Total score (0-10 with 10 being normal): 3 IMPRESSION: 1. Large right MCA territory infarct, with possible petechial hemorrhage. 2. Additional hypodensity in the superior left cerebellum, which may have been present on the prior exam, although evaluation of this area with limited by artifact. 3. ASPECTS is 3. Imaging results were communicated on 04/01/2022 at 12:58 pm to provider Dr. Leonel Ramsay via secure text paging. Electronically Signed   By: Merilyn Baba M.D.   On: 04/01/2022 12:59   CT ABDOMEN PELVIS WO CONTRAST  Result Date: 04/01/2022 CLINICAL DATA:  Nausea and vomiting for the past 24 hours. EXAM: CT ABDOMEN AND PELVIS WITHOUT CONTRAST TECHNIQUE: Multidetector CT imaging of the abdomen and pelvis was performed following the standard protocol without IV contrast. RADIATION DOSE REDUCTION: This exam was performed according to the departmental dose-optimization program which includes automated exposure control, adjustment of the mA and/or kV according to patient size and/or use of iterative reconstruction technique. COMPARISON:  CT abdomen pelvis dated Jul 21, 2019. FINDINGS: Lower chest:  Cardiomegaly. Unchanged 3.6 cm cyst at the right cardiophrenic angle. Mild scarring at the lung bases. No acute abnormality. Hepatobiliary: No focal liver abnormality is seen. Status post cholecystectomy. No biliary dilatation. Pancreas: Atrophic. No ductal dilatation or surrounding inflammatory changes. Spleen: Normal in size without focal abnormality. Adrenals/Urinary Tract: Adrenal glands are unremarkable. No renal mass, calculus, or hydronephrosis. The bladder is unremarkable. Stomach/Bowel: Stomach is within normal limits. Appendix appears normal. No evidence of bowel wall thickening, distention, or inflammatory changes. Small stool ball in the rectum. Vascular/Lymphatic: Aortic atherosclerosis. No enlarged abdominal or pelvic lymph nodes. Reproductive: Uterus and bilateral adnexa are unremarkable. Other: No free fluid or pneumoperitoneum. Musculoskeletal: No acute or significant osseous findings. Chronic T12, L1, and L2 compression deformities again noted. Prior thoracolumbar fusion. IMPRESSION: 1. No acute intra-abdominal process. 2. Small stool ball in the rectum. Correlate for fecal impaction. 3.  Aortic Atherosclerosis (ICD10-I70.0). Electronically Signed   By: Titus Dubin M.D.   On: 04/03/2022 18:36   DG Chest Portable 1 View  Result Date: 03/18/2022 CLINICAL DATA:  Weakness EXAM: PORTABLE CHEST 1 VIEW COMPARISON:  Two-view x-ray 07/04/2021 and older FINDINGS: Fixation hardware along the thoracolumbar spine. Calcified aorta. Enlarged cardiopericardial silhouette. No consolidation or pneumothorax. Mild linear opacity left lung base likely scar or atelectasis. Interstitial changes are seen, possibly chronic. Film is rotated to the left. Overlapping cardiac leads. IMPRESSION: Mild left basilar scar atelectasis. Enlarged heart with some interstitial changes. Acute versus chronic process but could be chronic. Recommend follow-up Electronically Signed   By: Jill Side M.D.   On: 04/07/2022 17:59    CT Cervical Spine Wo Contrast  Result Date: 04/04/2022 CLINICAL DATA:  Neck trauma EXAM: CT CERVICAL SPINE WITHOUT CONTRAST TECHNIQUE: Multidetector CT imaging of the cervical spine was performed without intravenous contrast. Multiplanar CT image reconstructions were also generated.  RADIATION DOSE REDUCTION: This exam was performed according to the departmental dose-optimization program which includes automated exposure control, adjustment of the mA and/or kV according to patient size and/or use of iterative reconstruction technique. COMPARISON:  None Available. FINDINGS: Alignment: No listhesis. Skull base and vertebrae: No acute fracture or suspicious osseous lesion. Osseous fusion across the left facets of C2 and C3, likely degenerative. Soft tissues and spinal canal: No prevertebral fluid or swelling. No visible canal hematoma. Disc levels: Mild degenerative changes throughout the cervical spine, with mild spinal canal stenosis at C6-C7. Multilevel uncovertebral and facet arthropathy, with severe right neural foraminal narrowing at C5-C6. Upper chest: No focal pulmonary opacity or pleural effusion. Other: None. IMPRESSION: No acute cervical spine fracture or traumatic listhesis. Electronically Signed   By: Merilyn Baba M.D.   On: 03/26/2022 14:57   CT Head Wo Contrast  Result Date: 03/13/2022 CLINICAL DATA:  Mental status change, unknown cause EXAM: CT HEAD WITHOUT CONTRAST TECHNIQUE: Contiguous axial images were obtained from the base of the skull through the vertex without intravenous contrast. RADIATION DOSE REDUCTION: This exam was performed according to the departmental dose-optimization program which includes automated exposure control, adjustment of the mA and/or kV according to patient size and/or use of iterative reconstruction technique. COMPARISON:  None Available. FINDINGS: Brain: There is periventricular white matter decreased attenuation consistent with small vessel ischemic changes.  Gray-white differentiation is preserved. No acute intracranial hemorrhage, mass effect or shift. No hydrocephalus. Vascular: No hyperdense vessel or unexpected calcification. Skull: Normal. Negative for fracture or focal lesion. Sinuses/Orbits: No acute finding. IMPRESSION: Periventricular white matter changes consistent with chronic small vessel ischemia. No acute intracranial process identified. Electronically Signed   By: Sammie Bench M.D.   On: 04/03/2022 14:51    Microbiology Recent Results (from the past 240 hour(s))  Urine Culture     Status: Abnormal (Preliminary result)   Collection Time: 03/30/2022  1:47 PM   Specimen: Urine, Clean Catch  Result Value Ref Range Status   Specimen Description   Final    URINE, CLEAN CATCH Performed at [facility], [place] [address], [place], [place] [address]    Special Requests   Final    NONE Performed at [facility], [place] [address] [place], [place] [address]    Culture (A)  Final    >=100,000 COLONIES/mL ESCHERICHIA COLI SUSCEPTIBILITIES TO FOLLOW Performed at [facility], [place] [address], [place], [place] [address]    Report Status PENDING  Incomplete  Resp panel by RT-PCR (RSV, Flu A&B, Covid) Anterior Nasal Swab     Status: None   Collection Time: 03/27/2022  7:34 PM   Specimen: Anterior Nasal Swab  Result Value Ref Range Status   SARS Coronavirus 2 by RT PCR NEGATIVE NEGATIVE Final    Comment: (NOTE) SARS-CoV-2 target nucleic acids are NOT DETECTED.  The SARS-CoV-2 RNA is generally detectable in upper respiratory specimens during the acute phase of infection. The lowest concentration of SARS-CoV-2 viral copies this assay can detect is 138 copies/mL. A negative result does not preclude SARS-Cov-2 infection and should not be used as the sole basis for treatment or other patient management decisions. A negative result may occur with  improper specimen collection/handling, submission of  specimen other than nasopharyngeal swab, presence of viral mutation(s) within the areas targeted by this assay, and inadequate number of viral copies(<138 copies/mL). A negative result must be combined with clinical observations, patient history, and epidemiological information. The expected result  is Negative.  Fact Sheet for Patients:  EntrepreneurPulse.com.au  Fact Sheet for Healthcare Providers:  IncredibleEmployment.be  This test is no t yet approved or cleared by the [place] FDA and  has been authorized for detection and/or diagnosis of SARS-CoV-2 by FDA under an Emergency Use Authorization (EUA). This EUA will remain  in effect (meaning this test can be used) for the duration of the COVID-19 declaration under Section 564(b)(1) of the Act, 21 U.S.C.section 360bbb-3(b)(1), unless the authorization is terminated  or revoked sooner.       Influenza A by PCR NEGATIVE NEGATIVE Final   Influenza B by PCR NEGATIVE NEGATIVE Final    Comment: (NOTE) The Xpert Xpress SARS-CoV-2/FLU/RSV plus assay is intended as an aid in the diagnosis of influenza from Nasopharyngeal swab specimens and should not be used as a sole basis for treatment. Nasal washings and aspirates are unacceptable for Xpert Xpress SARS-CoV-2/FLU/RSV testing.  Fact Sheet for Patients: EntrepreneurPulse.com.au  Fact Sheet for Healthcare Providers: IncredibleEmployment.be  This test is not yet approved or cleared by the [place] FDA and has been authorized for detection and/or diagnosis of SARS-CoV-2 by FDA under an Emergency Use Authorization (EUA). This EUA will remain in effect (meaning this test can be used) for the duration of the COVID-19 declaration under Section 564(b)(1) of the Act, 21 U.S.C. section 360bbb-3(b)(1), unless the authorization is terminated or revoked.     Resp Syncytial Virus by PCR NEGATIVE NEGATIVE Final     Comment: (NOTE) Fact Sheet for Patients: EntrepreneurPulse.com.au  Fact Sheet for Healthcare Providers: IncredibleEmployment.be  This test is not yet approved or cleared by the [place] FDA and has been authorized for detection and/or diagnosis of SARS-CoV-2 by FDA under an Emergency Use Authorization (EUA). This EUA will remain in effect (meaning this test can be used) for the duration of the COVID-19 declaration under Section 564(b)(1) of the Act, 21 U.S.C. section 360bbb-3(b)(1), unless the authorization is terminated or revoked.  Performed at [facility], [place] [address], [place], [place] [address]   Culture, blood (routine x 2)     Status: Abnormal (Preliminary result)   Collection Time: 03/30/2022  9:09 PM   Specimen: BLOOD  Result Value Ref Range Status   Specimen Description   Final    BLOOD RIGHT ANTECUBITAL Performed at [place] [address] [place], [place] [address]    Special Requests   Final    BOTTLES DRAWN AEROBIC AND ANAEROBIC Blood Culture adequate volume Performed at [place] [address], [place], [place] [address]    Culture  Setup Time   Final    GRAM POSITIVE COCCI IN CLUSTERS IN BOTH AEROBIC AND ANAEROBIC BOTTLES CRITICAL RESULT CALLED TO, READ BACK BY AND VERIFIED WITH: PHARMD M SWYNE [id number] AT 1142 BY CM    Culture (A)  Final    STAPHYLOCOCCUS AUREUS SUSCEPTIBILITIES TO FOLLOW Performed at [facility], [place] [address], [place], [place] [address]    Report Status PENDING  Incomplete  Blood Culture ID Panel (Reflexed)     Status: Abnormal   Collection Time: 04/09/2022  9:09 PM  Result Value Ref Range Status   Enterococcus faecalis NOT DETECTED NOT DETECTED Final   Enterococcus Faecium NOT DETECTED NOT DETECTED Final   Listeria monocytogenes NOT DETECTED NOT DETECTED Final   Staphylococcus species DETECTED (A) NOT DETECTED Final     Comment: CRITICAL RESULT CALLED TO, READ BACK BY AND VERIFIED WITH: PHARMD M  SWYNE [id number] AT 1142 BY CM    Staphylococcus aureus (BCID) DETECTED (A) NOT DETECTED Final    Comment: Methicillin (oxacillin)-resistant Staphylococcus aureus (MRSA). MRSA is predictably resistant to beta-lactam antibiotics (except ceftaroline). Preferred therapy is vancomycin unless clinically contraindicated. Patient requires contact precautions if  hospitalized. CRITICAL RESULT CALLED TO, READ BACK BY AND VERIFIED WITH: PHARMD M SWYNE [id number] AT 1142 BY CM    Staphylococcus epidermidis NOT DETECTED NOT DETECTED Final   Staphylococcus lugdunensis NOT DETECTED NOT DETECTED Final   Streptococcus species NOT DETECTED NOT DETECTED Final   Streptococcus agalactiae NOT DETECTED NOT DETECTED Final   Streptococcus pneumoniae NOT DETECTED NOT DETECTED Final   Streptococcus pyogenes NOT DETECTED NOT DETECTED Final   A.calcoaceticus-baumannii NOT DETECTED NOT DETECTED Final   Bacteroides fragilis NOT DETECTED NOT DETECTED Final   Enterobacterales NOT DETECTED NOT DETECTED Final   Enterobacter cloacae complex NOT DETECTED NOT DETECTED Final   Escherichia coli NOT DETECTED NOT DETECTED Final   Klebsiella aerogenes NOT DETECTED NOT DETECTED Final   Klebsiella oxytoca NOT DETECTED NOT DETECTED Final   Klebsiella pneumoniae NOT DETECTED NOT DETECTED Final   Proteus species NOT DETECTED NOT DETECTED Final   Salmonella species NOT DETECTED NOT DETECTED Final   Serratia marcescens NOT DETECTED NOT DETECTED Final   Haemophilus influenzae NOT DETECTED NOT DETECTED Final   Neisseria meningitidis NOT DETECTED NOT DETECTED Final   Pseudomonas aeruginosa NOT DETECTED NOT DETECTED Final   Stenotrophomonas maltophilia NOT DETECTED NOT DETECTED Final   Candida albicans NOT DETECTED NOT DETECTED Final   Candida auris NOT DETECTED NOT DETECTED Final   Candida glabrata NOT DETECTED NOT DETECTED Final   Candida krusei NOT DETECTED NOT  DETECTED Final   Candida parapsilosis NOT DETECTED NOT DETECTED Final   Candida tropicalis NOT DETECTED NOT DETECTED Final   Cryptococcus neoformans/gattii NOT DETECTED NOT DETECTED Final   Meth resistant mecA/C and MREJ DETECTED (A) NOT DETECTED Final    Comment: CRITICAL RESULT CALLED TO, READ BACK BY AND VERIFIED WITH: Fidela [place] [id number] AT 1142 BY CM Performed at [facility], [address], [place], [place] [address]   Culture, blood (routine x 2)     Status: Abnormal (Preliminary result)   Collection Time: 03/10/2022  9:11 PM   Specimen: BLOOD  Result Value Ref Range Status   Specimen Description   Final    BLOOD LEFT ANTECUBITAL Performed at [place] [address], [place], [place] [address]    Special Requests   Final    BOTTLES DRAWN AEROBIC AND ANAEROBIC Blood Culture adequate volume Performed at [place] [address], [place], [place] [address]    Culture  Setup Time   Final    GRAM POSITIVE COCCI IN CLUSTERS AEROBIC BOTTLE ONLY CRITICAL VALUE NOTED.  VALUE IS CONSISTENT WITH PREVIOUSLY REPORTED AND CALLED VALUE. Performed at [facility], [place] [address] [place], [place] [address]    Culture STAPHYLOCOCCUS AUREUS (A)  Final   Report Status PENDING  Incomplete  MRSA Next Gen by PCR, Nasal     Status: Abnormal   Collection Time: 04/01/22  2:30 PM   Specimen: Nasal Mucosa; Nasal Swab  Result Value Ref Range Status   MRSA by PCR Next Gen DETECTED (A) NOT DETECTED Final    Comment: (NOTE) The GeneXpert MRSA Assay (FDA approved for NASAL specimens only), is one component of a comprehensive MRSA colonization surveillance program. It is not intended to diagnose MRSA  infection nor to guide or monitor treatment for MRSA infections. Test performance is not FDA approved in patients less than 46 years old. Performed at [facility], [place] [address], [place], [place] [address]     Lab Basic  Metabolic Panel: Recent Labs  Lab 03/25/2022 1344 04/01/22 0458 04/01/22 1504  NA 136 137 140  K 3.6 3.9  --   CL 97* 101  --   CO2 25 24  --   GLUCOSE 177* 131*  --   BUN 51* 59*  --   CREATININE 2.19* 2.12*  --   CALCIUM 8.7* 8.2*  --    Liver Function Tests: Recent Labs  Lab 03/18/2022 1344 04/01/22 0458  AST 39 36  ALT 17 17  ALKPHOS 73 58  BILITOT 0.7 0.7  PROT 7.6 6.3*  ALBUMIN 3.1* 2.4*   No results for input(s): "LIPASE", "AMYLASE" in the last 168 hours. Recent Labs  Lab 03/14/2022 1345  AMMONIA 19   CBC: Recent Labs  Lab 03/22/2022 1344 04/01/22 0458  WBC 23.3* 24.7*  NEUTROABS 21.7* 22.2*  HGB 12.5 10.7*  HCT 38.9 32.6*  MCV 91.5 90.6  PLT 164 100*   Cardiac Enzymes: Recent Labs  Lab 03/25/2022 1344 04/01/22 0458  CKTOTAL 258* 344*   Sepsis Labs: Recent Labs  Lab 03/26/2022 1344 04/08/2022 2106 03/30/2022 2305 04/01/22 0458  WBC 23.3*  --   --  24.7*  LATICACIDVEN  --  2.4* 2.1*  --      [place] [place] 04-29-2022, 2:18 PM

## 2022-04-10 NOTE — Progress Notes (Signed)
RN called to patient room by family. Patient not breathing, no pulse, no heartbeat auscultated. MD notified. Patient's daughter present.

## 2022-04-10 DEATH — deceased

## 2022-04-28 ENCOUNTER — Ambulatory Visit: Payer: PPO | Admitting: Oncology

## 2023-10-14 NOTE — Telephone Encounter (Signed)
 err
